# Patient Record
Sex: Male | Born: 1945 | Race: White | Hispanic: No | Marital: Married | Smoking: Former smoker
Health system: Southern US, Community
[De-identification: ages and names within clinical notes are randomized; demographics above are authoritative.]

## PROBLEM LIST (undated history)

## (undated) DIAGNOSIS — M1990 Unspecified osteoarthritis, unspecified site: Secondary | ICD-10-CM

## (undated) DIAGNOSIS — N529 Male erectile dysfunction, unspecified: Secondary | ICD-10-CM

## (undated) DIAGNOSIS — F419 Anxiety disorder, unspecified: Secondary | ICD-10-CM

## (undated) DIAGNOSIS — I340 Nonrheumatic mitral (valve) insufficiency: Secondary | ICD-10-CM

## (undated) DIAGNOSIS — K219 Gastro-esophageal reflux disease without esophagitis: Secondary | ICD-10-CM

## (undated) DIAGNOSIS — J189 Pneumonia, unspecified organism: Secondary | ICD-10-CM

## (undated) DIAGNOSIS — D126 Benign neoplasm of colon, unspecified: Secondary | ICD-10-CM

## (undated) DIAGNOSIS — J939 Pneumothorax, unspecified: Secondary | ICD-10-CM

## (undated) DIAGNOSIS — I2510 Atherosclerotic heart disease of native coronary artery without angina pectoris: Secondary | ICD-10-CM

## (undated) DIAGNOSIS — Z9889 Other specified postprocedural states: Secondary | ICD-10-CM

## (undated) DIAGNOSIS — J449 Chronic obstructive pulmonary disease, unspecified: Secondary | ICD-10-CM

## (undated) DIAGNOSIS — G2581 Restless legs syndrome: Secondary | ICD-10-CM

## (undated) DIAGNOSIS — R112 Nausea with vomiting, unspecified: Secondary | ICD-10-CM

## (undated) DIAGNOSIS — E785 Hyperlipidemia, unspecified: Secondary | ICD-10-CM

## (undated) DIAGNOSIS — F4310 Post-traumatic stress disorder, unspecified: Secondary | ICD-10-CM

## (undated) DIAGNOSIS — F32A Depression, unspecified: Secondary | ICD-10-CM

## (undated) DIAGNOSIS — G4733 Obstructive sleep apnea (adult) (pediatric): Secondary | ICD-10-CM

## (undated) DIAGNOSIS — R972 Elevated prostate specific antigen [PSA]: Secondary | ICD-10-CM

## (undated) DIAGNOSIS — I10 Essential (primary) hypertension: Secondary | ICD-10-CM

## (undated) HISTORY — PX: CHOLECYSTECTOMY: SHX55

## (undated) HISTORY — DX: Benign neoplasm of colon, unspecified: D12.6

## (undated) HISTORY — DX: Gastro-esophageal reflux disease without esophagitis: K21.9

## (undated) HISTORY — DX: Restless legs syndrome: G25.81

## (undated) HISTORY — PX: MOHS SURGERY: SHX181

## (undated) HISTORY — DX: Male erectile dysfunction, unspecified: N52.9

## (undated) HISTORY — DX: Atherosclerotic heart disease of native coronary artery without angina pectoris: I25.10

## (undated) HISTORY — DX: Elevated prostate specific antigen (PSA): R97.20

## (undated) HISTORY — DX: Obstructive sleep apnea (adult) (pediatric): G47.33

## (undated) HISTORY — DX: Pneumothorax, unspecified: J93.9

## (undated) HISTORY — DX: Nonrheumatic mitral (valve) insufficiency: I34.0

## (undated) HISTORY — PX: OTHER SURGICAL HISTORY: SHX169

## (undated) HISTORY — DX: Essential (primary) hypertension: I10

## (undated) HISTORY — PX: ANKLE SURGERY: SHX546

## (undated) HISTORY — DX: Hyperlipidemia, unspecified: E78.5

---

## 1998-12-02 ENCOUNTER — Ambulatory Visit (HOSPITAL_BASED_OUTPATIENT_CLINIC_OR_DEPARTMENT_OTHER): Admission: RE | Admit: 1998-12-02 | Discharge: 1998-12-02 | Payer: Self-pay | Admitting: Urology

## 1999-02-16 ENCOUNTER — Ambulatory Visit (HOSPITAL_COMMUNITY): Admission: RE | Admit: 1999-02-16 | Discharge: 1999-02-16 | Payer: Self-pay | Admitting: Cardiovascular Disease

## 2002-09-02 ENCOUNTER — Ambulatory Visit (HOSPITAL_COMMUNITY): Admission: RE | Admit: 2002-09-02 | Discharge: 2002-09-02 | Payer: Self-pay | Admitting: Gastroenterology

## 2002-09-03 ENCOUNTER — Encounter: Payer: Self-pay | Admitting: Gastroenterology

## 2002-09-03 ENCOUNTER — Encounter: Admission: RE | Admit: 2002-09-03 | Discharge: 2002-09-03 | Payer: Self-pay | Admitting: Gastroenterology

## 2003-11-16 ENCOUNTER — Ambulatory Visit (HOSPITAL_COMMUNITY): Admission: RE | Admit: 2003-11-16 | Discharge: 2003-11-16 | Payer: Self-pay | Admitting: Orthopedic Surgery

## 2006-01-15 ENCOUNTER — Emergency Department (HOSPITAL_COMMUNITY): Admission: EM | Admit: 2006-01-15 | Discharge: 2006-01-16 | Payer: Self-pay | Admitting: Emergency Medicine

## 2006-07-19 ENCOUNTER — Observation Stay (HOSPITAL_COMMUNITY): Admission: EM | Admit: 2006-07-19 | Discharge: 2006-07-20 | Payer: Self-pay | Admitting: *Deleted

## 2011-12-13 ENCOUNTER — Other Ambulatory Visit: Payer: Self-pay | Admitting: Family Medicine

## 2011-12-13 DIAGNOSIS — R9389 Abnormal findings on diagnostic imaging of other specified body structures: Secondary | ICD-10-CM

## 2011-12-14 ENCOUNTER — Ambulatory Visit
Admission: RE | Admit: 2011-12-14 | Discharge: 2011-12-14 | Disposition: A | Discharge status: Home / Self Care | Payer: Medicare Other | Source: Ambulatory Visit | Attending: Family Medicine | Admitting: Family Medicine

## 2011-12-14 DIAGNOSIS — R9389 Abnormal findings on diagnostic imaging of other specified body structures: Secondary | ICD-10-CM

## 2011-12-14 DIAGNOSIS — R918 Other nonspecific abnormal finding of lung field: Secondary | ICD-10-CM | POA: Diagnosis not present

## 2011-12-14 DIAGNOSIS — J984 Other disorders of lung: Secondary | ICD-10-CM | POA: Diagnosis not present

## 2011-12-14 DIAGNOSIS — R599 Enlarged lymph nodes, unspecified: Secondary | ICD-10-CM | POA: Diagnosis not present

## 2012-01-29 DIAGNOSIS — R05 Cough: Secondary | ICD-10-CM | POA: Diagnosis not present

## 2012-01-30 DIAGNOSIS — N401 Enlarged prostate with lower urinary tract symptoms: Secondary | ICD-10-CM | POA: Diagnosis not present

## 2012-01-30 DIAGNOSIS — R972 Elevated prostate specific antigen [PSA]: Secondary | ICD-10-CM | POA: Diagnosis not present

## 2012-01-30 DIAGNOSIS — N529 Male erectile dysfunction, unspecified: Secondary | ICD-10-CM | POA: Diagnosis not present

## 2012-02-26 ENCOUNTER — Encounter: Payer: Self-pay | Admitting: Pulmonary Disease

## 2012-02-26 DIAGNOSIS — I10 Essential (primary) hypertension: Secondary | ICD-10-CM

## 2012-02-26 DIAGNOSIS — I340 Nonrheumatic mitral (valve) insufficiency: Secondary | ICD-10-CM | POA: Insufficient documentation

## 2012-02-26 DIAGNOSIS — I2510 Atherosclerotic heart disease of native coronary artery without angina pectoris: Secondary | ICD-10-CM | POA: Insufficient documentation

## 2012-02-26 DIAGNOSIS — E785 Hyperlipidemia, unspecified: Secondary | ICD-10-CM | POA: Insufficient documentation

## 2012-02-26 DIAGNOSIS — N529 Male erectile dysfunction, unspecified: Secondary | ICD-10-CM | POA: Insufficient documentation

## 2012-02-26 DIAGNOSIS — K219 Gastro-esophageal reflux disease without esophagitis: Secondary | ICD-10-CM | POA: Insufficient documentation

## 2012-02-26 HISTORY — DX: Essential (primary) hypertension: I10

## 2012-02-27 ENCOUNTER — Ambulatory Visit (INDEPENDENT_AMBULATORY_CARE_PROVIDER_SITE_OTHER): Payer: Medicare Other | Admitting: Pulmonary Disease

## 2012-02-27 ENCOUNTER — Encounter: Payer: Self-pay | Admitting: Pulmonary Disease

## 2012-02-27 VITALS — BP 116/70 | HR 61 | Temp 98.0°F | Ht 75.0 in | Wt 252.6 lb

## 2012-02-27 DIAGNOSIS — R9389 Abnormal findings on diagnostic imaging of other specified body structures: Secondary | ICD-10-CM | POA: Diagnosis not present

## 2012-02-27 DIAGNOSIS — I2510 Atherosclerotic heart disease of native coronary artery without angina pectoris: Secondary | ICD-10-CM | POA: Insufficient documentation

## 2012-02-27 DIAGNOSIS — R05 Cough: Secondary | ICD-10-CM | POA: Diagnosis not present

## 2012-02-27 DIAGNOSIS — R931 Abnormal findings on diagnostic imaging of heart and coronary circulation: Secondary | ICD-10-CM | POA: Insufficient documentation

## 2012-02-27 DIAGNOSIS — R053 Chronic cough: Secondary | ICD-10-CM | POA: Insufficient documentation

## 2012-02-27 NOTE — Patient Instructions (Signed)
Will schedule for followup ct chest in 3 weeks.  Will call you with results. Continue on chlorpheniramine 8mg  at bedtime and 4mg  at lunch for postnasal drip Stay on medication for reflux twice a day.  If the cough re-escalates, may need to evaluate sinuses and esophagus. Go ahead and schedule followup visit at your convenience to re-establish for your sleep apnea and restless legs.

## 2012-02-27 NOTE — Progress Notes (Signed)
  Subjective:    Patient ID: Gregory Suarez, male    DOB: [birth year], 66 y.o.   MRN: [id number]  HPI The patient is a 66 year old male who I then asked to see for an abnormal CT chest.  The patient has a history of chronic cough for one year, with a history that is very suggestive of postnasal drip and reflux disease.  He has been treated for these entities, and feels that his cough is better now than it has ever been.  He currently rates his cough a 2/10.  Patient currently has minimal mucus, and feels that it is coming from the back of his throat.  He denies any chest congestion or chest discomfort, and has had no hemoptysis.  He is eating well, and has had no weight loss.  He does have a history of reflux disease with regurgitation of food at night, but it is currently controlled on medications.  He denies any dysphasia.  He has seen no change in his breathing or exercise tolerance.  The patient did recently undergo a CT chest that shows very mild lymphadenopathy, as well as small patches of  basilar airspace disease medially in the lower lobes that appears to be more scarlike and possibly related to bronchiectasis.   Review of Systems  Constitutional: Negative.  Negative for fever and unexpected weight change.  HENT: Positive for ear pain, congestion and sneezing. Negative for nosebleeds, sore throat, rhinorrhea, trouble swallowing, dental problem, postnasal drip and sinus pressure.   Eyes: Negative.  Negative for redness and itching.  Respiratory: Positive for cough. Negative for chest tightness, shortness of breath and wheezing.   Cardiovascular: Positive for leg swelling. Negative for palpitations.  Gastrointestinal: Negative.  Negative for nausea and vomiting.  Genitourinary: Negative.  Negative for dysuria.  Musculoskeletal: Positive for arthralgias. Negative for joint swelling.  Skin: Positive for rash.  Neurological: Negative.  Negative for headaches.  Hematological: Negative.  Does not  bruise/bleed easily.  Psychiatric/Behavioral: Negative.  Negative for dysphoric mood. The patient is not nervous/anxious.        Objective:   Physical Exam Constitutional:  Well developed, no acute distress  HENT:  Nares patent without discharge  Oropharynx without exudate, palate and uvula are elongated  Eyes:  Perrla, eomi, no scleral icterus  Neck:  No JVD, no TMG  Cardiovascular:  Normal rate, regular rhythm, no rubs or gallops.  No murmurs        Intact distal pulses  Pulmonary :  Normal breath sounds, no stridor or respiratory distress   No rales, rhonchi, or wheezing  Abdominal:  Soft, nondistended, bowel sounds present.  No tenderness noted.   Musculoskeletal: 1+ lower extremity edema noted.  Lymph Nodes:  No cervical lymphadenopathy noted  Skin:  No cyanosis noted  Neurologic:  Alert, appropriate, moves all 4 extremities without obvious deficit.         Assessment & Plan:

## 2012-02-27 NOTE — Assessment & Plan Note (Signed)
The patient has a chronic cough that is most consistent with postnasal drip and reflux disease based on his history.  He feels that it is at least 80% improved on an antihistamine and b.i.d. Proton pump inhibitor, and I have asked him to continue on this.  If his cough escalates again, he may need further GI evaluation and possibly a CT of his sinuses to rule out chronic sinusitis.

## 2012-02-27 NOTE — Assessment & Plan Note (Signed)
The patient's chest CT shows minimal lymphadenopathy, and also small areas of air space disease deep in both bases medially.  This appears to be more scarring and possibly bronchiectasis than an active ongoing infection.  Chronic reflux/regurgitation could possibly lead to this process.  The patient denies any history for aspiration while eating or drinking, and has no swallowing difficulties.  At this point, I would not treat him any differently, and would do a followup CT scan at a 3 month interval from his prior scan.

## 2012-03-21 ENCOUNTER — Ambulatory Visit (INDEPENDENT_AMBULATORY_CARE_PROVIDER_SITE_OTHER)
Admission: RE | Admit: 2012-03-21 | Discharge: 2012-03-21 | Disposition: A | Discharge status: Home / Self Care | Payer: Medicare Other | Source: Ambulatory Visit | Attending: Pulmonary Disease | Admitting: Pulmonary Disease

## 2012-03-21 DIAGNOSIS — J984 Other disorders of lung: Secondary | ICD-10-CM | POA: Diagnosis not present

## 2012-03-21 DIAGNOSIS — R9389 Abnormal findings on diagnostic imaging of other specified body structures: Secondary | ICD-10-CM

## 2012-03-21 DIAGNOSIS — R918 Other nonspecific abnormal finding of lung field: Secondary | ICD-10-CM | POA: Diagnosis not present

## 2012-03-24 ENCOUNTER — Other Ambulatory Visit: Payer: Self-pay | Admitting: Pulmonary Disease

## 2012-03-24 DIAGNOSIS — R9389 Abnormal findings on diagnostic imaging of other specified body structures: Secondary | ICD-10-CM

## 2012-04-02 ENCOUNTER — Telehealth: Payer: Self-pay | Admitting: Pulmonary Disease

## 2012-04-02 NOTE — Telephone Encounter (Signed)
I spoke with spouse and is aware of results she voiced her understanding and had no questions. CT has been scheduled for 09/22/12 at 10:00

## 2012-04-02 NOTE — Telephone Encounter (Signed)
Notes Recorded by Barbaraann Share, MD on 03/24/2012 at 9:30 AM Please let pt know that his minimally enlarged lymph nodes are completely stable. His abnormalities in the bottom of both lungs have resolved. Good news. However, he now has a spot in the bottom of the left lung that is the size of 2 BB's together. This is probably scarring from his prior abnormality in this area, but it may have been the abnormality hiding it all along. He needs a followup ct chest in 6mos. I will put the order in. Let me know if any questions.   [id number] x1 for pt

## 2012-06-06 DIAGNOSIS — R609 Edema, unspecified: Secondary | ICD-10-CM | POA: Diagnosis not present

## 2012-06-06 DIAGNOSIS — E782 Mixed hyperlipidemia: Secondary | ICD-10-CM | POA: Diagnosis not present

## 2012-06-06 DIAGNOSIS — I2510 Atherosclerotic heart disease of native coronary artery without angina pectoris: Secondary | ICD-10-CM | POA: Diagnosis not present

## 2012-06-24 DIAGNOSIS — H2510 Age-related nuclear cataract, unspecified eye: Secondary | ICD-10-CM | POA: Diagnosis not present

## 2012-06-24 DIAGNOSIS — H35369 Drusen (degenerative) of macula, unspecified eye: Secondary | ICD-10-CM | POA: Diagnosis not present

## 2012-06-24 DIAGNOSIS — H35429 Microcystoid degeneration of retina, unspecified eye: Secondary | ICD-10-CM | POA: Diagnosis not present

## 2012-06-24 DIAGNOSIS — H33109 Unspecified retinoschisis, unspecified eye: Secondary | ICD-10-CM | POA: Diagnosis not present

## 2012-07-14 DIAGNOSIS — E782 Mixed hyperlipidemia: Secondary | ICD-10-CM | POA: Diagnosis not present

## 2012-07-14 DIAGNOSIS — I259 Chronic ischemic heart disease, unspecified: Secondary | ICD-10-CM | POA: Diagnosis not present

## 2012-07-14 DIAGNOSIS — I10 Essential (primary) hypertension: Secondary | ICD-10-CM | POA: Diagnosis not present

## 2012-07-22 DIAGNOSIS — E785 Hyperlipidemia, unspecified: Secondary | ICD-10-CM | POA: Diagnosis not present

## 2012-07-22 DIAGNOSIS — I10 Essential (primary) hypertension: Secondary | ICD-10-CM | POA: Diagnosis not present

## 2012-07-22 DIAGNOSIS — Z23 Encounter for immunization: Secondary | ICD-10-CM | POA: Diagnosis not present

## 2012-09-16 DIAGNOSIS — L821 Other seborrheic keratosis: Secondary | ICD-10-CM | POA: Diagnosis not present

## 2012-09-16 DIAGNOSIS — L301 Dyshidrosis [pompholyx]: Secondary | ICD-10-CM | POA: Diagnosis not present

## 2012-09-16 DIAGNOSIS — L820 Inflamed seborrheic keratosis: Secondary | ICD-10-CM | POA: Diagnosis not present

## 2012-09-16 DIAGNOSIS — L570 Actinic keratosis: Secondary | ICD-10-CM | POA: Diagnosis not present

## 2012-09-16 DIAGNOSIS — D1800 Hemangioma unspecified site: Secondary | ICD-10-CM | POA: Diagnosis not present

## 2012-09-16 DIAGNOSIS — L259 Unspecified contact dermatitis, unspecified cause: Secondary | ICD-10-CM | POA: Diagnosis not present

## 2012-09-22 ENCOUNTER — Ambulatory Visit (INDEPENDENT_AMBULATORY_CARE_PROVIDER_SITE_OTHER)
Admission: RE | Admit: 2012-09-22 | Discharge: 2012-09-22 | Disposition: A | Discharge status: Home / Self Care | Payer: Medicare Other | Source: Ambulatory Visit | Attending: Pulmonary Disease | Admitting: Pulmonary Disease

## 2012-09-22 DIAGNOSIS — R9389 Abnormal findings on diagnostic imaging of other specified body structures: Secondary | ICD-10-CM

## 2012-09-22 DIAGNOSIS — J984 Other disorders of lung: Secondary | ICD-10-CM | POA: Diagnosis not present

## 2012-09-22 DIAGNOSIS — J438 Other emphysema: Secondary | ICD-10-CM | POA: Diagnosis not present

## 2013-02-16 ENCOUNTER — Telehealth: Payer: Self-pay | Admitting: Family Medicine

## 2013-02-17 MED ORDER — PANTOPRAZOLE SODIUM 40 MG PO TBEC: 40.0000 mg | DELAYED_RELEASE_TABLET | Freq: Two times a day (BID) | ORAL | Status: DC

## 2013-02-17 NOTE — Telephone Encounter (Signed)
Med rf. Pt needs to be seen before next rf

## 2013-03-05 ENCOUNTER — Encounter: Payer: Self-pay | Admitting: Family Medicine

## 2013-03-05 ENCOUNTER — Ambulatory Visit (INDEPENDENT_AMBULATORY_CARE_PROVIDER_SITE_OTHER): Payer: Medicare Other | Admitting: Family Medicine

## 2013-03-05 VITALS — BP 100/70 | HR 60 | Temp 97.8°F | Resp 18 | Ht 75.0 in | Wt 240.0 lb

## 2013-03-05 DIAGNOSIS — R05 Cough: Secondary | ICD-10-CM | POA: Diagnosis not present

## 2013-03-05 DIAGNOSIS — C61 Malignant neoplasm of prostate: Secondary | ICD-10-CM | POA: Diagnosis not present

## 2013-03-05 DIAGNOSIS — E785 Hyperlipidemia, unspecified: Secondary | ICD-10-CM

## 2013-03-05 DIAGNOSIS — G2581 Restless legs syndrome: Secondary | ICD-10-CM

## 2013-03-05 DIAGNOSIS — R059 Cough, unspecified: Secondary | ICD-10-CM

## 2013-03-05 DIAGNOSIS — I10 Essential (primary) hypertension: Secondary | ICD-10-CM

## 2013-03-05 LAB — CBC WITH DIFFERENTIAL/PLATELET
Basophils Absolute: 0 10*3/uL (ref 0.0–0.1)
Basophils Relative: 0 % (ref 0–1)
Eosinophils Absolute: 0.3 10*3/uL (ref 0.0–0.7)
Eosinophils Relative: 4 % (ref 0–5)
HCT: 43.5 % (ref 39.0–52.0)
Hemoglobin: 14.9 g/dL (ref 13.0–17.0)
Lymphocytes Relative: 19 % (ref 12–46)
Lymphs Abs: 1.4 10*3/uL (ref 0.7–4.0)
MCH: 30.2 pg (ref 26.0–34.0)
MCHC: 34.3 g/dL (ref 30.0–36.0)
MCV: 88.2 fL (ref 78.0–100.0)
Monocytes Absolute: 0.7 10*3/uL (ref 0.1–1.0)
Monocytes Relative: 10 % (ref 3–12)
Neutro Abs: 4.8 10*3/uL (ref 1.7–7.7)
Neutrophils Relative %: 67 % (ref 43–77)
Platelets: 163 10*3/uL (ref 150–400)
RBC: 4.93 MIL/uL (ref 4.22–5.81)
RDW: 14.2 % (ref 11.5–15.5)
WBC: 7.2 10*3/uL (ref 4.0–10.5)

## 2013-03-05 LAB — COMPLETE METABOLIC PANEL WITH GFR
ALT: 22 U/L (ref 0–53)
AST: 24 U/L (ref 0–37)
Albumin: 3.8 g/dL (ref 3.5–5.2)
Alkaline Phosphatase: 113 U/L (ref 39–117)
BUN: 24 mg/dL — ABNORMAL HIGH (ref 6–23)
CO2: 27 mEq/L (ref 19–32)
Calcium: 9.2 mg/dL (ref 8.4–10.5)
Chloride: 107 mEq/L (ref 96–112)
Creat: 0.97 mg/dL (ref 0.50–1.35)
GFR, Est African American: >89 mL/min
GFR, Est Non African American: 81 mL/min
Glucose, Bld: 88 mg/dL (ref 70–99)
Potassium: 4.3 mEq/L (ref 3.5–5.3)
Sodium: 142 mEq/L (ref 135–145)
Total Bilirubin: 0.9 mg/dL (ref 0.3–1.2)
Total Protein: 6.3 g/dL (ref 6.0–8.3)

## 2013-03-05 LAB — LIPID PANEL
Cholesterol: 86 mg/dL (ref 0–200)
HDL: 34 mg/dL — ABNORMAL LOW (ref >39)
LDL Cholesterol: 36 mg/dL (ref 0–99)
Total CHOL/HDL Ratio: 2.5 Ratio
Triglycerides: 79 mg/dL (ref <150)
VLDL: 16 mg/dL (ref 0–40)

## 2013-03-05 LAB — PSA, MEDICARE: PSA: 1.13 ng/mL (ref <=4.00)

## 2013-03-05 MED ORDER — PANTOPRAZOLE SODIUM 40 MG PO TBEC: 40.0000 mg | DELAYED_RELEASE_TABLET | Freq: Two times a day (BID) | ORAL | Status: DC

## 2013-03-05 MED ORDER — METOPROLOL SUCCINATE ER 50 MG PO TB24: 50.0000 mg | ORAL_TABLET | Freq: Every day | ORAL | Status: DC

## 2013-03-05 NOTE — Addendum Note (Signed)
Addended by: WRAY, [place] on: 03/05/2013 09:02 AM   Modules accepted: Orders

## 2013-03-05 NOTE — Progress Notes (Signed)
Subjective:    Patient ID: Gregory Suarez, male    DOB: 19-May-1946, 67 y.o.   MRN: [id number]  HPI Patient is here for followup of his medical problems. Prominent on his hypertension. He is currently taking Toprol-XL 50 mg by mouth daily he denies any chest pain or shortness of breath. He denies any dyspnea on exertion. He also has a history of hyperlipidemia. Currently taking Lipitor 40 mg by mouth daily. He is overdue for fasting lipid panel. He denies any myalgias right upper quadrant pain. He is currently taking protonic 40 mg by mouth twice a day due to chronic cough is due to laryngo-tracheal reflux.  His cough has been completely controlled with a combination of allergy medicines as well as PPIs. Since I last saw the patient he has been diagnosed with restless leg syndrome. He is only taking ropinirole 1 mg by mouth each bedtime. This seems to control his symptoms well. He does have some mild nausea associated with it. His colonoscopy is up-to-date. He is no longer seeing his urologist. Therefore his PSA is due. Past Medical History  Diagnosis Date  . Hypertension   . Hyperlipidemia   . GERD (gastroesophageal reflux disease)   . CAD (coronary artery disease)   . Erectile dysfunction   . Mitral valve regurgitation   . Elevated PSA   . RLS (restless legs syndrome)    Current Outpatient Prescriptions on File Prior to Visit  Medication Sig Dispense Refill  . aspirin 325 MG tablet Take 325 mg by mouth daily.      Marland Kitchen atorvastatin (LIPITOR) 40 MG tablet Take 40 mg by mouth daily.      . chlorpheniramine (ALLERGY) 4 MG tablet Take 4 mg by mouth daily as needed.      . metoprolol (LOPRESSOR) 50 MG tablet Take 50 mg by mouth daily.      . Probiotic Product (PROBIOTIC FORMULA PO) Take 1 tablet by mouth daily.      . tadalafil (CIALIS) 5 MG tablet Take 5 mg by mouth daily.      . nitroGLYCERIN (NITROSTAT) 0.4 MG SL tablet Place 0.4 mg under the tongue every 5 (five) minutes as needed.       No  current facility-administered medications on file prior to visit.   No Known Allergies History   Social History  . Marital Status: Married    Spouse Name: N/A    Number of Children: N/A  . Years of Education: N/A   Occupational History  . RETIRED    Social History Main Topics  . Smoking status: Former Smoker -- 0.50 packs/day for 15 years    Types: Cigarettes    Quit date: 10/01/1988  . Smokeless tobacco: Not on file  . Alcohol Use: No  . Drug Use: No  . Sexually Active: Not on file   Other Topics Concern  . Not on file   Social History Narrative  . No narrative on file      Review of Systems  All other systems reviewed and are negative.       Objective:   Physical Exam  Vitals reviewed. Neck: Neck supple. No thyromegaly present.  Cardiovascular: Normal rate, regular rhythm, normal heart sounds and intact distal pulses.   No murmur heard. Pulmonary/Chest: Effort normal and breath sounds normal. No respiratory distress. He has no wheezes. He has no rales. He exhibits no tenderness.  Abdominal: Soft. Bowel sounds are normal. He exhibits no distension. There is no tenderness. There is no  rebound and no guarding.  Lymphadenopathy:    He has no cervical adenopathy.          Assessment & Plan:  1. HTN (hypertension) Blood pressure is well controlled. Continue current medications at current dose. - COMPLETE METABOLIC PANEL WITH GFR - Lipid panel - CBC with Differential  2. Prostate cancer The patient does not have a history of prostate cancer however we are due to check his screening PSA. He does have a history of elevated PSAs in the past.  3. Hyperlipidemia Given his history of coronary artery disease, his goal LDL is less than 70. Check a fasting lipid panel.  4. Chronic cough Continue protonic 40 mg by mouth twice a day.  Cough is currently well controlled.  5. Hypertension  6. RLS (restless legs syndrome) Continue ropinirole 1 mg by mouth q.  HS.

## 2013-06-21 ENCOUNTER — Telehealth: Payer: Self-pay | Admitting: *Deleted

## 2013-06-21 NOTE — Telephone Encounter (Signed)
Faxed CPAP supply order back. 

## 2013-07-01 ENCOUNTER — Other Ambulatory Visit: Payer: Self-pay | Admitting: Family Medicine

## 2013-07-14 ENCOUNTER — Ambulatory Visit: Payer: Medicare Other | Admitting: Cardiovascular Disease

## 2013-07-27 ENCOUNTER — Telehealth: Payer: Self-pay | Admitting: *Deleted

## 2013-07-27 NOTE — Telephone Encounter (Signed)
Faxed signed prescription for replacement humidifier chamber.

## 2013-08-10 ENCOUNTER — Ambulatory Visit: Payer: Medicare Other | Admitting: Cardiovascular Disease

## 2013-08-31 ENCOUNTER — Other Ambulatory Visit: Payer: Self-pay | Admitting: Family Medicine

## 2013-09-15 ENCOUNTER — Telehealth: Payer: Self-pay | Admitting: Family Medicine

## 2013-09-15 NOTE — Telephone Encounter (Signed)
Pt wife called and left a voicemail early this morning stating that he is needing a refill on his Cialis. He is in [place] and is needing a refill, she states that it helps him pee. Call back number is [phone]

## 2013-09-16 MED ORDER — TADALAFIL 5 MG PO TABS: 5.0000 mg | ORAL_TABLET | Freq: Every day | ORAL | Status: DC

## 2013-09-16 NOTE — Telephone Encounter (Signed)
Rx Refilled  

## 2013-09-21 ENCOUNTER — Other Ambulatory Visit: Payer: Self-pay | Admitting: Family Medicine

## 2014-01-05 ENCOUNTER — Other Ambulatory Visit: Payer: Self-pay | Admitting: Family Medicine

## 2014-01-18 ENCOUNTER — Other Ambulatory Visit: Payer: Medicare Other

## 2014-01-18 DIAGNOSIS — Z79899 Other long term (current) drug therapy: Secondary | ICD-10-CM | POA: Diagnosis not present

## 2014-01-18 DIAGNOSIS — E785 Hyperlipidemia, unspecified: Secondary | ICD-10-CM | POA: Diagnosis not present

## 2014-01-18 DIAGNOSIS — Z0000 Encounter for general adult medical examination without abnormal findings: Secondary | ICD-10-CM | POA: Diagnosis not present

## 2014-01-18 DIAGNOSIS — I25810 Atherosclerosis of coronary artery bypass graft(s) without angina pectoris: Secondary | ICD-10-CM

## 2014-01-18 DIAGNOSIS — I10 Essential (primary) hypertension: Secondary | ICD-10-CM

## 2014-01-18 LAB — CBC WITH DIFFERENTIAL/PLATELET
Basophils Absolute: 0.1 10*3/uL (ref 0.0–0.1)
Basophils Relative: 1 % (ref 0–1)
Eosinophils Absolute: 0.3 10*3/uL (ref 0.0–0.7)
Eosinophils Relative: 4 % (ref 0–5)
HCT: 44.2 % (ref 39.0–52.0)
Hemoglobin: 15.1 g/dL (ref 13.0–17.0)
Lymphocytes Relative: 21 % (ref 12–46)
Lymphs Abs: 1.4 10*3/uL (ref 0.7–4.0)
MCH: 29.3 pg (ref 26.0–34.0)
MCHC: 34.2 g/dL (ref 30.0–36.0)
MCV: 85.8 fL (ref 78.0–100.0)
Monocytes Absolute: 0.5 10*3/uL (ref 0.1–1.0)
Monocytes Relative: 8 % (ref 3–12)
Neutro Abs: 4.4 10*3/uL (ref 1.7–7.7)
Neutrophils Relative %: 66 % (ref 43–77)
Platelets: 177 10*3/uL (ref 150–400)
RBC: 5.15 MIL/uL (ref 4.22–5.81)
RDW: 14.8 % (ref 11.5–15.5)
WBC: 6.6 10*3/uL (ref 4.0–10.5)

## 2014-01-18 LAB — COMPREHENSIVE METABOLIC PANEL
ALT: 18 U/L (ref 0–53)
AST: 20 U/L (ref 0–37)
Albumin: 3.6 g/dL (ref 3.5–5.2)
Alkaline Phosphatase: 111 U/L (ref 39–117)
BUN: 18 mg/dL (ref 6–23)
CO2: 28 mEq/L (ref 19–32)
Calcium: 8.8 mg/dL (ref 8.4–10.5)
Chloride: 105 mEq/L (ref 96–112)
Creat: 0.91 mg/dL (ref 0.50–1.35)
Glucose, Bld: 86 mg/dL (ref 70–99)
Potassium: 4.5 mEq/L (ref 3.5–5.3)
Sodium: 142 mEq/L (ref 135–145)
Total Bilirubin: 0.6 mg/dL (ref 0.2–1.2)
Total Protein: 6.5 g/dL (ref 6.0–8.3)

## 2014-01-18 LAB — LIPID PANEL
Cholesterol: 107 mg/dL (ref 0–200)
HDL: 36 mg/dL — ABNORMAL LOW (ref >39)
LDL Cholesterol: 52 mg/dL (ref 0–99)
Total CHOL/HDL Ratio: 3 Ratio
Triglycerides: 96 mg/dL (ref <150)
VLDL: 19 mg/dL (ref 0–40)

## 2014-01-18 LAB — TSH: TSH: 2.106 u[IU]/mL (ref 0.350–4.500)

## 2014-01-21 ENCOUNTER — Encounter: Payer: Self-pay | Admitting: Family Medicine

## 2014-01-21 ENCOUNTER — Ambulatory Visit (INDEPENDENT_AMBULATORY_CARE_PROVIDER_SITE_OTHER): Payer: Medicare Other | Admitting: Family Medicine

## 2014-01-21 VITALS — BP 106/62 | HR 76 | Temp 97.7°F | Resp 18 | Ht 75.0 in | Wt 254.0 lb

## 2014-01-21 DIAGNOSIS — M549 Dorsalgia, unspecified: Secondary | ICD-10-CM | POA: Diagnosis not present

## 2014-01-21 DIAGNOSIS — Z0000 Encounter for general adult medical examination without abnormal findings: Secondary | ICD-10-CM

## 2014-01-21 MED ORDER — CYCLOBENZAPRINE HCL 10 MG PO TABS: 10.0000 mg | ORAL_TABLET | Freq: Three times a day (TID) | ORAL | Status: DC | PRN

## 2014-01-21 MED ORDER — PREDNISONE 20 MG PO TABS: ORAL_TABLET | ORAL | Status: DC

## 2014-01-21 MED ORDER — TIZANIDINE HCL 4 MG PO TABS: 4.0000 mg | ORAL_TABLET | Freq: Three times a day (TID) | ORAL | Status: DC | PRN

## 2014-01-21 NOTE — Progress Notes (Signed)
Subjective:    Patient ID: Gregory Suarez, male    DOB: 10/13/1945, 68 y.o.   MRN: [id number]  HPI Patient is here today for his Medicare physical.  He also complains of low back pain in the center of his back. He states he twisted his back a few weeks ago running the back of his car. Ever since he had a dark constant ache and stiffness in his lower back. He also occasionally has burning pain radiating into his right posterior thigh. He denies any weakness in his legs. He is extremely stiff today on examination. He was tried, and abdomen with minimal relief. He has no symptoms of cauda equina syndrome. Subjective:   Patient presents for Medicare Annual/Subsequent preventive examination.   Review Past Medical/Family/Social: Past Medical History  Diagnosis Date  . Hypertension   . Hyperlipidemia   . GERD (gastroesophageal reflux disease)   . CAD (coronary artery disease)   . Erectile dysfunction   . Mitral valve regurgitation   . Elevated PSA   . RLS (restless legs syndrome)    Past Surgical History  Procedure Laterality Date  . Ankle surgery    . Cholecystectomy    . Cholecystectomy     Current Outpatient Prescriptions on File Prior to Visit  Medication Sig Dispense Refill  . aspirin 325 MG tablet Take 325 mg by mouth daily.      Marland Kitchen atorvastatin (LIPITOR) 40 MG tablet TAKE 1 TABLET BY MOUTH EVERY NIGHT AT BEDTIME  90 tablet  1  . chlorpheniramine (ALLERGY) 4 MG tablet Take 4 mg by mouth daily as needed.      . metoprolol succinate (TOPROL-XL) 50 MG 24 hr tablet Take 1 tablet (50 mg total) by mouth daily. Take with or immediately following a meal.  90 tablet  3  . nitroGLYCERIN (NITROSTAT) 0.4 MG SL tablet Place 0.4 mg under the tongue every 5 (five) minutes as needed.      . pantoprazole (PROTONIX) 40 MG tablet Take 1 tablet (40 mg total) by mouth 2 (two) times daily.  180 tablet  3  . Probiotic Product (PROBIOTIC FORMULA PO) Take 1 tablet by mouth daily.      Marland Kitchen rOPINIRole (REQUIP)  1 MG tablet TAKE 1 TABLET BY MOUTH EVERY NIGHT AT BEDTIME  90 tablet  3  . tadalafil (CIALIS) 5 MG tablet Take 1 tablet (5 mg total) by mouth daily.  90 tablet  4   No current facility-administered medications on file prior to visit.   No Known Allergies History   Social History  . Marital Status: Married    Spouse Name: N/A    Number of Children: N/A  . Years of Education: N/A   Occupational History  . RETIRED    Social History Main Topics  . Smoking status: Former Smoker -- 0.50 packs/day for 15 years    Types: Cigarettes    Quit date: 10/01/1988  . Smokeless tobacco: Not on file  . Alcohol Use: No  . Drug Use: No  . Sexual Activity: Yes     Comment: married, 2 grown sons, retired maintenance man   Other Topics Concern  . Not on file   Social History Narrative  . No narrative on file   Family History  Problem Relation Age of Onset  . Diabetes    . Breast cancer    . Coronary artery disease       Risk Factors  Current exercise habits: none, increase to 30 min day/5 days  a week. Dietary issues discussed: decrease carbs  Cardiac risk factors: Obesity (BMI >= 30 kg/m2).   Depression Screen  (Note: if answer to either of the following is "Yes", a more complete depression screening is indicated)  Over the past two weeks, have you felt down, depressed or hopeless? No Over the past two weeks, have you felt little interest or pleasure in doing things? No Have you lost interest or pleasure in daily life? No Do you often feel hopeless? No Do you cry easily over simple problems? No   Activities of Daily Living  In your present state of health, do you have any difficulty performing the following activities?:  Driving? No  Managing money? No  Feeding yourself? No  Getting from bed to chair? No  Climbing a flight of stairs? No  Preparing food and eating?: No  Bathing or showering? No  Getting dressed: No  Getting to the toilet? No  Using the toilet:No  Moving  around from place to place: No  In the past year have you fallen or had a near fall?:No  Are you sexually active? Yes Do you have more than one partner? No   Hearing Difficulties: No  Do you often ask people to speak up or repeat themselves? No  Do you experience ringing or noises in your ears? No Do you have difficulty understanding soft or whispered voices? No  Do you feel that you have a problem with memory? No Do you often misplace items? No  Do you feel safe at home? Yes  Cognitive Testing  Alert? Yes Normal Appearance?Yes  Oriented to person? Yes Place? Yes  Time? Yes  Recall of three objects? Yes  Can perform simple calculations? Yes  Displays appropriate judgment?Yes  Can read the correct time from a watch face?Yes   List the Names of Other Physician/Practitioners you currently use:    Indicate any recent Medical Services you may have received from other than [facility] providers in the past year (date may be approximate).   Screening Tests / Date Colonoscopy   last colonoscopy was in 2008. It is not due again until 2018                  Zostavax patient has Zostavax in 20 Influenza Vaccine is due this fall Tetanus/tdap 2010  Patient has had Pneumovax 23 and Prevnar 13 through the [place]     Assessment:    Annual wellness medicare exam   Plan:    During the course of the visit the patient was educated and counseled about appropriate screening and preventive services including:  Screening mammography  Colorectal cancer screening  Shingles vaccine. Prescription given to that she can get the vaccine at the pharmacy or Medicare part D.  Screen + for depression. PHQ- 9 score of 12 (moderate depression). We discussed the options of counseling versus possibly a medication. I encouraged her strongly think about the counseling. She is going through some medical problems currently and her husband is as well Mrs. been very stressful for her. She says she will think about it. She  does have Xanax to use as needed. Though she may benefit from an SSRI for her more depressive type symptoms but she wants to hold off at this time.  I aksed her to please have her cardioloist send records since we have none on file.  Diet review for nutrition referral? Yes ____ Not Indicated __x__  Patient Instructions (the written plan) was given to the patient.  Medicare Attestation  I have personally reviewed:  The patient's medical and social history  Their use of alcohol, tobacco or illicit drugs  Their current medications and supplements  The patient's functional ability including ADLs,fall risks, home safety risks, cognitive, and hearing and visual impairment  Diet and physical activities  Evidence for depression or mood disorders  The patient's weight, height, BMI, and visual acuity have been recorded in the chart. I have made referrals, counseling, and provided education to the patient based on review of the above and I have provided the patient with a written personalized care plan for preventive services.        Review of Systems  All other systems reviewed and are negative.      Objective:   Physical Exam  Vitals reviewed. Constitutional: He is oriented to person, place, and time. He appears well-developed and well-nourished. No distress.  HENT:  Head: Normocephalic and atraumatic.  Right Ear: External ear normal.  Left Ear: External ear normal.  Nose: Nose normal.  Mouth/Throat: Oropharynx is clear and moist. No oropharyngeal exudate.  Eyes: Conjunctivae and EOM are normal. Pupils are equal, round, and reactive to light. Right eye exhibits no discharge. Left eye exhibits no discharge. No scleral icterus.  Neck: Normal range of motion. Neck supple. No JVD present. No tracheal deviation present. No thyromegaly present.  Cardiovascular: Normal rate, regular rhythm, normal heart sounds and intact distal pulses.  Exam reveals no gallop and no friction rub.   No murmur  heard. Pulmonary/Chest: Effort normal and breath sounds normal. No stridor. No respiratory distress. He has no wheezes. He has no rales. He exhibits no tenderness.  Abdominal: Soft. Bowel sounds are normal. He exhibits no distension and no mass. There is no tenderness. There is no rebound and no guarding.  Musculoskeletal: Normal range of motion. He exhibits tenderness. He exhibits no edema.  Lymphadenopathy:    He has no cervical adenopathy.  Neurological: He is alert and oriented to person, place, and time. He has normal reflexes. He displays normal reflexes. No cranial nerve deficit. He exhibits normal muscle tone. Coordination normal.  Skin: Skin is warm. No rash noted. He is not diaphoretic. No erythema. No pallor.  Psychiatric: He has a normal mood and affect. His behavior is normal. Judgment and thought content normal.   patient has decreased range of motion in his lumbar spine and has palpable muscle spasms in that area. He also has a 1 cm actinic keratosis on his right temple.        Assessment & Plan:  1. Routine general medical examination at a health care facility Physical exam is up to date. I recommended increasing aerobic exercise along with reduction in calories and carbohydrates his weight. I reviewed his most recent lab work which is listed below: Lab on 01/18/2014  Component Date Value Ref Range Status  . Cholesterol 01/18/2014 107  0 - 200 mg/dL Final   Comment: ATP III Classification:                                < 200        mg/dL        Desirable                               200 - 239     mg/dL  Borderline High                               >= 240        mg/dL        High                             . Triglycerides 01/18/2014 96  <150 mg/dL Final  . HDL 01/18/2014 36* >39 mg/dL Final  . Total CHOL/HDL Ratio 01/18/2014 3.0   Final  . VLDL 01/18/2014 19  0 - 40 mg/dL Final  . LDL Cholesterol 01/18/2014 52  0 - 99 mg/dL Final   Comment:                             Total Cholesterol/HDL Ratio:CHD Risk                                                 Coronary Heart Disease Risk Table                                                                 Men       Women                                   1/2 Average Risk              3.4        3.3                                       Average Risk              5.0        4.4                                    2X Average Risk              9.6        7.1                                    3X Average Risk             23.4       11.0                          Use the calculated Patient Ratio above and the CHD Risk table                           to determine the patient's CHD Risk.  ATP III Classification (LDL):                                < 100        mg/dL         Optimal                               100 - 129     mg/dL         Near or Above Optimal                               130 - 159     mg/dL         Borderline High                               160 - 189     mg/dL         High                                > 190        mg/dL         Very High                             . TSH 01/18/2014 2.106  0.350 - 4.500 uIU/mL Final  . WBC 01/18/2014 6.6  4.0 - 10.5 K/uL Final  . RBC 01/18/2014 5.15  4.22 - 5.81 MIL/uL Final  . Hemoglobin 01/18/2014 15.1  13.0 - 17.0 g/dL Final  . HCT 01/18/2014 44.2  39.0 - 52.0 % Final  . MCV 01/18/2014 85.8  78.0 - 100.0 fL Final  . MCH 01/18/2014 29.3  26.0 - 34.0 pg Final  . MCHC 01/18/2014 34.2  30.0 - 36.0 g/dL Final  . RDW 01/18/2014 14.8  11.5 - 15.5 % Final  . Platelets 01/18/2014 177  150 - 400 K/uL Final  . Neutrophils Relative % 01/18/2014 66  43 - 77 % Final  . Neutro Abs 01/18/2014 4.4  1.7 - 7.7 K/uL Final  . Lymphocytes Relative 01/18/2014 21  12 - 46 % Final  . Lymphs Abs 01/18/2014 1.4  0.7 - 4.0 K/uL Final  . Monocytes Relative 01/18/2014 8  3 - 12 % Final  . Monocytes Absolute 01/18/2014 0.5  0.1 - 1.0 K/uL Final  .  Eosinophils Relative 01/18/2014 4  0 - 5 % Final  . Eosinophils Absolute 01/18/2014 0.3  0.0 - 0.7 K/uL Final  . Basophils Relative 01/18/2014 1  0 - 1 % Final  . Basophils Absolute 01/18/2014 0.1  0.0 - 0.1 K/uL Final  . Smear Review 01/18/2014 Criteria for review not met   Final  . Sodium 01/18/2014 142  135 - 145 mEq/L Final  . Potassium 01/18/2014 4.5  3.5 - 5.3 mEq/L Final  . Chloride 01/18/2014 105  96 - 112 mEq/L Final  . CO2 01/18/2014 28  19 - 32 mEq/L Final  . Glucose, Bld 01/18/2014 86  70 - 99 mg/dL Final  . BUN 01/18/2014 18  6 - 23 mg/dL Final  . Creat 01/18/2014 0.91  0.50 - 1.35 mg/dL Final  .  Total Bilirubin 01/18/2014 0.6  0.2 - 1.2 mg/dL Final  . Alkaline Phosphatase 01/18/2014 111  39 - 117 U/L Final  . AST 01/18/2014 20  0 - 37 U/L Final  . ALT 01/18/2014 18  0 - 53 U/L Final  . Total Protein 01/18/2014 6.5  6.0 - 8.3 g/dL Final  . Albumin 01/18/2014 3.6  3.5 - 5.2 g/dL Final  . Calcium 01/18/2014 8.8  8.4 - 10.5 mg/dL Final   The only significant finding is low HDL which should be addressed by increasing his aerobic exercise. His immunizations are up to date. His cancer screening is up to date. He would be good for PSA after June 5.  He can return here for that  2. Back pain Patient may have a mild herniated this along with muscle spasms. He can take a prednisone taper pack of herniated disc. He can use Flexeril 5 mg to 10 mg every hours as needed for muscle spasm. - predniSONE (DELTASONE) 20 MG tablet; 3 tabs poqday 1-2, 2 tabs poqday 3-4, 1 tab poqday 5-6  Dispense: 12 tablet; Refill: 0

## 2014-02-23 ENCOUNTER — Other Ambulatory Visit: Payer: Self-pay | Admitting: Family Medicine

## 2014-02-23 NOTE — Telephone Encounter (Signed)
Refill appropriate and filled per protocol. 

## 2014-03-16 ENCOUNTER — Telehealth: Payer: Self-pay | Admitting: Family Medicine

## 2014-03-16 NOTE — Telephone Encounter (Signed)
Have him try Crestor 10mg , there has been less cramping and aches with this compared to lipitor, samples on your desk for him to try for 1 month and see how he does

## 2014-03-16 NOTE — Telephone Encounter (Signed)
Pt states that his legs feel better after stopping the cholesterol medication and wants to know what you want him to take/try now for his cholesterol??? (pt is aware you are OOT and it will be next week before I return his call).

## 2014-03-16 NOTE — Telephone Encounter (Signed)
Message copied by Alyson Locket on Tue Mar 16, 2014 12:44 PM ------      Message from: Devoria Glassing      Created: Tue Mar 16, 2014  9:29 AM       Patient would like to talk to you about his cholesterol medication       Please call him at [phone]  ------

## 2014-03-16 NOTE — Telephone Encounter (Signed)
Patient aware and samples left up front

## 2014-03-24 ENCOUNTER — Telehealth: Payer: Self-pay | Admitting: Family Medicine

## 2014-03-24 MED ORDER — PANTOPRAZOLE SODIUM 40 MG PO TBEC: 40.0000 mg | DELAYED_RELEASE_TABLET | Freq: Two times a day (BID) | ORAL | Status: DC

## 2014-03-24 NOTE — Telephone Encounter (Signed)
Message copied by Alyson Locket on Wed Mar 24, 2014 10:17 AM ------      Message from: Gregory Suarez      Created: Tue Mar 23, 2014  4:45 PM       Patient is calling to speak with you about his crestor       [phone] and also needs a refill on his pantoprazole  ------

## 2014-03-24 NOTE — Telephone Encounter (Signed)
Called pt and he states that the Crestor is worse then the Lipitor as far as the leg, feet and muscle pain and would like to know what else he could take? He has tried several other statins and they all cause this problem for him. I told him to DC Crestor and would give him a call back tomorrow.

## 2014-03-25 MED ORDER — PRAVASTATIN SODIUM 40 MG PO TABS: 40.0000 mg | ORAL_TABLET | Freq: Every day | ORAL | Status: DC

## 2014-03-25 NOTE — Telephone Encounter (Signed)
Call placed to patient.   States that he has been on Pravastatin before (from Dr. Claiborne Billings, cardiology), and he does not remember having any side effects with it.   States that he would like to try the Pravastatin before moving on to Zetia.   Prescription sent to pharmacy.

## 2014-03-25 NOTE — Telephone Encounter (Signed)
Has he tried pravastatin 40 mg poqday?  Try that or if he does not want a statin, he could try zetia 10 mg poqday.

## 2014-05-26 ENCOUNTER — Other Ambulatory Visit: Payer: Self-pay | Admitting: Family Medicine

## 2014-06-01 ENCOUNTER — Ambulatory Visit (INDEPENDENT_AMBULATORY_CARE_PROVIDER_SITE_OTHER): Payer: Medicare Other | Admitting: Family Medicine

## 2014-06-01 ENCOUNTER — Encounter: Payer: Self-pay | Admitting: Family Medicine

## 2014-06-01 VITALS — BP 110/60 | HR 84 | Temp 98.7°F | Resp 18 | Ht 75.0 in | Wt 254.0 lb

## 2014-06-01 DIAGNOSIS — I25810 Atherosclerosis of coronary artery bypass graft(s) without angina pectoris: Secondary | ICD-10-CM

## 2014-06-01 DIAGNOSIS — J029 Acute pharyngitis, unspecified: Secondary | ICD-10-CM | POA: Diagnosis not present

## 2014-06-01 LAB — RAPID STREP SCREEN (MED CTR MEBANE ONLY): Streptococcus, Group A Screen (Direct): POSITIVE — AB

## 2014-06-01 MED ORDER — AMOXICILLIN 875 MG PO TABS: 875.0000 mg | ORAL_TABLET | Freq: Two times a day (BID) | ORAL | Status: DC

## 2014-06-01 NOTE — Progress Notes (Signed)
Subjective:    Patient ID: Gregory Suarez, male    DOB: 04-21-1946, 68 y.o.   MRN: [id number]  HPI Patient symptoms began Friday with a dull headache and a sore throat. Since that time his has worsened. He has noticed white exudate on the back of his throat and posterior oropharynx. He also has tender cervical lymphadenopathy. He denies any rhinorrhea. He denies any cough. He does report diffuse body aches and right-sided ear pain. Examination of his right ear is completely normal. I believe this is being referred from the pain in his throat. Past Medical History  Diagnosis Date  . Hypertension   . Hyperlipidemia   . GERD (gastroesophageal reflux disease)   . CAD (coronary artery disease)   . Erectile dysfunction   . Mitral valve regurgitation   . Elevated PSA   . RLS (restless legs syndrome)    Past Surgical History  Procedure Laterality Date  . Ankle surgery    . Cholecystectomy    . Cholecystectomy     Current Outpatient Prescriptions on File Prior to Visit  Medication Sig Dispense Refill  . aspirin 325 MG tablet Take 325 mg by mouth daily.      . chlorpheniramine (ALLERGY) 4 MG tablet Take 4 mg by mouth daily as needed.      . metoprolol succinate (TOPROL-XL) 50 MG 24 hr tablet TAKE 1 TABLET BY MOUTH EVERY DAY WITH A MEAL OR IMMEDIATELY FOLLOWING A MEAL  90 tablet  1  . nitroGLYCERIN (NITROSTAT) 0.4 MG SL tablet Place 0.4 mg under the tongue every 5 (five) minutes as needed.      . pantoprazole (PROTONIX) 40 MG tablet Take 1 tablet (40 mg total) by mouth 2 (two) times daily.  180 tablet  3  . pravastatin (PRAVACHOL) 40 MG tablet Take 1 tablet (40 mg total) by mouth daily.  90 tablet  3  . Probiotic Product (PROBIOTIC FORMULA PO) Take 1 tablet by mouth daily.      Marland Kitchen rOPINIRole (REQUIP) 1 MG tablet TAKE 1 TABLET BY MOUTH EVERY NIGHT AT BEDTIME  90 tablet  3  . tadalafil (CIALIS) 5 MG tablet Take 1 tablet (5 mg total) by mouth daily.  90 tablet  4   No current  facility-administered medications on file prior to visit.   No Known Allergies History   Social History  . Marital Status: Married    Spouse Name: N/A    Number of Children: N/A  . Years of Education: N/A   Occupational History  . RETIRED    Social History Main Topics  . Smoking status: Former Smoker -- 0.50 packs/day for 15 years    Types: Cigarettes    Quit date: 10/01/1988  . Smokeless tobacco: Not on file  . Alcohol Use: No  . Drug Use: No  . Sexual Activity: Yes     Comment: married, 2 grown sons, retired maintenance man   Other Topics Concern  . Not on file   Social History Narrative  . No narrative on file      Review of Systems  All other systems reviewed and are negative.      Objective:   Physical Exam  Vitals reviewed. HENT:  Right Ear: Tympanic membrane, external ear and ear canal normal.  Left Ear: Tympanic membrane, external ear and ear canal normal.  Nose: Nose normal. Right sinus exhibits no maxillary sinus tenderness and no frontal sinus tenderness. Left sinus exhibits no maxillary sinus tenderness and no frontal sinus  tenderness.  Mouth/Throat: Posterior oropharyngeal edema and posterior oropharyngeal erythema present. No oropharyngeal exudate.  Eyes: Conjunctivae are normal.  Neck: Neck supple.  Cardiovascular: Normal rate, regular rhythm and normal heart sounds.   No murmur heard. Pulmonary/Chest: Effort normal and breath sounds normal. No respiratory distress. He has no wheezes. He has no rales.  Lymphadenopathy:    He has cervical adenopathy.          Assessment & Plan:  Acute pharyngitis, unspecified pharyngitis type - Plan: Rapid strep screen  Begin amoxicillin 875 mg pobid for positive rapid strep test for 10 days.

## 2014-06-21 ENCOUNTER — Other Ambulatory Visit: Payer: Self-pay | Admitting: Family Medicine

## 2014-06-21 MED ORDER — TAMSULOSIN HCL 0.4 MG PO CAPS: 0.4000 mg | ORAL_CAPSULE | Freq: Every day | ORAL | Status: DC

## 2014-09-13 ENCOUNTER — Other Ambulatory Visit: Payer: Self-pay | Admitting: Family Medicine

## 2014-09-13 MED ORDER — METOPROLOL TARTRATE 25 MG PO TABS: 25.0000 mg | ORAL_TABLET | Freq: Two times a day (BID) | ORAL | Status: DC

## 2014-09-13 MED ORDER — TADALAFIL 5 MG PO TABS: 5.0000 mg | ORAL_TABLET | Freq: Every day | ORAL | Status: DC

## 2014-09-25 ENCOUNTER — Other Ambulatory Visit: Payer: Self-pay | Admitting: Family Medicine

## 2014-09-26 NOTE — Telephone Encounter (Signed)
Refill appropriate and filled per protocol. 

## 2014-12-08 ENCOUNTER — Telehealth: Payer: Self-pay | Admitting: *Deleted

## 2014-12-08 ENCOUNTER — Ambulatory Visit (INDEPENDENT_AMBULATORY_CARE_PROVIDER_SITE_OTHER): Payer: Medicare Other | Admitting: Physician Assistant

## 2014-12-08 ENCOUNTER — Encounter: Payer: Self-pay | Admitting: Physician Assistant

## 2014-12-08 VITALS — BP 104/60 | HR 62 | Temp 97.7°F | Resp 18 | Ht 75.0 in | Wt 252.0 lb

## 2014-12-08 DIAGNOSIS — J988 Other specified respiratory disorders: Secondary | ICD-10-CM

## 2014-12-08 DIAGNOSIS — B9689 Other specified bacterial agents as the cause of diseases classified elsewhere: Principal | ICD-10-CM

## 2014-12-08 MED ORDER — AZITHROMYCIN 250 MG PO TABS: ORAL_TABLET | ORAL | Status: DC

## 2014-12-08 NOTE — Progress Notes (Signed)
Patient ID: Gregory Suarez MRN: [id number], DOB: May 23, 1946, 69 y.o. Date of Encounter: 12/08/2014, 12:17 PM    Chief Complaint:  Chief Complaint  Patient presents with  . Head and chest congestion, cough x 1 month     HPI: 69 y.o. year old white male says that he has been sick off and on for a month. Visit at times his symptoms would improve and he was thinking that was resolving so did not come in but then they would return. Is that within the month he is even gone down to [place] and back to visit his son. As his wife even had the same symptoms that she was seen and had antibiotic and her symptoms resolved. Finally patient came in. Says he is getting thick mucus from his nose. His congested cough with phlegm. No significant sore throat or ear pain no fevers or chills.     Home Meds:   Outpatient Prescriptions Prior to Visit  Medication Sig Dispense Refill  . aspirin 325 MG tablet Take 325 mg by mouth daily.    . chlorpheniramine (ALLERGY) 4 MG tablet Take 4 mg by mouth daily as needed.    . metoprolol tartrate (LOPRESSOR) 25 MG tablet Take 1 tablet (25 mg total) by mouth 2 (two) times daily. 180 tablet 3  . nitroGLYCERIN (NITROSTAT) 0.4 MG SL tablet Place 0.4 mg under the tongue every 5 (five) minutes as needed.    . pantoprazole (PROTONIX) 40 MG tablet Take 1 tablet (40 mg total) by mouth 2 (two) times daily. 180 tablet 3  . pravastatin (PRAVACHOL) 40 MG tablet Take 1 tablet (40 mg total) by mouth daily. 90 tablet 3  . Probiotic Product (PROBIOTIC FORMULA PO) Take 1 tablet by mouth daily.    Marland Kitchen rOPINIRole (REQUIP) 1 MG tablet TAKE 1 TABLET BY MOUTH EVERY NIGHT AT BEDTIME 90 tablet 3  . tadalafil (CIALIS) 5 MG tablet Take 1 tablet (5 mg total) by mouth daily. 30 tablet 4  . tamsulosin (FLOMAX) 0.4 MG CAPS capsule TAKE 1 CAPSULE BY MOUTH EVERY DAY 90 capsule 1  . amoxicillin (AMOXIL) 875 MG tablet Take 1 tablet (875 mg total) by mouth 2 (two) times daily. 20 tablet 0  . metoprolol  succinate (TOPROL-XL) 50 MG 24 hr tablet TAKE 1 TABLET BY MOUTH EVERY DAY WITH A MEAL OR IMMEDIATELY FOLLOWING A MEAL 90 tablet 1   No facility-administered medications prior to visit.    Allergies: No Known Allergies    Review of Systems: See HPI for pertinent ROS. All other ROS negative.    Physical Exam: Blood pressure 104/60, pulse 62, temperature 97.7 F (36.5 C), temperature source Oral, resp. rate 18, height 6\' 3"  (1.905 m), weight 252 lb (114.306 kg)., Body mass index is 31.5 kg/(m^2). General:  WNWD WM. Appears in no acute distress. HEENT: Normocephalic, atraumatic, eyes without discharge, sclera non-icteric, nares are without discharge. Bilateral auditory canals clear, TM's are without perforation, pearly grey and translucent with reflective cone of light bilaterally. Oral cavity moist, posterior pharynx without exudate, erythema, peritonsillar abscess. No tenderness with percussion of frontal or maxillary sinuses bilaterally.  Neck: Supple. No thyromegaly. No lymphadenopathy. Lungs: Clear bilaterally to auscultation without wheezes, rales, or rhonchi. Breathing is unlabored. Heart: Regular rhythm. No murmurs, rubs, or gallops. Msk:  Strength and tone normal for age. Extremities/Skin: Warm and dry. Neuro: Alert and oriented X 3. Moves all extremities spontaneously. Gait is normal. CNII-XII grossly in tact. Psych:  Responds to questions appropriately with  a normal affect.     ASSESSMENT AND PLAN:  69 y.o. year old male with  1. Bacterial respiratory infection - azithromycin (ZITHROMAX) 250 MG tablet; Day 1: Take 2 daily.  Days 2-5: Take 1 daily.  Dispense: 6 tablet; Refill: 0  Antibiotic as directed and complete all of it. Recommend Mucinex DM for expectorant. Follow-up if symptoms do not resolve within 1 week after completion of antibiotic.  [address] [place], [place], [facility] 12/08/2014 12:17 PM

## 2014-12-08 NOTE — Telephone Encounter (Signed)
Pt called stating he was suppose to call you to let you know about the medication he was taken when in office but could not remember the name of it states its called Tamsulosin but will cost him more of a copay and states that insurance will cover cheaper if he could take the finasteride 5mg  or Alfuzosine hcl ER 10mg . States to please call it in today.

## 2014-12-08 NOTE — Telephone Encounter (Signed)
Patient had office visit with me today. At the office visit he stated that he had recently gotten information from his insurance company that his prostate medicine was changing tiers. His current medication Flomax is going to be more expensive from now on. Says that the information the insurance company sent told him which medicines would be cheaper options but he forgot to bring it with him. Told him to go home and review the information and call us back. Remove the Flomax off of his medicine list as this is going to be discontinued. Replace this with finasteride 5 mg 1 by mouth daily Notify patient.

## 2014-12-09 MED ORDER — FINASTERIDE 5 MG PO TABS: 5.0000 mg | ORAL_TABLET | Freq: Every day | ORAL | Status: DC

## 2014-12-09 NOTE — Telephone Encounter (Signed)
Med sent to pharm, Flomax removed from list and pt aware of change

## 2014-12-09 NOTE — Addendum Note (Signed)
Addended by: Shary Decamp B on: 12/09/2014 03:45 PM   Modules accepted: Orders, Medications

## 2015-01-03 ENCOUNTER — Other Ambulatory Visit: Payer: Self-pay | Admitting: Family Medicine

## 2015-01-04 ENCOUNTER — Encounter: Payer: Self-pay | Admitting: Family Medicine

## 2015-01-04 NOTE — Telephone Encounter (Signed)
Medication refill for one time only.  Patient needs to be seen.  Letter sent for patient to call and schedule 

## 2015-01-28 ENCOUNTER — Other Ambulatory Visit: Payer: Self-pay | Admitting: Family Medicine

## 2015-01-28 ENCOUNTER — Other Ambulatory Visit: Payer: Medicare Other

## 2015-01-28 DIAGNOSIS — I2510 Atherosclerotic heart disease of native coronary artery without angina pectoris: Secondary | ICD-10-CM

## 2015-01-28 DIAGNOSIS — Z79899 Other long term (current) drug therapy: Secondary | ICD-10-CM

## 2015-01-28 DIAGNOSIS — E785 Hyperlipidemia, unspecified: Secondary | ICD-10-CM | POA: Diagnosis not present

## 2015-01-28 DIAGNOSIS — I10 Essential (primary) hypertension: Secondary | ICD-10-CM | POA: Diagnosis not present

## 2015-01-28 DIAGNOSIS — Z0000 Encounter for general adult medical examination without abnormal findings: Secondary | ICD-10-CM

## 2015-01-28 DIAGNOSIS — Z125 Encounter for screening for malignant neoplasm of prostate: Secondary | ICD-10-CM

## 2015-01-28 LAB — CBC WITH DIFFERENTIAL/PLATELET
Basophils Absolute: 0.1 10*3/uL (ref 0.0–0.1)
Basophils Relative: 1 % (ref 0–1)
Eosinophils Absolute: 0.2 10*3/uL (ref 0.0–0.7)
Eosinophils Relative: 4 % (ref 0–5)
HCT: 47.4 % (ref 39.0–52.0)
Hemoglobin: 16 g/dL (ref 13.0–17.0)
Lymphocytes Relative: 20 % (ref 12–46)
Lymphs Abs: 1.1 10*3/uL (ref 0.7–4.0)
MCH: 29.7 pg (ref 26.0–34.0)
MCHC: 33.8 g/dL (ref 30.0–36.0)
MCV: 88.1 fL (ref 78.0–100.0)
MPV: 9.5 fL (ref 8.6–12.4)
Monocytes Absolute: 0.5 10*3/uL (ref 0.1–1.0)
Monocytes Relative: 8 % (ref 3–12)
Neutro Abs: 3.8 10*3/uL (ref 1.7–7.7)
Neutrophils Relative %: 67 % (ref 43–77)
Platelets: 218 10*3/uL (ref 150–400)
RBC: 5.38 MIL/uL (ref 4.22–5.81)
RDW: 15.2 % (ref 11.5–15.5)
WBC: 5.7 10*3/uL (ref 4.0–10.5)

## 2015-01-28 LAB — COMPLETE METABOLIC PANEL WITH GFR
ALT: 16 U/L (ref 0–53)
AST: 23 U/L (ref 0–37)
Albumin: 3.9 g/dL (ref 3.5–5.2)
Alkaline Phosphatase: 85 U/L (ref 39–117)
BUN: 17 mg/dL (ref 6–23)
CO2: 25 mEq/L (ref 19–32)
Calcium: 9.3 mg/dL (ref 8.4–10.5)
Chloride: 104 mEq/L (ref 96–112)
Creat: 0.92 mg/dL (ref 0.50–1.35)
GFR, Est African American: >89 mL/min
GFR, Est Non African American: 85 mL/min
Glucose, Bld: 89 mg/dL (ref 70–99)
Potassium: 4.5 mEq/L (ref 3.5–5.3)
Sodium: 139 mEq/L (ref 135–145)
Total Bilirubin: 1 mg/dL (ref 0.2–1.2)
Total Protein: 7.3 g/dL (ref 6.0–8.3)

## 2015-01-28 LAB — LIPID PANEL
Cholesterol: 150 mg/dL (ref 0–200)
HDL: 43 mg/dL (ref >=40)
LDL Cholesterol: 89 mg/dL (ref 0–99)
Total CHOL/HDL Ratio: 3.5 Ratio
Triglycerides: 88 mg/dL (ref <150)
VLDL: 18 mg/dL (ref 0–40)

## 2015-01-28 LAB — TSH: TSH: 1.278 u[IU]/mL (ref 0.350–4.500)

## 2015-01-29 LAB — PSA, MEDICARE: PSA: 0.99 ng/mL (ref <=4.00)

## 2015-02-01 ENCOUNTER — Telehealth: Payer: Self-pay | Admitting: Family Medicine

## 2015-02-01 NOTE — Telephone Encounter (Signed)
Patient calling to see if his lab results were in  [phone]

## 2015-02-02 NOTE — Telephone Encounter (Signed)
Pt states that he received a copy of his BW yesterday.

## 2015-02-04 ENCOUNTER — Ambulatory Visit (INDEPENDENT_AMBULATORY_CARE_PROVIDER_SITE_OTHER): Payer: Medicare Other | Admitting: Family Medicine

## 2015-02-04 ENCOUNTER — Encounter: Payer: Self-pay | Admitting: Family Medicine

## 2015-02-04 VITALS — BP 100/64 | HR 60 | Temp 97.8°F | Resp 16 | Ht 75.0 in | Wt 249.0 lb

## 2015-02-04 DIAGNOSIS — Z23 Encounter for immunization: Secondary | ICD-10-CM | POA: Diagnosis not present

## 2015-02-04 DIAGNOSIS — Z0000 Encounter for general adult medical examination without abnormal findings: Secondary | ICD-10-CM | POA: Diagnosis not present

## 2015-02-04 MED ORDER — PANTOPRAZOLE SODIUM 40 MG PO TBEC: 40.0000 mg | DELAYED_RELEASE_TABLET | Freq: Two times a day (BID) | ORAL | Status: DC

## 2015-02-04 MED ORDER — PRAMIPEXOLE DIHYDROCHLORIDE 0.25 MG PO TABS: 0.5000 mg | ORAL_TABLET | Freq: Every day | ORAL | Status: DC

## 2015-02-04 MED ORDER — ATORVASTATIN CALCIUM 40 MG PO TABS
40.0000 mg | ORAL_TABLET | Freq: Every day | ORAL | Status: DC
Start: 2015-02-04 — End: 2016-02-09

## 2015-02-04 NOTE — Progress Notes (Signed)
Subjective:    Patient ID: Gregory Suarez, male    DOB: [birth year], 69 y.o.   MRN: [id number]  HPI Patient is here today for complete physical exam. He is due for Pneumovax 23. His colonoscopy is not due for another 2 years. He has been seeing a urologist but he has not made an appointment this year. We did check the PSA it was excellent at 0.99. He declines a digital rectal exam today as he would like to schedule to see a urologist. His biggest concern is restless legs. Requip no longer seems to be working well for him. He would like to try different alternative to see if it will work better. His most recent lab work as listed below area it is significant for an LDL cholesterol of 89. His goal LDL cholesterol is less than 70 given his history of coronary artery disease: Appointment on 01/28/2015  Component Date Value Ref Range Status  . PSA 01/28/2015 0.99  <=4.00 ng/mL Final   Comment: Test Methodology: ECLIA PSA (Electrochemiluminescence Immunoassay)   For PSA values from 2.5-4.0, particularly in younger men <44 years old, the AUA and NCCN suggest testing for % Free PSA (3515) and evaluation of the rate of increase in PSA (PSA velocity).   . Sodium 01/28/2015 139  135 - 145 mEq/L Final  . Potassium 01/28/2015 4.5  3.5 - 5.3 mEq/L Final  . Chloride 01/28/2015 104  96 - 112 mEq/L Final  . CO2 01/28/2015 25  19 - 32 mEq/L Final  . Glucose, Bld 01/28/2015 89  70 - 99 mg/dL Final  . BUN 01/28/2015 17  6 - 23 mg/dL Final  . Creat 01/28/2015 0.92  0.50 - 1.35 mg/dL Final  . Total Bilirubin 01/28/2015 1.0  0.2 - 1.2 mg/dL Final  . Alkaline Phosphatase 01/28/2015 85  39 - 117 U/L Final  . AST 01/28/2015 23  0 - 37 U/L Final  . ALT 01/28/2015 16  0 - 53 U/L Final  . Total Protein 01/28/2015 7.3  6.0 - 8.3 g/dL Final  . Albumin 01/28/2015 3.9  3.5 - 5.2 g/dL Final  . Calcium 01/28/2015 9.3  8.4 - 10.5 mg/dL Final  . GFR, Est African American 01/28/2015 >89   Final  . GFR, Est Non African  American 01/28/2015 85   Final   Comment:   The estimated GFR is a calculation valid for adults (>=53 years old) that uses the CKD-EPI algorithm to adjust for age and sex. It is   not to be used for children, pregnant women, hospitalized patients,    patients on dialysis, or with rapidly changing kidney function. According to the NKDEP, eGFR >89 is normal, 60-89 shows mild impairment, 30-59 shows moderate impairment, 15-29 shows severe impairment and <15 is ESRD.     . TSH 01/28/2015 1.278  0.350 - 4.500 uIU/mL Final  . Cholesterol 01/28/2015 150  0 - 200 mg/dL Final   Comment: ATP III Classification:       < 200        mg/dL        Desirable      200 - 239     mg/dL        Borderline High      >= 240        mg/dL        High     . Triglycerides 01/28/2015 88  <150 mg/dL Final  . HDL 01/28/2015 43  >=40 mg/dL Final   **  Please note change in reference range(s). **  . Total CHOL/HDL Ratio 01/28/2015 3.5   Final  . VLDL 01/28/2015 18  0 - 40 mg/dL Final  . LDL Cholesterol 01/28/2015 89  0 - 99 mg/dL Final   Comment:   Total Cholesterol/HDL Ratio:CHD Risk                        Coronary Heart Disease Risk Table                                        Men       Women          1/2 Average Risk              3.4        3.3              Average Risk              5.0        4.4           2X Average Risk              9.6        7.1           3X Average Risk             23.4       11.0 Use the calculated Patient Ratio above and the CHD Risk table  to determine the patient's CHD Risk. ATP III Classification (LDL):       < 100        mg/dL         Optimal      100 - 129     mg/dL         Near or Above Optimal      130 - 159     mg/dL         Borderline High      160 - 189     mg/dL         High       > 190        mg/dL         Very High     . WBC 01/28/2015 5.7  4.0 - 10.5 K/uL Final  . RBC 01/28/2015 5.38  4.22 - 5.81 MIL/uL Final  . Hemoglobin 01/28/2015 16.0  13.0 - 17.0 g/dL Final    . HCT 01/28/2015 47.4  39.0 - 52.0 % Final  . MCV 01/28/2015 88.1  78.0 - 100.0 fL Final  . MCH 01/28/2015 29.7  26.0 - 34.0 pg Final  . MCHC 01/28/2015 33.8  30.0 - 36.0 g/dL Final  . RDW 01/28/2015 15.2  11.5 - 15.5 % Final  . Platelets 01/28/2015 218  150 - 400 K/uL Final  . MPV 01/28/2015 9.5  8.6 - 12.4 fL Final  . Neutrophils Relative % 01/28/2015 67  43 - 77 % Final  . Neutro Abs 01/28/2015 3.8  1.7 - 7.7 K/uL Final  . Lymphocytes Relative 01/28/2015 20  12 - 46 % Final  . Lymphs Abs 01/28/2015 1.1  0.7 - 4.0 K/uL Final  . Monocytes Relative 01/28/2015 8  3 - 12 % Final  . Monocytes Absolute 01/28/2015 0.5  0.1 - 1.0 K/uL Final  . Eosinophils Relative 01/28/2015 4  0 - 5 % Final  . Eosinophils Absolute 01/28/2015 0.2  0.0 - 0.7 K/uL Final  . Basophils Relative 01/28/2015 1  0 - 1 % Final  . Basophils Absolute 01/28/2015 0.1  0.0 - 0.1 K/uL Final  . Smear Review 01/28/2015 Criteria for review not met   Final    Past Medical History  Diagnosis Date  . Hypertension   . Hyperlipidemia   . GERD (gastroesophageal reflux disease)   . CAD (coronary artery disease)   . Erectile dysfunction   . Mitral valve regurgitation   . Elevated PSA   . RLS (restless legs syndrome)    Past Surgical History  Procedure Laterality Date  . Ankle surgery    . Cholecystectomy    . Cholecystectomy     Current Outpatient Prescriptions on File Prior to Visit  Medication Sig Dispense Refill  . aspirin 325 MG tablet Take 325 mg by mouth daily.    . chlorpheniramine (ALLERGY) 4 MG tablet Take 4 mg by mouth daily as needed.    . finasteride (PROSCAR) 5 MG tablet Take 1 tablet (5 mg total) by mouth daily. 90 tablet 3  . metoprolol tartrate (LOPRESSOR) 25 MG tablet Take 1 tablet (25 mg total) by mouth 2 (two) times daily. 180 tablet 3  . nitroGLYCERIN (NITROSTAT) 0.4 MG SL tablet Place 0.4 mg under the tongue every 5 (five) minutes as needed.    . pantoprazole (PROTONIX) 40 MG tablet Take 1 tablet  (40 mg total) by mouth 2 (two) times daily. 180 tablet 3  . pravastatin (PRAVACHOL) 40 MG tablet Take 1 tablet (40 mg total) by mouth daily. 90 tablet 3  . Probiotic Product (PROBIOTIC FORMULA PO) Take 1 tablet by mouth daily.    Marland Kitchen rOPINIRole (REQUIP) 1 MG tablet TAKE 1 TABLET BY MOUTH EVERY NIGHT AT BEDTIME. 90 tablet 0  . tadalafil (CIALIS) 5 MG tablet Take 1 tablet (5 mg total) by mouth daily. 30 tablet 4   No current facility-administered medications on file prior to visit.   No Known Allergies History   Social History  . Marital Status: Married    Spouse Name: N/A  . Number of Children: N/A  . Years of Education: N/A   Occupational History  . RETIRED    Social History Main Topics  . Smoking status: Former Smoker -- 0.50 packs/day for 15 years    Types: Cigarettes    Quit date: 10/01/1988  . Smokeless tobacco: Not on file  . Alcohol Use: No  . Drug Use: No  . Sexual Activity: Yes     Comment: married, 2 grown sons, retired maintenance man   Other Topics Concern  . Not on file   Social History Narrative   Family History  Problem Relation Age of Onset  . Diabetes    . Breast cancer    . Coronary artery disease        Review of Systems  All other systems reviewed and are negative.      Objective:   Physical Exam  Constitutional: He is oriented to person, place, and time. He appears well-developed and well-nourished. No distress.  HENT:  Head: Normocephalic and atraumatic.  Right Ear: External ear normal.  Left Ear: External ear normal.  Nose: Nose normal.  Mouth/Throat: Oropharynx is clear and moist. No oropharyngeal exudate.  Eyes: Conjunctivae and EOM are normal. Pupils are equal, round, and reactive to light. Right eye exhibits no discharge. Left eye exhibits no discharge. No scleral icterus.  Neck: Normal range of motion. Neck supple. No JVD present. No tracheal deviation present. No thyromegaly present.  Cardiovascular: Normal rate, regular rhythm,  normal heart sounds and intact distal pulses.  Exam reveals no gallop and no friction rub.   No murmur heard. Pulmonary/Chest: Effort normal and breath sounds normal. No stridor. No respiratory distress. He has no wheezes. He has no rales. He exhibits no tenderness.  Abdominal: Soft. Bowel sounds are normal. He exhibits no distension and no mass. There is no tenderness. There is no rebound and no guarding.  Musculoskeletal: Normal range of motion. He exhibits no edema or tenderness.  Lymphadenopathy:    He has no cervical adenopathy.  Neurological: He is alert and oriented to person, place, and time. He has normal reflexes. He displays normal reflexes. No cranial nerve deficit. He exhibits normal muscle tone. Coordination normal.  Skin: Skin is warm. No rash noted. He is not diaphoretic. No erythema. No pallor.  Psychiatric: He has a normal mood and affect. His behavior is normal. Judgment and thought content normal.  Vitals reviewed.         Assessment & Plan:  Routine general medical examination at a health care facility - Plan: atorvastatin (LIPITOR) 40 MG tablet  Physical exam is excellent. I will discontinue pravastatin and switch the patient to Lipitor 40 mg by mouth daily to see if we can improve his LDL cholesterol to less than 70. The remainder of his lab work is excellent. His PSA is excellent. He declines a digital rectal exam today but will schedule to see a urologist. I will discontinue Requip and replaced with Mirapex 0.25 mg, 2 tablets by mouth daily at bedtime. He can increase up to 1 mg as tolerated. The remainder of his exam is excellent. Follow-up in one year or as needed. Patient does have a precancerous lesion on his right temple just anterior to his ear. He was scheduled to see his dermatologist. I have recommended cryotherapy for this.

## 2015-02-04 NOTE — Addendum Note (Signed)
Addended by: Shary Decamp B on: 02/04/2015 03:29 PM   Modules accepted: Orders

## 2015-03-07 DIAGNOSIS — Z1283 Encounter for screening for malignant neoplasm of skin: Secondary | ICD-10-CM | POA: Diagnosis not present

## 2015-03-07 DIAGNOSIS — L821 Other seborrheic keratosis: Secondary | ICD-10-CM | POA: Diagnosis not present

## 2015-03-07 DIAGNOSIS — D485 Neoplasm of uncertain behavior of skin: Secondary | ICD-10-CM | POA: Diagnosis not present

## 2015-03-07 DIAGNOSIS — B351 Tinea unguium: Secondary | ICD-10-CM | POA: Diagnosis not present

## 2015-03-07 DIAGNOSIS — L814 Other melanin hyperpigmentation: Secondary | ICD-10-CM | POA: Diagnosis not present

## 2015-03-07 DIAGNOSIS — D1800 Hemangioma unspecified site: Secondary | ICD-10-CM | POA: Diagnosis not present

## 2015-03-07 DIAGNOSIS — L570 Actinic keratosis: Secondary | ICD-10-CM | POA: Diagnosis not present

## 2015-03-07 DIAGNOSIS — C44319 Basal cell carcinoma of skin of other parts of face: Secondary | ICD-10-CM | POA: Diagnosis not present

## 2015-03-11 ENCOUNTER — Encounter: Payer: Self-pay | Admitting: Family Medicine

## 2015-03-11 ENCOUNTER — Ambulatory Visit (INDEPENDENT_AMBULATORY_CARE_PROVIDER_SITE_OTHER): Payer: Medicare Other | Admitting: Family Medicine

## 2015-03-11 VITALS — BP 138/82 | HR 64 | Temp 97.6°F | Resp 12 | Ht 75.0 in | Wt 253.0 lb

## 2015-03-11 DIAGNOSIS — B029 Zoster without complications: Secondary | ICD-10-CM

## 2015-03-11 DIAGNOSIS — I2510 Atherosclerotic heart disease of native coronary artery without angina pectoris: Secondary | ICD-10-CM | POA: Diagnosis not present

## 2015-03-11 MED ORDER — TIZANIDINE HCL 4 MG PO TABS: 4.0000 mg | ORAL_TABLET | Freq: Three times a day (TID) | ORAL | Status: DC | PRN

## 2015-03-11 MED ORDER — VALACYCLOVIR HCL 1 G PO TABS: 1000.0000 mg | ORAL_TABLET | Freq: Every day | ORAL | Status: DC

## 2015-03-11 NOTE — Progress Notes (Signed)
Patient ID: Gregory Suarez, male   DOB: [birth year], 69 y.o.   MRN: [id number]   Subjective:    Patient ID: Gregory Suarez, male    DOB: 01-22-1946, 69 y.o.   MRN: [id number]  Patient presents for Possible Shingles Infection  Pt here with blistering rash came up yesterday. He has history of sciatica and chronic low back pain which he sees a chiropractor for he also takes Zanaflex as needed. He was having a flare of his back pain and he went to see his chiropractor yesterday and advised him that he had a blistering spot that, he also had some tingling and numbness around his right hip into his groin but there was no rash yet. This morning he noticed that he has a blistering rash now in the groin. He has not had any fever no nausea vomiting he feels well otherwise. 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 He did have to use it a few weeks ago because of a cold sore.  He did request a refill on his Zanaflex    Review Of Systems:  GEN- denies fatigue, fever, weight loss,weakness, recent illness HEENT- denies eye drainage, change in vision, nasal discharge, CVS- denies chest pain, palpitations RESP- denies SOB, cough, wheeze ABD- denies N/V, change in stools, abd pain GU- denies dysuria, hematuria, dribbling, incontinence MSK- + joint pain,+ muscle aches, injury Neuro- denies headache, dizziness, syncope, seizure activity       Objective:    BP 138/82 mmHg  Pulse 64  Temp(Src) 97.6 F (36.4 C) (Oral)  Resp 12  Ht 6\' 3"  (1.905 m)  Wt 253 lb (114.76 kg)  BMI 31.62 kg/m2 GEN- NAD, alert and oriented x3 Skin- erythematous vesicular plaques  on right buttocks, scattred erythematous plauques to right of lumbar spine, veiscular lesions cropped together in right groin along same dermatome        Assessment & Plan:      Problem List Items Addressed This Visit    None    Visit Diagnoses    Shingles    -  Primary    Treat with valtrex TID x 7 days, pt has  extra medication because of history of cold sores to have on hand    Relevant Medications    valACYclovir (VALTREX) 1000 MG tablet       Note: This dictation was prepared with Dragon dictation along with smaller phrase technology. Any transcriptional errors that result from this process are unintentional.

## 2015-03-11 NOTE — Patient Instructions (Addendum)
Take valtrex three times a day for 7 days  Continue all other medications F/U as needed

## 2015-03-31 DIAGNOSIS — R972 Elevated prostate specific antigen [PSA]: Secondary | ICD-10-CM | POA: Diagnosis not present

## 2015-03-31 DIAGNOSIS — N401 Enlarged prostate with lower urinary tract symptoms: Secondary | ICD-10-CM | POA: Diagnosis not present

## 2015-03-31 DIAGNOSIS — R399 Unspecified symptoms and signs involving the genitourinary system: Secondary | ICD-10-CM | POA: Diagnosis not present

## 2015-03-31 DIAGNOSIS — N5201 Erectile dysfunction due to arterial insufficiency: Secondary | ICD-10-CM | POA: Diagnosis not present

## 2015-06-20 ENCOUNTER — Ambulatory Visit: Payer: Medicare Other | Admitting: Physical Therapy

## 2015-06-24 ENCOUNTER — Ambulatory Visit: Payer: Medicare Other | Admitting: Physical Therapy

## 2015-06-28 DIAGNOSIS — C4491 Basal cell carcinoma of skin, unspecified: Secondary | ICD-10-CM

## 2015-06-28 DIAGNOSIS — C44319 Basal cell carcinoma of skin of other parts of face: Secondary | ICD-10-CM | POA: Diagnosis not present

## 2015-06-28 HISTORY — DX: Basal cell carcinoma of skin, unspecified: C44.91

## 2015-07-08 ENCOUNTER — Ambulatory Visit: Payer: Medicare Other | Attending: Family Medicine | Admitting: Physical Therapy

## 2015-07-08 DIAGNOSIS — M2560 Stiffness of unspecified joint, not elsewhere classified: Secondary | ICD-10-CM

## 2015-07-08 DIAGNOSIS — M5441 Lumbago with sciatica, right side: Secondary | ICD-10-CM

## 2015-07-08 DIAGNOSIS — M546 Pain in thoracic spine: Secondary | ICD-10-CM | POA: Insufficient documentation

## 2015-07-08 DIAGNOSIS — M6281 Muscle weakness (generalized): Secondary | ICD-10-CM | POA: Diagnosis not present

## 2015-07-08 NOTE — Therapy (Signed)
[place], [place], [address] Phone: [phone]   Fax:  [phone]  Physical Therapy Evaluation  Patient Details  Name: Gregory Suarez MRN: [id number] Date of Birth: 22-Jul-1946 Referring Provider:  [name], MD  Encounter Date: 07/08/2015      PT End of Session - 07/08/15 1111    Visit Number 1   Number of Visits 8   Date for PT Re-Evaluation 09/02/15   Authorization Type VA approved 8 visits;  also Medicare G codes   PT Start Time 0932   PT Stop Time 1015   PT Time Calculation (min) 43 min   Activity Tolerance Patient limited by pain      Past Medical History  Diagnosis Date  . Hypertension   . Hyperlipidemia   . GERD (gastroesophageal reflux disease)   . CAD (coronary artery disease)   . Erectile dysfunction   . Mitral valve regurgitation   . Elevated PSA   . RLS (restless legs syndrome)     Past Surgical History  Procedure Laterality Date  . Ankle surgery    . Cholecystectomy    . Cholecystectomy      There were no vitals filed for this visit.  Visit Diagnosis:  Bilateral low back pain with right-sided sciatica - Plan: PT plan of care cert/re-cert  Midline thoracic back pain - Plan: PT plan of care cert/re-cert  Joint stiffness of spine - Plan: PT plan of care cert/re-cert  Muscle weakness - Plan: PT plan of care cert/re-cert      Subjective Assessment - 07/08/15 0936    Subjective Long history of back pain for years and years, upper back for the last 2 years.  Also right hip and left knee.  States has intermittent right sciatica.   Pertinent History I have lots of problems in feet.  Surgeries left foot, high arches   Limitations Lifting  bend and twisting   How long can you sit comfortably? varies 5 min or more   How long can you walk comfortably? 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  No   Is the patient reluctant to leave their home because of a fear of falling?  No   Home Environment   Living Environment Private residence   Living Arrangements Spouse/significant other   Type of [place] to enter   Home Layout Laundry or work area in basement   Prior Function   Level of [place] Retired   Leisure grandchildrens, wood working   Observation/Other Assessments   Focus on Therapeutic Outcomes (FOTO)  53% limited   Posture/Postural Control   Posture/Postural Control Postural limitations   Postural Limitations Decreased lumbar lordosis  slight right increased thoracic kyphosis   ROM / Strength   AROM / PROM / Strength AROM;Strength   AROM   Overall AROM Comments decreased scapular mobility    AROM Assessment Site Lumbar   Lumbar Flexion 45   Lumbar  Extension 20   Lumbar - Right Side Bend 20   Lumbar - Left Side Bend 25   Thoracic Flexion --  WFLs painful   Thoracic Extension 25% decreased   Thoracic - Left Side Bend 25% decreased painful   Thoracic - Right Rotation 25% decreased and painful    Thoracic - Left Rotation 25% decreased and painful   Strength   Strength Assessment Site Lumbar   Lumbar Flexion 3+/5   Lumbar Extension 3+/5   Flexibility   Soft Tissue Assessment /Muscle Length yes   Hamstrings 45    Quadriceps Decreased B   Palpation   Palpation comment No tenderness thoracic or lumbar paraspinals                           PT Education - 07/08/15 1110    Education provided Yes   Education Details seated thoracic extension small range; supine or sidelying abdominal bracing   Person(s) Educated Patient   Methods Explanation;Demonstration;Handout   Comprehension Verbalized understanding;Returned demonstration          PT Short Term Goals - 07/08/15 1129    PT SHORT TERM GOAL #1   Title The patient will demonstrate an understanding of basic self care including avoidance of twisting, excessive bending or sitting and basic exercises for core strengthening and flexibility   Time 4   Period Weeks   Status New   PT SHORT TERM GOAL #2   Title Patient will report a 25% improvement in pain   Time 4   Period Weeks   Status New           PT Long Term Goals - 07/08/15 1132    PT LONG TERM GOAL #1   Title The patient will be independent in safe, self progression of HEP (09/01/15)   Time 8   Period Weeks   Status New   PT LONG TERM GOAL #2   Title The patient will have improved lumbar and thoracic ROM with extension at 30 degrees, right and left sidebending 30 degrees needed for yardwork and jobs around the house   Time 8   Period Weeks   Status New   PT LONG TERM GOAL #3   Title The patient will have improved core and periscapular muscle strength to 4/5 needed for light to medium lifting  for playing with the grandkids.   Time 8   Period Weeks   Status New   PT LONG TERM GOAL #4   Title FOTO functional outcome score improved  from 53% to 40% indicating improved function with less pain    Time 8   Period Weeks   Status New   PT LONG TERM GOAL #5   Title Overall pain improved by 50%    Time 8   Period Weeks   Status New               Plan - 2015-07-23 1112    Clinical Impression Statement The patient is a 69 year old veteran with many years of low back pain and thoracic region pain for about 2 years.  He has had about 12 visits of chiropractic treatment which helped only temporarily stating that by the time he walked out the door, the pain was back.  He had a PT eval at the [place] who recommended dry needling according to the patient.  He reports his back pain is worse with lifting, twisting, lying supine, sitting or standing too long.  His best position is sidelying.  Decreased lumbar lordosis,  increased right thoracic kyphosis.  Decreased lumbar and thoracic AROM.  No directional preference identified.  Signifiicantly decreased B HS, quad and hip flexor length bilaterally.  He is unable to lie supine or prone for longer periods of time.  Decreased activation of transverse abdominals and multifidi.  Decreased scapular mobility and strength.  He would benefit from a home TENS unit for pain control.   The patient patient would benefit from PT to address these deficits.     Pt will benefit from skilled therapeutic intervention in order to improve on the following deficits Pain;Decreased strength;Decreased mobility;Impaired flexibility;Decreased range of motion   Rehab Potential Good   PT Frequency 2x / week   PT Duration 8 weeks  8 visits approved [facility]   PT Treatment/Interventions ADLs/Self Care Home Management;Moist Heat;Electrical Stimulation;Therapeutic exercise;Ultrasound;Dry needling;Patient/family education;Manual techniques   PT Next Visit Plan assess abdominal brace and  seated thoracic extension ex response;  Add gentle HS, quad and hip flexor stretching (patient states [facility] gave him some but it was too much);  dry needling lumbar multifidi, scapular muscles, manual therapy thoracic; e-stim/heat if needed; ? hip joint mobs?          G-Codes - 2015/07/23 1137    Functional Assessment Tool Used FOTO clinical judgement   Functional Limitation Mobility: Walking and moving around   Mobility: Walking and Moving Around Current Status [phone]) At least 40 percent but less than 60 percent impaired, limited or restricted   Mobility: Walking and Moving Around Goal Status [phone]) At least 20 percent but less than 40 percent impaired, limited or restricted       Problem List Patient Active Problem List   Diagnosis Date Noted  . RLS (restless legs syndrome)   . Abnormal CT of the chest 02/27/2012  . Chronic cough 02/27/2012  . Hypertension 02/26/2012  . Hyperlipidemia 02/26/2012  . GERD (gastroesophageal reflux disease)   . CAD (coronary artery disease)   . Erectile dysfunction   . Mitral valve regurgitation     Alvera Singh 07-23-2015, 11:41 AM  [place], [place], [address] Phone: [phone]   Fax:  [phone]  Ruben Im, PT 2015-07-23 11:41 AM Phone: [phone] Fax: [phone]

## 2015-07-18 ENCOUNTER — Ambulatory Visit: Payer: Medicare Other | Admitting: Physical Therapy

## 2015-07-18 DIAGNOSIS — M2560 Stiffness of unspecified joint, not elsewhere classified: Secondary | ICD-10-CM | POA: Diagnosis not present

## 2015-07-18 DIAGNOSIS — M546 Pain in thoracic spine: Secondary | ICD-10-CM | POA: Diagnosis not present

## 2015-07-18 DIAGNOSIS — M6281 Muscle weakness (generalized): Secondary | ICD-10-CM | POA: Diagnosis not present

## 2015-07-18 DIAGNOSIS — M5441 Lumbago with sciatica, right side: Secondary | ICD-10-CM | POA: Diagnosis not present

## 2015-07-18 NOTE — Patient Instructions (Signed)
Trigger Point Dry Needling  . What is Trigger Point Dry Needling (DN)? o DN is a physical therapy technique used to treat muscle pain and dysfunction. Specifically, DN helps deactivate muscle trigger points (muscle knots).  o A thin filiform needle is used to penetrate the skin and stimulate the underlying trigger point. The goal is for a local twitch response (LTR) to occur and for the trigger point to relax. No medication of any kind is injected during the procedure.   . What Does Trigger Point Dry Needling Feel Like?  o The procedure feels different for each individual patient. Some patients report that they do not actually feel the needle enter the skin and overall the process is not painful. Very mild bleeding may occur. However, many patients feel a deep cramping in the muscle in which the needle was inserted. This is the local twitch response.   Marland Kitchen How Will I feel after the treatment? o Soreness is normal, and the onset of soreness may not occur for a few hours. Typically this soreness does not last longer than two days.  o Bruising is uncommon, however; ice can be used to decrease any possible bruising.  o In rare cases feeling tired or nauseous after the treatment is normal. In addition, your symptoms may get worse before they get better, this period will typically not last longer than 24 hours.   . What Can I do After My Treatment? o Increase your hydration by drinking more water for the next 24 hours. o You may place ice or heat on the areas treated that have become sore, however, do not use heat on inflamed or bruised areas. Heat often brings more relief post needling. o You can continue your regular activities, but vigorous activity is not recommended initially after the treatment for 24 hours. o DN is best combined with other physical therapy such as strengthening, stretching, and other therapies.   Joreen Swearingin PT, DPT, LAT, ATC  07/18/2015  4:39 PM

## 2015-07-18 NOTE — Therapy (Signed)
[place], [place], [address] Phone: [phone]   Fax:  [phone]  Physical Therapy Treatment  Patient Details  Name: Gregory Suarez MRN: [id number] Date of Birth: [birth year] No Data Recorded  Encounter Date: 07/18/2015      PT End of Session - 07/18/15 1708    Visit Number 2   Number of Visits 8   Date for PT Re-Evaluation 09/02/15   Authorization Type VA approved 8 visits;  also Medicare G codes   PT Start Time 1630   PT Stop Time 1720   PT Time Calculation (min) 50 min   Activity Tolerance Patient tolerated treatment well      Past Medical History  Diagnosis Date  . Hypertension   . Hyperlipidemia   . GERD (gastroesophageal reflux disease)   . CAD (coronary artery disease)   . Erectile dysfunction   . Mitral valve regurgitation   . Elevated PSA   . RLS (restless legs syndrome)     Past Surgical History  Procedure Laterality Date  . Ankle surgery    . Cholecystectomy    . Cholecystectomy      There were no vitals filed for this visit.  Visit Diagnosis:  Bilateral low back pain with right-sided sciatica  Midline thoracic back pain  Joint stiffness of spine  Muscle weakness      Subjective Assessment - 07/18/15 1636    Subjective "I am feeling more stiff today from doing more driving"    Currently in Pain? Yes   Pain Score 3    Pain Location Back   Pain Orientation Mid;Lower   Pain Descriptors / Indicators Aching;Tightness   Pain Type Chronic pain   Pain Onset More than a month ago   Pain Frequency Constant   Aggravating Factors  lying flat on back, laying on the floor   Pain Relieving Factors tylenol                         [facility] Adult PT Treatment/Exercise - 07/18/15 1638    Lumbar Exercises: Stretches   Active Hamstring Stretch 2 reps;30 seconds   Single Knee to Chest Stretch 2 reps;30 seconds   Lower Trunk Rotation 3 reps;30 seconds   Lumbar  Exercises: Aerobic   Stationary Bike NuSTep L 5 x 5 min   Lumbar Exercises: Supine   Ab Set 10 reps;3 seconds   Bent Knee Raise 10 reps  VC for transverse abdominis contraction   Bridge 15 reps   Modalities   Modalities Moist Heat   Moist Heat Therapy   Number Minutes Moist Heat 10 Minutes   Moist Heat Location Lumbar Spine  in sitting   Manual Therapy   Manual Therapy Myofascial release;Joint mobilization;Soft tissue mobilization   Joint Mobilization grade 2-3 lumbar P> A mobs    Soft tissue mobilization instrument assited STM over bil lumbar paraspinals          Trigger Point Dry Needling - 07/18/15 1726    Consent Given? Yes   Education Handout Provided Yes   Muscles Treated Upper Body --  lumbar multifidus               PT Education - 07/18/15 1708    Education provided Yes   Education Details dry needling education   Person(s) Educated Patient   Methods Explanation;Handout   Comprehension Verbalized understanding          PT Short Term Goals -  07/08/15 1129    PT SHORT TERM GOAL #1   Title The patient will demonstrate an understanding of basic self care including avoidance of twisting, excessive bending or sitting and basic exercises for core strengthening and flexibility   Time 4   Period Weeks   Status New   PT SHORT TERM GOAL #2   Title Patient will report a 25% improvement in pain   Time 4   Period Weeks   Status New           PT Long Term Goals - 07/08/15 1132    PT LONG TERM GOAL #1   Title The patient will be independent in safe, self progression of HEP (09/01/15)   Time 8   Period Weeks   Status New   PT LONG TERM GOAL #2   Title The patient will have improved lumbar and thoracic ROM with extension at 30 degrees, right and left sidebending 30 degrees needed for yardwork and jobs around the house   Time 8   Period Weeks   Status New   PT LONG TERM GOAL #3   Title The patient will have improved core and periscapular muscle strength  to 4/5 needed for light to medium lifting for playing with the grandkids.   Time 8   Period Weeks   Status New   PT LONG TERM GOAL #4   Title FOTO functional outcome score improved from 53% to 40% indicating improved function with less pain    Time 8   Period Weeks   Status New   PT LONG TERM GOAL #5   Title Overall pain improved by 50%    Time 8   Period Weeks   Status New               Plan - 07/18/15 1709    Clinical Impression Statement Gregory Suarez reports that he had some soreness in the low to mid back. He was provided a handout for dry needling and pt consented to dry needling. deep ache and twitch was elicited for the lumbar multifidus and pt reported relief of tightness in the low back. He was able to complete all exercises without complaint.   PT Next Visit Plan   Add gentle HS, quad and hip flexor stretching (patient states [facility] gave him some but it was too much);  dry needling lumbar multifidi, scapular muscles, manual therapy thoracic; e-stim/heat if needed;  hip joint mobs?   PT Home Exercise Plan HEP review   Consulted and Agree with Plan of Care Patient        Problem List Patient Active Problem List   Diagnosis Date Noted  . RLS (restless legs syndrome)   . Abnormal CT of the chest 02/27/2012  . Chronic cough 02/27/2012  . Hypertension 02/26/2012  . Hyperlipidemia 02/26/2012  . GERD (gastroesophageal reflux disease)   . CAD (coronary artery disease)   . Erectile dysfunction   . Mitral valve regurgitation    [name] PT, DPT, LAT, ATC  07/18/2015  5:27 PM      [place], [place], [address] Phone: [phone]   Fax:  [phone]  Name: Gregory Suarez MRN: [id number] Date of Birth: 08-13-1946

## 2015-07-22 ENCOUNTER — Ambulatory Visit: Payer: Medicare Other | Admitting: Physical Therapy

## 2015-07-22 DIAGNOSIS — M2560 Stiffness of unspecified joint, not elsewhere classified: Secondary | ICD-10-CM | POA: Diagnosis not present

## 2015-07-22 DIAGNOSIS — M5441 Lumbago with sciatica, right side: Secondary | ICD-10-CM | POA: Diagnosis not present

## 2015-07-22 DIAGNOSIS — M6281 Muscle weakness (generalized): Secondary | ICD-10-CM | POA: Diagnosis not present

## 2015-07-22 DIAGNOSIS — M546 Pain in thoracic spine: Secondary | ICD-10-CM | POA: Diagnosis not present

## 2015-07-22 NOTE — Therapy (Signed)
[place], [place], [address] Phone: [phone]   Fax:  [phone]  Physical Therapy Treatment  Patient Details  Name: Gregory Suarez MRN: [id number] Date of Birth: 05/31/1946 No Data Recorded  Encounter Date: 07/22/2015      PT End of Session - 07/22/15 1143    Visit Number 3   Number of Visits 8   Date for PT Re-Evaluation 09/02/15   Authorization Type VA approved 8 visits;  also Medicare G codes   PT Start Time 1100   PT Stop Time 1155   PT Time Calculation (min) 55 min   Activity Tolerance Patient tolerated treatment well   Behavior During Therapy [facility] for tasks assessed/performed      Past Medical History  Diagnosis Date  . Hypertension   . Hyperlipidemia   . GERD (gastroesophageal reflux disease)   . CAD (coronary artery disease)   . Erectile dysfunction   . Mitral valve regurgitation   . Elevated PSA   . RLS (restless legs syndrome)     Past Surgical History  Procedure Laterality Date  . Ankle surgery    . Cholecystectomy    . Cholecystectomy      There were no vitals filed for this visit.  Visit Diagnosis:  Bilateral low back pain with right-sided sciatica  Midline thoracic back pain  Joint stiffness of spine  Muscle weakness      Subjective Assessment - 07/22/15 1100    Subjective "I felt like the needling help the low back and that I want to get the upper back needled today"    Currently in Pain? Yes   Pain Score 4    Pain Location Back   Pain Orientation Mid;Lower   Pain Descriptors / Indicators Aching;Tightness   Pain Type Chronic pain   Pain Onset More than a month ago   Pain Frequency Constant   Aggravating Factors  lying flat on the back, laying on the floor   Pain Relieving Factors tylenol                         [facility] Adult PT Treatment/Exercise - 07/22/15 1149    Lumbar Exercises: Stretches   Active Hamstring Stretch 2 reps;30 seconds    Single Knee to Chest Stretch 2 reps;30 seconds   Lower Trunk Rotation 3 reps;30 seconds   Lumbar Exercises: Aerobic   Stationary Bike NuSTep L 5 x 5 min   Lumbar Exercises: Prone   Opposite Arm/Leg Raise Right arm/Left leg;Left arm/Right leg;15 reps  on stomach   Moist Heat Therapy   Number Minutes Moist Heat 10 Minutes   Moist Heat Location Lumbar Spine  in supine   Manual Therapy   Joint Mobilization grade 3 lumbar and thoracic mobs P> A mobs, and rotational mobs in sidelying   Soft tissue mobilization instrument assited STM over bil lumbar/thoracic  paraspinals          Trigger Point Dry Needling - 07/22/15 1148    Consent Given? Yes   Education Handout Provided No   Muscles Treated Upper Body Longissimus  thoracic multifidus bil   Longissimus Response Twitch response elicited;Palpable increased muscle length              PT Education - 07/22/15 1143    Education provided Yes   Education Details dry needling lung field precautions, Updated HEP   Person(s) Educated Patient   Methods Explanation   Comprehension Verbalized understanding  PT Short Term Goals - 07/22/15 1146    PT SHORT TERM GOAL #1   Title The patient will demonstrate an understanding of basic self care including avoidance of twisting, excessive bending or sitting and basic exercises for core strengthening and flexibility   Time 4   Period Weeks   Status On-going   PT SHORT TERM GOAL #2   Title Patient will report a 25% improvement in pain   Time 4   Period Weeks   Status On-going           PT Long Term Goals - 07/22/15 1146    PT LONG TERM GOAL #1   Title The patient will be independent in safe, self progression of HEP (09/01/15)   Time 8   Status On-going   PT LONG TERM GOAL #2   Title The patient will have improved lumbar and thoracic ROM with extension at 30 degrees, right and left sidebending 30 degrees needed for yardwork and jobs around the house   Time 8   Period  Weeks   Status On-going   PT [place] #3   Title The patient will have improved core and periscapular muscle strength to 4/5 needed for light to medium lifting for playing with the grandkids.   Time 8   Period Weeks   Status On-going   PT LONG TERM GOAL #4   Title FOTO functional outcome score improved from 53% to 40% indicating improved function with less pain    Time 8   Period Weeks   Status On-going   PT LONG TERM GOAL #5   Title Overall pain improved by 50%    Time 8   Period Weeks   Status On-going               Plan - 07/22/15 1144    Clinical Impression Statement Gregory Suarez states that he felt the dry needling helped the last visit and would like to get some more treatment in the low back and up in to the thoracic spine. Discussed lung field precaustions and pt consented to treatment of the longissimus/ and multifidus in the thoracic spine bil, multiple twitches were palpable and decreased tension was noted. he reported no difficulty during exercises following todays session.    PT Next Visit Plan assess response  dry needling of thoracic/ lumbar multifidi, scapular muscles, manual therapy thoracic; e-stim/heat if needed;  hip joint mobs?   PT Home Exercise Plan hamstring stretch, hip flexor stretching, and lower trunk rotation    Consulted and Agree with Plan of Care Patient        Problem List Patient Active Problem List   Diagnosis Date Noted  . RLS (restless legs syndrome)   . Abnormal CT of the chest 02/27/2012  . Chronic cough 02/27/2012  . Hypertension 02/26/2012  . Hyperlipidemia 02/26/2012  . GERD (gastroesophageal reflux disease)   . CAD (coronary artery disease)   . Erectile dysfunction   . Mitral valve regurgitation    [name] PT, DPT, LAT, ATC  07/22/2015  11:58 AM   [facility] [address] [place], [place], [address] Phone: [phone]   Fax:  [phone]  Name: Gregory Suarez MRN: [id number] Date of Birth: 11-09-1945

## 2015-07-22 NOTE — Patient Instructions (Signed)
   Lavine Hargrove PT, DPT, LAT, ATC  [facility] Phone: [phone]     

## 2015-07-26 ENCOUNTER — Ambulatory Visit: Payer: Medicare Other | Admitting: Physical Therapy

## 2015-07-26 DIAGNOSIS — M5441 Lumbago with sciatica, right side: Secondary | ICD-10-CM | POA: Diagnosis not present

## 2015-07-26 DIAGNOSIS — M6281 Muscle weakness (generalized): Secondary | ICD-10-CM

## 2015-07-26 DIAGNOSIS — M546 Pain in thoracic spine: Secondary | ICD-10-CM | POA: Diagnosis not present

## 2015-07-26 DIAGNOSIS — M2560 Stiffness of unspecified joint, not elsewhere classified: Secondary | ICD-10-CM

## 2015-07-26 NOTE — Therapy (Signed)
[place], [place], [address] Phone: [phone]   Fax:  [phone]  Physical Therapy Treatment  Patient Details  Name: Gregory Suarez MRN: [id number] Date of Birth: Oct 13, 1945 No Data Recorded  Encounter Date: 07/26/2015      PT End of Session - 07/26/15 1141    Visit Number 4   Number of Visits 8   Date for PT Re-Evaluation 09/02/15   Authorization Type VA approved 8 visits;  also Medicare G codes   PT Start Time 1015   PT Stop Time 1110   PT Time Calculation (min) 55 min   Activity Tolerance Patient tolerated treatment well   Behavior During Therapy [facility] for tasks assessed/performed      Past Medical History  Diagnosis Date  . Hypertension   . Hyperlipidemia   . GERD (gastroesophageal reflux disease)   . CAD (coronary artery disease)   . Erectile dysfunction   . Mitral valve regurgitation   . Elevated PSA   . RLS (restless legs syndrome)     Past Surgical History  Procedure Laterality Date  . Ankle surgery    . Cholecystectomy    . Cholecystectomy      There were no vitals filed for this visit.  Visit Diagnosis:  Bilateral low back pain with right-sided sciatica  Midline thoracic back pain  Joint stiffness of spine  Muscle weakness      Subjective Assessment - 07/26/15 1019    Subjective "my low back and mid back are feeling great the pain is now in my upper back"    Currently in Pain? Yes   Pain Score 4    Pain Location Back   Pain Orientation Mid;Lower   Pain Type Chronic pain   Pain Onset More than a month ago   Pain Frequency Constant                         [facility] Adult PT Treatment/Exercise - 07/26/15 1039    Lumbar Exercises: Aerobic   UBE (Upper Arm Bike) L 1.5 x 6 min  alt direction ever 3 min   Lumbar Exercises: Standing   Row AROM;Strengthening;15 reps;Theraband   Theraband Level (Row) Level 3 (Green)   Other Standing Lumbar Exercises  horizontal abduction x 15   with bolster between shoulder blades with red theraband   Lumbar Exercises: Prone   Opposite Arm/Leg Raise Right arm/Left leg;Left arm/Right leg;15 reps   Moist Heat Therapy   Number Minutes Moist Heat 10 Minutes   Moist Heat Location Lumbar Spine  and thoracic spine   Manual Therapy   Joint Mobilization grade 3 lumbar and thoracic mobs P> A mobs, and rotational mobs in sidelying   Soft tissue mobilization instrument assited STM over bil lumbar/thoracic  paraspinals          Trigger Point Dry Needling - 07/26/15 1020    Consent Given? Yes   Education Handout Provided No   Muscles Treated Upper Body --  thoracic multifiuds   Longissimus Response Twitch response elicited;Palpable increased muscle length              PT Education - 07/26/15 1141    Education provided Yes   Education Details HEP review   Person(s) Educated Patient   Methods Explanation   Comprehension Verbalized understanding          PT Short Term Goals - 07/22/15 1146    PT SHORT TERM GOAL #1  Title The patient will demonstrate an understanding of basic self care including avoidance of twisting, excessive bending or sitting and basic exercises for core strengthening and flexibility   Time 4   Period Weeks   Status On-going   PT SHORT TERM GOAL #2   Title Patient will report a 25% improvement in pain   Time 4   Period Weeks   Status On-going           PT Long Term Goals - 07/22/15 1146    PT LONG TERM GOAL #1   Title The patient will be independent in safe, self progression of HEP (09/01/15)   Time 8   Status On-going   PT LONG TERM GOAL #2   Title The patient will have improved lumbar and thoracic ROM with extension at 30 degrees, right and left sidebending 30 degrees needed for yardwork and jobs around the house   Time 8   Period Weeks   Status On-going   PT [place] #3   Title The patient will have improved core and periscapular muscle strength to  4/5 needed for light to medium lifting for playing with the grandkids.   Time 8   Period Weeks   Status On-going   PT LONG TERM GOAL #4   Title FOTO functional outcome score improved from 53% to 40% indicating improved function with less pain    Time 8   Period Weeks   Status On-going   PT LONG TERM GOAL #5   Title Overall pain improved by 50%    Time 8   Period Weeks   Status On-going               Plan - 07/26/15 1141    Clinical Impression Statement Shadee continues to report decrease tension in the low back and thoracic spine since starting the DN treatment. He reports the tightness has moved up to the upper thoracic region. Dn was performed on the upper thoracic multifidus and twtich response was elicited and decreased tension was noted following. He was able to perform all exercsies with decreaesd pain follwing the DN.    PT Next Visit Plan assess response  dry needling of thoracic multifidi, scapular muscles, manual therapy thoracic; e-stim/heat if needed   Consulted and Agree with Plan of Care Patient        Problem List Patient Active Problem List   Diagnosis Date Noted  . RLS (restless legs syndrome)   . Abnormal CT of the chest 02/27/2012  . Chronic cough 02/27/2012  . Hypertension 02/26/2012  . Hyperlipidemia 02/26/2012  . GERD (gastroesophageal reflux disease)   . CAD (coronary artery disease)   . Erectile dysfunction   . Mitral valve regurgitation    [name] PT, DPT, LAT, ATC  07/26/2015  11:44 AM      [facility] [address] [place], [place], [address] Phone: [phone]   Fax:  [phone]  Name: Gregory Suarez MRN: [id number] Date of Birth: 1946-09-24

## 2015-07-27 ENCOUNTER — Other Ambulatory Visit: Payer: Self-pay | Admitting: Family Medicine

## 2015-07-29 ENCOUNTER — Encounter: Payer: Medicare Other | Admitting: Physical Therapy

## 2015-08-12 ENCOUNTER — Ambulatory Visit: Payer: Medicare Other | Attending: Family Medicine | Admitting: Physical Therapy

## 2015-08-12 DIAGNOSIS — M546 Pain in thoracic spine: Secondary | ICD-10-CM | POA: Diagnosis not present

## 2015-08-12 DIAGNOSIS — M2560 Stiffness of unspecified joint, not elsewhere classified: Secondary | ICD-10-CM | POA: Diagnosis not present

## 2015-08-12 DIAGNOSIS — M6281 Muscle weakness (generalized): Secondary | ICD-10-CM | POA: Diagnosis not present

## 2015-08-12 DIAGNOSIS — M5441 Lumbago with sciatica, right side: Secondary | ICD-10-CM | POA: Diagnosis not present

## 2015-08-12 NOTE — Therapy (Signed)
[place], [place], [address] Phone: [phone]   Fax:  [phone]  Physical Therapy Treatment  Patient Details  Name: Gregory Suarez MRN: [id number] Date of Birth: 1946/05/06 No Data Recorded  Encounter Date: 08/12/2015      PT End of Session - 08/12/15 0851    Visit Number 5   Number of Visits 8   Date for PT Re-Evaluation 09/02/15   Authorization Type VA approved 8 visits;  also Medicare G codes   PT Start Time 0800   PT Stop Time 0855   PT Time Calculation (min) 55 min   Activity Tolerance Patient tolerated treatment well   Behavior During Therapy [facility] for tasks assessed/performed      Past Medical History  Diagnosis Date  . Hypertension   . Hyperlipidemia   . GERD (gastroesophageal reflux disease)   . CAD (coronary artery disease)   . Erectile dysfunction   . Mitral valve regurgitation   . Elevated PSA   . RLS (restless legs syndrome)     Past Surgical History  Procedure Laterality Date  . Ankle surgery    . Cholecystectomy    . Cholecystectomy      There were no vitals filed for this visit.  Visit Diagnosis:  Bilateral low back pain with right-sided sciatica  Midline thoracic back pain  Joint stiffness of spine  Muscle weakness      Subjective Assessment - 08/12/15 0802    Subjective "I feel like the needling has been helping alot and have been able to get down and up from the floor" pt rpeorts some tightness in the upper thoracic region but it isn't too bad.   Currently in Pain? Yes   Pain Score 2    Pain Location Back   Pain Orientation Right   Pain Descriptors / Indicators Tightness   Pain Type Chronic pain   Pain Onset More than a month ago   Pain Frequency Constant   Aggravating Factors  picking up,    Pain Relieving Factors tylenol                         [facility] Adult PT Treatment/Exercise - 08/12/15 0001    Lumbar Exercises: Stretches   Single Knee to Chest Stretch 2 reps;30 seconds   Lower Trunk Rotation 2 reps;30 seconds   Lumbar Exercises: Aerobic   UBE (Upper Arm Bike) L 1.5 x 6 min   Lumbar Exercises: Standing   Row AROM;Strengthening;15 reps;Theraband   Theraband Level (Row) Level 3 (Green)   Other Standing Lumbar Exercises horizontal abduction x 15    Lumbar Exercises: Supine   Ab Set 10 reps;3 seconds   Bent Knee Raise 10 reps   Bridge 15 reps   Lumbar Exercises: Prone   Opposite Arm/Leg Raise Right arm/Left leg;Left arm/Right leg;15 reps   Other Prone Lumbar Exercises I's,T's, Y's x 8 each  pain during T's so halted exercise   Moist Heat Therapy   Number Minutes Moist Heat 10 Minutes   Moist Heat Location Other (comment)  thoracic   Manual Therapy   Joint Mobilization grade 3 lumbar and thoracic mobs P> A mobs, and rotational mobs in sidelying   Soft tissue mobilization instrument assited STM over bil lumbar/thoracic  paraspinals          Trigger Point Dry Needling - 08/12/15 0931    Consent Given? Yes   Education Handout Provided --  given previously  Muscles Treated Upper Body Rhomboids   Longissimus Response Twitch response elicited;Palpable increased muscle length              PT Education - 08/12/15 0851    Education provided Yes   Education Details eductation regarding lung fields with dry needling, posture education   Person(s) Educated Patient   Methods Explanation   Comprehension Verbalized understanding          PT Short Term Goals - 08/12/15 0943    PT SHORT TERM GOAL #1   Title The patient will demonstrate an understanding of basic self care including avoidance of twisting, excessive bending or sitting and basic exercises for core strengthening and flexibility   Time 4   Period Weeks   Status Achieved   PT SHORT TERM GOAL #2   Title Patient will report a 25% improvement in pain   Time 4   Period Weeks   Status Achieved           PT Long Term Goals -  08/12/15 0944    PT LONG TERM GOAL #1   Title The patient will be independent in safe, self progression of HEP (09/01/15)   Time 8   Period Weeks   Status On-going   PT LONG TERM GOAL #2   Title The patient will have improved lumbar and thoracic ROM with extension at 30 degrees, right and left sidebending 30 degrees needed for yardwork and jobs around the house   Time 8   Period Weeks   Status On-going   PT [place] #3   Title The patient will have improved core and periscapular muscle strength to 4/5 needed for light to medium lifting for playing with the grandkids.   Time 8   Period Weeks   Status On-going   PT LONG TERM GOAL #4   Title FOTO functional outcome score improved from 53% to 40% indicating improved function with less pain    Time 8   Period Weeks   Status On-going   PT LONG TERM GOAL #5   Title Overall pain improved by 50%    Time 8   Period Weeks   Status Achieved               Plan - 08/12/15 0851    Clinical Impression Statement Gregory Suarez reports that the needling has been helping and that he hasn't had any problems in his low back but has some stiffness in the upper thoracic spine. He met all STGS and LTG #5. He provided consent for the dry needling of the upper thoracic region and rhombhoids on the R mutliple twitches were palable and decreased tension was noted. He reported decreased pain with the STW and was able to perfrom all execises without complaint except for I'sT's and Y's due to pain in the shoulder with the T's. Plan to progress to throacic strengthening next visit.    PT Next Visit Plan assess response  dry needling of thoracic multifidi, scapular muscles, manual therapy thoracic; e-stim/heat if needed   PT Home Exercise Plan posture education    Consulted and Agree with Plan of Care Patient        Problem List Patient Active Problem List   Diagnosis Date Noted  . RLS (restless legs syndrome)   . Abnormal CT of the chest 02/27/2012  .  Chronic cough 02/27/2012  . Hypertension 02/26/2012  . Hyperlipidemia 02/26/2012  . GERD (gastroesophageal reflux disease)   . CAD (coronary artery disease)   .  Erectile dysfunction   . Mitral valve regurgitation    Gregory Suarez PT, DPT, LAT, ATC  08/12/2015  9:45 AM     [facility] [name] [place], [place], [address] Phone: [phone]   Fax:  [phone]  Name: Gregory Suarez MRN: [id number] Date of Birth: 12-07-1945

## 2015-08-12 NOTE — Patient Instructions (Signed)

## 2015-08-15 ENCOUNTER — Encounter: Payer: Medicare Other | Admitting: Physical Therapy

## 2015-08-18 ENCOUNTER — Ambulatory Visit: Payer: Medicare Other | Admitting: Physical Therapy

## 2015-08-18 DIAGNOSIS — M2560 Stiffness of unspecified joint, not elsewhere classified: Secondary | ICD-10-CM

## 2015-08-18 DIAGNOSIS — M546 Pain in thoracic spine: Secondary | ICD-10-CM | POA: Diagnosis not present

## 2015-08-18 DIAGNOSIS — M5441 Lumbago with sciatica, right side: Secondary | ICD-10-CM

## 2015-08-18 DIAGNOSIS — M6281 Muscle weakness (generalized): Secondary | ICD-10-CM | POA: Diagnosis not present

## 2015-08-18 NOTE — Therapy (Signed)
[place], [place], [address] Phone: [phone]   Fax:  [phone]  Physical Therapy Treatment  Patient Details  Name: Gregory Suarez MRN: [id number] Date of Birth: 1946-07-23 No Data Recorded  Encounter Date: 08/18/2015      PT End of Session - 08/18/15 1045    Visit Number 6   Number of Visits 8   Date for PT Re-Evaluation 09/02/15   Authorization Type VA approved 8 visits;  also Medicare G codes   PT Start Time 1015   PT Stop Time 1108   PT Time Calculation (min) 53 min   Activity Tolerance Patient tolerated treatment well   Behavior During Therapy [facility] for tasks assessed/performed      Past Medical History  Diagnosis Date  . Hypertension   . Hyperlipidemia   . GERD (gastroesophageal reflux disease)   . CAD (coronary artery disease)   . Erectile dysfunction   . Mitral valve regurgitation   . Elevated PSA   . RLS (restless legs syndrome)     Past Surgical History  Procedure Laterality Date  . Ankle surgery    . Cholecystectomy    . Cholecystectomy      There were no vitals filed for this visit.  Visit Diagnosis:  Bilateral low back pain with right-sided sciatica  Midline thoracic back pain  Joint stiffness of spine  Muscle weakness      Subjective Assessment - 08/18/15 1015    Subjective "I feel like I am doing much better, I still have some soreness in one spot at the top of by back/ shoulder"   Currently in Pain? Yes   Pain Score 3    Pain Location Back   Pain Orientation Right   Pain Descriptors / Indicators Tightness   Pain Type Chronic pain   Pain Onset More than a month ago                         [facility] Adult PT Treatment/Exercise - 08/18/15 0001    Lumbar Exercises: Aerobic   UBE (Upper Arm Bike) L 2 x 55min  alternating direction every 4 minutes   Lumbar Exercises: Standing   Row AROM;Strengthening;15 reps;Theraband   Theraband Level (Row)  Level 4 (Blue)   Shoulder Extension AROM;Strengthening;Theraband;15 reps   Theraband Level (Shoulder Extension) Level 4 (Blue)   Other Standing Lumbar Exercises horizontal abduction x 15 with shoulder flexion/ ext  with red theraband   Lumbar Exercises: Supine   Other Supine Lumbar Exercises ceiling punches with 5# 2 x 15          Trigger Point Dry Needling - 08/18/15 1035    Consent Given? Yes   Education Handout Provided Yes   Muscles Treated Upper Body Rhomboids  thoracic multifidus   Rhomboids Response Twitch response elicited;Palpable increased muscle length   Longissimus Response Twitch response elicited;Palpable increased muscle length                PT Short Term Goals - 08/12/15 0943    PT SHORT TERM GOAL #1   Title The patient will demonstrate an understanding of basic self care including avoidance of twisting, excessive bending or sitting and basic exercises for core strengthening and flexibility   Time 4   Period Weeks   Status Achieved   PT SHORT TERM GOAL #2   Title Patient will report a 25% improvement in pain   Time 4   Period  Weeks   Status Achieved           PT Long Term Goals - 08/12/15 0944    PT LONG TERM GOAL #1   Title The patient will be independent in safe, self progression of HEP (09/01/15)   Time 8   Period Weeks   Status On-going   PT LONG TERM GOAL #2   Title The patient will have improved lumbar and thoracic ROM with extension at 30 degrees, right and left sidebending 30 degrees needed for yardwork and jobs around the house   Time 8   Period Weeks   Status On-going   PT [place] #3   Title The patient will have improved core and periscapular muscle strength to 4/5 needed for light to medium lifting for playing with the grandkids.   Time 8   Period Weeks   Status On-going   PT LONG TERM GOAL #4   Title FOTO functional outcome score improved from 53% to 40% indicating improved function with less pain    Time 8   Period  Weeks   Status On-going   PT LONG TERM GOAL #5   Title Overall pain improved by 50%    Time 8   Period Weeks   Status Achieved               Plan - 08/18/15 1046    Clinical Impression Statement [name] states that he is doing much better but still has some intermittent tightness in the R rhomboids/ longissimus. Following dry needling he reported relief of symptoms and stated that he had no pain. He was able to complete all exercises given without complaint. Discussed with pt that he is nearing the end of his approved visits and depending on his progress may d/c him soon.    PT Next Visit Plan assess response  dry needling of thoracic multifidi, scapular muscles, manual therapy thoracic; e-stim/heat if needed   Consulted and Agree with Plan of Care Patient        Problem List Patient Active Problem List   Diagnosis Date Noted  . RLS (restless legs syndrome)   . Abnormal CT of the chest 02/27/2012  . Chronic cough 02/27/2012  . Hypertension 02/26/2012  . Hyperlipidemia 02/26/2012  . GERD (gastroesophageal reflux disease)   . CAD (coronary artery disease)   . Erectile dysfunction   . Mitral valve regurgitation    Gregory Suarez PT, DPT, LAT, ATC  08/18/2015  11:02 AM      [place] [facility] [address] [place], [place], [address] Phone: [phone]   Fax:  [phone]  Name: Gregory Suarez MRN: [id number] Date of Birth: 1946/02/13

## 2015-08-22 ENCOUNTER — Ambulatory Visit: Payer: Medicare Other | Admitting: Physical Therapy

## 2015-08-22 DIAGNOSIS — M6281 Muscle weakness (generalized): Secondary | ICD-10-CM

## 2015-08-22 DIAGNOSIS — M5441 Lumbago with sciatica, right side: Secondary | ICD-10-CM

## 2015-08-22 DIAGNOSIS — M2560 Stiffness of unspecified joint, not elsewhere classified: Secondary | ICD-10-CM

## 2015-08-22 DIAGNOSIS — M546 Pain in thoracic spine: Secondary | ICD-10-CM

## 2015-08-22 NOTE — Therapy (Signed)
[place], [place], [address] Phone: [phone]   Fax:  [phone]  Physical Therapy Treatment  Patient Details  Name: Gregory Suarez MRN: [id number] Date of Birth: [birth year] No Data Recorded  Encounter Date: 08/22/2015      PT End of Session - 08/22/15 1101    Visit Number 7   Number of Visits 8   Date for PT Re-Evaluation 09/02/15   Authorization Type VA approved 8 visits;  also Medicare G codes   PT Start Time 1015   PT Stop Time 1100   PT Time Calculation (min) 45 min   Activity Tolerance Patient tolerated treatment well   Behavior During Therapy [facility] for tasks assessed/performed      Past Medical History  Diagnosis Date  . Hypertension   . Hyperlipidemia   . GERD (gastroesophageal reflux disease)   . CAD (coronary artery disease)   . Erectile dysfunction   . Mitral valve regurgitation   . Elevated PSA   . RLS (restless legs syndrome)     Past Surgical History  Procedure Laterality Date  . Ankle surgery    . Cholecystectomy    . Cholecystectomy      There were no vitals filed for this visit.  Visit Diagnosis:  Bilateral low back pain with right-sided sciatica  Midline thoracic back pain  Joint stiffness of spine  Muscle weakness      Subjective Assessment - 08/22/15 1022    Subjective "I am doing better, I still have 1 spot that is a little tight, it feels like I need to pop my back"    Currently in Pain? Yes   Pain Score 1    Pain Location Back   Pain Orientation Right   Pain Descriptors / Indicators Tightness   Pain Type Chronic pain   Pain Onset More than a month ago   Pain Frequency Intermittent   Aggravating Factors  trunk movement   Pain Relieving Factors nothing really                         [facility] Adult PT Treatment/Exercise - 08/22/15 1039    Lumbar Exercises: Aerobic   UBE (Upper Arm Bike) L 3 x 10 min  alternating direction every 5 min    Lumbar Exercises: Standing   Row AROM;Strengthening;15 reps;Theraband   Theraband Level (Row) Level 4 (Blue)   Shoulder Extension AROM;Strengthening;Theraband;15 reps   Theraband Level (Shoulder Extension) Level 4 (Blue)   Other Standing Lumbar Exercises thoracic extenion over chair 2x 10 with 5 sec hold   Other Standing Lumbar Exercises horizontal abduction x 15 with bolster between shoulders, and D2 PNF bil 2 x 10 with yellow theraband   Shoulder Exercises: Stretch   Other Shoulder Stretches rhomboid stretch 2 x 30 sec    Manual Therapy   Joint Mobilization grade 3 lumbar and thoracic mobs P> A mobs, and P>A rib mobs at T4 bil   Soft tissue mobilization instrument assited STM over bil thoracic  paraspinals          Trigger Point Dry Needling - 08/22/15 1038    Consent Given? Yes   Education Handout Provided Yes   Muscles Treated Upper Body Longissimus  twitch response elicited              PT Education - 08/22/15 1101    Education provided Yes   Education Details updated HEP   Person(s) Educated Patient  Methods Explanation   Comprehension Verbalized understanding          PT Short Term Goals - 08/12/15 0943    PT SHORT TERM GOAL #1   Title The patient will demonstrate an understanding of basic self care including avoidance of twisting, excessive bending or sitting and basic exercises for core strengthening and flexibility   Time 4   Period Weeks   Status Achieved   PT SHORT TERM GOAL #2   Title Patient will report a 25% improvement in pain   Time 4   Period Weeks   Status Achieved           PT Long Term Goals - 08/12/15 [id number]    PT LONG TERM GOAL #1   Title The patient will be independent in safe, self progression of HEP (09/01/15)   Time 8   Period Weeks   Status On-going   PT LONG TERM GOAL #2   Title The patient will have improved lumbar and thoracic ROM with extension at 30 degrees, right and left sidebending 30 degrees needed for yardwork  and jobs around the house   Time 8   Period Weeks   Status On-going   PT [place] #3   Title The patient will have improved core and periscapular muscle strength to 4/5 needed for light to medium lifting for playing with the grandkids.   Time 8   Period Weeks   Status On-going   PT LONG TERM GOAL #4   Title FOTO functional outcome score improved from 53% to 40% indicating improved function with less pain    Time 8   Period Weeks   Status On-going   PT LONG TERM GOAL #5   Title Overall pain improved by 50%    Time 8   Period Weeks   Status Achieved               Plan - 08/22/15 1114    Clinical Impression Statement Gregory Suarez continues to make great progress with therapy with report that the pain is in only one pin point spot. pt consent for dry needling of the thracic longissiumus and multifiuds which he reported relief and 0/10 pain following the DN in combination with thoracic and rib mobs. He was able to complete all exercises today without complaint. Discussed with pt that next visit is the last approved visit and will d/c then.    PT Next Visit Plan assess response  dry needling of thoracic multifidi, scapular muscles, manual therapy thoracic; e-stim/heat if needed   PT Home Exercise Plan rows with blue theraband   Consulted and Agree with Plan of Care Patient        Problem List Patient Active Problem List   Diagnosis Date Noted  . RLS (restless legs syndrome)   . Abnormal CT of the chest 02/27/2012  . Chronic cough 02/27/2012  . Hypertension 02/26/2012  . Hyperlipidemia 02/26/2012  . GERD (gastroesophageal reflux disease)   . CAD (coronary artery disease)   . Erectile dysfunction   . Mitral valve regurgitation    Gregory Suarez PT, DPT, LAT, ATC  08/22/2015  11:49 AM    [facility] [address] [place], [place], [address] Phone: [phone]   Fax:  [phone]  Name: Gregory Suarez MRN:  [id number] Date of Birth: 01-02-1946

## 2015-08-24 ENCOUNTER — Ambulatory Visit: Payer: Medicare Other | Admitting: Physical Therapy

## 2015-08-24 DIAGNOSIS — M6281 Muscle weakness (generalized): Secondary | ICD-10-CM

## 2015-08-24 DIAGNOSIS — M2560 Stiffness of unspecified joint, not elsewhere classified: Secondary | ICD-10-CM | POA: Diagnosis not present

## 2015-08-24 DIAGNOSIS — M546 Pain in thoracic spine: Secondary | ICD-10-CM

## 2015-08-24 DIAGNOSIS — M5441 Lumbago with sciatica, right side: Secondary | ICD-10-CM | POA: Diagnosis not present

## 2015-08-24 NOTE — Therapy (Signed)
Gregory Suarez, [place], [address] Phone: [phone]   Fax:  [phone]  Physical Therapy Treatment / discharge note  Patient Details  Name: Gregory Suarez MRN: [id number] Date of Birth: 12-Mar-1946 No Data Recorded  Encounter Date: 08/24/2015      PT End of Session - 08/24/15 1034    Visit Number 8   Number of Visits 8   Date for PT Re-Evaluation 09/02/15   Authorization Type VA approved 8 visits;  also Medicare G codes   PT Start Time 1010   PT Stop Time 1034   PT Time Calculation (min) 24 min   Activity Tolerance Patient tolerated treatment well   Behavior During Therapy [facility] for tasks assessed/performed      Past Medical History  Diagnosis Date  . Hypertension   . Hyperlipidemia   . GERD (gastroesophageal reflux disease)   . CAD (coronary artery disease)   . Erectile dysfunction   . Mitral valve regurgitation   . Elevated PSA   . RLS (restless legs syndrome)     Past Surgical History  Procedure Laterality Date  . Ankle surgery    . Cholecystectomy    . Cholecystectomy      There were no vitals filed for this visit.  Visit Diagnosis:  Bilateral low back pain with right-sided sciatica  Midline thoracic back pain  Muscle weakness  Joint stiffness of spine      Subjective Assessment - 08/24/15 1017    Subjective "I am doing really well and can tell that I have made alot of progress with the dry needling" pt reports he has some soreness in the back but it is from doing too much the other day.    Currently in Pain? Yes   Pain Score 1    Pain Location Back   Pain Orientation Right            [facility] PT Assessment - 08/24/15 1017    Observation/Other Assessments   Focus on Therapeutic Outcomes (FOTO)  30% limited   AROM   Lumbar Flexion 62   Lumbar Extension 22   Lumbar - Right Side Bend 30   Lumbar - Left Side Bend 30                     [facility] Adult PT  Treatment/Exercise - 08/24/15 0001    Self-Care   Self-Care Other Self-Care Comments   Other Self-Care Comments  educated on continuing exercises to progress with maintenance and decrease back tightness, with focus on core strengthening and stretching of the low back.    Lumbar Exercises: Aerobic   UBE (Upper Arm Bike) L4 x 8 min                PT Education - 08/24/15 1034    Education provided Yes   Education Details HEP Review and progress education   Person(s) Educated Patient   Methods Explanation   Comprehension Verbalized understanding          PT Short Term Goals - 08/12/15 0943    PT SHORT TERM GOAL #1   Title The patient will demonstrate an understanding of basic self care including avoidance of twisting, excessive bending or sitting and basic exercises for core strengthening and flexibility   Time 4   Period Weeks   Status Achieved   PT SHORT TERM GOAL #2   Title Patient will report a 25% improvement in pain   Time 4  Period Weeks   Status Achieved           PT Long Term Goals - 08-31-2015 1022    PT LONG TERM GOAL #1   Title The patient will be independent in safe, self progression of HEP (09/01/15)   Time 8   Period Weeks   Status Achieved   PT LONG TERM GOAL #2   Title The patient will have improved lumbar and thoracic ROM with extension at 30 degrees, right and left sidebending 30 degrees needed for yardwork and jobs around the house   Time 8   Period Weeks   Status Achieved   PT LONG TERM GOAL #3   Title The patient will have improved core and periscapular muscle strength to 4/5 needed for light to medium lifting for playing with the grandkids.   Time 8   Period Weeks   Status Achieved   PT LONG TERM GOAL #4   Title FOTO functional outcome score improved from 53% to 40% indicating improved function with less pain    Time 8   Period Weeks   Status Achieved   PT LONG TERM GOAL #5   Title Overall pain improved by 50%    Time 8   Period  Weeks   Status Achieved               Plan - 08-31-2015 1035    Clinical Impression Statement [name] has made great progress with decreased low back and thoracic spine tightness/ pain. He has increased his mobility with decreased tightness noted during and following movement. He met all goals this visit and improved his FOTO score to 30% limited. He reports that he is able to maintain and progress his current level of function independently and will be discharged from PT today.    PT Next Visit Plan DC   PT Home Exercise Plan HEP review   Consulted and Agree with Plan of Care Patient          G-Codes - August 31, 2015 1036    Functional Assessment Tool Used FOTO 30% limited   Functional Limitation Mobility: Walking and moving around   Mobility: Walking and Moving Around Goal Status [phone]) At least 20 percent but less than 40 percent impaired, limited or restricted   Mobility: Walking and Moving Around Discharge Status [phone]) At least 20 percent but less than 40 percent impaired, limited or restricted      Problem List Patient Active Problem List   Diagnosis Date Noted  . RLS (restless legs syndrome)   . Abnormal CT of the chest 02/27/2012  . Chronic cough 02/27/2012  . Hypertension 02/26/2012  . Hyperlipidemia 02/26/2012  . GERD (gastroesophageal reflux disease)   . CAD (coronary artery disease)   . Erectile dysfunction   . Mitral valve regurgitation    [name] PT, DPT, LAT, ATC  Aug 31, 2015  10:38 AM     [facility] [address] [place], [place], [address] Phone: [phone]   Fax:  [phone]  Name: Gregory Suarez MRN: [id number] Date of Birth: 1945/12/25     PHYSICAL THERAPY DISCHARGE SUMMARY  Visits from Start of Care: 8  Current functional level related to goals / functional outcomes: FOTO 30% limited   Remaining deficits: Intermittent upper thoracic muscle tightness.    Education /  Equipment: HEP   Plan: Patient agrees to discharge.  Patient goals were met. Patient is being discharged due to meeting the stated rehab goals.  ?????  Sharmila Wrobleski PT, DPT, LAT, ATC  08/24/2015  10:39 AM

## 2015-08-31 ENCOUNTER — Encounter: Payer: Medicare Other | Admitting: Physical Therapy

## 2015-10-19 ENCOUNTER — Ambulatory Visit (INDEPENDENT_AMBULATORY_CARE_PROVIDER_SITE_OTHER): Payer: Medicare Other | Admitting: Physician Assistant

## 2015-10-19 ENCOUNTER — Encounter: Payer: Self-pay | Admitting: Physician Assistant

## 2015-10-19 VITALS — BP 106/66 | HR 64 | Temp 98.3°F | Resp 18 | Wt 242.0 lb

## 2015-10-19 DIAGNOSIS — J329 Chronic sinusitis, unspecified: Secondary | ICD-10-CM | POA: Diagnosis not present

## 2015-10-19 MED ORDER — PREDNISONE 20 MG PO TABS: ORAL_TABLET | ORAL | Status: DC

## 2015-10-19 NOTE — Progress Notes (Signed)
Patient ID: Gregory Suarez MRN: [id number], DOB: 1946-01-21, 70 y.o. Date of Encounter: 10/19/2015, 1:35 PM    Chief Complaint:  Chief Complaint  Patient presents with  . sick    rt ear pain off/on, now lt ear pain, voice getting hoarse     HPI: 70 y.o. year old white male says that he has a history of chronic problems with his sinuses. Says that he has had pain and discomfort in his cheeks and up towards his father forehead off and on throughout the past year. However says that when we had recent snowstorm (which was around January 7)--says that at that time he was having significantly increased symptoms of discomfort around the right ear to the right cheek and the right upper teeth. Says that those symptoms have gotten better. However the teeth on the right side still feels sensitive and irritated and now he is noticing some hoarseness in his throat. Says that he has not been blowing anything out of his nose and has had no chest congestion or cough. No fevers or chills. Says feels pressure behind right ear.     Home Meds:   Outpatient Prescriptions Prior to Visit  Medication Sig Dispense Refill  . aspirin 325 MG tablet Take 325 mg by mouth daily.    Marland Kitchen atorvastatin (LIPITOR) 40 MG tablet Take 1 tablet (40 mg total) by mouth daily. 90 tablet 3  . chlorpheniramine (ALLERGY) 4 MG tablet Take 4 mg by mouth daily as needed.    . finasteride (PROSCAR) 5 MG tablet Take 1 tablet (5 mg total) by mouth daily. 90 tablet 3  . metoprolol tartrate (LOPRESSOR) 25 MG tablet Take 1 tablet (25 mg total) by mouth 2 (two) times daily. 180 tablet 3  . nitroGLYCERIN (NITROSTAT) 0.4 MG SL tablet Place 0.4 mg under the tongue every 5 (five) minutes as needed.    . pantoprazole (PROTONIX) 40 MG tablet Take 1 tablet (40 mg total) by mouth 2 (two) times daily. 60 tablet 11  . pramipexole (MIRAPEX) 0.25 MG tablet TAKE 2 TABLETS(0.5 MG) BY MOUTH AT BEDTIME 60 tablet 11  . Probiotic Product (PROBIOTIC FORMULA PO)  Take 1 tablet by mouth daily.    . tadalafil (CIALIS) 5 MG tablet Take 1 tablet (5 mg total) by mouth daily. 30 tablet 4  . tiZANidine (ZANAFLEX) 4 MG tablet Take 1 tablet (4 mg total) by mouth every 8 (eight) hours as needed for muscle spasms. 30 tablet 1  . valACYclovir (VALTREX) 1000 MG tablet Take 1 tablet (1,000 mg total) by mouth daily. 30 tablet 1   No facility-administered medications prior to visit.    Allergies: No Known Allergies    Review of Systems: See HPI for pertinent ROS. All other ROS negative.    Physical Exam: Blood pressure 106/66, pulse 64, temperature 98.3 F (36.8 C), temperature source Oral, resp. rate 18, weight 242 lb (109.77 kg)., Body mass index is 30.25 kg/(m^2). General:  WNWD WM. Appears in no acute distress. HEENT: Normocephalic, atraumatic, eyes without discharge, sclera non-icteric, nares are without discharge. Bilateral auditory canals clear, TM's are without perforation, pearly grey and translucent with reflective cone of light bilaterally. Oral cavity moist, posterior pharynx without exudate, erythema, peritonsillar abscess. No tenderness with palpation of the TM joints bilaterally. Mild tenderness with percussion to the right maxillary sinus. Minimal tenderness with percussion to the frontal sinuses.  Neck: Supple. No thyromegaly. No lymphadenopathy. Lungs: Clear bilaterally to auscultation without wheezes, rales, or rhonchi. Breathing is  unlabored. Heart: Regular rhythm. No murmurs, rubs, or gallops. Msk:  Strength and tone normal for age. Extremities/Skin: Warm and dry. Neuro: Alert and oriented X 3. Moves all extremities spontaneously. Gait is normal. CNII-XII grossly in tact. Psych:  Responds to questions appropriately with a normal affect.     ASSESSMENT AND PLAN:  70 y.o. year old male with  1. Chronic sinusitis, unspecified location He is to take prednisone taper as directed. Follow-up if symptoms do not resolve upon completion of this. -  predniSONE (DELTASONE) 20 MG tablet; Take 3 daily for 2 days, then 2 daily for 2 days, then 1 daily for 2 days.  Dispense: 12 tablet; Refill: 0   Signed, [address] [place], [place], [facility] 10/19/2015 1:35 PM

## 2015-12-27 ENCOUNTER — Other Ambulatory Visit: Payer: Self-pay | Admitting: Family Medicine

## 2016-02-06 ENCOUNTER — Other Ambulatory Visit: Payer: Medicare Other

## 2016-02-06 DIAGNOSIS — Z79899 Other long term (current) drug therapy: Secondary | ICD-10-CM | POA: Diagnosis not present

## 2016-02-06 DIAGNOSIS — Z125 Encounter for screening for malignant neoplasm of prostate: Secondary | ICD-10-CM

## 2016-02-06 DIAGNOSIS — E785 Hyperlipidemia, unspecified: Secondary | ICD-10-CM

## 2016-02-06 DIAGNOSIS — I10 Essential (primary) hypertension: Secondary | ICD-10-CM

## 2016-02-06 LAB — CBC WITH DIFFERENTIAL/PLATELET
Basophils Absolute: 0 cells/uL (ref 0–200)
Basophils Relative: 0 %
Eosinophils Absolute: 282 cells/uL (ref 15–500)
Eosinophils Relative: 6 %
HCT: 42.8 % (ref 38.5–50.0)
Hemoglobin: 14.4 g/dL (ref 13.0–17.0)
Lymphocytes Relative: 17 %
Lymphs Abs: 799 cells/uL — ABNORMAL LOW (ref 850–3900)
MCH: 30.9 pg (ref 27.0–33.0)
MCHC: 33.6 g/dL (ref 32.0–36.0)
MCV: 91.8 fL (ref 80.0–100.0)
MPV: 9.4 fL (ref 7.5–12.5)
Monocytes Absolute: 376 cells/uL (ref 200–950)
Monocytes Relative: 8 %
Neutro Abs: 3243 cells/uL (ref 1500–7800)
Neutrophils Relative %: 69 %
Platelets: 163 10*3/uL (ref 140–400)
RBC: 4.66 MIL/uL (ref 4.20–5.80)
RDW: 14.5 % (ref 11.0–15.0)
WBC: 4.7 10*3/uL (ref 3.8–10.8)

## 2016-02-07 LAB — COMPREHENSIVE METABOLIC PANEL
ALT: 24 U/L (ref 9–46)
AST: 29 U/L (ref 10–35)
Albumin: 3.4 g/dL — ABNORMAL LOW (ref 3.6–5.1)
Alkaline Phosphatase: 115 U/L (ref 40–115)
BUN: 12 mg/dL (ref 7–25)
CO2: 29 mmol/L (ref 20–31)
Calcium: 8.6 mg/dL (ref 8.6–10.3)
Chloride: 105 mmol/L (ref 98–110)
Creat: 0.71 mg/dL (ref 0.70–1.25)
Glucose, Bld: 89 mg/dL (ref 70–99)
Potassium: 3.9 mmol/L (ref 3.5–5.3)
Sodium: 141 mmol/L (ref 135–146)
Total Bilirubin: 0.7 mg/dL (ref 0.2–1.2)
Total Protein: 6 g/dL — ABNORMAL LOW (ref 6.1–8.1)

## 2016-02-07 LAB — LIPID PANEL
Cholesterol: 82 mg/dL — ABNORMAL LOW (ref 125–200)
HDL: 33 mg/dL — ABNORMAL LOW (ref >=40)
LDL Cholesterol: 36 mg/dL (ref <130)
Total CHOL/HDL Ratio: 2.5 Ratio (ref <=5.0)
Triglycerides: 66 mg/dL (ref <150)
VLDL: 13 mg/dL (ref <30)

## 2016-02-07 LAB — PSA, MEDICARE: PSA: 0.44 ng/mL (ref <=4.00)

## 2016-02-09 ENCOUNTER — Encounter: Payer: Self-pay | Admitting: Family Medicine

## 2016-02-09 ENCOUNTER — Ambulatory Visit (INDEPENDENT_AMBULATORY_CARE_PROVIDER_SITE_OTHER): Payer: Medicare Other | Admitting: Family Medicine

## 2016-02-09 VITALS — BP 110/62 | HR 78 | Temp 97.9°F | Resp 16 | Ht 75.0 in | Wt 203.0 lb

## 2016-02-09 DIAGNOSIS — Z0000 Encounter for general adult medical examination without abnormal findings: Secondary | ICD-10-CM

## 2016-02-09 MED ORDER — ATORVASTATIN CALCIUM 40 MG PO TABS: 40.0000 mg | ORAL_TABLET | Freq: Every day | ORAL | Status: DC

## 2016-02-09 MED ORDER — NITROGLYCERIN 0.4 MG SL SUBL: 0.4000 mg | SUBLINGUAL_TABLET | SUBLINGUAL | Status: DC | PRN

## 2016-02-09 MED ORDER — PANTOPRAZOLE SODIUM 40 MG PO TBEC: 40.0000 mg | DELAYED_RELEASE_TABLET | Freq: Two times a day (BID) | ORAL | Status: DC

## 2016-02-09 NOTE — Progress Notes (Signed)
Subjective:    Patient ID: Gregory Suarez, male    DOB: 29-Jul-1946, 70 y.o.   MRN: [id number]  HPI  Patient is here today for complete physical exam. His colonoscopy is not due until 2018.  Immunization History  Administered Date(s) Administered  . Pneumococcal Conjugate-13 01/16/2014  . Pneumococcal Polysaccharide-23 02/04/2015  . Td 03/31/2009  . Tdap 05/24/2009  . Zoster 07/06/2011   Patient sees a urologist every year for digital rectal exam. His PSA today is excellent. He declines HIV and hepatitis C screening. He has lost substantial weight intentionally with a specialized diet. His most recent lab work is listed below:  Lab on 02/06/2016  Component Date Value Ref Range Status  . WBC 02/06/2016 4.7  3.8 - 10.8 K/uL Final  . RBC 02/06/2016 4.66  4.20 - 5.80 MIL/uL Final  . Hemoglobin 02/06/2016 14.4  13.0 - 17.0 g/dL Final  . HCT 02/06/2016 42.8  38.5 - 50.0 % Final  . MCV 02/06/2016 91.8  80.0 - 100.0 fL Final  . MCH 02/06/2016 30.9  27.0 - 33.0 pg Final  . MCHC 02/06/2016 33.6  32.0 - 36.0 g/dL Final  . RDW 02/06/2016 14.5  11.0 - 15.0 % Final  . Platelets 02/06/2016 163  140 - 400 K/uL Final  . MPV 02/06/2016 9.4  7.5 - 12.5 fL Final  . Neutro Abs 02/06/2016 3243  1500 - 7800 cells/uL Final  . Lymphs Abs 02/06/2016 799* 850 - 3900 cells/uL Final  . Monocytes Absolute 02/06/2016 376  200 - 950 cells/uL Final  . Eosinophils Absolute 02/06/2016 282  15 - 500 cells/uL Final  . Basophils Absolute 02/06/2016 0  0 - 200 cells/uL Final  . Neutrophils Relative % 02/06/2016 69   Final  . Lymphocytes Relative 02/06/2016 17   Final  . Monocytes Relative 02/06/2016 8   Final  . Eosinophils Relative 02/06/2016 6   Final  . Basophils Relative 02/06/2016 0   Final  . Smear Review 02/06/2016 Criteria for review not met   Final   ** Please note change in unit of measure and reference range(s). **  . Sodium 02/06/2016 141  135 - 146 mmol/L Final  . Potassium 02/06/2016 3.9  3.5 - 5.3  mmol/L Final  . Chloride 02/06/2016 105  98 - 110 mmol/L Final  . CO2 02/06/2016 29  20 - 31 mmol/L Final  . Glucose, Bld 02/06/2016 89  70 - 99 mg/dL Final  . BUN 02/06/2016 12  7 - 25 mg/dL Final  . Creat 02/06/2016 0.71  0.70 - 1.25 mg/dL Final  . Total Bilirubin 02/06/2016 0.7  0.2 - 1.2 mg/dL Final  . Alkaline Phosphatase 02/06/2016 115  40 - 115 U/L Final  . AST 02/06/2016 29  10 - 35 U/L Final  . ALT 02/06/2016 24  9 - 46 U/L Final  . Total Protein 02/06/2016 6.0* 6.1 - 8.1 g/dL Final  . Albumin 02/06/2016 3.4* 3.6 - 5.1 g/dL Final  . Calcium 02/06/2016 8.6  8.6 - 10.3 mg/dL Final  . Cholesterol 02/06/2016 82* 125 - 200 mg/dL Final  . Triglycerides 02/06/2016 66  <150 mg/dL Final  . HDL 02/06/2016 33* >=40 mg/dL Final  . Total CHOL/HDL Ratio 02/06/2016 2.5  <=5.0 Ratio Final  . VLDL 02/06/2016 13  <30 mg/dL Final  . LDL Cholesterol 02/06/2016 36  <130 mg/dL Final   Comment:   Total Cholesterol/HDL Ratio:CHD Risk  Coronary Heart Disease Risk Table                                        Men       Women          1/2 Average Risk              3.4        3.3              Average Risk              5.0        4.4           2X Average Risk              9.6        7.1           3X Average Risk             23.4       11.0 Use the calculated Patient Ratio above and the CHD Risk table  to determine the patient's CHD Risk.   Marland Kitchen PSA 02/06/2016 0.44  <=4.00 ng/mL Final   Comment: Test Methodology: ECLIA PSA (Electrochemiluminescence Immunoassay)   For PSA values from 2.5-4.0, particularly in younger men <69 years old, the AUA and NCCN suggest testing for % Free PSA (3515) and evaluation of the rate of increase in PSA (PSA velocity).     Past Medical History  Diagnosis Date  . Hypertension   . Hyperlipidemia   . GERD (gastroesophageal reflux disease)   . CAD (coronary artery disease)   . Erectile dysfunction   . Mitral valve regurgitation   . Elevated PSA     . RLS (restless legs syndrome)    Past Surgical History  Procedure Laterality Date  . Ankle surgery    . Cholecystectomy    . Cholecystectomy     Current Outpatient Prescriptions on File Prior to Visit  Medication Sig Dispense Refill  . aspirin 325 MG tablet Take 325 mg by mouth daily.    Marland Kitchen atorvastatin (LIPITOR) 40 MG tablet Take 1 tablet (40 mg total) by mouth daily. 90 tablet 3  . chlorpheniramine (ALLERGY) 4 MG tablet Take 4 mg by mouth daily as needed.    . finasteride (PROSCAR) 5 MG tablet Take 1 tablet (5 mg total) by mouth daily. 90 tablet 3  . metoprolol tartrate (LOPRESSOR) 25 MG tablet Take 1 tablet (25 mg total) by mouth 2 (two) times daily. 180 tablet 3  . metoprolol tartrate (LOPRESSOR) 25 MG tablet TAKE 1 TABLET BY MOUTH TWICE DAILY 180 tablet 4  . nitroGLYCERIN (NITROSTAT) 0.4 MG SL tablet Place 0.4 mg under the tongue every 5 (five) minutes as needed.    . pantoprazole (PROTONIX) 40 MG tablet Take 1 tablet (40 mg total) by mouth 2 (two) times daily. 60 tablet 11  . pramipexole (MIRAPEX) 0.25 MG tablet TAKE 2 TABLETS(0.5 MG) BY MOUTH AT BEDTIME 60 tablet 11  . predniSONE (DELTASONE) 20 MG tablet Take 3 daily for 2 days, then 2 daily for 2 days, then 1 daily for 2 days. 12 tablet 0  . Probiotic Product (PROBIOTIC FORMULA PO) Take 1 tablet by mouth daily.    . tadalafil (CIALIS) 5 MG tablet Take 1 tablet (5 mg total) by mouth daily. 30 tablet 4  . tiZANidine (ZANAFLEX) 4  MG tablet Take 1 tablet (4 mg total) by mouth every 8 (eight) hours as needed for muscle spasms. 30 tablet 1  . valACYclovir (VALTREX) 1000 MG tablet Take 1 tablet (1,000 mg total) by mouth daily. 30 tablet 1   No current facility-administered medications on file prior to visit.   No Known Allergies Social History   Social History  . Marital Status: Married    Spouse Name: N/A  . Number of Children: N/A  . Years of Education: N/A   Occupational History  . RETIRED    Social History Main Topics   . Smoking status: Former Smoker -- 0.50 packs/day for 15 years    Types: Cigarettes    Quit date: 10/01/1988  . Smokeless tobacco: Never Used  . Alcohol Use: No  . Drug Use: No  . Sexual Activity: Yes     Comment: married, 2 grown sons, retired maintenance man   Other Topics Concern  . Not on file   Social History Narrative   Family History  Problem Relation Age of Onset  . Diabetes    . Breast cancer    . Coronary artery disease        Review of Systems  All other systems reviewed and are negative.      Objective:   Physical Exam  Constitutional: He is oriented to person, place, and time. He appears well-developed and well-nourished. No distress.  HENT:  Head: Normocephalic and atraumatic.  Right Ear: External ear normal.  Left Ear: External ear normal.  Nose: Nose normal.  Mouth/Throat: Oropharynx is clear and moist. No oropharyngeal exudate.  Eyes: Conjunctivae and EOM are normal. Pupils are equal, round, and reactive to light. Right eye exhibits no discharge. Left eye exhibits no discharge. No scleral icterus.  Neck: Normal range of motion. Neck supple. No JVD present. No tracheal deviation present. No thyromegaly present.  Cardiovascular: Normal rate, regular rhythm, normal heart sounds and intact distal pulses.  Exam reveals no gallop and no friction rub.   No murmur heard. Pulmonary/Chest: Effort normal and breath sounds normal. No stridor. No respiratory distress. He has no wheezes. He has no rales. He exhibits no tenderness.  Abdominal: Soft. Bowel sounds are normal. He exhibits no distension and no mass. There is no tenderness. There is no rebound and no guarding.  Musculoskeletal: Normal range of motion. He exhibits no edema or tenderness.  Lymphadenopathy:    He has no cervical adenopathy.  Neurological: He is alert and oriented to person, place, and time. He has normal reflexes. No cranial nerve deficit. He exhibits normal muscle tone. Coordination normal.   Skin: Skin is warm. No rash noted. He is not diaphoretic. No erythema. No pallor.  Psychiatric: He has a normal mood and affect. His behavior is normal. Judgment and thought content normal.  Vitals reviewed.         Assessment & Plan:  Routine general medical examination at a health care facility - Plan: atorvastatin (LIPITOR) 40 MG tablet  Physical exam is completely normal. Blood pressure is excellent. Cholesterol is outstanding. Given his history of ASCVD however I will keep him on Lipitor. I strongly encouraged him to stay on metoprolol. Immunizations are up-to-date. Cancer screening is up-to-date. I see screening. He declines HIV screening. Regular preventative care is up-to-date

## 2016-04-04 ENCOUNTER — Telehealth: Payer: Self-pay | Admitting: Family Medicine

## 2016-04-04 DIAGNOSIS — I2510 Atherosclerotic heart disease of native coronary artery without angina pectoris: Secondary | ICD-10-CM

## 2016-04-04 NOTE — Telephone Encounter (Signed)
Pt calling.  Tried to make follow up appt with Dr [facility].  Was told has been long enough he needs new referral.  Referral placed.

## 2016-04-19 DIAGNOSIS — C4492 Squamous cell carcinoma of skin, unspecified: Secondary | ICD-10-CM

## 2016-04-19 DIAGNOSIS — Z1283 Encounter for screening for malignant neoplasm of skin: Secondary | ICD-10-CM | POA: Diagnosis not present

## 2016-04-19 DIAGNOSIS — L820 Inflamed seborrheic keratosis: Secondary | ICD-10-CM | POA: Diagnosis not present

## 2016-04-19 DIAGNOSIS — D0439 Carcinoma in situ of skin of other parts of face: Secondary | ICD-10-CM | POA: Diagnosis not present

## 2016-04-19 DIAGNOSIS — L578 Other skin changes due to chronic exposure to nonionizing radiation: Secondary | ICD-10-CM | POA: Diagnosis not present

## 2016-04-19 DIAGNOSIS — I781 Nevus, non-neoplastic: Secondary | ICD-10-CM | POA: Diagnosis not present

## 2016-04-19 DIAGNOSIS — Z85828 Personal history of other malignant neoplasm of skin: Secondary | ICD-10-CM | POA: Diagnosis not present

## 2016-04-19 DIAGNOSIS — L821 Other seborrheic keratosis: Secondary | ICD-10-CM | POA: Diagnosis not present

## 2016-04-19 DIAGNOSIS — L814 Other melanin hyperpigmentation: Secondary | ICD-10-CM | POA: Diagnosis not present

## 2016-04-19 DIAGNOSIS — L219 Seborrheic dermatitis, unspecified: Secondary | ICD-10-CM | POA: Diagnosis not present

## 2016-04-19 DIAGNOSIS — L570 Actinic keratosis: Secondary | ICD-10-CM | POA: Diagnosis not present

## 2016-04-19 DIAGNOSIS — I8393 Asymptomatic varicose veins of bilateral lower extremities: Secondary | ICD-10-CM | POA: Diagnosis not present

## 2016-04-19 DIAGNOSIS — D485 Neoplasm of uncertain behavior of skin: Secondary | ICD-10-CM | POA: Diagnosis not present

## 2016-04-19 HISTORY — DX: Squamous cell carcinoma of skin, unspecified: C44.92

## 2016-05-01 DIAGNOSIS — R351 Nocturia: Secondary | ICD-10-CM | POA: Diagnosis not present

## 2016-05-01 DIAGNOSIS — N5201 Erectile dysfunction due to arterial insufficiency: Secondary | ICD-10-CM | POA: Diagnosis not present

## 2016-05-01 DIAGNOSIS — R972 Elevated prostate specific antigen [PSA]: Secondary | ICD-10-CM | POA: Diagnosis not present

## 2016-05-01 DIAGNOSIS — N486 Induration penis plastica: Secondary | ICD-10-CM | POA: Diagnosis not present

## 2016-05-01 DIAGNOSIS — N401 Enlarged prostate with lower urinary tract symptoms: Secondary | ICD-10-CM | POA: Diagnosis not present

## 2016-05-05 ENCOUNTER — Other Ambulatory Visit: Payer: Self-pay | Admitting: Family Medicine

## 2016-06-14 DIAGNOSIS — D0439 Carcinoma in situ of skin of other parts of face: Secondary | ICD-10-CM | POA: Diagnosis not present

## 2016-07-10 ENCOUNTER — Ambulatory Visit (INDEPENDENT_AMBULATORY_CARE_PROVIDER_SITE_OTHER): Payer: Medicare Other | Admitting: Cardiovascular Disease

## 2016-07-10 ENCOUNTER — Encounter: Payer: Self-pay | Admitting: Cardiovascular Disease

## 2016-07-10 VITALS — BP 118/54 | HR 42 | Ht 75.0 in | Wt 208.6 lb

## 2016-07-10 DIAGNOSIS — K219 Gastro-esophageal reflux disease without esophagitis: Secondary | ICD-10-CM | POA: Diagnosis not present

## 2016-07-10 DIAGNOSIS — I10 Essential (primary) hypertension: Secondary | ICD-10-CM | POA: Diagnosis not present

## 2016-07-10 DIAGNOSIS — I2510 Atherosclerotic heart disease of native coronary artery without angina pectoris: Secondary | ICD-10-CM | POA: Diagnosis not present

## 2016-07-10 DIAGNOSIS — E785 Hyperlipidemia, unspecified: Secondary | ICD-10-CM | POA: Diagnosis not present

## 2016-07-10 MED ORDER — METOPROLOL TARTRATE 25 MG PO TABS: ORAL_TABLET | ORAL | 11 refills | Status: DC

## 2016-07-10 NOTE — Patient Instructions (Signed)
Your physician has requested that you have an echocardiogram. Echocardiography is a painless test that uses sound waves to create images of your heart. It provides your doctor with information about the size and shape of your heart and how well your heart's chambers and valves are working. This procedure takes approximately one hour. There are no restrictions for this procedure.  Your physician wants you to follow-up in: 1 year or sooner if needed. You will receive a reminder letter in the mail two months in advance. If you don't receive a letter, please call our office to schedule the follow-up appointment.  If you need a refill on your cardiac medications before your next appointment, please call your pharmacy.

## 2016-07-11 NOTE — Progress Notes (Signed)
Primary MD: Dr. Jenna Luo  PATIENT PROFILE: Gregory Suarez is a 70 y.o. male who is referred through the courtesy of Dr. [name] for cardiology evaluation.  I had seen the patient remotely over 15 years ago.   HPI:  Gregory Suarez is a former patient of Dr. Joni Fears.  He tells me he underwent heart catheterization at least 17 years ago and was told of having mild blockage which medical therapy was recommended.  He is retired from city.  He has a long history of sleep apnea and is on his third CPAP machine.  He believes his DME company is [employer] [facility].  He has a history of significant Beason.  He states that over the past year, he purposely lost approximately 50 pounds.  He denies any exertional chest pain but admits to intermittent left sharp, achy discomfort which is nonexertional.  He denies associated palpitations or shortness of breath.  He admits to leg swelling.  He wears a {on his ankle.  He had recently seen Dr. Earl Lagos and since he had not had a cardiology evaluation in many years.  He is referred for cardiology reevaluation.  Past Medical History:  Diagnosis Date  . CAD (coronary artery disease)   . Elevated PSA   . Erectile dysfunction   . GERD (gastroesophageal reflux disease)   . Hyperlipidemia   . Hypertension   . Mitral valve regurgitation   . RLS (restless legs syndrome)     Past Surgical History:  Procedure Laterality Date  . ANKLE SURGERY    . CHOLECYSTECTOMY    . CHOLECYSTECTOMY      No Known Allergies  Current Outpatient Prescriptions  Medication Sig Dispense Refill  . aspirin 325 MG tablet Take 325 mg by mouth daily.    Marland Kitchen atorvastatin (LIPITOR) 40 MG tablet Take 1 tablet (40 mg total) by mouth daily. 90 tablet 3  . chlorpheniramine (ALLERGY) 4 MG tablet Take 4 mg by mouth daily as needed.    . finasteride (PROSCAR) 5 MG tablet Take 1 tablet (5 mg total) by mouth daily. 90 tablet 3  . metoprolol tartrate (LOPRESSOR) 25 MG tablet Take 1 tablet in  the morning and 1/2 tablet in the evening. 45 tablet 11  . nitroGLYCERIN (NITROSTAT) 0.4 MG SL tablet Place 1 tablet (0.4 mg total) under the tongue every 5 (five) minutes as needed. 30 tablet 1  . pantoprazole (PROTONIX) 40 MG tablet Take 1 tablet (40 mg total) by mouth 2 (two) times daily. 60 tablet 11  . pramipexole (MIRAPEX) 0.25 MG tablet TAKE 2 TABLETS(0.5 MG) BY MOUTH AT BEDTIME 60 tablet 5  . Probiotic Product (PROBIOTIC FORMULA PO) Take 1 tablet by mouth daily.    . tadalafil (CIALIS) 5 MG tablet Take 1 tablet (5 mg total) by mouth daily. 30 tablet 4  . tiZANidine (ZANAFLEX) 4 MG tablet Take 1 tablet (4 mg total) by mouth every 8 (eight) hours as needed for muscle spasms. 30 tablet 1  . valACYclovir (VALTREX) 1000 MG tablet Take 1 tablet (1,000 mg total) by mouth daily. 30 tablet 1   No current facility-administered medications for this visit.     Social History   Social History  . Marital status: Married    Spouse name: N/A  . Number of children: N/A  . Years of education: N/A   Occupational History  . RETIRED    Social History Main Topics  . Smoking status: Former Smoker    Packs/day: 0.50  Years: 15.00    Types: Cigarettes    Quit date: 10/01/1988  . Smokeless tobacco: Never Used  . Alcohol use No  . Drug use: No  . Sexual activity: Yes     Comment: married, 2 grown sons, retired maintenance man   Other Topics Concern  . Not on file   Social History Narrative  . No narrative on file   Additional social history is notable that he is married for 44 years.  He has 2 children and 4 grandchildren.  He previously worked as a Conservation officer, nature gentleman.  He remotely smoked but quit in 1990.  He is not routinely exercise.  Family History  Problem Relation Age of Onset  . Diabetes    . Breast cancer    . Coronary artery disease     Family history is notable that his mother died at 45 with cancer.  Father had heart disease and diabetes mellitus.  He has 3  brothers, one had heart valve replacement in 2017 and another had undergone CABG revascularization surgery.  He has 2 living sisters and one sister died in an accident.  ROS General: Negative; No fevers, chills, or night sweats HEENT: Negative; No changes in vision or hearing, sinus congestion, difficulty swallowing Pulmonary: Negative; No cough, wheezing, shortness of breath, hemoptysis Cardiovascular:  See HPI; No chest pain, presyncope, syncope, palpitations, edema GI: Negative; No nausea, vomiting, diarrhea, or abdominal pain GU: Negative; No dysuria, hematuria, or difficulty voiding Musculoskeletal: Negative; no myalgias, joint pain, or weakness Hematologic/Oncologic: Negative; no easy bruising, bleeding Endocrine: Negative; no heat/cold intolerance; no diabetes Neuro: Negative; no changes in balance, headaches Skin: Negative; No rashes or skin lesions Psychiatric: Negative; No behavioral problems, depression Sleep: Positive for obstructive sleep apnea on his third CPAP machine.  No daytime sleepiness, hypersomnolence, bruxism, restless legs, hypnogagnic hallucinations Other comprehensive 14 point system review is negative   Physical Exam BP (!) 118/54 (BP Location: Left Arm, Patient Position: Sitting, Cuff Size: Normal)   Pulse (!) 42   Ht '6\' 3"'  (1.905 m)   Wt 208 lb 9.6 oz (94.6 kg)   BMI 26.07 kg/m   Wt Readings from Last 3 Encounters:  07/10/16 208 lb 9.6 oz (94.6 kg)  02/09/16 203 lb (92.1 kg)  10/19/15 242 lb (109.8 kg)   General: Alert, oriented, no distress.  Skin: normal turgor, no rashes, warm and dry HEENT: Normocephalic, atraumatic. Pupils equal round and reactive to light; sclera anicteric; extraocular muscles intact; Fundi Without hemorrhages or exudates. Nose without nasal septal hypertrophy Mouth/Parynx benign; Mallinpatti scale 2 Neck: No JVD, no carotid bruits; normal carotid upstroke Lungs: clear to ausculatation and percussion; no wheezing or  rales Chest wall: without tenderness to palpitation Heart: PMI not displaced, RRR, s1 s2 normal, trivial if any systolic murmur, no diastolic murmur, no rubs, gallops, thrills, or heaves Abdomen: soft, nontender; no hepatosplenomehaly, BS+; abdominal aorta nontender and not dilated by palpation. Back: no CVA tenderness Pulses 2+ Musculoskeletal: full range of motion, normal strength, no joint deformities Extremities: Left ankle brace.  Trace edema of his ankles.  Homan's sign negative  Neurologic: grossly nonfocal; Cranial nerves grossly wnl Psychologic: Normal mood and affect   ECG (independently read by me): Marked sinus bradycardia at 42 bpm.  No ectopy.  LABS:  BMP Latest Ref Rng & Units 02/06/2016 01/28/2015 01/18/2014  Glucose 70 - 99 mg/dL 89 89 86  BUN 7 - 25 mg/dL '12 17 18  ' Creatinine 0.70 - 1.25 mg/dL 0.71 0.92  0.91  Sodium 135 - 146 mmol/L 141 139 142  Potassium 3.5 - 5.3 mmol/L 3.9 4.5 4.5  Chloride 98 - 110 mmol/L 105 104 105  CO2 20 - 31 mmol/L '29 25 28  ' Calcium 8.6 - 10.3 mg/dL 8.6 9.3 8.8     Hepatic Function Latest Ref Rng & Units 02/06/2016 01/28/2015 01/18/2014  Total Protein 6.1 - 8.1 g/dL 6.0(L) 7.3 6.5  Albumin 3.6 - 5.1 g/dL 3.4(L) 3.9 3.6  AST 10 - 35 U/L '29 23 20  ' ALT 9 - 46 U/L '24 16 18  ' Alk Phosphatase 40 - 115 U/L 115 85 111  Total Bilirubin 0.2 - 1.2 mg/dL 0.7 1.0 0.6    CBC Latest Ref Rng & Units 02/06/2016 01/28/2015 01/18/2014  WBC 3.8 - 10.8 K/uL 4.7 5.7 6.6  Hemoglobin 13.0 - 17.0 g/dL 14.4 16.0 15.1  Hematocrit 38.5 - 50.0 % 42.8 47.4 44.2  Platelets 140 - 400 K/uL 163 218 177   Lab Results  Component Value Date   MCV 91.8 02/06/2016   MCV 88.1 01/28/2015   MCV 85.8 01/18/2014   Lab Results  Component Value Date   TSH 1.278 01/28/2015   No results found for: HGBA1C   BNP No results found for: BNP  ProBNP No results found for: PROBNP   Lipid Panel     Component Value Date/Time   CHOL 82 (L) 02/06/2016 0843   TRIG 66 02/06/2016  0843   HDL 33 (L) 02/06/2016 0843   CHOLHDL 2.5 02/06/2016 0843   VLDL 13 02/06/2016 0843   LDLCALC 36 02/06/2016 0843    RADIOLOGY: No results found.   ASSESSMENT AND PLAN: Mr. Davonn management is a 70 year old Caucasian male who has previously documented mild CAD.  At cardiac catheterization some time between 17 or 20 years ago.  He denies any classic exertional symptoms of angina.  He admits to some vague left-sided sharp chest ache which is neck nonexertional.  I reviewed his recent blood work done in May by Dr. [name].  He has been on atorvastatin 40 mg for hyperlipidemia and his LDL was excellent at 36 with a cholesterol of 82 and HDL of 33 and triglycerides of 66.  His blood pressure today is stable and he has been taking metoprolol 25 mg twice a day.  He is asymptomatic with reference to his marked sinus bradycardia, but with his pulse at 42.  I have suggested reducing his metoprolol and take 25 mg in the morning and 12.5 mg at night.  If he still notes resting pulse is in the 40s he will reduce it to 12.5 mg twice a day.  He has been taking full dose aspirin 325 mg and I suggested he decrease this to 81 mg.  He has GERD for which he takes pantoprazole.  He admits to 100% compliance with CPAP therapy.  He does note some intermittent leg swelling and he wears a left ankle.  I am recommending that he undergo a 2-D echo Doppler study for further evaluation of structural heart disease.  He is not having anginal symptoms and does not walk well due to wearing his left ankle brace.  I have suggested support hose to potentially reduce swelling.  He would not be able to do a treadmill test due to his left ankle brace.  At this point he is not having any symptoms to warrant a nuclear stress test.  I will contact him results of his echo Doppler study.  As long as he  remains stable I will see him in one year for reevaluation.    Troy Sine, MD, [facility] 07/11/2016 6:55 PM

## 2016-08-22 ENCOUNTER — Telehealth: Payer: Self-pay | Admitting: Cardiovascular Disease

## 2016-08-22 NOTE — Telephone Encounter (Signed)
Spoke with patient to inform him that I will send in a order for new CPAP machine w/supplies. I cannot guarantee they will be given a new machine. The patient states that he left the machine in [place] since it stopped working. I told the patient they may require a office note from the doctor saying his machine is non functional. Dr Claiborne Billings cannot attest to this since his machine was apparently functional when he had his October office visit. They made need more than what I have on file. Order for CPAP/ supplies faxed to [facility] to # provided.

## 2016-08-22 NOTE — Telephone Encounter (Signed)
I cannot locate a copy of sleep study report in chart. Mariann Laster, can you review this?

## 2016-08-22 NOTE — Telephone Encounter (Signed)
New message  Pt call requesting to speak with RN about getting an order for pt to receive a CPAP machine. pt wife states pt completed sleep study.  Please call back to discuss

## 2016-08-22 NOTE — Telephone Encounter (Signed)
Returned call to patient. he states he is eligible for new machine needs new order for machine and supplies  [facility] fax: [phone] patient states per Medicare, they do NOT need him to complete a new sleep study. he will be able to get machine & supplies, just needs order.  requests call one way or another once something has been sent.

## 2016-09-06 ENCOUNTER — Encounter: Payer: Self-pay | Admitting: Family Medicine

## 2016-09-07 ENCOUNTER — Telehealth: Payer: Self-pay | Admitting: Cardiovascular Disease

## 2016-09-07 NOTE — Telephone Encounter (Signed)
Pt called about the Status of his C PAP machine.   Please give him a call back.

## 2016-09-10 ENCOUNTER — Ambulatory Visit (HOSPITAL_COMMUNITY): Payer: Medicare Other | Attending: Cardiology

## 2016-09-10 ENCOUNTER — Other Ambulatory Visit: Payer: Self-pay

## 2016-09-10 ENCOUNTER — Telehealth: Payer: Self-pay | Admitting: *Deleted

## 2016-09-10 DIAGNOSIS — I10 Essential (primary) hypertension: Secondary | ICD-10-CM | POA: Diagnosis not present

## 2016-09-10 DIAGNOSIS — E785 Hyperlipidemia, unspecified: Secondary | ICD-10-CM | POA: Diagnosis not present

## 2016-09-10 DIAGNOSIS — I2510 Atherosclerotic heart disease of native coronary artery without angina pectoris: Secondary | ICD-10-CM | POA: Diagnosis not present

## 2016-09-10 DIAGNOSIS — Z87891 Personal history of nicotine dependence: Secondary | ICD-10-CM | POA: Insufficient documentation

## 2016-09-10 NOTE — Telephone Encounter (Signed)
Called [facility] and spoke with Keeva to get the status of the patient's new BIPAP machine. She informs me that they have tried to contact the patient to do his intake, but have not made contact with him. I also informed her that I need for the order to be changed to a BIPAP. The order that I sent in was for a CPAP. She informs me that she will correct it in the system. I will not need to sed in a corrected order.

## 2016-09-19 ENCOUNTER — Encounter: Payer: Self-pay | Admitting: *Deleted

## 2016-10-16 ENCOUNTER — Telehealth: Payer: Self-pay | Admitting: Cardiovascular Disease

## 2016-10-16 NOTE — Telephone Encounter (Signed)
New Message  Pt voiced calling about cpap and needing bpap information.  Please f/u

## 2016-11-01 ENCOUNTER — Telehealth: Payer: Self-pay | Admitting: *Deleted

## 2016-11-01 NOTE — Telephone Encounter (Signed)
Positive airways pressure/respiratory assist device and supplies order faxed

## 2016-11-01 NOTE — Telephone Encounter (Signed)
New message      Calling to get [place] to fax update presc for bipap machine. Presc need to have  auto pressure setting on it and it needs to say bipap instead of cpap.  Please fax to [phone].

## 2016-11-01 NOTE — Telephone Encounter (Signed)
Spoke to Customer service manager ( Catering manager with company)  need a complete prescription for Bi-PAP.  information received  and discussed with Gregory Suarez CMA   PRESCRIPTION WAS FAXED TO GIVEN  FAX--# [phone]

## 2016-12-05 ENCOUNTER — Other Ambulatory Visit: Payer: Self-pay | Admitting: Family Medicine

## 2016-12-19 ENCOUNTER — Other Ambulatory Visit: Payer: Self-pay | Admitting: Family Medicine

## 2016-12-19 MED ORDER — PRAMIPEXOLE DIHYDROCHLORIDE 0.25 MG PO TABS: ORAL_TABLET | ORAL | 1 refills | Status: DC

## 2017-01-09 ENCOUNTER — Other Ambulatory Visit: Payer: Self-pay | Admitting: Family Medicine

## 2017-01-10 ENCOUNTER — Other Ambulatory Visit: Payer: Self-pay | Admitting: Family Medicine

## 2017-01-10 DIAGNOSIS — Z0000 Encounter for general adult medical examination without abnormal findings: Secondary | ICD-10-CM

## 2017-02-05 ENCOUNTER — Other Ambulatory Visit: Payer: Self-pay | Admitting: Family Medicine

## 2017-02-05 DIAGNOSIS — I10 Essential (primary) hypertension: Secondary | ICD-10-CM

## 2017-02-05 DIAGNOSIS — Z0000 Encounter for general adult medical examination without abnormal findings: Secondary | ICD-10-CM

## 2017-02-05 DIAGNOSIS — E785 Hyperlipidemia, unspecified: Secondary | ICD-10-CM

## 2017-02-05 DIAGNOSIS — Z125 Encounter for screening for malignant neoplasm of prostate: Secondary | ICD-10-CM

## 2017-02-05 DIAGNOSIS — I25119 Atherosclerotic heart disease of native coronary artery with unspecified angina pectoris: Secondary | ICD-10-CM

## 2017-02-05 DIAGNOSIS — Z79899 Other long term (current) drug therapy: Secondary | ICD-10-CM

## 2017-02-07 ENCOUNTER — Other Ambulatory Visit: Payer: Medicare Other

## 2017-02-07 DIAGNOSIS — Z125 Encounter for screening for malignant neoplasm of prostate: Secondary | ICD-10-CM

## 2017-02-07 DIAGNOSIS — I25119 Atherosclerotic heart disease of native coronary artery with unspecified angina pectoris: Secondary | ICD-10-CM | POA: Diagnosis not present

## 2017-02-07 DIAGNOSIS — Z79899 Other long term (current) drug therapy: Secondary | ICD-10-CM | POA: Diagnosis not present

## 2017-02-07 DIAGNOSIS — E785 Hyperlipidemia, unspecified: Secondary | ICD-10-CM

## 2017-02-07 DIAGNOSIS — I10 Essential (primary) hypertension: Secondary | ICD-10-CM | POA: Diagnosis not present

## 2017-02-07 DIAGNOSIS — Z0000 Encounter for general adult medical examination without abnormal findings: Secondary | ICD-10-CM | POA: Diagnosis not present

## 2017-02-07 LAB — CBC WITH DIFFERENTIAL/PLATELET
Basophils Absolute: 0 cells/uL (ref 0–200)
Basophils Relative: 0 %
Eosinophils Absolute: 207 cells/uL (ref 15–500)
Eosinophils Relative: 3 %
HCT: 46.5 % (ref 38.5–50.0)
Hemoglobin: 15.7 g/dL (ref 13.0–17.0)
Lymphocytes Relative: 17 %
Lymphs Abs: 1173 cells/uL (ref 850–3900)
MCH: 30.8 pg (ref 27.0–33.0)
MCHC: 33.8 g/dL (ref 32.0–36.0)
MCV: 91.4 fL (ref 80.0–100.0)
MPV: 9.3 fL (ref 7.5–12.5)
Monocytes Absolute: 483 cells/uL (ref 200–950)
Monocytes Relative: 7 %
Neutro Abs: 5037 cells/uL (ref 1500–7800)
Neutrophils Relative %: 73 %
Platelets: 172 10*3/uL (ref 140–400)
RBC: 5.09 MIL/uL (ref 4.20–5.80)
RDW: 14.2 % (ref 11.0–15.0)
WBC: 6.9 10*3/uL (ref 3.8–10.8)

## 2017-02-07 LAB — COMPLETE METABOLIC PANEL WITH GFR
ALT: 13 U/L (ref 9–46)
AST: 19 U/L (ref 10–35)
Albumin: 4 g/dL (ref 3.6–5.1)
Alkaline Phosphatase: 83 U/L (ref 40–115)
BUN: 15 mg/dL (ref 7–25)
CO2: 23 mmol/L (ref 20–31)
Calcium: 9.1 mg/dL (ref 8.6–10.3)
Chloride: 105 mmol/L (ref 98–110)
Creat: 0.96 mg/dL (ref 0.70–1.18)
GFR, Est African American: >89 mL/min (ref >=60)
GFR, Est Non African American: 80 mL/min (ref >=60)
Glucose, Bld: 83 mg/dL (ref 70–99)
Potassium: 4 mmol/L (ref 3.5–5.3)
Sodium: 141 mmol/L (ref 135–146)
Total Bilirubin: 1 mg/dL (ref 0.2–1.2)
Total Protein: 6.8 g/dL (ref 6.1–8.1)

## 2017-02-07 LAB — LIPID PANEL
Cholesterol: 113 mg/dL (ref <200)
HDL: 42 mg/dL (ref >40)
LDL Cholesterol: 53 mg/dL (ref <100)
Total CHOL/HDL Ratio: 2.7 Ratio (ref <5.0)
Triglycerides: 91 mg/dL (ref <150)
VLDL: 18 mg/dL (ref <30)

## 2017-02-07 LAB — PSA: PSA: 0.6 ng/mL (ref <=4.0)

## 2017-02-07 LAB — TSH: TSH: 1.89 mIU/L (ref 0.40–4.50)

## 2017-02-11 ENCOUNTER — Encounter: Payer: Self-pay | Admitting: Family Medicine

## 2017-02-11 ENCOUNTER — Ambulatory Visit (INDEPENDENT_AMBULATORY_CARE_PROVIDER_SITE_OTHER): Payer: Medicare Other | Admitting: Family Medicine

## 2017-02-11 VITALS — BP 110/68 | HR 72 | Temp 98.1°F | Resp 16 | Ht 75.0 in | Wt 228.0 lb

## 2017-02-11 DIAGNOSIS — Z0000 Encounter for general adult medical examination without abnormal findings: Secondary | ICD-10-CM | POA: Diagnosis not present

## 2017-02-11 DIAGNOSIS — E781 Pure hyperglyceridemia: Secondary | ICD-10-CM

## 2017-02-11 DIAGNOSIS — I10 Essential (primary) hypertension: Secondary | ICD-10-CM | POA: Diagnosis not present

## 2017-02-11 DIAGNOSIS — I2510 Atherosclerotic heart disease of native coronary artery without angina pectoris: Secondary | ICD-10-CM

## 2017-02-11 NOTE — Progress Notes (Signed)
Subjective:    Patient ID: Gregory Suarez, male    DOB: March 27, 1946, 71 y.o.   MRN: [id number]  HPI Patient is here today for complete physical exam. His colonoscopy is due this year.  Immunization History  Administered Date(s) Administered  . Pneumococcal Conjugate-13 01/16/2014  . Pneumococcal Polysaccharide-23 02/04/2015  . Td 03/31/2009  . Tdap 05/24/2009  . Zoster 07/06/2011   Patient sees a urologist every year for digital rectal exam. His PSA today is excellent. He declines HIV and hepatitis C screening. His most recent lab work is listed below:  Appointment on 02/07/2017  Component Date Value Ref Range Status  . Sodium 02/07/2017 141  135 - 146 mmol/L Final  . Potassium 02/07/2017 4.0  3.5 - 5.3 mmol/L Final  . Chloride 02/07/2017 105  98 - 110 mmol/L Final  . CO2 02/07/2017 23  20 - 31 mmol/L Final  . Glucose, Bld 02/07/2017 83  70 - 99 mg/dL Final  . BUN 02/07/2017 15  7 - 25 mg/dL Final  . Creat 02/07/2017 0.96  0.70 - 1.18 mg/dL Final   Comment:   For patients > or = 71 years of age: The upper reference limit for Creatinine is approximately 13% higher for people identified as African-American.     . Total Bilirubin 02/07/2017 1.0  0.2 - 1.2 mg/dL Final  . Alkaline Phosphatase 02/07/2017 83  40 - 115 U/L Final  . AST 02/07/2017 19  10 - 35 U/L Final  . ALT 02/07/2017 13  9 - 46 U/L Final  . Total Protein 02/07/2017 6.8  6.1 - 8.1 g/dL Final  . Albumin 02/07/2017 4.0  3.6 - 5.1 g/dL Final  . Calcium 02/07/2017 9.1  8.6 - 10.3 mg/dL Final  . GFR, Est African American 02/07/2017 >89  >=60 mL/min Final  . GFR, Est Non African American 02/07/2017 80  >=60 mL/min Final  . TSH 02/07/2017 1.89  0.40 - 4.50 mIU/L Final  . Cholesterol 02/07/2017 113  <200 mg/dL Final  . Triglycerides 02/07/2017 91  <150 mg/dL Final  . HDL 02/07/2017 42  >40 mg/dL Final  . Total CHOL/HDL Ratio 02/07/2017 2.7  <5.0 Ratio Final  . VLDL 02/07/2017 18  <30 mg/dL Final  . LDL Cholesterol  02/07/2017 53  <100 mg/dL Final  . WBC 02/07/2017 6.9  3.8 - 10.8 K/uL Final  . RBC 02/07/2017 5.09  4.20 - 5.80 MIL/uL Final  . Hemoglobin 02/07/2017 15.7  13.0 - 17.0 g/dL Final  . HCT 02/07/2017 46.5  38.5 - 50.0 % Final  . MCV 02/07/2017 91.4  80.0 - 100.0 fL Final  . MCH 02/07/2017 30.8  27.0 - 33.0 pg Final  . MCHC 02/07/2017 33.8  32.0 - 36.0 g/dL Final  . RDW 02/07/2017 14.2  11.0 - 15.0 % Final  . Platelets 02/07/2017 172  140 - 400 K/uL Final  . MPV 02/07/2017 9.3  7.5 - 12.5 fL Final  . Neutro Abs 02/07/2017 5037  1,500 - 7,800 cells/uL Final  . Lymphs Abs 02/07/2017 1173  850 - 3,900 cells/uL Final  . Monocytes Absolute 02/07/2017 483  200 - 950 cells/uL Final  . Eosinophils Absolute 02/07/2017 207  15 - 500 cells/uL Final  . Basophils Absolute 02/07/2017 0  0 - 200 cells/uL Final  . Neutrophils Relative % 02/07/2017 73  % Final  . Lymphocytes Relative 02/07/2017 17  % Final  . Monocytes Relative 02/07/2017 7  % Final  . Eosinophils Relative 02/07/2017 3  % Final  .  Basophils Relative 02/07/2017 0  % Final  . Smear Review 02/07/2017 Criteria for review not met   Final  . PSA 02/07/2017 0.6  <=4.0 ng/mL Final   Comment:   The total PSA value from this assay system is standardized against the WHO standard. The test result will be approximately 20% lower when compared to the equimolar-standardized total PSA ([employer] Coul[employer]). Comparison of serial PSA results should be interpreted with this fact in mind.   This test was performed using the Siemens chemiluminescent method. Values obtained from different assay methods cannot be used interchangeably. PSA levels, regardless of value, should not be interpreted as absolute evidence of the presence or absence of disease.       Past Medical History:  Diagnosis Date  . CAD (coronary artery disease)   . Elevated PSA   . Erectile dysfunction   . GERD (gastroesophageal reflux disease)   . Hyperlipidemia   . Hypertension   .  Mitral valve regurgitation   . OSA treated with BiPAP   . RLS (restless legs syndrome)    Past Surgical History:  Procedure Laterality Date  . ANKLE SURGERY    . CHOLECYSTECTOMY    . CHOLECYSTECTOMY     Current Outpatient Prescriptions on File Prior to Visit  Medication Sig Dispense Refill  . aspirin 325 MG tablet Take 325 mg by mouth daily.    Marland Kitchen atorvastatin (LIPITOR) 40 MG tablet TAKE 1 TABLET BY MOUTH EVERY DAY 90 tablet 1  . chlorpheniramine (ALLERGY) 4 MG tablet Take 4 mg by mouth daily as needed.    . finasteride (PROSCAR) 5 MG tablet Take 1 tablet (5 mg total) by mouth daily. 90 tablet 3  . metoprolol tartrate (LOPRESSOR) 25 MG tablet TAKE 1 TABLET BY MOUTH TWICE A DAY 180 tablet 0  . nitroGLYCERIN (NITROSTAT) 0.4 MG SL tablet Place 1 tablet (0.4 mg total) under the tongue every 5 (five) minutes as needed. 30 tablet 1  . pantoprazole (PROTONIX) 40 MG tablet Take 1 tablet (40 mg total) by mouth 2 (two) times daily. 60 tablet 11  . pramipexole (MIRAPEX) 0.25 MG tablet TAKE 2 TABLETS BY MOUTH EVERY DAY AT BEDTIME 180 tablet 1  . Probiotic Product (PROBIOTIC FORMULA PO) Take 1 tablet by mouth daily.    . tadalafil (CIALIS) 5 MG tablet Take 1 tablet (5 mg total) by mouth daily. 30 tablet 4  . tiZANidine (ZANAFLEX) 4 MG tablet Take 1 tablet (4 mg total) by mouth every 8 (eight) hours as needed for muscle spasms. 30 tablet 1  . valACYclovir (VALTREX) 1000 MG tablet Take 1 tablet (1,000 mg total) by mouth daily. 30 tablet 1   No current facility-administered medications on file prior to visit.    No Known Allergies Social History   Social History  . Marital status: Married    Spouse name: N/A  . Number of children: N/A  . Years of education: N/A   Occupational History  . RETIRED    Social History Main Topics  . Smoking status: Former Smoker    Packs/day: 0.50    Years: 15.00    Types: Cigarettes    Quit date: 10/01/1988  . Smokeless tobacco: Never Used  . Alcohol use No  .  Drug use: No  . Sexual activity: Yes     Comment: married, 2 grown sons, retired maintenance man   Other Topics Concern  . Not on file   Social History Narrative  . No narrative on file  Family History  Problem Relation Age of Onset  . Diabetes Unknown   . Breast cancer Unknown   . Coronary artery disease Unknown       Review of Systems  All other systems reviewed and are negative.      Objective:   Physical Exam  Constitutional: He is oriented to person, place, and time. He appears well-developed and well-nourished. No distress.  HENT:  Head: Normocephalic and atraumatic.  Right Ear: External ear normal.  Left Ear: External ear normal.  Nose: Nose normal.  Mouth/Throat: Oropharynx is clear and moist. No oropharyngeal exudate.  Eyes: Conjunctivae and EOM are normal. Pupils are equal, round, and reactive to light. Right eye exhibits no discharge. Left eye exhibits no discharge. No scleral icterus.  Neck: Normal range of motion. Neck supple. No JVD present. No tracheal deviation present. No thyromegaly present.  Cardiovascular: Normal rate, regular rhythm, normal heart sounds and intact distal pulses.  Exam reveals no gallop and no friction rub.   No murmur heard. Pulmonary/Chest: Effort normal and breath sounds normal. No stridor. No respiratory distress. He has no wheezes. He has no rales. He exhibits no tenderness.  Abdominal: Soft. Bowel sounds are normal. He exhibits no distension and no mass. There is no tenderness. There is no rebound and no guarding.  Musculoskeletal: Normal range of motion. He exhibits no edema or tenderness.  Lymphadenopathy:    He has no cervical adenopathy.  Neurological: He is alert and oriented to person, place, and time. He has normal reflexes. No cranial nerve deficit. He exhibits normal muscle tone. Coordination normal.  Skin: Skin is warm. No rash noted. He is not diaphoretic. No erythema. No pallor.  Psychiatric: He has a normal mood and  affect. His behavior is normal. Judgment and thought content normal.  Vitals reviewed.         Assessment & Plan:  Gen med exam Physical exam is completely normal. Blood pressure is excellent. Cholesterol is outstanding. Given his history of ASCVD however I will keep him on Lipitor and metoprolol. Immunizations are up-to-date. Cancer screening is up-to-date.  Patient will schedule a colonoscopy when he comes back from [place] in September

## 2017-02-12 DIAGNOSIS — D0439 Carcinoma in situ of skin of other parts of face: Secondary | ICD-10-CM | POA: Diagnosis not present

## 2017-02-12 DIAGNOSIS — C4492 Squamous cell carcinoma of skin, unspecified: Secondary | ICD-10-CM

## 2017-02-12 DIAGNOSIS — Z85828 Personal history of other malignant neoplasm of skin: Secondary | ICD-10-CM | POA: Diagnosis not present

## 2017-02-12 HISTORY — DX: Squamous cell carcinoma of skin, unspecified: C44.92

## 2017-02-21 DIAGNOSIS — Z1211 Encounter for screening for malignant neoplasm of colon: Secondary | ICD-10-CM | POA: Diagnosis not present

## 2017-02-21 DIAGNOSIS — K5904 Chronic idiopathic constipation: Secondary | ICD-10-CM | POA: Diagnosis not present

## 2017-02-21 DIAGNOSIS — K219 Gastro-esophageal reflux disease without esophagitis: Secondary | ICD-10-CM | POA: Diagnosis not present

## 2017-03-05 ENCOUNTER — Other Ambulatory Visit: Payer: Self-pay | Admitting: Family Medicine

## 2017-04-06 ENCOUNTER — Other Ambulatory Visit: Payer: Self-pay | Admitting: Family Medicine

## 2017-04-29 DIAGNOSIS — L219 Seborrheic dermatitis, unspecified: Secondary | ICD-10-CM | POA: Diagnosis not present

## 2017-04-29 DIAGNOSIS — Z85828 Personal history of other malignant neoplasm of skin: Secondary | ICD-10-CM | POA: Diagnosis not present

## 2017-04-29 DIAGNOSIS — L821 Other seborrheic keratosis: Secondary | ICD-10-CM | POA: Diagnosis not present

## 2017-04-29 DIAGNOSIS — L853 Xerosis cutis: Secondary | ICD-10-CM | POA: Diagnosis not present

## 2017-04-29 DIAGNOSIS — L918 Other hypertrophic disorders of the skin: Secondary | ICD-10-CM | POA: Diagnosis not present

## 2017-04-29 DIAGNOSIS — B351 Tinea unguium: Secondary | ICD-10-CM | POA: Diagnosis not present

## 2017-04-29 DIAGNOSIS — D1800 Hemangioma unspecified site: Secondary | ICD-10-CM | POA: Diagnosis not present

## 2017-04-29 DIAGNOSIS — D229 Melanocytic nevi, unspecified: Secondary | ICD-10-CM | POA: Diagnosis not present

## 2017-04-29 DIAGNOSIS — L812 Freckles: Secondary | ICD-10-CM | POA: Diagnosis not present

## 2017-04-29 DIAGNOSIS — L578 Other skin changes due to chronic exposure to nonionizing radiation: Secondary | ICD-10-CM | POA: Diagnosis not present

## 2017-04-29 DIAGNOSIS — I8310 Varicose veins of unspecified lower extremity with inflammation: Secondary | ICD-10-CM | POA: Diagnosis not present

## 2017-06-05 DIAGNOSIS — Z1211 Encounter for screening for malignant neoplasm of colon: Secondary | ICD-10-CM | POA: Diagnosis not present

## 2017-06-05 DIAGNOSIS — Z1212 Encounter for screening for malignant neoplasm of rectum: Secondary | ICD-10-CM | POA: Diagnosis not present

## 2017-07-04 ENCOUNTER — Other Ambulatory Visit: Payer: Self-pay | Admitting: Family Medicine

## 2017-07-04 NOTE — Telephone Encounter (Signed)
rx refilled per protocol

## 2017-07-08 ENCOUNTER — Other Ambulatory Visit: Payer: Self-pay | Admitting: Family Medicine

## 2017-07-08 DIAGNOSIS — Z0000 Encounter for general adult medical examination without abnormal findings: Secondary | ICD-10-CM

## 2017-07-08 NOTE — Telephone Encounter (Signed)
Medication refilled per protocol. 

## 2017-07-16 DIAGNOSIS — N5201 Erectile dysfunction due to arterial insufficiency: Secondary | ICD-10-CM | POA: Diagnosis not present

## 2017-07-16 DIAGNOSIS — R350 Frequency of micturition: Secondary | ICD-10-CM | POA: Diagnosis not present

## 2017-07-16 DIAGNOSIS — N401 Enlarged prostate with lower urinary tract symptoms: Secondary | ICD-10-CM | POA: Diagnosis not present

## 2017-07-17 DIAGNOSIS — D122 Benign neoplasm of ascending colon: Secondary | ICD-10-CM | POA: Diagnosis not present

## 2017-07-17 DIAGNOSIS — D125 Benign neoplasm of sigmoid colon: Secondary | ICD-10-CM | POA: Diagnosis not present

## 2017-07-17 DIAGNOSIS — K635 Polyp of colon: Secondary | ICD-10-CM | POA: Diagnosis not present

## 2017-07-17 DIAGNOSIS — R195 Other fecal abnormalities: Secondary | ICD-10-CM | POA: Diagnosis not present

## 2017-07-17 DIAGNOSIS — D123 Benign neoplasm of transverse colon: Secondary | ICD-10-CM | POA: Diagnosis not present

## 2017-07-17 DIAGNOSIS — Z1211 Encounter for screening for malignant neoplasm of colon: Secondary | ICD-10-CM | POA: Diagnosis not present

## 2017-07-29 ENCOUNTER — Encounter: Payer: Self-pay | Admitting: Family Medicine

## 2017-07-29 DIAGNOSIS — D126 Benign neoplasm of colon, unspecified: Secondary | ICD-10-CM | POA: Insufficient documentation

## 2017-08-05 DIAGNOSIS — T7840XA Allergy, unspecified, initial encounter: Secondary | ICD-10-CM | POA: Diagnosis not present

## 2017-08-05 DIAGNOSIS — L578 Other skin changes due to chronic exposure to nonionizing radiation: Secondary | ICD-10-CM | POA: Diagnosis not present

## 2017-08-05 DIAGNOSIS — C44319 Basal cell carcinoma of skin of other parts of face: Secondary | ICD-10-CM | POA: Diagnosis not present

## 2017-08-05 DIAGNOSIS — L821 Other seborrheic keratosis: Secondary | ICD-10-CM | POA: Diagnosis not present

## 2017-08-05 DIAGNOSIS — D485 Neoplasm of uncertain behavior of skin: Secondary | ICD-10-CM | POA: Diagnosis not present

## 2017-08-05 DIAGNOSIS — Z85828 Personal history of other malignant neoplasm of skin: Secondary | ICD-10-CM | POA: Diagnosis not present

## 2017-08-05 DIAGNOSIS — L820 Inflamed seborrheic keratosis: Secondary | ICD-10-CM | POA: Diagnosis not present

## 2017-09-03 DIAGNOSIS — L578 Other skin changes due to chronic exposure to nonionizing radiation: Secondary | ICD-10-CM | POA: Diagnosis not present

## 2017-09-03 DIAGNOSIS — C44319 Basal cell carcinoma of skin of other parts of face: Secondary | ICD-10-CM | POA: Diagnosis not present

## 2017-10-09 ENCOUNTER — Other Ambulatory Visit: Payer: Self-pay | Admitting: Family Medicine

## 2017-10-21 ENCOUNTER — Other Ambulatory Visit: Payer: Self-pay | Admitting: Family Medicine

## 2017-10-21 DIAGNOSIS — Z0000 Encounter for general adult medical examination without abnormal findings: Secondary | ICD-10-CM

## 2017-12-13 ENCOUNTER — Other Ambulatory Visit: Payer: Self-pay | Admitting: Family Medicine

## 2017-12-13 MED ORDER — PANTOPRAZOLE SODIUM 40 MG PO TBEC: 40.0000 mg | DELAYED_RELEASE_TABLET | Freq: Two times a day (BID) | ORAL | 3 refills | Status: DC

## 2017-12-18 ENCOUNTER — Other Ambulatory Visit: Payer: Self-pay | Admitting: Family Medicine

## 2017-12-18 ENCOUNTER — Telehealth: Payer: Self-pay | Admitting: Family Medicine

## 2017-12-18 MED ORDER — PANTOPRAZOLE SODIUM 40 MG PO TBEC: 40.0000 mg | DELAYED_RELEASE_TABLET | Freq: Two times a day (BID) | ORAL | 3 refills | Status: DC

## 2017-12-18 NOTE — Telephone Encounter (Signed)
[facility] CALLING REGARDING A PRESCRIPTION FOR THIS PATIENT  [phone]

## 2017-12-19 NOTE — Telephone Encounter (Signed)
Called and spoke to [facility] - all questions answered

## 2018-04-23 ENCOUNTER — Other Ambulatory Visit: Payer: Self-pay | Admitting: Family Medicine

## 2018-04-29 DIAGNOSIS — M5441 Lumbago with sciatica, right side: Secondary | ICD-10-CM | POA: Diagnosis not present

## 2018-04-29 DIAGNOSIS — M461 Sacroiliitis, not elsewhere classified: Secondary | ICD-10-CM | POA: Diagnosis not present

## 2018-04-29 DIAGNOSIS — M9903 Segmental and somatic dysfunction of lumbar region: Secondary | ICD-10-CM | POA: Diagnosis not present

## 2018-04-29 DIAGNOSIS — M9904 Segmental and somatic dysfunction of sacral region: Secondary | ICD-10-CM | POA: Diagnosis not present

## 2018-04-30 ENCOUNTER — Other Ambulatory Visit: Payer: Self-pay

## 2018-05-01 DIAGNOSIS — M5441 Lumbago with sciatica, right side: Secondary | ICD-10-CM | POA: Diagnosis not present

## 2018-05-01 DIAGNOSIS — M461 Sacroiliitis, not elsewhere classified: Secondary | ICD-10-CM | POA: Diagnosis not present

## 2018-05-01 DIAGNOSIS — M9903 Segmental and somatic dysfunction of lumbar region: Secondary | ICD-10-CM | POA: Diagnosis not present

## 2018-05-01 DIAGNOSIS — M9904 Segmental and somatic dysfunction of sacral region: Secondary | ICD-10-CM | POA: Diagnosis not present

## 2018-05-02 DIAGNOSIS — M461 Sacroiliitis, not elsewhere classified: Secondary | ICD-10-CM | POA: Diagnosis not present

## 2018-05-02 DIAGNOSIS — M9904 Segmental and somatic dysfunction of sacral region: Secondary | ICD-10-CM | POA: Diagnosis not present

## 2018-05-02 DIAGNOSIS — M5441 Lumbago with sciatica, right side: Secondary | ICD-10-CM | POA: Diagnosis not present

## 2018-05-02 DIAGNOSIS — M9903 Segmental and somatic dysfunction of lumbar region: Secondary | ICD-10-CM | POA: Diagnosis not present

## 2018-05-14 ENCOUNTER — Other Ambulatory Visit: Payer: Self-pay | Admitting: Family Medicine

## 2018-05-14 DIAGNOSIS — Z0000 Encounter for general adult medical examination without abnormal findings: Secondary | ICD-10-CM

## 2018-06-26 DIAGNOSIS — J0100 Acute maxillary sinusitis, unspecified: Secondary | ICD-10-CM | POA: Diagnosis not present

## 2018-06-26 DIAGNOSIS — J309 Allergic rhinitis, unspecified: Secondary | ICD-10-CM | POA: Diagnosis not present

## 2018-07-17 ENCOUNTER — Other Ambulatory Visit: Payer: Medicare Other

## 2018-07-17 DIAGNOSIS — E785 Hyperlipidemia, unspecified: Secondary | ICD-10-CM

## 2018-07-17 DIAGNOSIS — Z125 Encounter for screening for malignant neoplasm of prostate: Secondary | ICD-10-CM

## 2018-07-17 DIAGNOSIS — K219 Gastro-esophageal reflux disease without esophagitis: Secondary | ICD-10-CM

## 2018-07-17 DIAGNOSIS — I10 Essential (primary) hypertension: Secondary | ICD-10-CM | POA: Diagnosis not present

## 2018-07-17 DIAGNOSIS — Z0000 Encounter for general adult medical examination without abnormal findings: Secondary | ICD-10-CM | POA: Diagnosis not present

## 2018-07-18 ENCOUNTER — Other Ambulatory Visit: Payer: Self-pay

## 2018-07-18 ENCOUNTER — Encounter: Payer: Self-pay | Admitting: Family Medicine

## 2018-07-18 ENCOUNTER — Ambulatory Visit (INDEPENDENT_AMBULATORY_CARE_PROVIDER_SITE_OTHER): Payer: Medicare Other | Admitting: Family Medicine

## 2018-07-18 VITALS — BP 132/74 | HR 66 | Temp 98.2°F | Resp 18 | Ht 75.0 in | Wt 264.0 lb

## 2018-07-18 DIAGNOSIS — J324 Chronic pansinusitis: Secondary | ICD-10-CM | POA: Diagnosis not present

## 2018-07-18 DIAGNOSIS — J209 Acute bronchitis, unspecified: Secondary | ICD-10-CM | POA: Diagnosis not present

## 2018-07-18 LAB — CBC WITH DIFFERENTIAL/PLATELET
Basophils Absolute: 53 cells/uL (ref 0–200)
Basophils Relative: 0.7 %
Eosinophils Absolute: 243 cells/uL (ref 15–500)
Eosinophils Relative: 3.2 %
HCT: 45.3 % (ref 38.5–50.0)
Hemoglobin: 15.2 g/dL (ref 13.2–17.1)
Lymphs Abs: 1132 cells/uL (ref 850–3900)
MCH: 29.7 pg (ref 27.0–33.0)
MCHC: 33.6 g/dL (ref 32.0–36.0)
MCV: 88.5 fL (ref 80.0–100.0)
MPV: 10 fL (ref 7.5–12.5)
Monocytes Relative: 8.4 %
Neutro Abs: 5533 cells/uL (ref 1500–7800)
Neutrophils Relative %: 72.8 %
Platelets: 194 10*3/uL (ref 140–400)
RBC: 5.12 10*6/uL (ref 4.20–5.80)
RDW: 13.5 % (ref 11.0–15.0)
Total Lymphocyte: 14.9 %
WBC mixed population: 638 cells/uL (ref 200–950)
WBC: 7.6 10*3/uL (ref 3.8–10.8)

## 2018-07-18 LAB — COMPREHENSIVE METABOLIC PANEL
AG Ratio: 1.4 (calc) (ref 1.0–2.5)
ALT: 15 U/L (ref 9–46)
AST: 20 U/L (ref 10–35)
Albumin: 3.8 g/dL (ref 3.6–5.1)
Alkaline phosphatase (APISO): 102 U/L (ref 40–115)
BUN: 14 mg/dL (ref 7–25)
CO2: 26 mmol/L (ref 20–32)
Calcium: 8.8 mg/dL (ref 8.6–10.3)
Chloride: 107 mmol/L (ref 98–110)
Creat: 0.95 mg/dL (ref 0.70–1.18)
Globulin: 2.8 g/dL (calc) (ref 1.9–3.7)
Glucose, Bld: 89 mg/dL (ref 65–99)
Potassium: 4.6 mmol/L (ref 3.5–5.3)
Sodium: 141 mmol/L (ref 135–146)
Total Bilirubin: 0.7 mg/dL (ref 0.2–1.2)
Total Protein: 6.6 g/dL (ref 6.1–8.1)

## 2018-07-18 LAB — LIPID PANEL
Cholesterol: 105 mg/dL (ref <200)
HDL: 37 mg/dL — ABNORMAL LOW (ref >40)
LDL Cholesterol (Calc): 49 mg/dL (calc)
Non-HDL Cholesterol (Calc): 68 mg/dL (calc) (ref <130)
Total CHOL/HDL Ratio: 2.8 (calc) (ref <5.0)
Triglycerides: 102 mg/dL (ref <150)

## 2018-07-18 LAB — PSA: PSA: 0.6 ng/mL (ref <=4.0)

## 2018-07-18 MED ORDER — BENZONATATE 100 MG PO CAPS: 100.0000 mg | ORAL_CAPSULE | Freq: Three times a day (TID) | ORAL | 0 refills | Status: DC | PRN

## 2018-07-18 MED ORDER — FLUTICASONE PROPIONATE 50 MCG/ACT NA SUSP: 2.0000 | Freq: Every day | NASAL | 2 refills | Status: DC

## 2018-07-18 MED ORDER — GUAIFENESIN ER 600 MG PO TB12: 600.0000 mg | ORAL_TABLET | Freq: Two times a day (BID) | ORAL | 0 refills | Status: AC

## 2018-07-18 MED ORDER — PREDNISONE 20 MG PO TABS: 40.0000 mg | ORAL_TABLET | Freq: Every day | ORAL | 0 refills | Status: AC

## 2018-07-18 MED ORDER — DOXYCYCLINE HYCLATE 100 MG PO TABS: 100.0000 mg | ORAL_TABLET | Freq: Two times a day (BID) | ORAL | 0 refills | Status: AC

## 2018-07-18 NOTE — Patient Instructions (Addendum)
Get back on your allergy medications and take daily for the next 2 to 3 weeks.  Would do a steroid nasal spray or Nasonex, Flonase or generic equivalent, 2 sprays in each nostril daily.  I would do this after showers and blowing her nose you can clear out as much mucus and discharge as you can get the medicines in  I would also take a daily antihistamine to block any allergies that are creating this fluid, this would be 10 mg Zyrtec, Claritin or Allegra  Because he had this for several weeks will start a second antibiotic course, take doxycycline as prescribed  For your chest symptoms I would start taking Mucinex and take the steroids as prescribed and can use cough medicine provided or other over-the-counter cough medicines like Robitussin or Delsym   Please call us or follow-up sooner if you have any worsening of your symptoms, any new fevers, any chest pain or shortness of breath.  Currently do not feel like you need a chest x-ray as it is likely at upper aspiratory infection or early bronchitis and your lungs are clear and x-ray imaging will not show anything that will change management and generally does not show bronchitis.  If you do not improve in several weeks I would like to reexamine your lungs and determine need for x-ray at that time.

## 2018-07-18 NOTE — Progress Notes (Signed)
Patient ID: Gregory Suarez, male    DOB: [birth year], 72 y.o.   MRN: [id number]  PCP: [name], MD  Chief Complaint  Patient presents with  . Cough    x months- [facility] [place] and given augmentin and nasal spray- no relief    Subjective:   Gregory Suarez is a 72 y.o. male, presents to clinic with CC of 4 weeks of sinus congestion and discharge with associated worsening of chest congestion and intermittently productive cough.  3 weeks ago he was seen for symptoms and was given steroid nasal spray, antihistamine and a course of Augmentin which he completed with some improvement, however shortly after completing the antibiotics, he also stopped taking the allergy medications and nasal spray, shortly afterwards he did fly and his symptoms acutely worsened with more chest congestion, continued nasal discharge sinus congestion and sensation that his ears are blocked and popping.  He states that he feels generally ill and slightly run down but he denies any fever, sweats, headache, nausea or vomiting, chest pain, shortness of breath or wheeze.   He is a former smoker but quit over 25 years ago.  He not have a recent history of getting bronchitis and he has never needed inhalers.   Patient Active Problem List   Diagnosis Date Noted  . Tubular adenoma of colon   . RLS (restless legs syndrome)   . Abnormal CT of the chest 02/27/2012  . Chronic cough 02/27/2012  . Hypertension 02/26/2012  . Hyperlipidemia 02/26/2012  . GERD (gastroesophageal reflux disease)   . CAD (coronary artery disease)   . Erectile dysfunction   . Mitral valve regurgitation      Prior to Admission medications   Medication Sig Start Date End Date Taking? Authorizing Provider  aspirin 325 MG tablet Take 325 mg by mouth daily.   Yes [provider]  atorvastatin (LIPITOR) 40 MG tablet TAKE 1 TABLET BY MOUTH EVERY DAY 05/14/18  Yes [name], MD  chlorpheniramine (ALLERGY) 4 MG tablet  Take 4 mg by mouth daily as needed.   Yes [provider]  finasteride (PROSCAR) 5 MG tablet Take 1 tablet (5 mg total) by mouth daily. 12/09/14  Yes [name], MD  hydrOXYzine (ATARAX/VISTARIL) 25 MG tablet Take 25 mg by mouth at bedtime as needed.    Yes [provider]  metoprolol tartrate (LOPRESSOR) 25 MG tablet TAKE 1 TABLET BY MOUTH TWICE A DAY 04/24/18  Yes [name], MD  nitroGLYCERIN (NITROSTAT) 0.4 MG SL tablet Place 1 tablet (0.4 mg total) under the tongue every 5 (five) minutes as needed. 02/09/16  Yes [name], MD  pantoprazole (PROTONIX) 40 MG tablet Take 1 tablet (40 mg total) by mouth 2 (two) times daily. 12/18/17  Yes [name], MD  pramipexole (MIRAPEX) 0.25 MG tablet TAKE 2 TABLETS BY MOUTH EVERY DAY AT BEDTIME 10/09/17  Yes [name], MD  Probiotic Product (PROBIOTIC FORMULA PO) Take 1 tablet by mouth daily.   Yes [provider]  tadalafil (CIALIS) 5 MG tablet Take 1 tablet (5 mg total) by mouth daily. 09/13/14  Yes [name], MD  tiZANidine (ZANAFLEX) 4 MG tablet Take 1 tablet (4 mg total) by mouth every 8 (eight) hours as needed for muscle spasms. 03/11/15  Yes [place], Modena Nunnery, MD  valACYclovir (VALTREX) 1000 MG tablet Take 1 tablet (1,000 mg total) by mouth daily. 03/11/15  Yes [place], [place] F,  MD  benzonatate (TESSALON) 100 MG capsule Take 1 capsule (100 mg total) by mouth 3 (three) times daily as needed for cough. 07/18/18   Delsa Grana, PA-C  doxycycline (VIBRA-TABS) 100 MG tablet Take 1 tablet (100 mg total) by mouth 2 (two) times daily for 7 days. 07/18/18 07/25/18  Delsa Grana, PA-C  fluticasone (FLONASE) 50 MCG/ACT nasal spray Place 2 sprays into both nostrils daily. 07/18/18   Delsa Grana, PA-C  guaiFENesin (MUCINEX) 600 MG 12 hr tablet Take 1 tablet (600 mg total) by mouth 2 (two) times daily for 7 days. 07/18/18 07/25/18  Delsa Grana, PA-C  predniSONE (DELTASONE) 20 MG tablet Take 2 tablets  (40 mg total) by mouth daily with breakfast for 5 days. 07/18/18 07/23/18  Delsa Grana, PA-C     No Known Allergies   Family History  Problem Relation Age of Onset  . Diabetes Unknown   . Breast cancer Unknown   . Coronary artery disease Unknown      Social History   Socioeconomic History  . Marital status: Married    Spouse name: Not on file  . Number of children: Not on file  . Years of education: Not on file  . Highest education level: Not on file  Occupational History  . Occupation: RETIRED  Social Needs  . Financial resource strain: Not on file  . Food insecurity:    Worry: Not on file    Inability: Not on file  . Transportation needs:    Medical: Not on file    Non-medical: Not on file  Tobacco Use  . Smoking status: Former Smoker    Packs/day: 0.50    Years: 15.00    Pack years: 7.50    Types: Cigarettes    Last attempt to quit: 10/01/1988    Years since quitting: 29.8  . Smokeless tobacco: Never Used  Substance and Sexual Activity  . Alcohol use: No    Alcohol/week: 0.0 standard drinks  . Drug use: No  . Sexual activity: Yes    Comment: married, 2 grown sons, retired maintenance man  Lifestyle  . Physical activity:    Days per week: Not on file    Minutes per session: Not on file  . Stress: Not on file  Relationships  . Social connections:    Talks on phone: Not on file    Gets together: Not on file    Attends religious service: Not on file    Active member of club or organization: Not on file    Attends meetings of clubs or organizations: Not on file    Relationship status: Not on file  . Intimate partner violence:    Fear of current or ex partner: Not on file    Emotionally abused: Not on file    Physically abused: Not on file    Forced sexual activity: Not on file  Other Topics Concern  . Not on file  Social History Narrative  . Not on file     Review of Systems  Constitutional: Negative.  Negative for appetite change, chills,  diaphoresis, fever and unexpected weight change.  HENT: Positive for congestion, postnasal drip, rhinorrhea, sinus pressure and sinus pain. Negative for ear discharge, facial swelling, hearing loss, mouth sores, nosebleeds, sneezing, sore throat, tinnitus, trouble swallowing and voice change.   Eyes: Negative.   Respiratory: Positive for cough. Negative for apnea, choking, chest tightness, shortness of breath, wheezing and stridor.   Cardiovascular: Negative.  Negative for chest pain, palpitations and  leg swelling.  Gastrointestinal: Negative.  Negative for abdominal pain, diarrhea, nausea and vomiting.  Endocrine: Negative.   Genitourinary: Negative.   Musculoskeletal: Negative.  Negative for back pain.  Skin: Negative.  Negative for color change, pallor, rash and wound.  Allergic/Immunologic: Negative.   Neurological: Negative.  Negative for dizziness, syncope, weakness and light-headedness.  Hematological: Negative.   Psychiatric/Behavioral: Negative.        Objective:    Vitals:   07/18/18 1054  BP: 132/74  Pulse: 66  Resp: 18  Temp: 98.2 F (36.8 C)  TempSrc: Oral  SpO2: 96%  Weight: 264 lb (119.7 kg)  Height: 6\' 3"  (1.905 m)      Physical Exam  Constitutional: He appears well-developed and well-nourished. No distress.  Well-appearing elderly male, appears stated age, nontoxic, NAD  HENT:  Head: Normocephalic and atraumatic.  Right external ear, right external auditory canal and right tympanic membrane normal in appearance Left external ear normal in appearance, brown cerumen obstructing left canal roughly 80% with small portion of tympanic membrane visible and normal-appearing Severe nasal discharge mucoid clear to yellow/green, with nasal mucosa erythematous and edematous and bilateral nares not patent Mild frontal and maxillary sinus tenderness to palpation bilaterally Oropharynx diffusely injected and erythematous without exudate or edema, uvula midline, tonsils  not visualized, mucous membranes moist  Eyes: Pupils are equal, round, and reactive to light. Conjunctivae and EOM are normal. Right eye exhibits no discharge. Left eye exhibits no discharge. No scleral icterus.  Neck: Normal range of motion. Neck supple. No tracheal deviation present.  Cardiovascular: Normal rate, regular rhythm, normal heart sounds and intact distal pulses. Exam reveals no gallop and no friction rub.  No murmur heard. 2+ bilateral pulses radial and posterior tibialis No lower extremity edema bilaterally  Pulmonary/Chest: Effort normal and breath sounds normal. No stridor. No respiratory distress. He has no wheezes. He has no rales. He exhibits no tenderness.  Intermittent wet sounding cough without any respiratory distress, retractions, accessory muscle use or tachypnea   Abdominal: Soft. Bowel sounds are normal. He exhibits no distension and no mass. There is no tenderness. There is no guarding.  Musculoskeletal: Normal range of motion.  Lymphadenopathy:    He has no cervical adenopathy.  Neurological: He is alert. He exhibits normal muscle tone. Coordination normal.  Skin: Skin is warm and dry. No rash noted. He is not diaphoretic.  Psychiatric: He has a normal mood and affect. His behavior is normal.  Nursing note and vitals reviewed.         Assessment & Plan:      ICD-10-CM   1. Pansinusitis, unspecified chronicity J32.4 fluticasone (FLONASE) 50 MCG/ACT nasal spray    doxycycline (VIBRA-TABS) 100 MG tablet  2. Acute bronchitis, unspecified organism J20.9 predniSONE (DELTASONE) 20 MG tablet    benzonatate (TESSALON) 100 MG capsule    guaiFENesin (MUCINEX) 600 MG 12 hr tablet    More than 4 weeks of nasal congestion with pain and pressure, started Nasonex and allergy pill for about [redacted] week along with antibiotics and he had some mild improvement but then he stopped everything and worsened over the last 2 weeks with acute worsening of his ears and nasal congestion  when flying recently.  Over the last 2 weeks he is also developed worsening chest congestion and productive cough but he does not have any shortness of breath, wheeze or pain in his chest, do suspect early bronchitis or upper respiratory infection however I will treat him for acute bacterial  sinusitis with his exam today and length of symptoms with failed Augmentin.  Traveling, flying and intermittent use of allergy medications may have also contributed towards his worsening so he was advised to start the steroid nasal spray and antihistamine again daily for the next 2 to 3 weeks.  Cover with doxycycline since he has already tried Augmentin.  For coughing he was given Tessalon Perles, prednisone burst, advised to use Mucinex over-the-counter along with treatment of his sinuses and possible allergies.  Lungs are clear and he has no history of lung disease, not feel he currently needs x-ray imaging because I will already cover with doxycycline and steroids.  Encouraged to follow-up in the next 2 weeks if not feeling much better.  He replies to that that he does have a well visit with his PCP, Dr. [name] next week.  Delsa Grana, PA-C 07/18/18 11:45 AM

## 2018-07-24 ENCOUNTER — Encounter: Payer: Self-pay | Admitting: Family Medicine

## 2018-07-24 ENCOUNTER — Ambulatory Visit (INDEPENDENT_AMBULATORY_CARE_PROVIDER_SITE_OTHER): Payer: Medicare Other | Admitting: Family Medicine

## 2018-07-24 VITALS — BP 110/64 | HR 60 | Temp 97.8°F | Resp 18 | Ht 75.0 in | Wt 269.0 lb

## 2018-07-24 DIAGNOSIS — I10 Essential (primary) hypertension: Secondary | ICD-10-CM | POA: Diagnosis not present

## 2018-07-24 DIAGNOSIS — E785 Hyperlipidemia, unspecified: Secondary | ICD-10-CM

## 2018-07-24 DIAGNOSIS — Z23 Encounter for immunization: Secondary | ICD-10-CM

## 2018-07-24 DIAGNOSIS — Z0000 Encounter for general adult medical examination without abnormal findings: Secondary | ICD-10-CM

## 2018-07-24 DIAGNOSIS — I2510 Atherosclerotic heart disease of native coronary artery without angina pectoris: Secondary | ICD-10-CM | POA: Diagnosis not present

## 2018-07-24 DIAGNOSIS — Z125 Encounter for screening for malignant neoplasm of prostate: Secondary | ICD-10-CM

## 2018-07-24 NOTE — Progress Notes (Signed)
Subjective:    Patient ID: Gregory Suarez, male    DOB: December 24, 1945, 72 y.o.   MRN: [id number]  HPI Patient is here today for complete physical exam.  Since I last saw the patient, he is under the care of a psychiatrist through the [facility] for PTSD.  His last colonoscopy was performed in 2018 and was significant for polyps.  His gastroenterologist recommended a repeat colonoscopy in 3 to 5 years.  He is no longer seeing urology however we checked his PSA on his lab work and was found to be excellent at 0.6.  He is due for hepatitis C but he declines this at the present time.  He is due for his flu shot and he received that today.  The remainder of his medical screening is up-to-date.  His recent lab work is listed below Immunization History  Administered Date(s) Administered  . Influenza, High Dose Seasonal PF 07/24/2018  . Pneumococcal Conjugate-13 01/16/2014  . Pneumococcal Polysaccharide-23 02/04/2015  . Td 03/31/2009  . Tdap 05/24/2009  . Zoster 07/06/2011    Lab on 07/17/2018  Component Date Value Ref Range Status  . PSA 07/17/2018 0.6  < OR = 4.0 ng/mL Final   Comment: The total PSA value from this assay system is  standardized against the WHO standard. The test  result will be approximately 20% lower when compared  to the equimolar-standardized total PSA (Beckman  Coulter). Comparison of serial PSA results should be  interpreted with this fact in mind. . This test was performed using the Siemens  chemiluminescent method. Values obtained from  different assay methods cannot be used interchangeably. PSA levels, regardless of value, should not be interpreted as absolute evidence of the presence or absence of disease.   . WBC 07/17/2018 7.6  3.8 - 10.8 Thousand/uL Final  . RBC 07/17/2018 5.12  4.20 - 5.80 Million/uL Final  . Hemoglobin 07/17/2018 15.2  13.2 - 17.1 g/dL Final  . HCT 07/17/2018 45.3  38.5 - 50.0 % Final  . MCV 07/17/2018 88.5  80.0 - 100.0 fL  Final  . MCH 07/17/2018 29.7  27.0 - 33.0 pg Final  . MCHC 07/17/2018 33.6  32.0 - 36.0 g/dL Final  . RDW 07/17/2018 13.5  11.0 - 15.0 % Final  . Platelets 07/17/2018 194  140 - 400 Thousand/uL Final  . MPV 07/17/2018 10.0  7.5 - 12.5 fL Final  . Neutro Abs 07/17/2018 5,533  1,500 - 7,800 cells/uL Final  . Lymphs Abs 07/17/2018 1,132  850 - 3,900 cells/uL Final  . WBC mixed population 07/17/2018 638  200 - 950 cells/uL Final  . Eosinophils Absolute 07/17/2018 243  15 - 500 cells/uL Final  . Basophils Absolute 07/17/2018 53  0 - 200 cells/uL Final  . Neutrophils Relative % 07/17/2018 72.8  % Final  . Total Lymphocyte 07/17/2018 14.9  % Final  . Monocytes Relative 07/17/2018 8.4  % Final  . Eosinophils Relative 07/17/2018 3.2  % Final  . Basophils Relative 07/17/2018 0.7  % Final  . Cholesterol 07/17/2018 105  <200 mg/dL Final  . HDL 07/17/2018 37* >40 mg/dL Final  . Triglycerides 07/17/2018 102  <150 mg/dL Final  . LDL Cholesterol (Calc) 07/17/2018 49  mg/dL (calc) Final   Comment: Reference range: <100 . Desirable range <100 mg/dL for primary prevention;   <70 mg/dL for patients with CHD or diabetic patients  with > or = 2 CHD risk factors. Marland Kitchen LDL-C is now calculated using the Martin-Hopkins  calculation,  which is a validated novel method providing  better accuracy than the Friedewald equation in the  estimation of LDL-C.  Cresenciano Genre et al. Annamaria Helling. 4034;742(59): 2061-2068  (http://education.[facility].com/faq/FAQ164)   . Total CHOL/HDL Ratio 07/17/2018 2.8  <5.0 (calc) Final  . Non-HDL Cholesterol (Calc) 07/17/2018 68  <130 mg/dL (calc) Final   Comment: For patients with diabetes plus 1 major ASCVD risk  factor, treating to a non-HDL-C goal of <100 mg/dL  (LDL-C of <70 mg/dL) is considered a therapeutic  option.   . Glucose, Bld 07/17/2018 89  65 - 99 mg/dL Final   Comment: .            Fasting reference interval .   . BUN 07/17/2018 14  7 - 25 mg/dL Final  . Creat  07/17/2018 0.95  0.70 - 1.18 mg/dL Final   Comment: For patients >89 years of age, the reference limit for Creatinine is approximately 13% higher for people identified as African-American. .   Havery Moros Ratio 56/38/7564 NOT APPLICABLE  6 - 22 (calc) Final  . Sodium 07/17/2018 141  135 - 146 mmol/L Final  . Potassium 07/17/2018 4.6  3.5 - 5.3 mmol/L Final  . Chloride 07/17/2018 107  98 - 110 mmol/L Final  . CO2 07/17/2018 26  20 - 32 mmol/L Final  . Calcium 07/17/2018 8.8  8.6 - 10.3 mg/dL Final  . Total Protein 07/17/2018 6.6  6.1 - 8.1 g/dL Final  . Albumin 07/17/2018 3.8  3.6 - 5.1 g/dL Final  . Globulin 07/17/2018 2.8  1.9 - 3.7 g/dL (calc) Final  . AG Ratio 07/17/2018 1.4  1.0 - 2.5 (calc) Final  . Total Bilirubin 07/17/2018 0.7  0.2 - 1.2 mg/dL Final  . Alkaline phosphatase (APISO) 07/17/2018 102  40 - 115 U/L Final  . AST 07/17/2018 20  10 - 35 U/L Final  . ALT 07/17/2018 15  9 - 46 U/L Final    Past Medical History:  Diagnosis Date  . CAD (coronary artery disease)   . Elevated PSA   . Erectile dysfunction   . GERD (gastroesophageal reflux disease)   . Hyperlipidemia   . Hypertension   . Mitral valve regurgitation   . OSA treated with BiPAP   . RLS (restless legs syndrome)   . Tubular adenoma of colon    Past Surgical History:  Procedure Laterality Date  . ANKLE SURGERY    . CHOLECYSTECTOMY    . CHOLECYSTECTOMY     Current Outpatient Medications on File Prior to Visit  Medication Sig Dispense Refill  . aspirin 325 MG tablet Take 325 mg by mouth daily.    Marland Kitchen atorvastatin (LIPITOR) 40 MG tablet TAKE 1 TABLET BY MOUTH EVERY DAY 90 tablet 0  . busPIRone (BUSPAR) 7.5 MG tablet Take 7.5 mg by mouth 3 (three) times daily.    . chlorpheniramine (ALLERGY) 4 MG tablet Take 4 mg by mouth daily as needed.    . doxycycline (VIBRA-TABS) 100 MG tablet Take 1 tablet (100 mg total) by mouth 2 (two) times daily for 7 days. 14 tablet 0  . finasteride (PROSCAR) 5 MG tablet  Take 1 tablet (5 mg total) by mouth daily. 90 tablet 3  . fluticasone (FLONASE) 50 MCG/ACT nasal spray Place 2 sprays into both nostrils daily. 16 g 2  . guaiFENesin (MUCINEX) 600 MG 12 hr tablet Take 1 tablet (600 mg total) by mouth 2 (two) times daily for 7 days. 14 tablet 0  . hydrOXYzine (ATARAX/VISTARIL) 25 MG  tablet Take 25 mg by mouth at bedtime as needed.     . metoprolol tartrate (LOPRESSOR) 25 MG tablet TAKE 1 TABLET BY MOUTH TWICE A DAY 180 tablet 2  . nitroGLYCERIN (NITROSTAT) 0.4 MG SL tablet Place 1 tablet (0.4 mg total) under the tongue every 5 (five) minutes as needed. 30 tablet 1  . pantoprazole (PROTONIX) 40 MG tablet Take 1 tablet (40 mg total) by mouth 2 (two) times daily. 180 tablet 3  . pramipexole (MIRAPEX) 0.25 MG tablet TAKE 2 TABLETS BY MOUTH EVERY DAY AT BEDTIME 180 tablet 3  . Probiotic Product (PROBIOTIC FORMULA PO) Take 1 tablet by mouth daily.    . QUEtiapine (SEROQUEL) 25 MG tablet Take 25 mg by mouth at bedtime.    . tadalafil (CIALIS) 5 MG tablet Take 1 tablet (5 mg total) by mouth daily. 30 tablet 4  . tiZANidine (ZANAFLEX) 4 MG tablet Take 1 tablet (4 mg total) by mouth every 8 (eight) hours as needed for muscle spasms. 30 tablet 1  . valACYclovir (VALTREX) 1000 MG tablet Take 1 tablet (1,000 mg total) by mouth daily. 30 tablet 1   No current facility-administered medications on file prior to visit.    No Known Allergies Social History   Socioeconomic History  . Marital status: Married    Spouse name: Not on file  . Number of children: Not on file  . Years of education: Not on file  . Highest education level: Not on file  Occupational History  . Occupation: RETIRED  Social Needs  . Financial resource strain: Not on file  . Food insecurity:    Worry: Not on file    Inability: Not on file  . Transportation needs:    Medical: Not on file    Non-medical: Not on file  Tobacco Use  . Smoking status: Former Smoker    Packs/day: 0.50    Years: 15.00      Pack years: 7.50    Types: Cigarettes    Last attempt to quit: 10/01/1988    Years since quitting: 29.8  . Smokeless tobacco: Never Used  Substance and Sexual Activity  . Alcohol use: No    Alcohol/week: 0.0 standard drinks  . Drug use: No  . Sexual activity: Yes    Comment: married, 2 grown sons, retired maintenance man  Lifestyle  . Physical activity:    Days per week: Not on file    Minutes per session: Not on file  . Stress: Not on file  Relationships  . Social connections:    Talks on phone: Not on file    Gets together: Not on file    Attends religious service: Not on file    Active member of club or organization: Not on file    Attends meetings of clubs or organizations: Not on file    Relationship status: Not on file  . Intimate partner violence:    Fear of current or ex partner: Not on file    Emotionally abused: Not on file    Physically abused: Not on file    Forced sexual activity: Not on file  Other Topics Concern  . Not on file  Social History Narrative  . Not on file   Family History  Problem Relation Age of Onset  . Diabetes Unknown   . Breast cancer Unknown   . Coronary artery disease Unknown       Review of Systems  All other systems reviewed and are negative.  Objective:   Physical Exam  Constitutional: He is oriented to person, place, and time. He appears well-developed and well-nourished. No distress.  HENT:  Head: Normocephalic and atraumatic.  Right Ear: External ear normal.  Left Ear: External ear normal.  Nose: Nose normal.  Mouth/Throat: Oropharynx is clear and moist. No oropharyngeal exudate.  Eyes: Pupils are equal, round, and reactive to light. Conjunctivae and EOM are normal. Right eye exhibits no discharge. Left eye exhibits no discharge. No scleral icterus.  Neck: Normal range of motion. Neck supple. No JVD present. No tracheal deviation present. No thyromegaly present.  Cardiovascular: Normal rate, regular rhythm,  normal heart sounds and intact distal pulses. Exam reveals no gallop and no friction rub.  No murmur heard. Pulmonary/Chest: Effort normal and breath sounds normal. No stridor. No respiratory distress. He has no wheezes. He has no rales. He exhibits no tenderness.  Abdominal: Soft. Bowel sounds are normal. He exhibits no distension and no mass. There is no tenderness. There is no rebound and no guarding.  Musculoskeletal: Normal range of motion. He exhibits no edema or tenderness.  Lymphadenopathy:    He has no cervical adenopathy.  Neurological: He is alert and oriented to person, place, and time. He has normal reflexes. No cranial nerve deficit. He exhibits normal muscle tone. Coordination normal.  Skin: Skin is warm. No rash noted. He is not diaphoretic. No erythema. No pallor.  Psychiatric: He has a normal mood and affect. His behavior is normal. Judgment and thought content normal.  Vitals reviewed.         Assessment & Plan:  Needs flu shot - Plan: Flu vaccine HIGH DOSE PF  Routine general medical examination at a health care facility  ASCVD (arteriosclerotic cardiovascular disease)  Essential hypertension  Screening PSA (prostate specific antigen)  Hyperlipidemia, unspecified hyperlipidemia type  Physical exam today is normal.  I have deferred the management of his PTSD to a psychiatrist.  He received his flu shot today.  Colonoscopy is up-to-date.  Prostate cancer screening is up-to-date with PSA.  Blood pressure is excellent.  Lab work is significant only for mild dyslipidemia with a low HDL.  I recommended increasing aerobic exercise to help manage this.  He states that his restless leg syndrome is adequately managed.

## 2018-08-06 DIAGNOSIS — Z1283 Encounter for screening for malignant neoplasm of skin: Secondary | ICD-10-CM | POA: Diagnosis not present

## 2018-08-06 DIAGNOSIS — L578 Other skin changes due to chronic exposure to nonionizing radiation: Secondary | ICD-10-CM | POA: Diagnosis not present

## 2018-08-06 DIAGNOSIS — L570 Actinic keratosis: Secondary | ICD-10-CM | POA: Diagnosis not present

## 2018-08-06 DIAGNOSIS — Z85828 Personal history of other malignant neoplasm of skin: Secondary | ICD-10-CM | POA: Diagnosis not present

## 2018-08-06 DIAGNOSIS — L738 Other specified follicular disorders: Secondary | ICD-10-CM | POA: Diagnosis not present

## 2018-08-06 DIAGNOSIS — L820 Inflamed seborrheic keratosis: Secondary | ICD-10-CM | POA: Diagnosis not present

## 2018-08-07 ENCOUNTER — Other Ambulatory Visit: Payer: Self-pay | Admitting: Family Medicine

## 2018-08-07 DIAGNOSIS — Z0000 Encounter for general adult medical examination without abnormal findings: Secondary | ICD-10-CM

## 2018-08-13 ENCOUNTER — Other Ambulatory Visit: Payer: Self-pay | Admitting: Family Medicine

## 2018-08-13 MED ORDER — TADALAFIL 5 MG PO TABS: 5.0000 mg | ORAL_TABLET | Freq: Every day | ORAL | 4 refills | Status: DC

## 2018-09-15 DIAGNOSIS — L820 Inflamed seborrheic keratosis: Secondary | ICD-10-CM | POA: Diagnosis not present

## 2018-10-14 ENCOUNTER — Other Ambulatory Visit: Payer: Self-pay | Admitting: Family Medicine

## 2018-10-30 ENCOUNTER — Ambulatory Visit
Admission: RE | Admit: 2018-10-30 | Discharge: 2018-10-30 | Disposition: A | Discharge status: Home / Self Care | Payer: Medicare Other | Source: Ambulatory Visit | Attending: Family Medicine | Admitting: Family Medicine

## 2018-10-30 ENCOUNTER — Ambulatory Visit (INDEPENDENT_AMBULATORY_CARE_PROVIDER_SITE_OTHER): Payer: Medicare Other | Admitting: Family Medicine

## 2018-10-30 ENCOUNTER — Encounter: Payer: Self-pay | Admitting: Family Medicine

## 2018-10-30 ENCOUNTER — Telehealth: Payer: Self-pay | Admitting: Family Medicine

## 2018-10-30 VITALS — BP 110/70 | HR 56 | Temp 98.6°F | Resp 16 | Ht 75.0 in | Wt 264.2 lb

## 2018-10-30 DIAGNOSIS — R05 Cough: Secondary | ICD-10-CM | POA: Diagnosis not present

## 2018-10-30 DIAGNOSIS — R911 Solitary pulmonary nodule: Secondary | ICD-10-CM

## 2018-10-30 DIAGNOSIS — R197 Diarrhea, unspecified: Secondary | ICD-10-CM | POA: Diagnosis not present

## 2018-10-30 DIAGNOSIS — R059 Cough, unspecified: Secondary | ICD-10-CM

## 2018-10-30 DIAGNOSIS — J449 Chronic obstructive pulmonary disease, unspecified: Secondary | ICD-10-CM | POA: Diagnosis not present

## 2018-10-30 MED ORDER — ALBUTEROL SULFATE (2.5 MG/3ML) 0.083% IN NEBU: 2.5000 mg | INHALATION_SOLUTION | Freq: Four times a day (QID) | RESPIRATORY_TRACT | 1 refills | Status: DC | PRN

## 2018-10-30 MED ORDER — AZITHROMYCIN 250 MG PO TABS: ORAL_TABLET | ORAL | 0 refills | Status: DC

## 2018-10-30 MED ORDER — IPRATROPIUM-ALBUTEROL 0.5-2.5 (3) MG/3ML IN SOLN
3.0000 mL | Freq: Once | RESPIRATORY_TRACT | Status: AC
  Administered 2018-10-30: 3 mL via RESPIRATORY_TRACT

## 2018-10-30 MED ORDER — BENZONATATE 100 MG PO CAPS: 100.0000 mg | ORAL_CAPSULE | Freq: Three times a day (TID) | ORAL | 0 refills | Status: DC | PRN

## 2018-10-30 MED ORDER — PREDNISONE 20 MG PO TABS: 40.0000 mg | ORAL_TABLET | Freq: Every day | ORAL | 0 refills | Status: AC

## 2018-10-30 NOTE — Progress Notes (Signed)
Patient ID: Gregory Suarez, male    DOB: 1946/08/23, 73 y.o.   MRN: [id number]  PCP: [name], MD  Chief Complaint  Patient presents with  . Hoarse    Patient in with c/o voice hoarseness, cough. Onset a few weeks ago  . Diarrhea    Onset of Diarrhea 3 weeks ago.     Subjective:   Gregory Suarez is a 73 y.o. male, presents to clinic with CC of diarrhea x 3 weeks and productive cough and scratchy throat x2 weeks  Little weight loss over the past couple months - says he lost weight when diarrhea started and was initially more severe, more frequent with less appetite, but all that is improving and yesterday and today stool ist starting to be more formed only 1-3 x/day. Wt Readings from Last 5 Encounters:  10/30/18 264 lb 4 oz (119.9 kg)  07/24/18 269 lb (122 kg)  07/18/18 264 lb (119.7 kg)  02/11/17 228 lb (103.4 kg)  07/10/16 208 lb 9.6 oz (94.6 kg)    Cough  Episode onset: 3 months ago and recurrent 2 weeks ago, but never really went away. The problem has been gradually worsening. The problem occurs every few hours. The cough is non-productive. Associated symptoms include postnasal drip and a sore throat. Pertinent negatives include no chest pain, chills, ear congestion, ear pain, fever, headaches, heartburn, hemoptysis, myalgias, nasal congestion, rash, rhinorrhea, shortness of breath, sweats, weight loss or wheezing. The symptoms are aggravated by lying down. The treatment provided no relief. His past medical history is significant for COPD ("his PCP says some COPD") and pneumonia (decades ago). There is no history of asthma.  Diarrhea   This is a new problem. The current episode started 1 to 4 weeks ago. The problem occurs less than 2 times per day. The problem has been gradually improving. The stool consistency is described as watery. The patient states that diarrhea does not awaken him from sleep. Associated symptoms include coughing and a URI. Pertinent negatives include no  abdominal pain, arthralgias, bloating, chills, fever, headaches, increased  flatus, myalgias, sweats, vomiting or weight loss. Exacerbated by: pasta? He has tried anti-motility drug and bismuth subsalicylate for the symptoms. The treatment provided mild relief. There is no history of bowel resection, inflammatory bowel disease, irritable bowel syndrome, malabsorption, a recent abdominal surgery or short gut syndrome.   HE denies melena, hematochezia, urinary sx, back pain, flank pain.  Urine output is good.   Patient Active Problem List   Diagnosis Date Noted  . Tubular adenoma of colon   . RLS (restless legs syndrome)   . Abnormal CT of the chest 02/27/2012  . Chronic cough 02/27/2012  . Hypertension 02/26/2012  . Hyperlipidemia 02/26/2012  . GERD (gastroesophageal reflux disease)   . CAD (coronary artery disease)   . Erectile dysfunction   . Mitral valve regurgitation      Prior to Admission medications   Medication Sig Start Date End Date Taking? Authorizing Provider  aspirin 325 MG tablet Take 325 mg by mouth daily.   Yes [provider]  atorvastatin (LIPITOR) 40 MG tablet Take 1 tablet (40 mg total) by mouth daily. 08/07/18  Yes [name], MD  chlorpheniramine (ALLERGY) 4 MG tablet Take 4 mg by mouth daily as needed.   Yes [provider]  finasteride (PROSCAR) 5 MG tablet Take 1 tablet (5 mg total) by mouth daily. 12/09/14  Yes [name], MD  fluticasone (FLONASE) 50 MCG/ACT  nasal spray Place 2 sprays into both nostrils daily. 07/18/18  Yes Delsa Grana, PA-C  metoprolol tartrate (LOPRESSOR) 25 MG tablet TAKE 1 TABLET BY MOUTH TWICE A DAY 04/24/18  Yes [name], MD  pantoprazole (PROTONIX) 40 MG tablet Take 1 tablet (40 mg total) by mouth 2 (two) times daily. 12/18/17  Yes [name], MD  pramipexole (MIRAPEX) 0.25 MG tablet TAKE 2 TABLETS BY MOUTH EVERY DAY AT BEDTIME 10/14/18  Yes [name], MD  Probiotic Product (PROBIOTIC  FORMULA PO) Take 1 tablet by mouth daily.   Yes [provider]  tadalafil (CIALIS) 5 MG tablet Take 1 tablet (5 mg total) by mouth daily. 08/13/18  Yes [name], MD  tiZANidine (ZANAFLEX) 4 MG tablet Take 1 tablet (4 mg total) by mouth every 8 (eight) hours as needed for muscle spasms. 03/11/15  Yes Dutton, Modena Nunnery, MD  nitroGLYCERIN (NITROSTAT) 0.4 MG SL tablet Place 1 tablet (0.4 mg total) under the tongue every 5 (five) minutes as needed. Patient not taking: Reported on 10/30/2018 02/09/16   [name], MD  valACYclovir (VALTREX) 1000 MG tablet Take 1 tablet (1,000 mg total) by mouth daily. Patient not taking: Reported on 10/30/2018 03/11/15   Alycia Rossetti, MD     No Known Allergies   Family History  Problem Relation Age of Onset  . Diabetes Unknown   . Breast cancer Unknown   . Coronary artery disease Unknown      Social History   Socioeconomic History  . Marital status: Married    Spouse name: Not on file  . Number of children: Not on file  . Years of education: Not on file  . Highest education level: Not on file  Occupational History  . Occupation: RETIRED  Social Needs  . Financial resource strain: Not on file  . Food insecurity:    Worry: Not on file    Inability: Not on file  . Transportation needs:    Medical: Not on file    Non-medical: Not on file  Tobacco Use  . Smoking status: Former Smoker    Packs/day: 0.50    Years: 15.00    Pack years: 7.50    Types: Cigarettes    Last attempt to quit: 10/01/1988    Years since quitting: 30.0  . Smokeless tobacco: Never Used  Substance and Sexual Activity  . Alcohol use: No    Alcohol/week: 0.0 standard drinks  . Drug use: No  . Sexual activity: Yes    Comment: married, 2 grown sons, retired maintenance man  Lifestyle  . Physical activity:    Days per week: Not on file    Minutes per session: Not on file  . Stress: Not on file  Relationships  . Social connections:    Talks on  phone: Not on file    Gets together: Not on file    Attends religious service: Not on file    Active member of club or organization: Not on file    Attends meetings of clubs or organizations: Not on file    Relationship status: Not on file  . Intimate partner violence:    Fear of current or ex partner: Not on file    Emotionally abused: Not on file    Physically abused: Not on file    Forced sexual activity: Not on file  Other Topics Concern  . Not on file  Social History Narrative  . Not on file  Review of Systems  Constitutional: Negative.  Negative for activity change, chills, fatigue, fever, unexpected weight change and weight loss.  HENT: Positive for postnasal drip, sore throat and voice change (raspy/scratchy). Negative for ear pain and rhinorrhea.   Eyes: Negative.   Respiratory: Positive for cough. Negative for hemoptysis, shortness of breath and wheezing.   Cardiovascular: Negative.  Negative for chest pain, palpitations and leg swelling.  Gastrointestinal: Positive for diarrhea. Negative for abdominal distention, abdominal pain, anal bleeding, bloating, blood in stool, flatus, heartburn, nausea, rectal pain and vomiting.  Endocrine: Negative.   Genitourinary: Negative.   Musculoskeletal: Negative.  Negative for arthralgias and myalgias.  Skin: Negative.  Negative for rash.  Allergic/Immunologic: Negative.   Neurological: Negative.  Negative for syncope, weakness, light-headedness and headaches.  Hematological: Negative.   Psychiatric/Behavioral: Negative.   All other systems reviewed and are negative.      Objective:    Vitals:   10/30/18 0914  BP: 110/70  Pulse: (!) 56  Resp: 16  Temp: 98.6 F (37 C)  TempSrc: Oral  SpO2: 96%  Weight: 264 lb 4 oz (119.9 kg)  Height: 6\' 3"  (1.905 m)      Physical Exam Vitals signs and nursing note reviewed.  Constitutional:      General: He is not in acute distress.    Appearance: Normal appearance. He is  well-developed. He is not ill-appearing, toxic-appearing or diaphoretic.     Comments: Elderly male, non-toxic appearing, NAD  HENT:     Head: Normocephalic and atraumatic.     Jaw: No trismus.     Right Ear: Tympanic membrane, ear canal and external ear normal.     Left Ear: Tympanic membrane, ear canal and external ear normal.     Nose: No mucosal edema or rhinorrhea.     Right Sinus: No maxillary sinus tenderness or frontal sinus tenderness.     Left Sinus: No maxillary sinus tenderness or frontal sinus tenderness.     Mouth/Throat:     Mouth: Mucous membranes are moist.     Pharynx: Uvula midline. No oropharyngeal exudate, posterior oropharyngeal erythema or uvula swelling.  Eyes:     General: Lids are normal. No scleral icterus.       Right eye: No discharge.        Left eye: No discharge.     Conjunctiva/sclera: Conjunctivae normal.     Pupils: Pupils are equal, round, and reactive to light.  Neck:     Musculoskeletal: Normal range of motion and neck supple.     Vascular: No JVD.     Trachea: Trachea and phonation normal. No tracheal deviation.  Cardiovascular:     Rate and Rhythm: Regular rhythm. Bradycardia present.     Pulses: Normal pulses.          Radial pulses are 2+ on the right side and 2+ on the left side.       Posterior tibial pulses are 2+ on the right side and 2+ on the left side.     Heart sounds: Heart sounds are distant. No murmur. No friction rub. No gallop.   Pulmonary:     Effort: Pulmonary effort is normal. No tachypnea, accessory muscle usage, prolonged expiration or respiratory distress.     Breath sounds: Decreased air movement present. Decreased breath sounds and wheezing present. No rhonchi or rales.     Comments: Increased AP diameter, difficult to hear lung sounds, faint exp wheeze Abdominal:     General: Bowel sounds are  normal. There is no distension.     Palpations: Abdomen is soft.     Tenderness: There is no abdominal tenderness. There is no  guarding or rebound.  Musculoskeletal: Normal range of motion.     Right lower leg: Edema present.     Left lower leg: Edema present.     Comments: Pt wearing compression stockings, edema to b/l LE mild, non-pitting  Lymphadenopathy:     Cervical: No cervical adenopathy.  Skin:    General: Skin is warm and dry.     Capillary Refill: Capillary refill takes less than 2 seconds.     Findings: No rash.  Neurological:     Mental Status: He is alert and oriented to person, place, and time.     Gait: Gait normal.  Psychiatric:        Speech: Speech normal.        Behavior: Behavior normal.           Assessment & Plan:      ICD-10-CM   1. Cough R05 ipratropium-albuterol (DUONEB) 0.5-2.5 (3) MG/3ML nebulizer solution 3 mL    DG Chest 2 View    predniSONE (DELTASONE) 20 MG tablet    albuterol (PROVENTIL) (2.5 MG/3ML) 0.083% nebulizer solution    benzonatate (TESSALON) 100 MG capsule    azithromycin (ZITHROMAX) 250 MG tablet   suspect URI - lung sounds distant/hard to hear, nebulizer did not help much pt stated, lungs reevaluated after neb- had more wheeze -suspect some bronchitis secondary to URI, CXR to further eval - pt and wife are concerned that he has coughed since URI October 2019  Pt states hx of COPD - so may be AECOPD, remote smoking hx, quit over 20 years ago, 30+ years smoking hx - since cough is either prolonged or recurrent and may be AECOPD will tx with zpak  No concerning weight loss - few lbs with GI sx, no CP, no fever, night sweats   2. Diarrhea, unspecified type R19.7    3 weeks, improving, 1 bm today, no abd pain, suspect viral, follow up as needed if any worsening       Delsa Grana, PA-C 10/30/18 9:20 AM

## 2018-10-30 NOTE — Telephone Encounter (Signed)
Please see Chest xray results. I received call report. He has a new pulmonary nodule. Recommend CT Chest for follow up.

## 2018-10-30 NOTE — Patient Instructions (Signed)
   I think you have a touch of bronchitis and upper respiratory infection - If Dr. [name] says you have some COPD - we usually treat this more aggressively with Zpak   Acute Bronchitis, Adult Acute bronchitis is when air tubes (bronchi) in the lungs suddenly get swollen. The condition can make it hard to breathe. It can also cause these symptoms:  A cough.  Coughing up clear, yellow, or green mucus.  Wheezing.  Chest congestion.  Shortness of breath.  A fever.  Body aches.  Chills.  A sore throat. Follow these instructions at home:  Medicines  Take over-the-counter and prescription medicines only as told by your doctor.  If you were prescribed an antibiotic medicine, take it as told by your doctor. Do not stop taking the antibiotic even if you start to feel better. General instructions  Rest.  Drink enough fluids to keep your pee (urine) pale yellow.  Avoid smoking and secondhand smoke. If you smoke and you need help quitting, ask your doctor. Quitting will help your lungs heal faster.  Use an inhaler, cool mist vaporizer, or humidifier as told by your doctor.  Keep all follow-up visits as told by your doctor. This is important. How is this prevented? To lower your risk of getting this condition again:  Wash your hands often with soap and water. If you cannot use soap and water, use hand sanitizer.  Avoid contact with people who have cold symptoms.  Try not to touch your hands to your mouth, nose, or eyes.  Make sure to get the flu shot every year. Contact a doctor if:  Your symptoms do not get better in 2 weeks. Get help right away if:  You cough up blood.  You have chest pain.  You have very bad shortness of breath.  You become dehydrated.  You faint (pass out) or keep feeling like you are going to pass out.  You keep throwing up (vomiting).  You have a very bad headache.  Your fever or chills gets worse. This information is not intended to  replace advice given to you by your health care provider. Make sure you discuss any questions you have with your health care provider. Document Released: 03/05/2008 Document Revised: 05/01/2017 Document Reviewed: 03/07/2016 Elsevier Interactive Patient Education  2019 [employer].

## 2018-10-31 ENCOUNTER — Other Ambulatory Visit: Payer: Medicare Other

## 2018-10-31 ENCOUNTER — Other Ambulatory Visit: Payer: Self-pay | Admitting: Family Medicine

## 2018-10-31 DIAGNOSIS — R911 Solitary pulmonary nodule: Secondary | ICD-10-CM

## 2018-10-31 LAB — CBC WITH DIFFERENTIAL/PLATELET
Absolute Monocytes: 106 cells/uL — ABNORMAL LOW (ref 200–950)
Basophils Absolute: 27 cells/uL (ref 0–200)
Basophils Relative: 0.2 %
Eosinophils Absolute: 0 cells/uL — ABNORMAL LOW (ref 15–500)
Eosinophils Relative: 0 %
HCT: 45.6 % (ref 38.5–50.0)
Hemoglobin: 15.4 g/dL (ref 13.2–17.1)
Lymphs Abs: 625 cells/uL — ABNORMAL LOW (ref 850–3900)
MCH: 29.7 pg (ref 27.0–33.0)
MCHC: 33.8 g/dL (ref 32.0–36.0)
MCV: 87.9 fL (ref 80.0–100.0)
MPV: 9.8 fL (ref 7.5–12.5)
Monocytes Relative: 0.8 %
Neutro Abs: 12542 cells/uL — ABNORMAL HIGH (ref 1500–7800)
Neutrophils Relative %: 94.3 %
Platelets: 232 10*3/uL (ref 140–400)
RBC: 5.19 10*6/uL (ref 4.20–5.80)
RDW: 14 % (ref 11.0–15.0)
Total Lymphocyte: 4.7 %
WBC: 13.3 10*3/uL — ABNORMAL HIGH (ref 3.8–10.8)

## 2018-10-31 LAB — COMPREHENSIVE METABOLIC PANEL
AG Ratio: 1.7 (calc) (ref 1.0–2.5)
ALT: 18 U/L (ref 9–46)
AST: 23 U/L (ref 10–35)
Albumin: 4.3 g/dL (ref 3.6–5.1)
Alkaline phosphatase (APISO): 102 U/L (ref 40–115)
BUN: 17 mg/dL (ref 7–25)
CO2: 26 mmol/L (ref 20–32)
Calcium: 9.4 mg/dL (ref 8.6–10.3)
Chloride: 106 mmol/L (ref 98–110)
Creat: 0.96 mg/dL (ref 0.70–1.18)
Globulin: 2.6 g/dL (calc) (ref 1.9–3.7)
Glucose, Bld: 116 mg/dL — ABNORMAL HIGH (ref 65–99)
Potassium: 4.4 mmol/L (ref 3.5–5.3)
Sodium: 142 mmol/L (ref 135–146)
Total Bilirubin: 0.8 mg/dL (ref 0.2–1.2)
Total Protein: 6.9 g/dL (ref 6.1–8.1)

## 2018-11-06 ENCOUNTER — Ambulatory Visit
Admission: RE | Admit: 2018-11-06 | Discharge: 2018-11-06 | Disposition: A | Discharge status: Home / Self Care | Payer: Medicare Other | Source: Ambulatory Visit | Attending: Family Medicine | Admitting: Family Medicine

## 2018-11-06 DIAGNOSIS — J439 Emphysema, unspecified: Secondary | ICD-10-CM | POA: Diagnosis not present

## 2018-11-06 MED ORDER — IOPAMIDOL (ISOVUE-300) INJECTION 61%
75.0000 mL | Freq: Once | INTRAVENOUS | Status: AC | PRN
  Administered 2018-11-06: 75 mL via INTRAVENOUS

## 2018-11-11 ENCOUNTER — Encounter: Payer: Self-pay | Admitting: Family Medicine

## 2018-11-11 ENCOUNTER — Ambulatory Visit (INDEPENDENT_AMBULATORY_CARE_PROVIDER_SITE_OTHER): Payer: Medicare Other | Admitting: Family Medicine

## 2018-11-11 VITALS — BP 110/60 | HR 96 | Temp 98.2°F | Resp 16 | Ht 75.0 in | Wt 253.0 lb

## 2018-11-11 DIAGNOSIS — R911 Solitary pulmonary nodule: Secondary | ICD-10-CM

## 2018-11-11 MED ORDER — TIZANIDINE HCL 4 MG PO TABS: 4.0000 mg | ORAL_TABLET | Freq: Three times a day (TID) | ORAL | 1 refills | Status: DC | PRN

## 2018-11-11 MED ORDER — HYDROCODONE-HOMATROPINE 5-1.5 MG/5ML PO SYRP: 5.0000 mL | ORAL_SOLUTION | Freq: Three times a day (TID) | ORAL | 0 refills | Status: DC | PRN

## 2018-11-11 MED ORDER — TADALAFIL 5 MG PO TABS: 5.0000 mg | ORAL_TABLET | Freq: Every day | ORAL | 3 refills | Status: DC

## 2018-11-11 NOTE — Progress Notes (Signed)
Subjective:    Patient ID: Gregory Suarez, male    DOB: 12-Apr-1946, 73 y.o.   MRN: [id number]  HPI  Recently treated for cough and CXR revelaed:  IMPRESSION: 1. New 1.7 cm lower lobe nodule, only seen on the lateral view. Recommend chest CT for further evaluation. 2. COPD.  No active cardiopulmonary disease.  Follow up CT revealed: The density seen on the lateral chest x-ray of 10/30/2018 is felt to represent a confluence of vascular structures. 2.  Emphysema (ICD10-J43.9). 3.  Aortic Atherosclerosis (ICD10-I70.0). 4. Extensive coronary artery calcifications.  Patient states that his breathing is much better.  His cough is improved.  He is back to his chronic cough which is nonproductive and is more of a phlegm-like mucus in the back of his throat that he attributes to his sinuses.  He denies any wheezes.  He denies any chills.  He denies any chest pain.  He denies any shortness of breath. Past Medical History:  Diagnosis Date  . CAD (coronary artery disease)   . Elevated PSA   . Erectile dysfunction   . GERD (gastroesophageal reflux disease)   . Hyperlipidemia   . Hypertension   . Mitral valve regurgitation   . OSA treated with BiPAP   . RLS (restless legs syndrome)   . Tubular adenoma of colon    Past Surgical History:  Procedure Laterality Date  . ANKLE SURGERY    . CHOLECYSTECTOMY    . CHOLECYSTECTOMY     Current Outpatient Medications on File Prior to Visit  Medication Sig Dispense Refill  . albuterol (PROVENTIL) (2.5 MG/3ML) 0.083% nebulizer solution Take 3 mLs (2.5 mg total) by nebulization every 6 (six) hours as needed for wheezing or shortness of breath. 150 mL 1  . aspirin 325 MG tablet Take 325 mg by mouth daily.    Marland Kitchen atorvastatin (LIPITOR) 40 MG tablet Take 1 tablet (40 mg total) by mouth daily. 90 tablet 1  . azithromycin (ZITHROMAX) 250 MG tablet Take 2 tabs (500 mg) PO q d for 1d, then take 1 tab (250 mg) PO q d for day 2-5 6 each 0  . benzonatate  (TESSALON) 100 MG capsule Take 1 capsule (100 mg total) by mouth 3 (three) times daily as needed for cough. 30 capsule 0  . chlorpheniramine (ALLERGY) 4 MG tablet Take 4 mg by mouth daily as needed.    . finasteride (PROSCAR) 5 MG tablet Take 1 tablet (5 mg total) by mouth daily. 90 tablet 3  . fluticasone (FLONASE) 50 MCG/ACT nasal spray Place 2 sprays into both nostrils daily. 16 g 2  . metoprolol tartrate (LOPRESSOR) 25 MG tablet TAKE 1 TABLET BY MOUTH TWICE A DAY 180 tablet 2  . nitroGLYCERIN (NITROSTAT) 0.4 MG SL tablet Place 1 tablet (0.4 mg total) under the tongue every 5 (five) minutes as needed. 30 tablet 1  . pantoprazole (PROTONIX) 40 MG tablet Take 1 tablet (40 mg total) by mouth 2 (two) times daily. 180 tablet 3  . pramipexole (MIRAPEX) 0.25 MG tablet TAKE 2 TABLETS BY MOUTH EVERY DAY AT BEDTIME 180 tablet 0  . Probiotic Product (PROBIOTIC FORMULA PO) Take 1 tablet by mouth daily.    . tadalafil (CIALIS) 5 MG tablet Take 1 tablet (5 mg total) by mouth daily. 30 tablet 4  . tiZANidine (ZANAFLEX) 4 MG tablet Take 1 tablet (4 mg total) by mouth every 8 (eight) hours as needed for muscle spasms. 30 tablet 1  . valACYclovir (VALTREX) 1000 MG  tablet Take 1 tablet (1,000 mg total) by mouth daily. 30 tablet 1   No current facility-administered medications on file prior to visit.    No Known Allergies Social History   Socioeconomic History  . Marital status: Married    Spouse name: Not on file  . Number of children: Not on file  . Years of education: Not on file  . Highest education level: Not on file  Occupational History  . Occupation: RETIRED  Social Needs  . Financial resource strain: Not on file  . Food insecurity:    Worry: Not on file    Inability: Not on file  . Transportation needs:    Medical: Not on file    Non-medical: Not on file  Tobacco Use  . Smoking status: Former Smoker    Packs/day: 0.50    Years: 15.00    Pack years: 7.50    Types: Cigarettes    Last  attempt to quit: 10/01/1988    Years since quitting: 30.1  . Smokeless tobacco: Never Used  Substance and Sexual Activity  . Alcohol use: No    Alcohol/week: 0.0 standard drinks  . Drug use: No  . Sexual activity: Yes    Comment: married, 2 grown sons, retired maintenance man  Lifestyle  . Physical activity:    Days per week: Not on file    Minutes per session: Not on file  . Stress: Not on file  Relationships  . Social connections:    Talks on phone: Not on file    Gets together: Not on file    Attends religious service: Not on file    Active member of club or organization: Not on file    Attends meetings of clubs or organizations: Not on file    Relationship status: Not on file  . Intimate partner violence:    Fear of current or ex partner: Not on file    Emotionally abused: Not on file    Physically abused: Not on file    Forced sexual activity: Not on file  Other Topics Concern  . Not on file  Social History Narrative  . Not on file   Family History  Problem Relation Age of Onset  . Diabetes Unknown   . Breast cancer Unknown   . Coronary artery disease Unknown       Review of Systems  All other systems reviewed and are negative.      Objective:   Physical Exam  Constitutional: He is oriented to person, place, and time. He appears well-developed and well-nourished. No distress.  HENT:  Head: Normocephalic and atraumatic.  Right Ear: External ear normal.  Left Ear: External ear normal.  Nose: Nose normal.  Mouth/Throat: Oropharynx is clear and moist. No oropharyngeal exudate.  Eyes: Pupils are equal, round, and reactive to light. Conjunctivae and EOM are normal. Right eye exhibits no discharge. Left eye exhibits no discharge. No scleral icterus.  Neck: Normal range of motion. Neck supple. No JVD present. No tracheal deviation present. No thyromegaly present.  Cardiovascular: Normal rate, regular rhythm, normal heart sounds and intact distal pulses. Exam  reveals no gallop and no friction rub.  No murmur heard. Pulmonary/Chest: Effort normal and breath sounds normal. No stridor. No respiratory distress. He has no wheezes. He has no rales. He exhibits no tenderness.  Abdominal: Soft. Bowel sounds are normal. He exhibits no distension and no mass. There is no abdominal tenderness. There is no rebound and no guarding.  Musculoskeletal: Normal  range of motion.        General: No tenderness or edema.  Lymphadenopathy:    He has no cervical adenopathy.  Neurological: He is alert and oriented to person, place, and time. He has normal reflexes. No cranial nerve deficit. He exhibits normal muscle tone. Coordination normal.  Skin: Skin is warm. No rash noted. He is not diaphoretic. No erythema. No pallor.  Psychiatric: He has a normal mood and affect. His behavior is normal. Judgment and thought content normal.  Vitals reviewed.         Assessment & Plan:  Nodule on chest x-ray was found to be benign and actually due to confluence of blood vessels on the CAT scan.  There was no nodularity seen on chest x-ray.  No further follow-up is necessary for this.  Patient can resume his previous appointment schedule and follow-up as needed or annually with his physical exam

## 2018-11-12 ENCOUNTER — Encounter: Payer: Self-pay | Admitting: Cardiovascular Disease

## 2018-11-12 ENCOUNTER — Encounter

## 2018-11-12 ENCOUNTER — Ambulatory Visit (INDEPENDENT_AMBULATORY_CARE_PROVIDER_SITE_OTHER): Payer: Medicare Other | Admitting: Cardiovascular Disease

## 2018-11-12 VITALS — BP 130/66 | HR 53 | Ht 75.0 in | Wt 256.8 lb

## 2018-11-12 DIAGNOSIS — G4733 Obstructive sleep apnea (adult) (pediatric): Secondary | ICD-10-CM | POA: Diagnosis not present

## 2018-11-12 DIAGNOSIS — I2510 Atherosclerotic heart disease of native coronary artery without angina pectoris: Secondary | ICD-10-CM

## 2018-11-12 DIAGNOSIS — I700 Atherosclerosis of aorta: Secondary | ICD-10-CM

## 2018-11-12 DIAGNOSIS — I2584 Coronary atherosclerosis due to calcified coronary lesion: Secondary | ICD-10-CM | POA: Diagnosis not present

## 2018-11-12 DIAGNOSIS — I10 Essential (primary) hypertension: Secondary | ICD-10-CM

## 2018-11-12 DIAGNOSIS — E785 Hyperlipidemia, unspecified: Secondary | ICD-10-CM | POA: Diagnosis not present

## 2018-11-12 MED ORDER — ASPIRIN EC 81 MG PO TBEC: 81.0000 mg | DELAYED_RELEASE_TABLET | Freq: Every day | ORAL | 3 refills | Status: AC

## 2018-11-12 NOTE — Progress Notes (Signed)
Primary MD: Dr. Jenna Luo  PATIENT PROFILE: Zohan Shiflet is a 73 y.o. male who is referred through the courtesy of Dr. [name] for cardiology evaluation.  I last saw him in October 2017.  He presents for follow-up evaluation.   HPI:  Dace Denn is a former patient of Dr. Joni Fears.  He tells me he underwent heart catheterization almost 20 ago and was told of having mild blockage which medical therapy was recommended. He has a long history of sleep apnea and initially was followed by Dr. Humberto Seals.  He on his third CPAP machine.  He has a istory of significant obesity when I last saw him in 2017 he had lost over 50 pounds   He denied any exertional chest pain but admits to intermittent left sharp, achy discomfort which is nonexertional.  He denied associated palpitations or shortness of breath.  He admitted to leg swelling and wore an ankle brace..  When I last saw him, he was stable from a cardiac standpoint.  He underwent an echo Doppler study in December 2017 which showed an EF of 55 to 60%.  Over the past several years, he has done well with reference to chest pain or palpitations.  He had undergone a recent chest CT which showed emphysema, aortic atherosclerosis, as well as extensive coronary calcifications.  He has not been using his CPAP therapy and I obtained a download today in the office which showed only 2 days of use over the last 90 days.  He also has a history of restless leg syndrome.  He typically goes to bed between 9 and 11 PM and wakes up between 7 and 9 AM.  He had see Dr. [name] yesterday.and presents for cardiology evaluation  Past Medical History:  Diagnosis Date  . CAD (coronary artery disease)   . Elevated PSA   . Erectile dysfunction   . GERD (gastroesophageal reflux disease)   . Hyperlipidemia   . Hypertension   . Mitral valve regurgitation   . OSA treated with BiPAP   . RLS (restless legs syndrome)   . Tubular adenoma of colon     Past Surgical  History:  Procedure Laterality Date  . ANKLE SURGERY    . CHOLECYSTECTOMY    . CHOLECYSTECTOMY      No Known Allergies  Current Outpatient Medications  Medication Sig Dispense Refill  . atorvastatin (LIPITOR) 40 MG tablet Take 1 tablet (40 mg total) by mouth daily. 90 tablet 1  . azithromycin (ZITHROMAX) 250 MG tablet Take 2 tabs (500 mg) PO q d for 1d, then take 1 tab (250 mg) PO q d for day 2-5 6 each 0  . benzonatate (TESSALON) 100 MG capsule Take 1 capsule (100 mg total) by mouth 3 (three) times daily as needed for cough. 30 capsule 0  . chlorpheniramine (ALLERGY) 4 MG tablet Take 4 mg by mouth daily as needed.    . finasteride (PROSCAR) 5 MG tablet Take 1 tablet (5 mg total) by mouth daily. 90 tablet 3  . fluticasone (FLONASE) 50 MCG/ACT nasal spray Place 2 sprays into both nostrils daily. 16 g 2  . HYDROcodone-homatropine (HYCODAN) 5-1.5 MG/5ML syrup Take 5 mLs by mouth every 8 (eight) hours as needed for cough. 120 mL 0  . metoprolol tartrate (LOPRESSOR) 25 MG tablet TAKE 1 TABLET BY MOUTH TWICE A DAY 180 tablet 2  . pantoprazole (PROTONIX) 40 MG tablet Take 1 tablet (40 mg total) by mouth 2 (two) times daily.  180 tablet 3  . pramipexole (MIRAPEX) 0.25 MG tablet TAKE 2 TABLETS BY MOUTH EVERY DAY AT BEDTIME 180 tablet 0  . Probiotic Product (PROBIOTIC FORMULA PO) Take 1 tablet by mouth daily.    . tadalafil (CIALIS) 5 MG tablet Take 1 tablet (5 mg total) by mouth daily. 90 tablet 3  . tiZANidine (ZANAFLEX) 4 MG tablet Take 1 tablet (4 mg total) by mouth every 8 (eight) hours as needed for muscle spasms. 30 tablet 1  . valACYclovir (VALTREX) 1000 MG tablet Take 1 tablet (1,000 mg total) by mouth daily. 30 tablet 1  . albuterol (PROVENTIL) (2.5 MG/3ML) 0.083% nebulizer solution Take 3 mLs (2.5 mg total) by nebulization every 6 (six) hours as needed for wheezing or shortness of breath. (Patient not taking: Reported on 11/12/2018) 150 mL 1  . aspirin EC 81 MG tablet Take 1 tablet (81 mg  total) by mouth daily. 90 tablet 3  . nitroGLYCERIN (NITROSTAT) 0.4 MG SL tablet Place 1 tablet (0.4 mg total) under the tongue every 5 (five) minutes as needed. (Patient not taking: Reported on 11/12/2018) 30 tablet 1   No current facility-administered medications for this visit.     Social History   Socioeconomic History  . Marital status: Married    Spouse name: Not on file  . Number of children: Not on file  . Years of education: Not on file  . Highest education level: Not on file  Occupational History  . Occupation: RETIRED  Social Needs  . Financial resource strain: Not on file  . Food insecurity:    Worry: Not on file    Inability: Not on file  . Transportation needs:    Medical: Not on file    Non-medical: Not on file  Tobacco Use  . Smoking status: Former Smoker    Packs/day: 0.50    Years: 15.00    Pack years: 7.50    Types: Cigarettes    Last attempt to quit: 10/01/1988    Years since quitting: 30.1  . Smokeless tobacco: Never Used  Substance and Sexual Activity  . Alcohol use: No    Alcohol/week: 0.0 standard drinks  . Drug use: No  . Sexual activity: Yes    Comment: married, 2 grown sons, retired maintenance man  Lifestyle  . Physical activity:    Days per week: Not on file    Minutes per session: Not on file  . Stress: Not on file  Relationships  . Social connections:    Talks on phone: Not on file    Gets together: Not on file    Attends religious service: Not on file    Active member of club or organization: Not on file    Attends meetings of clubs or organizations: Not on file    Relationship status: Not on file  . Intimate partner violence:    Fear of current or ex partner: Not on file    Emotionally abused: Not on file    Physically abused: Not on file    Forced sexual activity: Not on file  Other Topics Concern  . Not on file  Social History Narrative  . Not on file   Additional social history is notable that he is married for 44 years.   He has 2 children and 4 grandchildren.  He previously worked as a Conservation officer, nature gentleman.  He remotely smoked but quit in 1990.  He is not routinely exercise.  Family History  Problem Relation Age of  Onset  . Diabetes Unknown   . Breast cancer Unknown   . Coronary artery disease Unknown    Family history is notable that his mother died at 65 with cancer.  Father had heart disease and diabetes mellitus.  He has 3 brothers, one had heart valve replacement in 2017 and another had undergone CABG revascularization surgery.  He has 2 living sisters and one sister died in an accident.  ROS General: Negative; No fevers, chills, or night sweats HEENT: Negative; No changes in vision or hearing, sinus congestion, difficulty swallowing Pulmonary: Negative; No cough, wheezing, shortness of breath, hemoptysis Cardiovascular:  See HPI; No chest pain, presyncope, syncope, palpitations, edema GI: Negative; No nausea, vomiting, diarrhea, or abdominal pain GU: Negative; No dysuria, hematuria, or difficulty voiding Musculoskeletal: Negative; no myalgias, joint pain, or weakness Hematologic/Oncologic: Negative; no easy bruising, bleeding Endocrine: Negative; no heat/cold intolerance; no diabetes Neuro: Negative; no changes in balance, headaches Skin: Negative; No rashes or skin lesions Psychiatric: Negative; No behavioral problems, depression Sleep: Positive for obstructive sleep apnea on his third CPAP machine.  No daytime sleepiness, hypersomnolence, bruxism, restless legs, hypnogagnic hallucinations Other comprehensive 14 point system review is negative   Physical Exam BP 130/66   Pulse (!) 53   Ht _0  (1.905 m)   Wt 256 lb 12.8 oz (116.5 kg)   SpO2 97%   BMI 32.10 kg/m    Repeat blood pressure by me was 124/68  Wt Readings from Last 3 Encounters:  11/12/18 256 lb 12.8 oz (116.5 kg)  11/11/18 253 lb (114.8 kg)  10/30/18 264 lb 4 oz (119.9 kg)   General: Alert, oriented, no  distress.  Skin: normal turgor, no rashes, warm and dry HEENT: Normocephalic, atraumatic. Pupils equal round and reactive to light; sclera anicteric; extraocular muscles intact;  Nose without nasal septal hypertrophy Mouth/Parynx benign; Mallinpatti scale 3 Neck: No JVD, no carotid bruits; normal carotid upstroke Lungs: clear to ausculatation and percussion; no wheezing or rales Chest wall: without tenderness to palpitation Heart: PMI not displaced, RRR, s1 s2 normal, 1/6 systolic murmur, no diastolic murmur, no rubs, gallops, thrills, or heaves Abdomen: soft, nontender; no hepatosplenomehaly, BS+; abdominal aorta nontender and not dilated by palpation. Back: no CVA tenderness Pulses 2+ Musculoskeletal: full range of motion, normal strength, no joint deformities Extremities: no clubbing cyanosis or edema, Homan's sign negative  Neurologic: grossly nonfocal; Cranial nerves grossly wnl Psychologic: Normal mood and affect  ECG (independently read by me): Sinus bradycardia 53 bpm.  LVH by voltage criteria.  Left anterior hemiblock.  Normal intervals.  October 2017 ECG (independently read by me): Marked sinus bradycardia at 42 bpm.  No ectopy.  LABS:  BMP Latest Ref Rng & Units 10/31/2018 07/17/2018 02/07/2017  Glucose 65 - 99 mg/dL 116(H) 89 83  BUN 7 - 25 mg/dL _1 Creatinine 0.70 - 1.18 mg/dL 0.96 0.95 0.96  BUN/Creat Ratio 6 - 22 (calc) NOT APPLICABLE NOT APPLICABLE -  Sodium 462 - 146 mmol/L 142 141 141  Potassium 3.5 - 5.3 mmol/L 4.4 4.6 4.0  Chloride 98 - 110 mmol/L 106 107 105  CO2 20 - 32 mmol/L _2 Calcium 8.6 - 10.3 mg/dL 9.4 8.8 9.1     Hepatic Function Latest Ref Rng & Units 10/31/2018 07/17/2018 02/07/2017  Total Protein 6.1 - 8.1 g/dL 6.9 6.6 6.8  Albumin 3.6 - 5.1 g/dL - - 4.0  AST 10 - 35 U/L _3 ALT 9 - 46 U/L  _0 Alk Phosphatase 40 - 115 U/L - - 83  Total Bilirubin 0.2 - 1.2 mg/dL 0.8 0.7 1.0    CBC Latest Ref Rng & Units 10/31/2018  07/17/2018 02/07/2017  WBC 3.8 - 10.8 Thousand/uL 13.3(H) 7.6 6.9  Hemoglobin 13.2 - 17.1 g/dL 15.4 15.2 15.7  Hematocrit 38.5 - 50.0 % 45.6 45.3 46.5  Platelets 140 - 400 Thousand/uL 232 194 172   Lab Results  Component Value Date   MCV 87.9 10/31/2018   MCV 88.5 07/17/2018   MCV 91.4 02/07/2017   Lab Results  Component Value Date   TSH 1.89 02/07/2017   No results found for: HGBA1C   BNP No results found for: BNP  ProBNP No results found for: PROBNP   Lipid Panel     Component Value Date/Time   CHOL 105 07/17/2018 0818   TRIG 102 07/17/2018 0818   HDL 37 (L) 07/17/2018 0818   CHOLHDL 2.8 07/17/2018 0818   VLDL 18 02/07/2017 0809   LDLCALC 49 07/17/2018 0818    RADIOLOGY: Dg Chest 2 View  Result Date: 10/30/2018 CLINICAL DATA:  Dry cough for the past 2 weeks. EXAM: CHEST - 2 VIEW COMPARISON:  Chest x-ray dated July 19, 2006. FINDINGS: The heart size and mediastinal contours are within normal limits. Normal pulmonary vascularity. The lungs remain mildly hyperinflated with emphysematous changes. New 1.7 cm lower lobe nodule, seen only on the lateral view. Bilateral nipple shadows. Mild diffuse peribronchial thickening with chronically coarsened interstitial markings, likely smoking-related. No focal consolidation, pleural effusion, or pneumothorax. No acute osseous abnormality. IMPRESSION: 1. New 1.7 cm lower lobe nodule, only seen on the lateral view. Recommend chest CT for further evaluation. 2. COPD.  No active cardiopulmonary disease. These results will be called to the ordering clinician or representative by the Radiologist Assistant, and communication documented in the PACS or zVision Dashboard. Electronically Signed   By: Titus Dubin M.D.   On: 10/30/2018 15:41   Ct Chest W Contrast  Result Date: 11/07/2018 CLINICAL DATA:  Abnormal chest x-ray with pulmonary nodules seen on chest x-ray of 10/30/2018. EXAM: CT CHEST WITH CONTRAST TECHNIQUE: Multidetector CT  imaging of the chest was performed during intravenous contrast administration. CONTRAST:  24m ISOVUE-300 IOPAMIDOL (ISOVUE-300) INJECTION 61% COMPARISON:  Radiographs dated 10/30/2018 and chest CT dated 09/22/2012 FINDINGS: Cardiovascular: The heart size is normal. Mediastinum/Nodes: There are multiple small mediastinal lymph nodes the largest being 13 mm. These are slightly more prominent than on the prior study and are most likely benign reactive lymph nodes. No axillary adenopathy. Thyroid gland, trachea, and esophagus are normal. Extensive coronary artery calcifications. Aortic atherosclerosis. Lungs/Pleura: Bilateral apical pleural scarring. Diffuse emphysematous changes. There are no pulmonary nodules. No infiltrates or effusions. The density seen on the lateral view of the chest x-ray this felt to represent a confluence of vascular structures. Upper Abdomen: No significant abnormality. Musculoskeletal: No acute abnormality. Osteophytes fuse much of the thoracic spine. IMPRESSION: 1. No pulmonary nodules. The density seen on the lateral chest x-ray of 10/30/2018 is felt to represent a confluence of vascular structures. 2.  Emphysema (ICD10-J43.9). 3.  Aortic Atherosclerosis (ICD10-I70.0). 4. Extensive coronary artery calcifications. Electronically Signed   By: JLorriane ShireM.D.   On: 11/07/2018 07:57   IMPRESSION: 1. Coronary artery calcification   2. CAD in native artery   3. [place]ortic atherosclerosis ([place]   4. Essential hypertension   5. OSA (obstructive sleep apnea)   6. Hyperlipidemia with target LDL less than 70  ASSESSMENT AND PLAN: Mr. Antonie Borjon is a 73 year old Caucasian male who has previously documented mild CAD.  Catheterization was over 20 years ago.  His blood pressure today is controlled at 124/68 when repeated by me on metoprolol 25 mg twice a day.  He denies any classic anginal symptoms.  He has been taking full dose aspirin.  I suggested he reduce this to 81 mg.  He had  undergone a recent CT scan which showed emphysema, aortic atherosclerosis, as well as extensive coronary calcification.  He and a download of his CPAP therapy.  He is not compliant with only 2 days of use over the last 90 days.  He has a BiPAP unit with a minimum EPAP pressure of 14 and maximum IPAP pressure of 18.  When used was 1.0.  A long discussion with him today regarding the importance of continued CPAP therapy.  I discussed its implications with reference to cardiovascular health.  He continues to be on atorvastatin 40 mg for hyperlipidemia with target LDL less than 70.  With his known CAD over 20 years ago, and recent CT scan showing significant coronary calcification I have recommended that he undergo a coronary CTA to reassess coronary plaque and also to obtain a calcium score.  We will repeat a download in 1 month to reassess his CPAP compliance.  He continues to be on atorvastatin 40 mg for hyperlipidemia with target LDL less than 70.  I will see him in 2 months for cardiology reevaluation.  Time spent: 25 minutes  Troy Sine, MD, [facility] 11/14/2018 7:36 PM

## 2018-11-12 NOTE — Patient Instructions (Addendum)
Medication Instructions:  The current medical regimen is effective;  continue present plan and medications.  If you need a refill on your cardiac medications before your next appointment, please call your pharmacy.   Lab work: BMET 1 week before CT  If you have labs (blood work) drawn today and your tests are completely normal, you will receive your results only by: Marland Kitchen MyChart Message (if you have MyChart) OR . A paper copy in the mail If you have any lab test that is abnormal or we need to change your treatment, we will call you to review the results.  Testing/Procedures: Your physician has requested that you have cardiac CT. Cardiac computed tomography (CT) is a painless test that uses an x-ray machine to take clear, detailed pictures of your heart. For further information please visit HugeFiesta.tn. Please follow instruction sheet as given.   Follow-Up: At [facility], you and your health needs are our priority.  As part of our continuing mission to provide you with exceptional heart care, we have created designated Provider Care Teams.  These Care Teams include your primary Cardiologist (physician) and Advanced Practice Providers (APPs -  Physician Assistants and Nurse Practitioners) who all work together to provide you with the care you need, when you need it. You will need a follow up appointment in 2 months.  Please call our office 2 months in advance to schedule this appointment.  You may see Dr.Kelly or one of the following Advanced Practice Providers on your designated Care Team: Almyra Deforest, [place] . Fabian Sharp, PA-C  Any Other Special Instructions Will Be Listed Below (If Applicable).    Please arrive at the [facility] main entrance of [facility] at xx:xx AM (30-45 minutes prior to test start time)  [facility] [place], [place] [address] [phone]  Proceed to the [facility] Radiology Department (First Floor).  Please follow  these instructions carefully (unless otherwise directed):  Hold all erectile dysfunction medications at least 48 hours prior to test.  On the Night Before the Test: . Be sure to Drink plenty of water. . Do not consume any caffeinated/decaffeinated beverages or chocolate 12 hours prior to your test. . Do not take any antihistamines 12 hours prior to your test. . If you take Metformin do not take 24 hours prior to test.  On the Day of the Test: . Drink plenty of water. Do not drink any water within one hour of the test. . Do not eat any food 4 hours prior to the test. . You may take your regular medications prior to the test.  . Take metoprolol (Lopressor) two hours prior to test. . HOLD Furosemide/Hydrochlorothiazide morning of the test.  After the Test: . Drink plenty of water. . After receiving IV contrast, you may experience a mild flushed feeling. This is normal. . On occasion, you may experience a mild rash up to 24 hours after the test. This is not dangerous. If this occurs, you can take Benadryl 25 mg and increase your fluid intake. . If you experience trouble breathing, this can be serious. If it is severe call 911 IMMEDIATELY. If it is mild, please call our office. . If you take any of these medications: Glipizide/Metformin, Avandament, Glucavance, please do not take 48 hours after completing test.

## 2018-11-14 ENCOUNTER — Encounter: Payer: Self-pay | Admitting: Cardiovascular Disease

## 2018-12-04 ENCOUNTER — Telehealth (HOSPITAL_COMMUNITY): Payer: Self-pay | Admitting: Emergency Medicine

## 2018-12-04 ENCOUNTER — Encounter (HOSPITAL_COMMUNITY): Payer: Self-pay | Admitting: Emergency Medicine

## 2018-12-04 NOTE — Telephone Encounter (Signed)
Reaching out to patient to offer assistance regarding upcoming cardiac imaging study; pt verbalizes understanding of appt date/time, parking situation and where to check in, pre-test NPO status and medications ordered, and verified current allergies; name and call back number provided for further questions should they arise Shakari Qazi RN Navigator Cardiac Imaging [place] [facility] [phone] office [phone] cell 

## 2018-12-05 ENCOUNTER — Other Ambulatory Visit: Payer: Self-pay

## 2018-12-05 ENCOUNTER — Ambulatory Visit (HOSPITAL_COMMUNITY): Payer: Medicare Other

## 2018-12-05 ENCOUNTER — Ambulatory Visit (HOSPITAL_COMMUNITY)
Admission: RE | Admit: 2018-12-05 | Discharge: 2018-12-05 | Disposition: A | Discharge status: Home / Self Care | Payer: Medicare Other | Source: Ambulatory Visit | Attending: Cardiovascular Disease | Admitting: Cardiovascular Disease

## 2018-12-05 DIAGNOSIS — R9389 Abnormal findings on diagnostic imaging of other specified body structures: Secondary | ICD-10-CM

## 2018-12-05 DIAGNOSIS — I2510 Atherosclerotic heart disease of native coronary artery without angina pectoris: Secondary | ICD-10-CM

## 2018-12-05 DIAGNOSIS — I2584 Coronary atherosclerosis due to calcified coronary lesion: Principal | ICD-10-CM

## 2018-12-05 LAB — BASIC METABOLIC PANEL
BUN/Creatinine Ratio: 13 (ref 10–24)
BUN: 12 mg/dL (ref 8–27)
CO2: 21 mmol/L (ref 20–29)
Calcium: 9 mg/dL (ref 8.6–10.2)
Chloride: 106 mmol/L (ref 96–106)
Creatinine, Ser: 0.91 mg/dL (ref 0.76–1.27)
GFR calc Af Amer: 97 mL/min/{1.73_m2} (ref >59)
GFR calc non Af Amer: 84 mL/min/{1.73_m2} (ref >59)
Glucose: 83 mg/dL (ref 65–99)
Potassium: 4.9 mmol/L (ref 3.5–5.2)
Sodium: 141 mmol/L (ref 134–144)

## 2018-12-05 MED ORDER — NITROGLYCERIN 0.4 MG SL SUBL
SUBLINGUAL_TABLET | SUBLINGUAL | Status: AC
  Filled 2018-12-05: qty 2

## 2018-12-05 MED ORDER — NITROGLYCERIN 0.4 MG SL SUBL
0.8000 mg | SUBLINGUAL_TABLET | Freq: Once | SUBLINGUAL | Status: DC
  Filled 2018-12-05: qty 25

## 2018-12-05 MED ORDER — IOHEXOL 350 MG/ML SOLN: 80.0000 mL | Freq: Once | INTRAVENOUS | Status: DC | PRN

## 2018-12-05 NOTE — Progress Notes (Addendum)
Patient test canceled due to patient taking Cialis around 9pm on 3/5. Johnsie Cancel, MD notified and informed staff patient procedure cant be done today due to this medication and requirement to give SL nitro for exam. Per Nishan patient can not take Cialis for three days before exam. Clarise Cruz, Heart Navigator notified. No medication was given to patient.

## 2018-12-30 ENCOUNTER — Ambulatory Visit (HOSPITAL_COMMUNITY): Payer: Medicare Other

## 2019-01-16 ENCOUNTER — Other Ambulatory Visit: Payer: Self-pay | Admitting: Family Medicine

## 2019-01-16 DIAGNOSIS — Z0000 Encounter for general adult medical examination without abnormal findings: Secondary | ICD-10-CM

## 2019-01-16 MED ORDER — PRAMIPEXOLE DIHYDROCHLORIDE 0.25 MG PO TABS: ORAL_TABLET | ORAL | 1 refills | Status: DC

## 2019-01-16 MED ORDER — ATORVASTATIN CALCIUM 40 MG PO TABS: 40.0000 mg | ORAL_TABLET | Freq: Every day | ORAL | 1 refills | Status: DC

## 2019-01-16 NOTE — Addendum Note (Signed)
Addended by: Shary Decamp B on: 01/16/2019 02:13 PM   Modules accepted: Orders

## 2019-01-21 ENCOUNTER — Other Ambulatory Visit: Payer: Self-pay | Admitting: Family Medicine

## 2019-01-21 DIAGNOSIS — Z0000 Encounter for general adult medical examination without abnormal findings: Secondary | ICD-10-CM

## 2019-01-21 MED ORDER — PRAMIPEXOLE DIHYDROCHLORIDE 0.25 MG PO TABS: ORAL_TABLET | ORAL | 1 refills | Status: DC

## 2019-01-21 MED ORDER — ATORVASTATIN CALCIUM 40 MG PO TABS: 40.0000 mg | ORAL_TABLET | Freq: Every day | ORAL | 1 refills | Status: DC

## 2019-01-21 NOTE — Addendum Note (Signed)
Addended by: Shary Decamp B on: 01/21/2019 02:43 PM   Modules accepted: Orders

## 2019-01-23 ENCOUNTER — Ambulatory Visit (HOSPITAL_COMMUNITY): Payer: Medicare Other

## 2019-01-27 ENCOUNTER — Ambulatory Visit: Payer: Medicare Other | Admitting: Cardiovascular Disease

## 2019-01-27 ENCOUNTER — Other Ambulatory Visit: Payer: Self-pay | Admitting: *Deleted

## 2019-01-27 MED ORDER — METOPROLOL TARTRATE 25 MG PO TABS: 25.0000 mg | ORAL_TABLET | Freq: Two times a day (BID) | ORAL | 2 refills | Status: DC

## 2019-01-29 ENCOUNTER — Other Ambulatory Visit: Payer: Self-pay | Admitting: Family Medicine

## 2019-01-29 MED ORDER — METOPROLOL TARTRATE 25 MG PO TABS: 25.0000 mg | ORAL_TABLET | Freq: Two times a day (BID) | ORAL | 2 refills | Status: DC

## 2019-02-02 DIAGNOSIS — I2510 Atherosclerotic heart disease of native coronary artery without angina pectoris: Secondary | ICD-10-CM | POA: Diagnosis not present

## 2019-02-02 DIAGNOSIS — I2584 Coronary atherosclerosis due to calcified coronary lesion: Secondary | ICD-10-CM | POA: Diagnosis not present

## 2019-02-02 LAB — BASIC METABOLIC PANEL
BUN/Creatinine Ratio: 20 (ref 10–24)
BUN: 18 mg/dL (ref 8–27)
CO2: 20 mmol/L (ref 20–29)
Calcium: 8.8 mg/dL (ref 8.6–10.2)
Chloride: 106 mmol/L (ref 96–106)
Creatinine, Ser: 0.9 mg/dL (ref 0.76–1.27)
GFR calc Af Amer: 98 mL/min/{1.73_m2} (ref >59)
GFR calc non Af Amer: 85 mL/min/{1.73_m2} (ref >59)
Glucose: 88 mg/dL (ref 65–99)
Potassium: 3.7 mmol/L (ref 3.5–5.2)
Sodium: 142 mmol/L (ref 134–144)

## 2019-02-03 ENCOUNTER — Telehealth (HOSPITAL_COMMUNITY): Payer: Self-pay | Admitting: Emergency Medicine

## 2019-02-03 NOTE — Telephone Encounter (Signed)
Reaching out to patient to offer assistance regarding upcoming cardiac imaging study; pt verbalizes understanding of appt date/time, parking situation and where to check in, pre-test NPO status and medications ordered, and verified current allergies; name and call back number provided for further questions should they arise Marchia Bond RN [place] [facility] [phone] office [phone] cell  Spoke with patients wife who denied covid 19 symptoms, visitor policy reviewed with her.  Also instructed not to take cialis today or tomorrow, instructed to take metoprolol 2 hr prior to scan

## 2019-02-04 ENCOUNTER — Other Ambulatory Visit: Payer: Self-pay

## 2019-02-04 ENCOUNTER — Ambulatory Visit (HOSPITAL_COMMUNITY)
Admission: RE | Admit: 2019-02-04 | Discharge: 2019-02-04 | Disposition: A | Discharge status: Home / Self Care | Payer: Medicare Other | Source: Ambulatory Visit | Attending: Cardiovascular Disease | Admitting: Cardiovascular Disease

## 2019-02-04 DIAGNOSIS — I2510 Atherosclerotic heart disease of native coronary artery without angina pectoris: Secondary | ICD-10-CM | POA: Diagnosis not present

## 2019-02-04 DIAGNOSIS — I2584 Coronary atherosclerosis due to calcified coronary lesion: Secondary | ICD-10-CM | POA: Diagnosis not present

## 2019-02-04 MED ORDER — NITROGLYCERIN 0.4 MG SL SUBL
0.8000 mg | SUBLINGUAL_TABLET | Freq: Once | SUBLINGUAL | Status: AC
  Administered 2019-02-04: 0.8 mg via SUBLINGUAL
  Filled 2019-02-04: qty 25

## 2019-02-04 MED ORDER — IOHEXOL 350 MG/ML SOLN
80.0000 mL | Freq: Once | INTRAVENOUS | Status: AC | PRN
  Administered 2019-02-04: 10:00:00 80 mL via INTRAVENOUS

## 2019-02-04 MED ORDER — NITROGLYCERIN 0.4 MG SL SUBL
SUBLINGUAL_TABLET | SUBLINGUAL | Status: AC
  Filled 2019-02-04: qty 2

## 2019-02-04 NOTE — Progress Notes (Signed)
CT complete. Patient denies any complaints. Offered patient snack and beverage.

## 2019-02-04 NOTE — Progress Notes (Signed)
Patient ambulatory out of  Department with steady gait noted. Denies any complaints.

## 2019-02-06 ENCOUNTER — Telehealth: Payer: Self-pay | Admitting: Physician Assistant

## 2019-02-06 DIAGNOSIS — Z01812 Encounter for preprocedural laboratory examination: Secondary | ICD-10-CM

## 2019-02-06 DIAGNOSIS — I10 Essential (primary) hypertension: Secondary | ICD-10-CM

## 2019-02-06 DIAGNOSIS — I2510 Atherosclerotic heart disease of native coronary artery without angina pectoris: Secondary | ICD-10-CM

## 2019-02-06 NOTE — Telephone Encounter (Signed)
Home phone/ declined my chart/ consent/ pre reg completed °

## 2019-02-09 ENCOUNTER — Telehealth: Payer: Self-pay

## 2019-02-09 ENCOUNTER — Telehealth (INDEPENDENT_AMBULATORY_CARE_PROVIDER_SITE_OTHER): Payer: Medicare Other | Admitting: Physician Assistant

## 2019-02-09 ENCOUNTER — Other Ambulatory Visit (HOSPITAL_COMMUNITY): Payer: Self-pay | Admitting: Cardiology

## 2019-02-09 ENCOUNTER — Encounter: Payer: Self-pay | Admitting: Physician Assistant

## 2019-02-09 ENCOUNTER — Other Ambulatory Visit: Payer: Self-pay

## 2019-02-09 DIAGNOSIS — G4733 Obstructive sleep apnea (adult) (pediatric): Secondary | ICD-10-CM

## 2019-02-09 DIAGNOSIS — Z01818 Encounter for other preprocedural examination: Secondary | ICD-10-CM

## 2019-02-09 DIAGNOSIS — I10 Essential (primary) hypertension: Secondary | ICD-10-CM

## 2019-02-09 DIAGNOSIS — I25119 Atherosclerotic heart disease of native coronary artery with unspecified angina pectoris: Secondary | ICD-10-CM

## 2019-02-09 DIAGNOSIS — E785 Hyperlipidemia, unspecified: Secondary | ICD-10-CM

## 2019-02-09 DIAGNOSIS — R931 Abnormal findings on diagnostic imaging of heart and coronary circulation: Secondary | ICD-10-CM

## 2019-02-09 NOTE — Progress Notes (Signed)
Error

## 2019-02-09 NOTE — Telephone Encounter (Signed)
Virtual Visit Pre-Appointment Phone Call  "(Name), I am calling you today to discuss your upcoming appointment. We are currently trying to limit exposure to the virus that causes COVID-19 by seeing patients at home rather than in the office."  1. "What is the BEST phone number to call the day of the visit?" - include this in appointment notes  2. "Do you have or have access to (through a family member/friend) a smartphone with video capability that we can use for your visit?" a. If yes - list this number in appt notes as "cell" (if different from BEST phone #) and list the appointment type as a VIDEO visit in appointment notes b. If no - list the appointment type as a PHONE visit in appointment notes  3. Confirm consent - "In the setting of the current Covid19 crisis, you are scheduled for a PHONE visit with your provider on 02/09/2019 at 8:00AM.  Just as we do with many in-office visits, in order for you to participate in this visit, we must obtain consent.  If you'd like, I can send this to your mychart (if signed up) or email for you to review.  Otherwise, I can obtain your verbal consent now.  All virtual visits are billed to your insurance company just like a normal visit would be.  By agreeing to a virtual visit, we'd like you to understand that the technology does not allow for your provider to perform an examination, and thus may limit your provider's ability to fully assess your condition. If your provider identifies any concerns that need to be evaluated in person, we will make arrangements to do so.  Finally, though the technology is pretty good, we cannot assure that it will always work on either your or our end, and in the setting of a video visit, we may have to convert it to a phone-only visit.  In either situation, we cannot ensure that we have a secure connection.  Are you willing to proceed?" STAFF: Did the patient verbally acknowledge consent to telehealth visit? Document YES/NO  here: YES   4. Advise patient to be prepared - "Two hours prior to your appointment, go ahead and check your blood pressure, pulse, oxygen saturation, and your weight (if you have the equipment to check those) and write them all down. When your visit starts, your provider will ask you for this information. If you have an Apple Watch or Kardia device, please plan to have heart rate information ready on the day of your appointment. Please have a pen and paper handy nearby the day of the visit as well."  5. Give patient instructions for MyChart download to smartphone OR Doximity/Doxy.me as below if video visit (depending on what platform provider is using)  6. Inform patient they will receive a phone call 15 minutes prior to their appointment time (may be from unknown caller ID) so they should be prepared to answer    TELEPHONE CALL NOTE  Gregory Suarez has been deemed a candidate for a follow-up tele-health visit to limit community exposure during the Covid-19 pandemic. I spoke with the patient via phone to ensure availability of phone/video source, confirm preferred email & phone number, and discuss instructions and expectations.  I reminded Gregory Suarez to be prepared with any vital sign and/or heart rhythm information that could potentially be obtained via home monitoring, at the time of his visit. I reminded Gregory Suarez to expect a phone call prior to his visit.  Gregory Suarez  Gregory Suarez, Gregory Suarez 02/09/2019 8:04 AM   INSTRUCTIONS FOR DOWNLOADING THE MYCHART APP TO SMARTPHONE  - The patient must first make sure to have activated MyChart and know their login information - If Apple, go to [employer] and type in MyChart in the search bar and download the app. If Android, ask patient to go to [employer] and type in [place] in the search bar and download the app. The app is free but as with any other app downloads, their phone may require them to verify saved payment information or Apple/Android password.   - The patient will need to then log into the app with their MyChart username and password, and select [facility] as their healthcare provider to link the account. When it is time for your visit, go to the MyChart app, find appointments, and click Begin Video Visit. Be sure to Select Allow for your device to access the Microphone and Camera for your visit. You will then be connected, and your provider will be with you shortly.  **If they have any issues connecting, or need assistance please contact MyChart service desk [phone])**  **If using a computer, in order to ensure the best quality for their visit they will need to use either of the following Internet Browsers: [employer], or Google Chrome**  IF USING DOXIMITY or DOXY.ME - The patient will receive a link just prior to their visit by text.     FULL LENGTH CONSENT FOR TELE-HEALTH VISIT   I hereby voluntarily request, consent and authorize [facility] and its employed or contracted physicians, physician assistants, nurse practitioners or other licensed health care professionals (the Practitioner), to provide me with telemedicine health care services (the "Services") as deemed necessary by the treating Practitioner. I acknowledge and consent to receive the Services by the Practitioner via telemedicine. I understand that the telemedicine visit will involve communicating with the Practitioner through live audiovisual communication technology and the disclosure of certain medical information by electronic transmission. I acknowledge that I have been given the opportunity to request an in-person assessment or other available alternative prior to the telemedicine visit and am voluntarily participating in the telemedicine visit.  I understand that I have the right to withhold or withdraw my consent to the use of telemedicine in the course of my care at any time, without affecting my right to future care or treatment, and that  the Practitioner or I may terminate the telemedicine visit at any time. I understand that I have the right to inspect all information obtained and/or recorded in the course of the telemedicine visit and may receive copies of available information for a reasonable fee.  I understand that some of the potential risks of receiving the Services via telemedicine include:  Marland Kitchen Delay or interruption in medical evaluation due to technological equipment failure or disruption; . Information transmitted may not be sufficient (e.g. poor resolution of images) to allow for appropriate medical decision making by the Practitioner; and/or  . In rare instances, security protocols could fail, causing a breach of personal health information.  Furthermore, I acknowledge that it is my responsibility to provide information about my medical history, conditions and care that is complete and accurate to the best of my ability. I acknowledge that Practitioner's advice, recommendations, and/or decision may be based on factors not within their control, such as incomplete or inaccurate data provided by me or distortions of diagnostic images or specimens that may result from electronic transmissions. I understand that the practice  of medicine is not an Chief Strategy Officer and that Practitioner makes no warranties or guarantees regarding treatment outcomes. I acknowledge that I will receive a copy of this consent concurrently upon execution via email to the email address I last provided but may also request a printed copy by calling the office of [facility].    I understand that my insurance will be billed for this visit.   I have read or had this consent read to me. . I understand the contents of this consent, which adequately explains the benefits and risks of the Services being provided via telemedicine.  . I have been provided ample opportunity to ask questions regarding this consent and the Services and have had my questions answered to  my satisfaction. . I give my informed consent for the services to be provided through the use of telemedicine in my medical care  By participating in this telemedicine visit I agree to the above.

## 2019-02-09 NOTE — Patient Instructions (Addendum)
Medication Instructions:   Your physician recommends that you continue on your current medications as directed. Please refer to the Current Medication list given to you today.  If you need a refill on your cardiac medications before your next appointment, please call your pharmacy.   Lab work:  You will need to have labs (blood work) drawn by Tuesday 5/12: CBC  If you have labs (blood work) drawn today and your tests are completely normal, you will receive your results only by: Marland Kitchen MyChart Message (if you have MyChart) OR . A paper copy in the mail If you have any lab test that is abnormal or we need to change your treatment, we will call you to review the results.  Testing/Procedures: Your physician has requested that you have a cardiac catheterization. Cardiac catheterization is used to diagnose and/or treat various heart conditions. Doctors may recommend this procedure for a number of different reasons. The most common reason is to evaluate chest pain. Chest pain can be a symptom of coronary artery disease (CAD), and cardiac catheterization can show whether plaque is narrowing or blocking your heart's arteries. This procedure is also used to evaluate the valves, as well as measure the blood flow and oxygen levels in different parts of your heart. For further information please visit [email or web address].[email or web address]. Please follow instruction sheet, as given.    Follow-Up: At [facility], you and your health needs are our priority.  As part of our continuing mission to provide you with exceptional heart care, we have created designated Provider Care Teams.  These Care Teams include your primary Cardiologist (physician) and Advanced Practice Providers (APPs -  Physician Assistants and Nurse Practitioners) who all work together to provide you with the care you need, when you need it. You will need a follow up appointment in 2 weeks.  Please call our office 2 months in advance to schedule this appointment.   You may see Shelva Majestic, MD or one of the following Advanced Practice Providers on your designated Care Team: [place], [place] . Fabian Sharp, PA-C  Any Other Special Instructions Will Be Listed Below (If Applicable).              [place] [address] [place] [address] Dept: [phone] Loc: [phone]  Sumedh Shinsato  02/09/2019  You are scheduled for a Cardiac Catheterization on Friday, May 15 with Dr. Shelva Majestic.  1. Please arrive at the [facility] (Main Entrance A) at [facility]: [address] [place], [place] [address] at 5:30 AM (This time is two hours before your procedure to ensure your preparation). Free valet parking service is available.   Special note: Every effort is made to have your procedure done on time. Please understand that emergencies sometimes delay scheduled procedures.  2. Diet: Do not eat solid foods after midnight.  The patient may have clear liquids until 5am upon the day of the procedure.  3. Labs: You will need to have blood drawn on Tuesday, May 12 at [facility]  Open: 8am - 5pm (Lunch 12:30 - 1:30)   Phone: [phone]. You do not need to be fasting.  4. Medication instructions in preparation for your procedure:   Contrast Allergy: No  *For reference purposes while preparing patient instructions.   Delete this med list prior to printing instructions for patient.*   Hold your Cialis for 48 hours prior to procedure  On the morning of your procedure, take your Aspirin and any morning medicines NOT listed above.  You may use sips of water.  5. Plan for one night stay--bring personal belongings. 6. Bring a current list of your medications and current insurance cards. 7. You MUST have a responsible person to drive you home. 8. Someone MUST be with you the first 24 hours after you arrive  home or your discharge will be delayed. 9. Please wear clothes that are easy to get on and off and wear slip-on shoes.  Thank you for allowing [place] to care for you!   -- [facility]

## 2019-02-09 NOTE — Progress Notes (Signed)
Virtual Visit via Telephone Note   This visit type was conducted due to national recommendations for restrictions regarding the COVID-19 Pandemic (e.g. social distancing) in an effort to limit this patient's exposure and mitigate transmission in our community.  Due to his co-morbid illnesses, this patient is at least at moderate risk for complications without adequate follow up.  This format is felt to be most appropriate for this patient at this time.  The patient did not have access to video technology/had technical difficulties with video requiring transitioning to audio format only (telephone).  All issues noted in this document were discussed and addressed.  No physical exam could be performed with this format.  Please refer to the patient's chart for his  consent to telehealth for [facility].   Date:  02/11/2019   ID:  Gregory Suarez, DOB 02-16-1946, MRN [id number]  Patient Location: Home Provider Location: Home  PCP:  [name], MD  Cardiologist:  Shelva Majestic, MD  Electrophysiologist:  None   Evaluation Performed:  Follow-Up Visit  Chief Complaint:  followup  History of Present Illness:    Gregory Suarez is a 73 y.o. male with past medical history of CAD, GERD, hypertension, hyperlipidemia, mitral valve regurgitation, obstructive sleep apnea on BiPAP, and restless leg syndrome.  He had a cardiac catheterization 20 years ago and was told having mild blockages, therefore medical therapy was recommended.  Echocardiogram in December 2017 showed EF 55 to 60%.  Fairly recent chest CT revealed emphysema, aortic atherosclerosis and extensive coronary calcification.  He was last seen by Dr. Claiborne Billings on 11/12/2018, at which time he was doing well.  Aspirin was switched to 81 mg.  A coronary CT was obtained on 02/04/2019 which revealed a coronary calcium score of 2264 which placed the patient in the 34 percentile for age and sex matched control, right dominated artery system with severe  stenosis in the proximal LAD and ostial portion of D1.  FFR revealed D1 at 0.62, proximal LAD at 0.83, mid LAD at 0.57.  A cardiac catheterization was recommended.  The noncardiac portion showed increased prominence in the size of the subcarinal and left hilar lymph nodes is nonspecific and may be reactive given severe underlying emphysema however a lymphoproliferative process cannot be excluded.  Follow-up was recommended.  Patient was contacted today via telephone visit.  He says sometimes when the weather is cold, he will have chest tightness.  He has not experienced any exertional chest pain.  We discussed his coronary CT result, at this time, we will set up his cardiac catheterization with Dr. Claiborne Billings.  Risk and benefit of the procedure has been discussed with the patient who is agreeable to proceed.  The patient does not have symptoms concerning for COVID-19 infection (fever, chills, cough, or new shortness of breath).    Past Medical History:  Diagnosis Date  . CAD (coronary artery disease)   . Elevated PSA   . Erectile dysfunction   . GERD (gastroesophageal reflux disease)   . Hyperlipidemia   . Hypertension   . Mitral valve regurgitation   . OSA treated with BiPAP   . RLS (restless legs syndrome)   . Tubular adenoma of colon    Past Surgical History:  Procedure Laterality Date  . ANKLE SURGERY    . CHOLECYSTECTOMY    . CHOLECYSTECTOMY       Current Meds  Medication Sig  . aspirin EC 81 MG tablet Take 1 tablet (81 mg total) by mouth daily.  Marland Kitchen  atorvastatin (LIPITOR) 40 MG tablet Take 1 tablet (40 mg total) by mouth daily.  . benzonatate (TESSALON) 100 MG capsule Take 1 capsule (100 mg total) by mouth 3 (three) times daily as needed for cough.  . finasteride (PROSCAR) 5 MG tablet Take 1 tablet (5 mg total) by mouth daily.  . fluticasone (FLONASE) 50 MCG/ACT nasal spray Place 2 sprays into both nostrils daily. (Patient taking differently: Place 2 sprays into both nostrils daily as  needed for allergies. )  . metoprolol tartrate (LOPRESSOR) 25 MG tablet Take 1 tablet (25 mg total) by mouth 2 (two) times daily.  . pantoprazole (PROTONIX) 40 MG tablet Take 1 tablet (40 mg total) by mouth 2 (two) times daily.  . pramipexole (MIRAPEX) 0.25 MG tablet TAKE 2 TABLETS BY MOUTH EVERY DAY AT BEDTIME (Patient taking differently: Take 0.5 mg by mouth at bedtime. TAKE 2 TABLETS BY MOUTH EVERY DAY AT BEDTIME)  . tadalafil (CIALIS) 5 MG tablet Take 1 tablet (5 mg total) by mouth daily.  Marland Kitchen tiZANidine (ZANAFLEX) 4 MG tablet Take 1 tablet (4 mg total) by mouth every 8 (eight) hours as needed for muscle spasms.  . [DISCONTINUED] Probiotic Product (PROBIOTIC FORMULA PO) Take 1 tablet by mouth daily.     Allergies:   Patient has no known allergies.   Social History   Tobacco Use  . Smoking status: Former Smoker    Packs/day: 0.50    Years: 15.00    Pack years: 7.50    Types: Cigarettes    Last attempt to quit: 10/01/1988    Years since quitting: 30.3  . Smokeless tobacco: Never Used  Substance Use Topics  . Alcohol use: No    Alcohol/week: 0.0 standard drinks  . Drug use: No     Family Hx: The patient's family history includes Breast cancer in an other family member; Coronary artery disease in an other family member; Diabetes in an other family member.  ROS:   Please see the history of present illness.     All other systems reviewed and are negative.   Prior CV studies:   The following studies were reviewed today:  Coronary CT 02/04/2019 IMPRESSION: 1. Coronary calcium score of 2264. This was 53 percentile for age and sex matched control.  2. Normal coronary origin with right dominance.  3. Severe stenosis in the proximal LAD and ostial portion of the 1. diagonal artery. Moderate CAD in the distal left main, proximal RCA and proximal LCX artery. Additional analysis with CT FFR will be Submitted.  1. Left Main:  No significant stenosis.  2. LAD: Proximal: 0.83,  mid 0.57. 3. D1: 0.62. 4. LCX: No significant stenosis. 5. RCA: No significant stenosis.  IMPRESSION: 1. CT FFR analysis didn't showed critical stenosis in the proximal LAD and ostial portion of the first diagonal artery. A cardiac catheterization is recommended.  Labs/Other Tests and Data Reviewed:    EKG:  An ECG dated 11/12/2018 was personally reviewed today and demonstrated:  Sinus bradycardia.  Recent Labs: 10/31/2018: ALT 18 02/02/2019: BUN 18; Creatinine, Ser 0.90; Potassium 3.7; Sodium 142 02/10/2019: Hemoglobin 14.1; Platelets 186   Recent Lipid Panel Lab Results  Component Value Date/Time   CHOL 105 07/17/2018 08:18 AM   TRIG 102 07/17/2018 08:18 AM   HDL 37 (L) 07/17/2018 08:18 AM   CHOLHDL 2.8 07/17/2018 08:18 AM   LDLCALC 49 07/17/2018 08:18 AM    Wt Readings from Last 3 Encounters:  11/12/18 256 lb 12.8 oz (116.5 kg)  11/11/18 253 lb (114.8 kg)  10/30/18 264 lb 4 oz (119.9 kg)     Objective:    Vital Signs:  There were no vitals taken for this visit.   VITAL SIGNS:  reviewed  Despite our best effort, no vital sign was available during this visit.  ASSESSMENT & PLAN:    1. Abnormal coronary CT: Plan to proceed with cardiac catheterization given chest tightness and abnormal coronary CT. Risk and benefit of procedure explained to the patient who display clear understanding and agree to proceed. Discussed with patient possible procedural risk include bleeding, vascular injury, renal injury, arrythmia, MI, stroke and loss of limb or life.  2. Hypertension: Continue metoprolol.  3. Hyperlipidemia: On Lipitor  4. Mitral valve regurgitation: Trivial regurgitation noted on previous echocardiogram in December 2017  5. Obstructive sleep apnea: on BiPAP   COVID-19 Education: The signs and symptoms of COVID-19 were discussed with the patient and how to seek care for testing (follow up with PCP or arrange E-visit).  The importance of social distancing was discussed  today.  Time:   Today, I have spent 24 minutes with the patient with telehealth technology discussing the above problems.     Medication Adjustments/Labs and Tests Ordered: Current medicines are reviewed at length with the patient today.  Concerns regarding medicines are outlined above.   Tests Ordered: Orders Placed This Encounter  Procedures  . Novel Coronavirus, NAA ([facility])  . CBC    Medication Changes: No orders of the defined types were placed in this encounter.   Disposition:  Follow up in 3 week(s)  Signed, Almyra Deforest, PA  02/11/2019 9:56 AM    [facility]

## 2019-02-09 NOTE — Telephone Encounter (Signed)
    COVID-19 Pre-Screening Questions:  . How are you feeling today, any cough,  shortness of breath, headache, congestion, fever, body aches, chills, sore throat, or sudden loss of taste or sense of smell? NO history of COPD . Have you been around anyone with known Covid 19? NO . Have you been around anyone who is awaiting Covid 19 test results? NO . Have you been around anyone who has been exposed to Covid 19, or has mentioned symptoms of Covid 19 within the past 14 days,? NO  If you have any concerns about symptoms your patients report please contact your leadership team, or the provider the patient is seeing in the office for further guidance.

## 2019-02-10 ENCOUNTER — Other Ambulatory Visit (HOSPITAL_COMMUNITY): Payer: Medicare Other

## 2019-02-10 ENCOUNTER — Inpatient Hospital Stay (HOSPITAL_COMMUNITY): Admission: RE | Admit: 2019-02-10 | Payer: Medicare Other | Source: Ambulatory Visit

## 2019-02-10 ENCOUNTER — Other Ambulatory Visit (HOSPITAL_COMMUNITY)
Admission: RE | Admit: 2019-02-10 | Discharge: 2019-02-10 | Disposition: A | Discharge status: Home / Self Care | Payer: Medicare Other | Source: Ambulatory Visit | Attending: Cardiovascular Disease | Admitting: Cardiovascular Disease

## 2019-02-10 ENCOUNTER — Other Ambulatory Visit: Payer: Self-pay

## 2019-02-10 DIAGNOSIS — Z1159 Encounter for screening for other viral diseases: Secondary | ICD-10-CM | POA: Insufficient documentation

## 2019-02-10 DIAGNOSIS — I2510 Atherosclerotic heart disease of native coronary artery without angina pectoris: Secondary | ICD-10-CM | POA: Diagnosis not present

## 2019-02-10 DIAGNOSIS — I10 Essential (primary) hypertension: Secondary | ICD-10-CM | POA: Diagnosis not present

## 2019-02-10 DIAGNOSIS — Z01812 Encounter for preprocedural laboratory examination: Secondary | ICD-10-CM | POA: Diagnosis not present

## 2019-02-10 LAB — CBC WITH DIFFERENTIAL/PLATELET
Basophils Absolute: 0 10*3/uL (ref 0.0–0.2)
Basos: 1 %
EOS (ABSOLUTE): 0.2 10*3/uL (ref 0.0–0.4)
Eos: 3 %
Hematocrit: 40.6 % (ref 37.5–51.0)
Hemoglobin: 14.1 g/dL (ref 13.0–17.7)
Immature Grans (Abs): 0 10*3/uL (ref 0.0–0.1)
Immature Granulocytes: 0 %
Lymphocytes Absolute: 0.9 10*3/uL (ref 0.7–3.1)
Lymphs: 16 %
MCH: 31.1 pg (ref 26.6–33.0)
MCHC: 34.7 g/dL (ref 31.5–35.7)
MCV: 89 fL (ref 79–97)
Monocytes Absolute: 0.6 10*3/uL (ref 0.1–0.9)
Monocytes: 10 %
Neutrophils Absolute: 4.1 10*3/uL (ref 1.4–7.0)
Neutrophils: 70 %
Platelets: 186 10*3/uL (ref 150–450)
RBC: 4.54 x10E6/uL (ref 4.14–5.80)
RDW: 13.8 % (ref 11.6–15.4)
WBC: 5.7 10*3/uL (ref 3.4–10.8)

## 2019-02-11 ENCOUNTER — Other Ambulatory Visit: Payer: Self-pay | Admitting: Physician Assistant

## 2019-02-11 LAB — NOVEL CORONAVIRUS, NAA (HOSP ORDER, SEND-OUT TO REF LAB; TAT 18-24 HRS): SARS-CoV-2, NAA: NOT DETECTED

## 2019-02-11 MED ORDER — SODIUM CHLORIDE 0.9% FLUSH: 3.0000 mL | Freq: Two times a day (BID) | INTRAVENOUS | Status: DC

## 2019-02-12 ENCOUNTER — Telehealth: Payer: Self-pay | Admitting: *Deleted

## 2019-02-12 NOTE — Telephone Encounter (Signed)
Pt contacted pre-catheterization scheduled at [facility] for: Friday Feb 13, 2019 7:30 AM Verified arrival time and place: [place] Entrance A at: 5:30 AM Covid-19 02/10/19 none detected  No solid food after midnight prior to cath, clear liquids until 5 AM day of procedure. Contrast allergy: no  AM meds can be  taken pre-cath with sip of water including: ASA 81 mg  Confirmed patient has responsible person to drive home post procedure and observe 24 hours after arriving home: yes   Pt advised due to Covid-19 pandemic, [facility] is restricting visitors and only patients should present for check-in prior to their procedure. People will not be allowed to enter [facility] with the patient. At this time [facility] is not allowing visitors to all [facility] campuses.    Per Dr Leanne Chang Daily Covid Update 02/10/19: Universal masks: Effective immediately, we are requiring all ED patients and all ambulatory care patients to wear a universal mask, which we will provide. In-patients will have a mask at their bedside and will only be asked to put it on when a caregiver comes into their room.       COVID-19 Pre-Screening Questions:  . In the past 7 to 10 days have you had a cough,  shortness of breath, headache, congestion, fever, body aches, chills, sore throat, or sudden loss of taste or sense of smell? Cough-? From reflux . Have you been around anyone with known Covid 19? no . Have you been around anyone who is awaiting Covid 19 test results in the past 7 to 10 days? no . Have you been around anyone who has been exposed to Covid 19, or has mentioned symptoms of Covid 19 within the past 7 to 10 days? no

## 2019-02-12 NOTE — Telephone Encounter (Signed)
The patient has been notified of the result and verbalized understanding.  All questions (if any) were answered. Jacqulynn Cadet, CMA 02/12/2019 1:22 PM

## 2019-02-13 ENCOUNTER — Ambulatory Visit (HOSPITAL_COMMUNITY)
Admission: RE | Admit: 2019-02-13 | Discharge: 2019-02-13 | Disposition: A | Discharge status: Home / Self Care | Payer: Medicare Other | Attending: Cardiovascular Disease | Admitting: Cardiovascular Disease

## 2019-02-13 ENCOUNTER — Encounter (HOSPITAL_COMMUNITY)
Admission: RE | Disposition: A | Discharge status: Home / Self Care | Payer: Self-pay | Source: Home / Self Care | Attending: Cardiovascular Disease

## 2019-02-13 ENCOUNTER — Other Ambulatory Visit: Payer: Self-pay

## 2019-02-13 DIAGNOSIS — Z8249 Family history of ischemic heart disease and other diseases of the circulatory system: Secondary | ICD-10-CM | POA: Diagnosis not present

## 2019-02-13 DIAGNOSIS — Z87891 Personal history of nicotine dependence: Secondary | ICD-10-CM | POA: Insufficient documentation

## 2019-02-13 DIAGNOSIS — K219 Gastro-esophageal reflux disease without esophagitis: Secondary | ICD-10-CM | POA: Diagnosis not present

## 2019-02-13 DIAGNOSIS — Z7982 Long term (current) use of aspirin: Secondary | ICD-10-CM | POA: Diagnosis not present

## 2019-02-13 DIAGNOSIS — G2581 Restless legs syndrome: Secondary | ICD-10-CM | POA: Insufficient documentation

## 2019-02-13 DIAGNOSIS — I10 Essential (primary) hypertension: Secondary | ICD-10-CM | POA: Insufficient documentation

## 2019-02-13 DIAGNOSIS — R931 Abnormal findings on diagnostic imaging of heart and coronary circulation: Secondary | ICD-10-CM | POA: Insufficient documentation

## 2019-02-13 DIAGNOSIS — E785 Hyperlipidemia, unspecified: Secondary | ICD-10-CM | POA: Diagnosis not present

## 2019-02-13 DIAGNOSIS — Z79899 Other long term (current) drug therapy: Secondary | ICD-10-CM | POA: Diagnosis not present

## 2019-02-13 DIAGNOSIS — I2510 Atherosclerotic heart disease of native coronary artery without angina pectoris: Secondary | ICD-10-CM

## 2019-02-13 DIAGNOSIS — N529 Male erectile dysfunction, unspecified: Secondary | ICD-10-CM | POA: Insufficient documentation

## 2019-02-13 DIAGNOSIS — I2584 Coronary atherosclerosis due to calcified coronary lesion: Secondary | ICD-10-CM | POA: Insufficient documentation

## 2019-02-13 DIAGNOSIS — Z0000 Encounter for general adult medical examination without abnormal findings: Secondary | ICD-10-CM

## 2019-02-13 DIAGNOSIS — G4733 Obstructive sleep apnea (adult) (pediatric): Secondary | ICD-10-CM | POA: Diagnosis not present

## 2019-02-13 HISTORY — PX: LEFT HEART CATH AND CORONARY ANGIOGRAPHY: CATH118249

## 2019-02-13 LAB — BASIC METABOLIC PANEL
Anion gap: 8 (ref 5–15)
BUN: 17 mg/dL (ref 8–23)
CO2: 23 mmol/L (ref 22–32)
Calcium: 8.4 mg/dL — ABNORMAL LOW (ref 8.9–10.3)
Chloride: 108 mmol/L (ref 98–111)
Creatinine, Ser: 0.91 mg/dL (ref 0.61–1.24)
GFR calc Af Amer: >60 mL/min (ref >60)
GFR calc non Af Amer: >60 mL/min (ref >60)
Glucose, Bld: 87 mg/dL (ref 70–99)
Potassium: 3.5 mmol/L (ref 3.5–5.1)
Sodium: 139 mmol/L (ref 135–145)

## 2019-02-13 SURGERY — LEFT HEART CATH AND CORONARY ANGIOGRAPHY
Anesthesia: LOCAL

## 2019-02-13 MED ORDER — LIDOCAINE HCL (PF) 1 % IJ SOLN
INTRAMUSCULAR | Status: DC | PRN
  Administered 2019-02-13: 3 mL

## 2019-02-13 MED ORDER — VERAPAMIL HCL 2.5 MG/ML IV SOLN
INTRAVENOUS | Status: DC | PRN
  Administered 2019-02-13: 10 mL via INTRA_ARTERIAL

## 2019-02-13 MED ORDER — SODIUM CHLORIDE 0.9 % IV SOLN: INTRAVENOUS | Status: DC

## 2019-02-13 MED ORDER — FENTANYL CITRATE (PF) 100 MCG/2ML IJ SOLN
INTRAMUSCULAR | Status: DC | PRN
  Administered 2019-02-13: 25 ug via INTRAVENOUS

## 2019-02-13 MED ORDER — LIDOCAINE HCL (PF) 1 % IJ SOLN
INTRAMUSCULAR | Status: AC
  Filled 2019-02-13: qty 30

## 2019-02-13 MED ORDER — IOHEXOL 350 MG/ML SOLN
INTRAVENOUS | Status: DC | PRN
  Administered 2019-02-13: 100 mL via INTRA_ARTERIAL

## 2019-02-13 MED ORDER — ACETAMINOPHEN 325 MG PO TABS: 650.0000 mg | ORAL_TABLET | ORAL | Status: DC | PRN

## 2019-02-13 MED ORDER — SODIUM CHLORIDE 0.9 % IV SOLN: 250.0000 mL | INTRAVENOUS | Status: DC | PRN

## 2019-02-13 MED ORDER — HYDRALAZINE HCL 20 MG/ML IJ SOLN: 10.0000 mg | INTRAMUSCULAR | Status: DC | PRN

## 2019-02-13 MED ORDER — HEPARIN (PORCINE) IN NACL 1000-0.9 UT/500ML-% IV SOLN
INTRAVENOUS | Status: DC | PRN
  Administered 2019-02-13: 500 mL

## 2019-02-13 MED ORDER — MIDAZOLAM HCL 2 MG/2ML IJ SOLN
INTRAMUSCULAR | Status: AC
  Filled 2019-02-13: qty 2

## 2019-02-13 MED ORDER — VERAPAMIL HCL 2.5 MG/ML IV SOLN
INTRAVENOUS | Status: AC
  Filled 2019-02-13: qty 2

## 2019-02-13 MED ORDER — ASPIRIN 81 MG PO CHEW: 81.0000 mg | CHEWABLE_TABLET | Freq: Every day | ORAL | Status: DC

## 2019-02-13 MED ORDER — SODIUM CHLORIDE 0.9 % WEIGHT BASED INFUSION: 1.0000 mL/kg/h | INTRAVENOUS | Status: DC

## 2019-02-13 MED ORDER — MIDAZOLAM HCL 2 MG/2ML IJ SOLN
INTRAMUSCULAR | Status: DC | PRN
  Administered 2019-02-13: 2 mg via INTRAVENOUS

## 2019-02-13 MED ORDER — SODIUM CHLORIDE 0.9 % WEIGHT BASED INFUSION
3.0000 mL/kg/h | INTRAVENOUS | Status: AC
  Administered 2019-02-13: 3 mL/kg/h via INTRAVENOUS

## 2019-02-13 MED ORDER — HEPARIN SODIUM (PORCINE) 1000 UNIT/ML IJ SOLN
INTRAMUSCULAR | Status: DC | PRN
  Administered 2019-02-13: 5000 [IU] via INTRAVENOUS

## 2019-02-13 MED ORDER — ASPIRIN 81 MG PO CHEW: 81.0000 mg | CHEWABLE_TABLET | ORAL | Status: DC

## 2019-02-13 MED ORDER — ONDANSETRON HCL 4 MG/2ML IJ SOLN: 4.0000 mg | Freq: Four times a day (QID) | INTRAMUSCULAR | Status: DC | PRN

## 2019-02-13 MED ORDER — FENTANYL CITRATE (PF) 100 MCG/2ML IJ SOLN
INTRAMUSCULAR | Status: AC
  Filled 2019-02-13: qty 2

## 2019-02-13 MED ORDER — SODIUM CHLORIDE 0.9% FLUSH: 3.0000 mL | INTRAVENOUS | Status: DC | PRN

## 2019-02-13 MED ORDER — AMLODIPINE BESYLATE 5 MG PO TABS: 5.0000 mg | ORAL_TABLET | Freq: Every day | ORAL | 11 refills | Status: DC

## 2019-02-13 MED ORDER — HEPARIN (PORCINE) IN NACL 1000-0.9 UT/500ML-% IV SOLN
INTRAVENOUS | Status: AC
  Filled 2019-02-13: qty 1000

## 2019-02-13 MED ORDER — ATORVASTATIN CALCIUM 80 MG PO TABS
80.0000 mg | ORAL_TABLET | Freq: Every day | ORAL | Status: DC
  Filled 2019-02-13: qty 1

## 2019-02-13 MED ORDER — LABETALOL HCL 5 MG/ML IV SOLN: 10.0000 mg | INTRAVENOUS | Status: DC | PRN

## 2019-02-13 MED ORDER — SODIUM CHLORIDE 0.9% FLUSH: 3.0000 mL | Freq: Two times a day (BID) | INTRAVENOUS | Status: DC

## 2019-02-13 MED ORDER — ATORVASTATIN CALCIUM 80 MG PO TABS: 80.0000 mg | ORAL_TABLET | Freq: Every day | ORAL | 3 refills | Status: DC

## 2019-02-13 SURGICAL SUPPLY — 12 items
CATH INFINITI 5FR ANG PIGTAIL (CATHETERS) ×1 IMPLANT
CATH OPTITORQUE TIG 4.0 5F (CATHETERS) ×1 IMPLANT
COVER DOME SNAP 22 D (MISCELLANEOUS) ×1 IMPLANT
DEVICE RAD COMP TR BAND LRG (VASCULAR PRODUCTS) ×1 IMPLANT
GLIDESHEATH SLEND SS 6F .021 (SHEATH) ×1 IMPLANT
GUIDEWIRE INQWIRE 1.5J.035X260 (WIRE) IMPLANT
INQWIRE 1.5J .035X260CM (WIRE) ×2
KIT HEART LEFT (KITS) ×2 IMPLANT
PACK CARDIAC CATHETERIZATION (CUSTOM PROCEDURE TRAY) ×2 IMPLANT
SYR MEDRAD MARK 7 150ML (SYRINGE) ×2 IMPLANT
TRANSDUCER W/STOPCOCK (MISCELLANEOUS) ×2 IMPLANT
TUBING CIL FLEX 10 FLL-RA (TUBING) ×2 IMPLANT

## 2019-02-13 NOTE — Discharge Instructions (Signed)
Radial Site Care ° °This sheet gives you information about how to care for yourself after your procedure. Your health care provider may also give you more specific instructions. If you have problems or questions, contact your health care provider. °What can I expect after the procedure? °After the procedure, it is common to have: °· Bruising and tenderness at the catheter insertion area. °Follow these instructions at home: °Medicines °· Take over-the-counter and prescription medicines only as told by your health care provider. °Insertion site care °· Follow instructions from your health care provider about how to take care of your insertion site. Make sure you: °? Wash your hands with soap and water before you change your bandage (dressing). If soap and water are not available, use hand sanitizer. °? Change your dressing as told by your health care provider. °? Leave stitches (sutures), skin glue, or adhesive strips in place. These skin closures may need to stay in place for 2 weeks or longer. If adhesive strip edges start to loosen and curl up, you may trim the loose edges. Do not remove adhesive strips completely unless your health care provider tells you to do that. °· Check your insertion site every day for signs of infection. Check for: °? Redness, swelling, or pain. °? Fluid or blood. °? Pus or a bad smell. °? Warmth. °· Do not take baths, swim, or use a hot tub until your health care provider approves. °· You may shower 24-48 hours after the procedure, or as directed by your health care provider. °? Remove the dressing and gently wash the site with plain soap and water. °? Pat the area dry with a clean towel. °? Do not rub the site. That could cause bleeding. °· Do not apply powder or lotion to the site. °Activity ° °· For 24 hours after the procedure, or as directed by your health care provider: °? Do not flex or bend the affected arm. °? Do not push or pull heavy objects with the affected arm. °? Do not  drive yourself home from the hospital or clinic. You may drive 24 hours after the procedure unless your health care provider tells you not to. °? Do not operate machinery or power tools. °· Do not lift anything that is heavier than 10 lb (4.5 kg), or the limit that you are told, until your health care provider says that it is safe. °· Ask your health care provider when it is okay to: °? Return to work or school. °? Resume usual physical activities or sports. °? Resume sexual activity. °General instructions °· If the catheter site starts to bleed, raise your arm and put firm pressure on the site. If the bleeding does not stop, get help right away. This is a medical emergency. °· If you went home on the same day as your procedure, a responsible adult should be with you for the first 24 hours after you arrive home. °· Keep all follow-up visits as told by your health care provider. This is important. °Contact a health care provider if: °· You have a fever. °· You have redness, swelling, or yellow drainage around your insertion site. °Get help right away if: °· You have unusual pain at the radial site. °· The catheter insertion area swells very fast. °· The insertion area is bleeding, and the bleeding does not stop when you hold steady pressure on the area. °· Your arm or hand becomes pale, cool, tingly, or numb. °These symptoms may represent a serious problem   that is an emergency. Do not wait to see if the symptoms will go away. Get medical help right away. Call your local emergency services (911 in the [place] Do not drive yourself to the hospital. °Summary °· After the procedure, it is common to have bruising and tenderness at the site. °· Follow instructions from your health care provider about how to take care of your radial site wound. Check the wound every day for signs of infection. °· Do not lift anything that is heavier than 10 lb (4.5 kg), or the limit that you are told, until your health care provider says  that it is safe. °This information is not intended to replace advice given to you by your health care provider. Make sure you discuss any questions you have with your health care provider. °Document Released: 10/20/2010 Document Revised: 10/23/2017 Document Reviewed: 10/23/2017 °Elsevier Interactive Patient Education © 2019 [employer] ° °

## 2019-02-16 ENCOUNTER — Telehealth: Payer: Self-pay | Admitting: Cardiovascular Disease

## 2019-02-16 ENCOUNTER — Encounter (HOSPITAL_COMMUNITY): Payer: Self-pay | Admitting: Cardiovascular Disease

## 2019-02-16 NOTE — Telephone Encounter (Signed)
Spoke with pt who state he was never told that he was supposed to be taking amlodipine 5 mg. Nurse reviewed discharged instruction and it's not list but informed pt that per Dr. Evette Georges cath notes, he wanted pt to start the mediation  Pt also questioning as to how long he is to follow the 10 lb weight restriction. Per Dr. Ellyn Hack (DOD), pt informed that it's usually 5 day. Pt voiced understanding.

## 2019-02-16 NOTE — Telephone Encounter (Signed)
New Message   Pt c/o medication issue:  1. Name of Medication: amLODipine (NORVASC) 5 MG tablet    2. How are you currently taking this medication (dosage and times per day)?   3. Are you having a reaction (difficulty breathing--STAT)?   4. What is your medication issue? Patient is calling because he had a heart cath but it was never mentioned to him that he should start this medication but there was an rx sent for this. Please call to discuss.

## 2019-02-26 ENCOUNTER — Telehealth: Payer: Self-pay | Admitting: Cardiovascular Disease

## 2019-02-26 NOTE — Telephone Encounter (Signed)
home phone/ my chart declined/ consent/ pre reg completed

## 2019-03-02 ENCOUNTER — Telehealth (INDEPENDENT_AMBULATORY_CARE_PROVIDER_SITE_OTHER): Payer: Medicare Other | Admitting: Cardiovascular Disease

## 2019-03-02 VITALS — Ht 75.0 in | Wt 238.0 lb

## 2019-03-02 DIAGNOSIS — I2510 Atherosclerotic heart disease of native coronary artery without angina pectoris: Secondary | ICD-10-CM

## 2019-03-02 DIAGNOSIS — I10 Essential (primary) hypertension: Secondary | ICD-10-CM

## 2019-03-02 DIAGNOSIS — E785 Hyperlipidemia, unspecified: Secondary | ICD-10-CM | POA: Diagnosis not present

## 2019-03-02 DIAGNOSIS — R931 Abnormal findings on diagnostic imaging of heart and coronary circulation: Secondary | ICD-10-CM

## 2019-03-02 DIAGNOSIS — G4733 Obstructive sleep apnea (adult) (pediatric): Secondary | ICD-10-CM

## 2019-03-02 NOTE — Progress Notes (Signed)
Virtual Visit via Telephone Note   This visit type was conducted due to national recommendations for restrictions regarding the COVID-19 Pandemic (e.g. social distancing) in an effort to limit this patient's exposure and mitigate transmission in our community.  Due to his co-morbid illnesses, this patient is at least at moderate risk for complications without adequate follow up.  This format is felt to be most appropriate for this patient at this time.  The patient did not have access to video technology/had technical difficulties with video requiring transitioning to audio format only (telephone).  All issues noted in this document were discussed and addressed.  No physical exam could be performed with this format.  Please refer to the patient's chart for his  consent to telehealth for [facility].   Date:  03/02/2019   ID:  Gregory Suarez, DOB 19-Jun-1946, MRN [id number]  Patient Location: Home Provider Location: Office  PCP:  [name], MD  Cardiologist:  Shelva Majestic, MD  Electrophysiologist:  None   Evaluation Performed:  Follow-Up Visit  Chief Complaint: Follow-up of recent cardiac catheterization.  History of Present Illness:    Gregory Suarez is a 73 y.o. male who is a former patient of Dr. Joni Fears.  He underwent an initial heart catheterization over 20 ago and was told of having mild blockage which medical therapy was recommended. He has a long history of sleep apnea and initially was followed by Dr. Danton Sewer.  He on his third CPAP machine.  He has a istory of significant obesity when I last saw him in 2017 he had lost over 50 pounds   He denied any exertional chest pain but admits to intermittent left sharp, achy discomfort which is nonexertional.  He denied associated palpitations or shortness of breath.  He admitted to leg swelling and wore an ankle brace..  When I last saw him, he was stable from a cardiac standpoint.  He underwent an echo Doppler study in December  2017 which showed an EF of 55 to 60%.  Over the past several years, he had done well with reference to chest pain or palpitations.  I last saw him in February 2020.  He had undergone a recent chest CT which showed emphysema, aortic atherosclerosis, as well as extensive coronary calcifications.  He has not been using his CPAP therapy and I obtained a download  in the office which showed only 2 days of use over the last 90 days.  He also has a history of restless leg syndrome.  He typically goes to bed between 9 and 11 PM and wakes up between 7 and 9 AM.  He had see Dr. [name] and because of his abnormal CT scan was sent to me in February for follow-up evaluation.  At that time, in light of his significant coronary calcification I recommended that he undergo a coronary CT angiogram.  This was significantly abnormal and revealed a markedly elevated calcium score of 2264 with diffuse coronary calcification and stenoses in the proximal LAD, diagonal, circumflex marginal, and RCA.    As a result he was referred for cardiac catheterization which I performed on Feb 13 2019.  He was found to have diffuse multivessel coronary calcification with 50 to 60% proximal LAD stenoses, diffuse 50% stenosis in the first diagonal branch of the LAD with mild irregularity in the mid distal LAD without high-grade stenosis.  Left circumflex vessel had smooth 60% mid distal OM stenosis in the RCA had mild irregularity with proximal to mid  calcification and a dominant RCA with stenosis of 20%.  He had preserved LV contractility with an EF at 55%.  Medical therapy was recommended.  He had been started on low-dose beta-blocker therapy prior to the catheterization and subsequent to the procedure I recommended initiation of amlodipine 5 mg and further titration of atorvastatin to 80 mg.  He presents for follow-up evaluation.  The patient does not have symptoms concerning for COVID-19 infection (fever, chills, cough, or new shortness of  breath).    Past Medical History:  Diagnosis Date  . CAD (coronary artery disease)   . Elevated PSA   . Erectile dysfunction   . GERD (gastroesophageal reflux disease)   . Hyperlipidemia   . Hypertension   . Mitral valve regurgitation   . OSA treated with BiPAP   . RLS (restless legs syndrome)   . Tubular adenoma of colon    Past Surgical History:  Procedure Laterality Date  . ANKLE SURGERY    . CHOLECYSTECTOMY    . CHOLECYSTECTOMY    . LEFT HEART CATH AND CORONARY ANGIOGRAPHY N/A 02/13/2019   Procedure: LEFT HEART CATH AND CORONARY ANGIOGRAPHY;  Surgeon: Troy Sine, MD;  Location: [facility];  Service: Cardiovascular;  Laterality: N/A;     Current Meds  Medication Sig  . amLODipine (NORVASC) 5 MG tablet Take 1 tablet (5 mg total) by mouth daily.  Marland Kitchen aspirin EC 81 MG tablet Take 1 tablet (81 mg total) by mouth daily.  Marland Kitchen atorvastatin (LIPITOR) 80 MG tablet Take 1 tablet (80 mg total) by mouth daily.  . benzonatate (TESSALON) 100 MG capsule Take 1 capsule (100 mg total) by mouth 3 (three) times daily as needed for cough.  . chlorpheniramine (ALLERGY) 4 MG tablet Take 8 mg by mouth at bedtime.   . finasteride (PROSCAR) 5 MG tablet Take 1 tablet (5 mg total) by mouth daily.  . fluticasone (FLONASE) 50 MCG/ACT nasal spray Place 2 sprays into both nostrils daily. (Patient taking differently: Place 2 sprays into both nostrils daily as needed for allergies. )  . HYDROcodone-homatropine (HYCODAN) 5-1.5 MG/5ML syrup Take 5 mLs by mouth every 8 (eight) hours as needed for cough.  . Melatonin 5 MG TABS Take 5 mg by mouth at bedtime.  . metoprolol tartrate (LOPRESSOR) 25 MG tablet Take 1 tablet (25 mg total) by mouth 2 (two) times daily.  . pantoprazole (PROTONIX) 40 MG tablet Take 1 tablet (40 mg total) by mouth 2 (two) times daily.  . pramipexole (MIRAPEX) 0.25 MG tablet TAKE 2 TABLETS BY MOUTH EVERY DAY AT BEDTIME  . tadalafil (CIALIS) 5 MG tablet Take 1 tablet (5 mg total) by  mouth daily.  Marland Kitchen tiZANidine (ZANAFLEX) 4 MG tablet Take 1 tablet (4 mg total) by mouth every 8 (eight) hours as needed for muscle spasms.  . valACYclovir (VALTREX) 1000 MG tablet Take 1 tablet (1,000 mg total) by mouth daily. (Patient taking differently: Take 1,000 mg by mouth daily as needed. )   Current Facility-Administered Medications for the 03/02/19 encounter (Telemedicine) with Troy Sine, MD  Medication  . sodium chloride flush (NS) 0.9 % injection 3 mL     Allergies:   Patient has no known allergies.   Social History   Tobacco Use  . Smoking status: Former Smoker    Packs/day: 0.50    Years: 15.00    Pack years: 7.50    Types: Cigarettes    Last attempt to quit: 10/01/1988    Years since  quitting: 30.4  . Smokeless tobacco: Never Used  Substance Use Topics  . Alcohol use: No    Alcohol/week: 0.0 standard drinks  . Drug use: No     Family Hx: The patient's family history includes Breast cancer in an other family member; Coronary artery disease in an other family member; Diabetes in an other family member.  ROS:   Please see the history of present illness.    He denies any fevers chills night sweats.  No cough.  No change in smell or sense of taste No wheezing No chest tightness or pressure.  He has remained active in the yard No palpitations. No presyncope or syncope Recent diagnosis of PTSD On CPAP therapy for OSA, sleeping well GERD All other systems reviewed and are negative.   Prior CV studies:   The following studies were reviewed today:   Prox RCA lesion is 20% stenosed.  Prox RCA to Mid RCA lesion is 20% stenosed.  Mid RCA lesion is 20% stenosed.  1st Mrg lesion is 60% stenosed.  Prox LAD lesion is 55% stenosed.  Ost 1st Diag lesion is 50% stenosed.  The left ventricular systolic function is normal.   Diffuse multivessel coronary calcification 50 to 60% proximal LAD stenosis, diffuse 50% stenosis in the first diagonal branch of the LAD and  mild irregularity in the mid distal LAD without significant stenoses; left circumflex vessel with a large OM1 vessel with smooth 60% mid distal OM stenosis proximal to a bifurcation; and mild irregularity with proximal to mid calcification of a dominant RCA with stenoses of approximately 20%.  Preserved LV contractility with EF estimated 55%; LVEDP 16 mmHg.  RECOMMENDATION: Initial medical therapy.  The patient had recently been started on low-dose beta-blocker therapy which may be able to be titrated as blood pressure and heart rate allow.  He was hypertensive initially.  We will add amlodipine 5 mg to his medical regimen.  Recommend aggressive lipid-lowering therapy with increase of atorvastatin to 80 mg in attempt to induce plaque regression.   Intervention     Labs/Other Tests and Data Reviewed:    EKG:  An ECG dated Feb 13, 2019 was personally reviewed today and demonstrated:  Sinus bradycardia at 47 bpm with sinus arrhythmia.  Recent Labs: 10/31/2018: ALT 18 02/10/2019: Hemoglobin 14.1; Platelets 186 02/13/2019: BUN 17; Creatinine, Ser 0.91; Potassium 3.5; Sodium 139   Recent Lipid Panel Lab Results  Component Value Date/Time   CHOL 105 07/17/2018 08:18 AM   TRIG 102 07/17/2018 08:18 AM   HDL 37 (L) 07/17/2018 08:18 AM   CHOLHDL 2.8 07/17/2018 08:18 AM   LDLCALC 49 07/17/2018 08:18 AM    Wt Readings from Last 3 Encounters:  03/02/19 238 lb (108 kg)  02/13/19 240 lb (108.9 kg)  11/12/18 256 lb 12.8 oz (116.5 kg)     Objective:    Vital Signs:  Ht 6\' 3"  (1.905 m)   Wt 238 lb (108 kg)   BMI 29.75 kg/m    This is was a phone visit I was able to visually examine the patient.  Ever he states he does not have any change in physical appearance since I last saw him at the day of his catheterization. Breathing is normal and not labored There is no audible wheezing His heart rate was 54 and he had a regular pulse by palpation He did not have a blood pressure cuff today to  take his blood pressure. He admits to more gas and occasional abdominal bloating  No edema Note neurologic symptoms but was diagnosed with posttraumatic stress disorder Normal cognition and affect    ASSESSMENT & PLAN:    1. Coronary/calcification/CAD: The patient had significantly abnormal coronary calcium score leading to definitive cardiac catheterization.  I reviewed the catheterization findings in detail with the patient.  He has been angina free on his current medical regimen which now consists of amlodipine 5 mg and metoprolol tartrate 25 mg twice a day.  His resting pulse is in the 50s.  His blood pressure he believes has been stable.  I was unable to obtain his blood pressure report today. 2. Essential hypertension: Currently on amlodipine and low-dose metoprolol. 3. Hyperlipidemia: Target less than 70.  Atorvastatin dose was increased to 80 mg following his catheterization in an attempt to induce plaque regression. 4. Obstructive sleep apnea: Currently on BiPAP therapy with an EPAP pressure minimum of 14 and pressure support of 4. 5. GERD: Currently controlled on Protonix 6. Posttraumatic stress disorder: Recently diagnosed at the [place].  COVID-19 Education: The signs and symptoms of COVID-19 were discussed with the patient and how to seek care for testing (follow up with PCP or arrange E-visit).  The importance of social distancing was discussed today.  Time:   Today, I have spent 25 minutes with the patient with telehealth technology discussing the above problems.     Medication Adjustments/Labs and Tests Ordered: Current medicines are reviewed at length with the patient today.  Concerns regarding medicines are outlined above.   Tests Ordered: No orders of the defined types were placed in this encounter.   Medication Changes: No orders of the defined types were placed in this encounter.   Disposition:  Follow up fasting laboratory in August with plans for follow-up office  visit  Signed, Shelva Majestic, MD  03/02/2019 9:24 AM    [place]

## 2019-03-02 NOTE — Patient Instructions (Signed)
Medication Instructions:  The current medical regimen is effective;  continue present plan and medications.  If you need a refill on your cardiac medications before your next appointment, please call your pharmacy.   Lab work: CMET, LIPID in July  Attached are the lab orders that are needed before your upcoming appointment, please come in July, anytime to have your labs drawn.   They are fasting labs, so nothing to eat or drink after midnight.  Lab hours: 8:00-4:00 lunch hours 12:45-1:45  If you have labs (blood work) drawn today and your tests are completely normal, you will receive your results only by: Marland Kitchen MyChart Message (if you have MyChart) OR . A paper copy in the mail If you have any lab test that is abnormal or we need to change your treatment, we will call you to review the results.  Follow-Up: At [facility], you and your health needs are our priority.  As part of our continuing mission to provide you with exceptional heart care, we have created designated Provider Care Teams.  These Care Teams include your primary Cardiologist (physician) and Advanced Practice Providers (APPs -  Physician Assistants and Nurse Practitioners) who all work together to provide you with the care you need, when you need it. You will need a follow up appointment in 3 months.   You may see Shelva Majestic, MD or one of the following Advanced Practice Providers on your designated Care Team: [place], [place] . Fabian Sharp, PA-C

## 2019-03-16 ENCOUNTER — Other Ambulatory Visit: Payer: Self-pay | Admitting: Family Medicine

## 2019-04-21 ENCOUNTER — Other Ambulatory Visit: Payer: Medicare Other

## 2019-04-22 DIAGNOSIS — I10 Essential (primary) hypertension: Secondary | ICD-10-CM | POA: Diagnosis not present

## 2019-04-22 DIAGNOSIS — E785 Hyperlipidemia, unspecified: Secondary | ICD-10-CM | POA: Diagnosis not present

## 2019-04-22 LAB — COMPREHENSIVE METABOLIC PANEL
ALT: 20 IU/L (ref 0–44)
AST: 26 IU/L (ref 0–40)
Albumin/Globulin Ratio: 2 (ref 1.2–2.2)
Albumin: 4.3 g/dL (ref 3.7–4.7)
Alkaline Phosphatase: 102 IU/L (ref 39–117)
BUN/Creatinine Ratio: 12 (ref 10–24)
BUN: 12 mg/dL (ref 8–27)
Bilirubin Total: 0.7 mg/dL (ref 0.0–1.2)
CO2: 24 mmol/L (ref 20–29)
Calcium: 9.1 mg/dL (ref 8.6–10.2)
Chloride: 105 mmol/L (ref 96–106)
Creatinine, Ser: 0.99 mg/dL (ref 0.76–1.27)
GFR calc Af Amer: 88 mL/min/{1.73_m2} (ref >59)
GFR calc non Af Amer: 76 mL/min/{1.73_m2} (ref >59)
Globulin, Total: 2.1 g/dL (ref 1.5–4.5)
Glucose: 90 mg/dL (ref 65–99)
Potassium: 4.5 mmol/L (ref 3.5–5.2)
Sodium: 144 mmol/L (ref 134–144)
Total Protein: 6.4 g/dL (ref 6.0–8.5)

## 2019-04-22 LAB — LIPID PANEL
Chol/HDL Ratio: 2.4 ratio (ref 0.0–5.0)
Cholesterol, Total: 102 mg/dL (ref 100–199)
HDL: 43 mg/dL (ref >39)
LDL Calculated: 44 mg/dL (ref 0–99)
Triglycerides: 76 mg/dL (ref 0–149)
VLDL Cholesterol Cal: 15 mg/dL (ref 5–40)

## 2019-04-23 ENCOUNTER — Other Ambulatory Visit: Payer: Self-pay | Admitting: Family Medicine

## 2019-04-23 DIAGNOSIS — R6889 Other general symptoms and signs: Secondary | ICD-10-CM | POA: Diagnosis not present

## 2019-04-23 DIAGNOSIS — Z20822 Contact with and (suspected) exposure to covid-19: Secondary | ICD-10-CM

## 2019-04-26 LAB — NOVEL CORONAVIRUS, NAA: SARS-CoV-2, NAA: NOT DETECTED

## 2019-05-06 ENCOUNTER — Other Ambulatory Visit: Payer: Self-pay

## 2019-05-06 ENCOUNTER — Ambulatory Visit (INDEPENDENT_AMBULATORY_CARE_PROVIDER_SITE_OTHER): Payer: Medicare Other | Admitting: Cardiovascular Disease

## 2019-05-06 ENCOUNTER — Encounter: Payer: Self-pay | Admitting: Cardiovascular Disease

## 2019-05-06 VITALS — BP 125/65 | HR 63 | Temp 97.3°F | Ht 75.0 in | Wt 246.4 lb

## 2019-05-06 DIAGNOSIS — E785 Hyperlipidemia, unspecified: Secondary | ICD-10-CM | POA: Diagnosis not present

## 2019-05-06 DIAGNOSIS — I2510 Atherosclerotic heart disease of native coronary artery without angina pectoris: Secondary | ICD-10-CM | POA: Diagnosis not present

## 2019-05-06 DIAGNOSIS — R600 Localized edema: Secondary | ICD-10-CM

## 2019-05-06 DIAGNOSIS — G4733 Obstructive sleep apnea (adult) (pediatric): Secondary | ICD-10-CM | POA: Diagnosis not present

## 2019-05-06 DIAGNOSIS — I10 Essential (primary) hypertension: Secondary | ICD-10-CM | POA: Diagnosis not present

## 2019-05-06 MED ORDER — HYDROCHLOROTHIAZIDE 12.5 MG PO CAPS: 12.5000 mg | ORAL_CAPSULE | ORAL | 3 refills | Status: DC | PRN

## 2019-05-06 NOTE — Progress Notes (Signed)
Primary MD: Dr. Jenna Luo  PATIENT PROFILE: Gregory Suarez is a 73 y.o. male who presents for 65-monthevaluation following his March 02, 2019 telemedicine visit.  HPI:  Gregory Arronais a former patient of Dr. SJoni Fears  He tells me he underwent heart catheterization almost 20 ago and was told of having mild blockage which medical therapy was recommended. He has a long history of sleep apnea and initially was followed by Dr. KHumberto Seals  He on his third CPAP machine.  He has a istory of significant obesity when I last saw him in 2017 he had lost over 50 pounds   He denied any exertional chest pain but admits to intermittent left sharp, achy discomfort which is nonexertional.  He denied associated palpitations or shortness of breath.  He admitted to leg swelling and wore an ankle brace..  When I last saw him, he was stable from a cardiac standpoint.  He underwent an echo Doppler study in December 2017 which showed an EF of 55 to 60%.  Over the past several years, he had done well with reference to chest pain or palpitations.  I  saw him in February 2020. He had undergone a recent chest CT which showed emphysema, aortic atherosclerosis, as well as extensive coronary calcifications. He has not been using his CPAP therapy and I obtained a download  in the office which showed only 2 days of use over the last 90 days. He also has a history of restless leg syndrome. He typically goes to bed between 9 and 11 PM and wakes up between 7 and 9 AM. He had see Dr. PDennard Schaumannand because of his abnormal CT scan was sent to me in February for follow-up evaluation.  At that time, in light of his significant coronary calcification I recommended that he undergo a coronary CT angiogram.  This was significantly abnormal and revealed a markedly elevated calcium score of 2264 with diffuse coronary calcification and stenoses in the proximal LAD, diagonal, circumflex marginal, and RCA.   As a result he was referred for  cardiac catheterization which I performed on Feb 13 2019.  He was found to have diffuse multivessel coronary calcification with 50 to 60% proximal LAD stenoses, diffuse 50% stenosis in the first diagonal branch of the LAD with mild irregularity in the mid distal LAD without high-grade stenosis.  Left circumflex vessel had smooth 60% mid distal OM stenosis in the RCA had mild irregularity with proximal to mid calcification and a dominant RCA with stenosis of 20%.  He had preserved LV contractility with an EF at 55%.  Medical therapy was recommended.  He had been started on low-dose beta-blocker therapy prior to the catheterization and subsequent to the procedure I recommended initiation of amlodipine 5 mg and further titration of atorvastatin to 80 mg.  Since his telemedicine evaluation he has continued to do well and denies any chest pain, palpitations or shortness of breath.  He states his heart rate typically runs in the mid 569s  He has noticed leg edema.  He has been 100% compliant with BiPAP therapy.  A new download was obtained from July 6 through May 05, 2019.  This shows 100% use and he is averaging 8 hours and 56 minutes of BiPAP therapy per night.  At an 18/14 pressure, AHI is excellent at 1.6.  There is no leak.  He continues to be on atorvastatin 80 mg.  LDL cholesterol on April 22, 2019 was excellent at 457  with total cholesterol 102 HDL 43 triglyceride 76.  He presents for reevaluation.  Past Medical History:  Diagnosis Date  . CAD (coronary artery disease)   . Elevated PSA   . Erectile dysfunction   . GERD (gastroesophageal reflux disease)   . Hyperlipidemia   . Hypertension   . Mitral valve regurgitation   . OSA treated with BiPAP   . RLS (restless legs syndrome)   . Tubular adenoma of colon     Past Surgical History:  Procedure Laterality Date  . ANKLE SURGERY    . CHOLECYSTECTOMY    . CHOLECYSTECTOMY    . LEFT HEART CATH AND CORONARY ANGIOGRAPHY N/A 02/13/2019   Procedure:  LEFT HEART CATH AND CORONARY ANGIOGRAPHY;  Surgeon: Troy Sine, MD;  Location: [facility];  Service: Cardiovascular;  Laterality: N/A;    No Known Allergies  Current Outpatient Medications  Medication Sig Dispense Refill  . amLODipine (NORVASC) 5 MG tablet Take 1 tablet (5 mg total) by mouth daily. 30 tablet 11  . aspirin EC 81 MG tablet Take 1 tablet (81 mg total) by mouth daily. 90 tablet 3  . atorvastatin (LIPITOR) 80 MG tablet Take 1 tablet (80 mg total) by mouth daily. 90 tablet 3  . benzonatate (TESSALON) 100 MG capsule Take 1 capsule (100 mg total) by mouth 3 (three) times daily as needed for cough. 30 capsule 0  . chlorpheniramine (ALLERGY) 4 MG tablet Take 8 mg by mouth at bedtime.     . finasteride (PROSCAR) 5 MG tablet Take 1 tablet (5 mg total) by mouth daily. 90 tablet 3  . fluticasone (FLONASE) 50 MCG/ACT nasal spray Place 2 sprays into both nostrils daily. (Patient taking differently: Place 2 sprays into both nostrils daily as needed for allergies. ) 16 g 2  . HYDROcodone-homatropine (HYCODAN) 5-1.5 MG/5ML syrup Take 5 mLs by mouth every 8 (eight) hours as needed for cough. 120 mL 0  . Melatonin 5 MG TABS Take 5 mg by mouth at bedtime.    . metoprolol tartrate (LOPRESSOR) 25 MG tablet Take 1 tablet (25 mg total) by mouth 2 (two) times daily. 180 tablet 2  . pantoprazole (PROTONIX) 40 MG tablet TAKE 1 TABLET BY MOUTH TWICE A DAY 180 tablet 3  . pramipexole (MIRAPEX) 0.25 MG tablet TAKE 2 TABLETS BY MOUTH EVERY DAY AT BEDTIME 180 tablet 1  . tadalafil (CIALIS) 5 MG tablet Take 1 tablet (5 mg total) by mouth daily. 90 tablet 3  . tiZANidine (ZANAFLEX) 4 MG tablet Take 1 tablet (4 mg total) by mouth every 8 (eight) hours as needed for muscle spasms. 30 tablet 1  . valACYclovir (VALTREX) 1000 MG tablet Take 1 tablet (1,000 mg total) by mouth daily. (Patient taking differently: Take 1,000 mg by mouth daily as needed. ) 30 tablet 1  . hydrochlorothiazide (MICROZIDE) 12.5 MG  capsule Take 1 capsule (12.5 mg total) by mouth as needed (swelling). 90 capsule 3   Current Facility-Administered Medications  Medication Dose Route Frequency Provider Last Rate Last Dose  . sodium chloride flush (NS) 0.9 % injection 3 mL  3 mL Intravenous Q12H Almyra Deforest, PA        Social History   Socioeconomic History  . Marital status: Married    Spouse name: Not on file  . Number of children: Not on file  . Years of education: Not on file  . Highest education level: Not on file  Occupational History  . Occupation: RETIRED  Social  Needs  . Financial resource strain: Not on file  . Food insecurity    Worry: Not on file    Inability: Not on file  . Transportation needs    Medical: Not on file    Non-medical: Not on file  Tobacco Use  . Smoking status: Former Smoker    Packs/day: 0.50    Years: 15.00    Pack years: 7.50    Types: Cigarettes    Quit date: 10/01/1988    Years since quitting: 30.6  . Smokeless tobacco: Never Used  Substance and Sexual Activity  . Alcohol use: No    Alcohol/week: 0.0 standard drinks  . Drug use: No  . Sexual activity: Yes    Comment: married, 2 grown sons, retired maintenance man  Lifestyle  . Physical activity    Days per week: Not on file    Minutes per session: Not on file  . Stress: Not on file  Relationships  . Social connections    Talks on phone: Not on file    Gets together: Not on file    Attends religious service: Not on file    Active member of club or organization: Not on file    Attends meetings of clubs or organizations: Not on file    Relationship status: Not on file  . Intimate partner violence    Fear of current or ex partner: Not on file    Emotionally abused: Not on file    Physically abused: Not on file    Forced sexual activity: Not on file  Other Topics Concern  . Not on file  Social History Narrative  . Not on file   Additional social history is notable that he is married for 44 years.  He has 2  children and 4 grandchildren.  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  He remotely smoked but quit in 1990.  He is not routinely exercise.  Family History  Problem Relation Age of Onset  . Diabetes Other   . Breast cancer Other   . Coronary artery disease Other    Family history is notable that his mother died at 96 with cancer.  Father had heart disease and diabetes mellitus.  He has 3 brothers, one had heart valve replacement in 2017 and another had undergone CABG revascularization surgery.  He has 2 living sisters and one sister died in an accident.  ROS General: Negative; No fevers, chills, or night sweats HEENT: Negative; No changes in vision or hearing, sinus congestion, difficulty swallowing Pulmonary: Negative; No cough, wheezing, shortness of breath, hemoptysis Cardiovascular:  See HPI; No chest pain, presyncope, syncope, palpitations, edema GI: Negative; No nausea, vomiting, diarrhea, or abdominal pain GU: Negative; No dysuria, hematuria, or difficulty voiding Musculoskeletal: Negative; no myalgias, joint pain, or weakness Hematologic/Oncologic: Negative; no easy bruising, bleeding Endocrine: Negative; no heat/cold intolerance; no diabetes Neuro: Negative; no changes in balance, headaches Skin: Negative; No rashes or skin lesions Psychiatric: Negative; No behavioral problems, depression Sleep: Positive for obstructive sleep apnea on his third BiPAP machine.  No daytime sleepiness, hypersomnolence, bruxism, restless legs, hypnogagnic hallucinations Other comprehensive 14 point system review is negative   Physical Exam BP 125/65   Pulse 63   Temp (!) 97.3 F (36.3 C)   Ht 6' 3" (1.905 m)   Wt 246 lb 6.4 oz (111.8 kg)   SpO2 97%   BMI 30.80 kg/m    Repeat blood pressure by me 124/68  Wt Readings from Last   3 Encounters:  05/06/19 246 lb 6.4 oz (111.8 kg)  03/02/19 238 lb (108 kg)  02/13/19 240 lb (108.9 kg)   General: Alert, oriented, no  distress.  Skin: normal turgor, no rashes, warm and dry HEENT: Normocephalic, atraumatic. Pupils equal round and reactive to light; sclera anicteric; extraocular muscles intact;  Nose without nasal septal hypertrophy Mouth/Parynx benign; Mallinpatti scale 3 Neck: No JVD, no carotid bruits; normal carotid upstroke Lungs: clear to ausculatation and percussion; no wheezing or rales Chest wall: without tenderness to palpitation Heart: PMI not displaced, RRR, s1 s2 normal, 1/6 systolic murmur, no diastolic murmur, no rubs, gallops, thrills, or heaves Abdomen: soft, nontender; no hepatosplenomehaly, BS+; abdominal aorta nontender and not dilated by palpation. Back: no CVA tenderness Pulses 2+ Musculoskeletal: full range of motion, normal strength, no joint deformities Extremities: no clubbing cyanosis or edema, Homan's sign negative  Neurologic: grossly nonfocal; Cranial nerves grossly wnl Psychologic: Normal mood and affect   ECG (independently read by me): Sinus bradycardia 53 bpm.  LVH by voltage criteria.  Left anterior hemiblock.  Normal intervals.  October 2017 ECG (independently read by me): Marked sinus bradycardia at 42 bpm.  No ectopy.  LABS:  BMP Latest Ref Rng & Units 04/22/2019 02/13/2019 02/02/2019  Glucose 65 - 99 mg/dL 90 87 88  BUN 8 - 27 mg/dL 12 17 18  Creatinine 0.76 - 1.27 mg/dL 0.99 0.91 0.90  BUN/Creat Ratio 10 - 24 12 - 20  Sodium 134 - 144 mmol/L 144 139 142  Potassium 3.5 - 5.2 mmol/L 4.5 3.5 3.7  Chloride 96 - 106 mmol/L 105 108 106  CO2 20 - 29 mmol/L 24 23 20  Calcium 8.6 - 10.2 mg/dL 9.1 8.4(L) 8.8     Hepatic Function Latest Ref Rng & Units 04/22/2019 10/31/2018 07/17/2018  Total Protein 6.0 - 8.5 g/dL 6.4 6.9 6.6  Albumin 3.7 - 4.7 g/dL 4.3 - -  AST 0 - 40 IU/L 26 23 20  ALT 0 - 44 IU/L 20 18 15  Alk Phosphatase 39 - 117 IU/L 102 - -  Total Bilirubin 0.0 - 1.2 mg/dL 0.7 0.8 0.7    CBC Latest Ref Rng & Units 02/10/2019 10/31/2018 07/17/2018  WBC 3.4 -  10.8 x10E3/uL 5.7 13.3(H) 7.6  Hemoglobin 13.0 - 17.7 g/dL 14.1 15.4 15.2  Hematocrit 37.5 - 51.0 % 40.6 45.6 45.3  Platelets 150 - 450 x10E3/uL 186 232 194   Lab Results  Component Value Date   MCV 89 02/10/2019   MCV 87.9 10/31/2018   MCV 88.5 07/17/2018   Lab Results  Component Value Date   TSH 1.89 02/07/2017   No results found for: HGBA1C   BNP No results found for: BNP  ProBNP No results found for: PROBNP   Lipid Panel     Component Value Date/Time   CHOL 102 04/22/2019 1022   TRIG 76 04/22/2019 1022   HDL 43 04/22/2019 1022   CHOLHDL 2.4 04/22/2019 1022   CHOLHDL 2.8 07/17/2018 0818   VLDL 18 02/07/2017 0809   LDLCALC 44 04/22/2019 1022   LDLCALC 49 07/17/2018 0818    RADIOLOGY: No results found.    IMPRESSION: 1. CAD in native artery   2. Essential hypertension   3. Leg edema   4. OSA (obstructive sleep apnea)   5. Hyperlipidemia with target LDL less than 70     ASSESSMENT AND PLAN: 1.  Coronary calcification/CAD: The patient had a significantly abnormal calcium score leading to his most recent definitive   cardiac catheterization.  There is diffuse multivessel coronary calcification of 50 to 60% in the proximal LAD, diffuse 50% stenosis in the first diagonal branch of the LAD and irregularity of the mid distal LAD without significant stenosis.  The left circumflex vessel had 60% mid distal OM stenosis proximal to the bifurcation and there was mild irregularity with proximal to mid calcification and a dominant RCA.  He continues to be asymptomatic on medical therapy consisting of amlodipine 5 mg, metoprolol 25 mg twice a day.  Resting pulse today is in the 60s.  2.  Essential hypertension.  Blood pressure today is well controlled on his current regimen.  However, there is 1+  lower extremity edema.  3.  Leg edema: I have recommended the addition of HCTZ 12.5 mg to his medical regimen.  Also for stockings have been recommended.  4.  OSA on BiPAP: Download  is excellent on his current pressure of 18/14 with AHI 1.6.  There is no mask leak.  5.  Hyperlipidemia: He continues to be on atorvastatin 80 mg daily.  Target LDL less than 70.  6. GERD: Currently controlled on Protonix.  7. Posttraumatic stress disorder: Diagnosed at the [facility] hospital.  I will see him in 6 months for reevaluation.  Time spent: 25 minutes  Troy Sine, MD, [facility] 05/08/2019 7:20 PM

## 2019-05-06 NOTE — Patient Instructions (Signed)
Medication Instructions:  Start HCTZ 12.5 mg- take for a few days, and then only as needed for swelling. If you need a refill on your cardiac medications before your next appointment, please call your pharmacy.   Follow-Up: At [facility], you and your health needs are our priority.  As part of our continuing mission to provide you with exceptional heart care, we have created designated Provider Care Teams.  These Care Teams include your primary Cardiologist (physician) and Advanced Practice Providers (APPs -  Physician Assistants and Nurse Practitioners) who all work together to provide you with the care you need, when you need it. You will need a follow up appointment in 6 months.  Please call our office 2 months in advance to schedule this appointment.  You may see Shelva Majestic, MD or one of the following Advanced Practice Providers on your designated Care Team: [place], [place] . Fabian Sharp, PA-C

## 2019-05-08 ENCOUNTER — Encounter: Payer: Self-pay | Admitting: Cardiovascular Disease

## 2019-07-20 ENCOUNTER — Ambulatory Visit: Payer: Medicare Other

## 2019-07-23 ENCOUNTER — Ambulatory Visit (INDEPENDENT_AMBULATORY_CARE_PROVIDER_SITE_OTHER): Payer: Medicare Other | Admitting: *Deleted

## 2019-07-23 ENCOUNTER — Other Ambulatory Visit: Payer: Self-pay

## 2019-07-23 DIAGNOSIS — Z23 Encounter for immunization: Secondary | ICD-10-CM

## 2019-07-23 NOTE — Progress Notes (Signed)
Patient seen in office for Influenza Vaccination.   Tolerated IM administration well.   Immunization history updated.  

## 2019-09-08 ENCOUNTER — Encounter: Payer: Medicare Other | Admitting: Family Medicine

## 2019-10-27 DIAGNOSIS — R140 Abdominal distension (gaseous): Secondary | ICD-10-CM | POA: Diagnosis not present

## 2019-10-27 DIAGNOSIS — R194 Change in bowel habit: Secondary | ICD-10-CM | POA: Diagnosis not present

## 2019-10-27 DIAGNOSIS — K219 Gastro-esophageal reflux disease without esophagitis: Secondary | ICD-10-CM | POA: Diagnosis not present

## 2019-10-27 DIAGNOSIS — R634 Abnormal weight loss: Secondary | ICD-10-CM | POA: Diagnosis not present

## 2019-11-03 ENCOUNTER — Other Ambulatory Visit: Payer: Medicare Other

## 2019-11-05 ENCOUNTER — Encounter: Payer: Medicare Other | Admitting: Family Medicine

## 2019-11-09 ENCOUNTER — Ambulatory Visit: Payer: Medicare Other

## 2019-11-25 ENCOUNTER — Other Ambulatory Visit: Payer: Self-pay | Admitting: Family Medicine

## 2019-11-28 ENCOUNTER — Other Ambulatory Visit: Payer: Self-pay | Admitting: Family Medicine

## 2019-12-07 ENCOUNTER — Other Ambulatory Visit: Payer: Self-pay | Admitting: Family Medicine

## 2019-12-07 DIAGNOSIS — Z125 Encounter for screening for malignant neoplasm of prostate: Secondary | ICD-10-CM

## 2019-12-07 DIAGNOSIS — Z0000 Encounter for general adult medical examination without abnormal findings: Secondary | ICD-10-CM

## 2019-12-08 ENCOUNTER — Other Ambulatory Visit: Payer: Self-pay

## 2019-12-08 ENCOUNTER — Other Ambulatory Visit: Payer: Medicare Other

## 2019-12-08 DIAGNOSIS — Z79899 Other long term (current) drug therapy: Secondary | ICD-10-CM | POA: Diagnosis not present

## 2019-12-08 DIAGNOSIS — Z125 Encounter for screening for malignant neoplasm of prostate: Secondary | ICD-10-CM

## 2019-12-08 DIAGNOSIS — Z136 Encounter for screening for cardiovascular disorders: Secondary | ICD-10-CM | POA: Diagnosis not present

## 2019-12-08 DIAGNOSIS — Z0000 Encounter for general adult medical examination without abnormal findings: Secondary | ICD-10-CM

## 2019-12-08 DIAGNOSIS — Z13220 Encounter for screening for lipoid disorders: Secondary | ICD-10-CM | POA: Diagnosis not present

## 2019-12-09 LAB — COMPREHENSIVE METABOLIC PANEL
AG Ratio: 1.4 (calc) (ref 1.0–2.5)
ALT: 15 U/L (ref 9–46)
AST: 19 U/L (ref 10–35)
Albumin: 3.9 g/dL (ref 3.6–5.1)
Alkaline phosphatase (APISO): 92 U/L (ref 35–144)
BUN: 12 mg/dL (ref 7–25)
CO2: 30 mmol/L (ref 20–32)
Calcium: 9 mg/dL (ref 8.6–10.3)
Chloride: 105 mmol/L (ref 98–110)
Creat: 0.94 mg/dL (ref 0.70–1.18)
Globulin: 2.7 g/dL (calc) (ref 1.9–3.7)
Glucose, Bld: 82 mg/dL (ref 65–99)
Potassium: 4.3 mmol/L (ref 3.5–5.3)
Sodium: 142 mmol/L (ref 135–146)
Total Bilirubin: 0.8 mg/dL (ref 0.2–1.2)
Total Protein: 6.6 g/dL (ref 6.1–8.1)

## 2019-12-09 LAB — CBC WITH DIFFERENTIAL/PLATELET
Absolute Monocytes: 661 cells/uL (ref 200–950)
Basophils Absolute: 30 cells/uL (ref 0–200)
Basophils Relative: 0.5 %
Eosinophils Absolute: 218 cells/uL (ref 15–500)
Eosinophils Relative: 3.7 %
HCT: 43.6 % (ref 38.5–50.0)
Hemoglobin: 14.5 g/dL (ref 13.2–17.1)
Lymphs Abs: 968 cells/uL (ref 850–3900)
MCH: 31.2 pg (ref 27.0–33.0)
MCHC: 33.3 g/dL (ref 32.0–36.0)
MCV: 93.8 fL (ref 80.0–100.0)
MPV: 10 fL (ref 7.5–12.5)
Monocytes Relative: 11.2 %
Neutro Abs: 4024 cells/uL (ref 1500–7800)
Neutrophils Relative %: 68.2 %
Platelets: 176 10*3/uL (ref 140–400)
RBC: 4.65 10*6/uL (ref 4.20–5.80)
RDW: 13 % (ref 11.0–15.0)
Total Lymphocyte: 16.4 %
WBC: 5.9 10*3/uL (ref 3.8–10.8)

## 2019-12-09 LAB — LIPID PANEL
Cholesterol: 113 mg/dL (ref <200)
HDL: 44 mg/dL (ref >=40)
LDL Cholesterol (Calc): 53 mg/dL (calc)
Non-HDL Cholesterol (Calc): 69 mg/dL (calc) (ref <130)
Total CHOL/HDL Ratio: 2.6 (calc) (ref <5.0)
Triglycerides: 79 mg/dL (ref <150)

## 2019-12-09 LAB — PSA: PSA: 0.4 ng/mL (ref <=4.0)

## 2019-12-10 ENCOUNTER — Ambulatory Visit (INDEPENDENT_AMBULATORY_CARE_PROVIDER_SITE_OTHER): Payer: Medicare Other | Admitting: Family Medicine

## 2019-12-10 ENCOUNTER — Other Ambulatory Visit: Payer: Self-pay

## 2019-12-10 ENCOUNTER — Encounter: Payer: Self-pay | Admitting: Family Medicine

## 2019-12-10 VITALS — BP 136/62 | HR 50 | Temp 99.0°F | Resp 16 | Ht 75.0 in | Wt 241.0 lb

## 2019-12-10 DIAGNOSIS — I2510 Atherosclerotic heart disease of native coronary artery without angina pectoris: Secondary | ICD-10-CM

## 2019-12-10 DIAGNOSIS — Z125 Encounter for screening for malignant neoplasm of prostate: Secondary | ICD-10-CM | POA: Diagnosis not present

## 2019-12-10 DIAGNOSIS — D126 Benign neoplasm of colon, unspecified: Secondary | ICD-10-CM

## 2019-12-10 DIAGNOSIS — M25512 Pain in left shoulder: Secondary | ICD-10-CM

## 2019-12-10 DIAGNOSIS — Z0000 Encounter for general adult medical examination without abnormal findings: Secondary | ICD-10-CM

## 2019-12-10 DIAGNOSIS — I10 Essential (primary) hypertension: Secondary | ICD-10-CM | POA: Diagnosis not present

## 2019-12-10 NOTE — Progress Notes (Signed)
Subjective:    Patient ID: Gregory Suarez, male    DOB: 1946/02/26, 74 y.o.   MRN: [id number]  HPI  2019 Patient is here today for complete physical exam.  Since I last saw the patient, he is under the care of a psychiatrist through the [facility] for PTSD.  His last colonoscopy was performed in 2018 and was significant for polyps.  His gastroenterologist recommended a repeat colonoscopy in 3 to 5 years.  He is no longer seeing urology however we checked his PSA on his lab work and was found to be excellent at 0.6.  He is due for hepatitis C but he declines this at the present time.  He is due for his flu shot and he received that today.  The remainder of his medical screening is up-to-date.  12/10/19 Here for CPE.  Patient's only medical concern is pain in his left shoulder.  He reports pain with abduction greater than 90 degrees.  Pain with internal and external rotation.  He reports pain at night while he is trying to sleep.  He has pain with range of motion.  He is requesting a cortisone injection in his shoulder today to help with the pain.  He is also reporting pain and stiffness in both hands and both elbows.  He does have depression but is being treated for PTSD through the [place].  He denies any falls.  He denies any memory loss.  Last colonoscopy was 2018 and was significant for tubular adenoma.  His immunizations are up-to-date as shown below.  His most recent lab work is also listed below and is outstanding  Immunization History  Administered Date(s) Administered  . Fluad Quad(high Dose 65+) 07/23/2019  . Influenza, High Dose Seasonal PF 07/24/2018  . PFIZER SARS-COV-2 Vaccination 10/27/2019, 11/17/2019  . Pneumococcal Conjugate-13 01/16/2014  . Pneumococcal Polysaccharide-23 02/04/2015  . Td 03/31/2009  . Tdap 05/24/2009  . Zoster 07/06/2011    Appointment on 12/08/2019  Component Date Value Ref Range Status  . PSA 12/08/2019 0.4  < OR = 4.0 ng/mL Final   Comment: The  total PSA value from this assay system is  standardized against the WHO standard. The test  result will be approximately 20% lower when compared  to the equimolar-standardized total PSA (Beckman  Coulter). Comparison of serial PSA results should be  interpreted with this fact in mind. . This test was performed using the Siemens  chemiluminescent method. Values obtained from  different assay methods cannot be used interchangeably. PSA levels, regardless of value, should not be interpreted as absolute evidence of the presence or absence of disease.   . WBC 12/08/2019 5.9  3.8 - 10.8 Thousand/uL Final  . RBC 12/08/2019 4.65  4.20 - 5.80 Million/uL Final  . Hemoglobin 12/08/2019 14.5  13.2 - 17.1 g/dL Final  . HCT 12/08/2019 43.6  38.5 - 50.0 % Final  . MCV 12/08/2019 93.8  80.0 - 100.0 fL Final  . MCH 12/08/2019 31.2  27.0 - 33.0 pg Final  . MCHC 12/08/2019 33.3  32.0 - 36.0 g/dL Final  . RDW 12/08/2019 13.0  11.0 - 15.0 % Final  . Platelets 12/08/2019 176  140 - 400 Thousand/uL Final  . MPV 12/08/2019 10.0  7.5 - 12.5 fL Final  . Neutro Abs 12/08/2019 4,024  1,500 - 7,800 cells/uL Final  . Lymphs Abs 12/08/2019 968  850 - 3,900 cells/uL Final  . Absolute Monocytes 12/08/2019 661  200 - 950 cells/uL Final  . Eosinophils  Absolute 12/08/2019 218  15 - 500 cells/uL Final  . Basophils Absolute 12/08/2019 30  0 - 200 cells/uL Final  . Neutrophils Relative % 12/08/2019 68.2  % Final  . Total Lymphocyte 12/08/2019 16.4  % Final  . Monocytes Relative 12/08/2019 11.2  % Final  . Eosinophils Relative 12/08/2019 3.7  % Final  . Basophils Relative 12/08/2019 0.5  % Final  . Glucose, Bld 12/08/2019 82  65 - 99 mg/dL Final   Comment: .            Fasting reference interval .   . BUN 12/08/2019 12  7 - 25 mg/dL Final  . Creat 12/08/2019 0.94  0.70 - 1.18 mg/dL Final   Comment: For patients >24 years of age, the reference limit for Creatinine is approximately 13% higher for people identified  as African-American. .   Havery Moros Ratio [id number] NOT APPLICABLE  6 - 22 (calc) Final  . Sodium 12/08/2019 142  135 - 146 mmol/L Final  . Potassium 12/08/2019 4.3  3.5 - 5.3 mmol/L Final  . Chloride 12/08/2019 105  98 - 110 mmol/L Final  . CO2 12/08/2019 30  20 - 32 mmol/L Final  . Calcium 12/08/2019 9.0  8.6 - 10.3 mg/dL Final  . Total Protein 12/08/2019 6.6  6.1 - 8.1 g/dL Final  . Albumin 12/08/2019 3.9  3.6 - 5.1 g/dL Final  . Globulin 12/08/2019 2.7  1.9 - 3.7 g/dL (calc) Final  . AG Ratio 12/08/2019 1.4  1.0 - 2.5 (calc) Final  . Total Bilirubin 12/08/2019 0.8  0.2 - 1.2 mg/dL Final  . Alkaline phosphatase (APISO) 12/08/2019 92  35 - 144 U/L Final  . AST 12/08/2019 19  10 - 35 U/L Final  . ALT 12/08/2019 15  9 - 46 U/L Final  . Cholesterol 12/08/2019 113  <200 mg/dL Final  . HDL 12/08/2019 44  > OR = 40 mg/dL Final  . Triglycerides 12/08/2019 79  <150 mg/dL Final  . LDL Cholesterol (Calc) 12/08/2019 53  mg/dL (calc) Final   Comment: Reference range: <100 . Desirable range <100 mg/dL for primary prevention;   <70 mg/dL for patients with CHD or diabetic patients  with > or = 2 CHD risk factors. Marland Kitchen LDL-C is now calculated using the Martin-Hopkins  calculation, which is a validated novel method providing  better accuracy than the Friedewald equation in the  estimation of LDL-C.  Cresenciano Genre et al. Annamaria Helling. [id number]): 2061-2068  (http://education.[facility].com/faq/FAQ164)   . Total CHOL/HDL Ratio 12/08/2019 2.6  <5.0 (calc) Final  . Non-HDL Cholesterol (Calc) 12/08/2019 69  <130 mg/dL (calc) Final   Comment: For patients with diabetes plus 1 major ASCVD risk  factor, treating to a non-HDL-C goal of <100 mg/dL  (LDL-C of <70 mg/dL) is considered a therapeutic  option.     Past Medical History:  Diagnosis Date  . CAD (coronary artery disease)   . Elevated PSA   . Erectile dysfunction   . GERD (gastroesophageal reflux disease)   . Hyperlipidemia   .  Hypertension   . Mitral valve regurgitation   . OSA treated with BiPAP   . RLS (restless legs syndrome)   . Tubular adenoma of colon    Past Surgical History:  Procedure Laterality Date  . ANKLE SURGERY    . CHOLECYSTECTOMY    . CHOLECYSTECTOMY    . LEFT HEART CATH AND CORONARY ANGIOGRAPHY N/A 02/13/2019   Procedure: LEFT HEART CATH AND CORONARY ANGIOGRAPHY;  Surgeon: Troy Sine,  MD;  Location: [facility];  Service: Cardiovascular;  Laterality: N/A;   Current Outpatient Medications on File Prior to Visit  Medication Sig Dispense Refill  . amLODipine (NORVASC) 5 MG tablet Take 1 tablet (5 mg total) by mouth daily. 30 tablet 11  . aspirin EC 81 MG tablet Take 1 tablet (81 mg total) by mouth daily. 90 tablet 3  . atorvastatin (LIPITOR) 80 MG tablet Take 1 tablet (80 mg total) by mouth daily. 90 tablet 3  . benzonatate (TESSALON) 100 MG capsule Take 1 capsule (100 mg total) by mouth 3 (three) times daily as needed for cough. 30 capsule 0  . chlorpheniramine (ALLERGY) 4 MG tablet Take 8 mg by mouth at bedtime.     . finasteride (PROSCAR) 5 MG tablet Take 1 tablet (5 mg total) by mouth daily. 90 tablet 3  . fluticasone (FLONASE) 50 MCG/ACT nasal spray Place 2 sprays into both nostrils daily. (Patient taking differently: Place 2 sprays into both nostrils daily as needed for allergies. ) 16 g 2  . HYDROcodone-homatropine (HYCODAN) 5-1.5 MG/5ML syrup Take 5 mLs by mouth every 8 (eight) hours as needed for cough. 120 mL 0  . Melatonin 5 MG TABS Take 5 mg by mouth at bedtime.    . metoprolol tartrate (LOPRESSOR) 25 MG tablet TAKE 1 TABLET BY MOUTH TWICE A DAY 180 tablet 2  . Multiple Vitamin (MULTIVITAMIN WITH MINERALS) TABS tablet Take 1 tablet by mouth daily. AREDDS for eye health    . pantoprazole (PROTONIX) 40 MG tablet TAKE 1 TABLET BY MOUTH TWICE A DAY 180 tablet 3  . pramipexole (MIRAPEX) 0.25 MG tablet TAKE 2 TABLETS BY MOUTH EVERY DAY AT BEDTIME 180 tablet 1  . tadalafil  (CIALIS) 5 MG tablet TAKE ONE TABLET BY MOUTH DAILY 90 tablet 2  . tiZANidine (ZANAFLEX) 4 MG tablet Take 1 tablet (4 mg total) by mouth every 8 (eight) hours as needed for muscle spasms. 30 tablet 1  . valACYclovir (VALTREX) 1000 MG tablet Take 1 tablet (1,000 mg total) by mouth daily. (Patient taking differently: Take 1,000 mg by mouth daily as needed. ) 30 tablet 1   Current Facility-Administered Medications on File Prior to Visit  Medication Dose Route Frequency Provider Last Rate Last Admin  . sodium chloride flush (NS) 0.9 % injection 3 mL  3 mL Intravenous Q12H Almyra Deforest, PA       No Known Allergies Social History   Socioeconomic History  . Marital status: Married    Spouse name: Not on file  . Number of children: Not on file  . Years of education: Not on file  . Highest education level: Not on file  Occupational History  . Occupation: RETIRED  Tobacco Use  . Smoking status: Former Smoker    Packs/day: 0.50    Years: 15.00    Pack years: 7.50    Types: Cigarettes    Quit date: 10/01/1988    Years since quitting: 31.2  . Smokeless tobacco: Never Used  Substance and Sexual Activity  . Alcohol use: No    Alcohol/week: 0.0 standard drinks  . Drug use: No  . Sexual activity: Yes    Comment: married, 2 grown sons, retired maintenance man  Other Topics Concern  . Not on file  Social History Narrative  . Not on file   Social Determinants of Health   Financial Resource Strain:   . Difficulty of Paying Living Expenses:   Food Insecurity:   . Worried  About Running Out of Food in the Last Year:   . [place] in the Last Year:   Transportation Needs:   . Lack of Transportation (Medical):   Marland Kitchen Lack of Transportation (Non-Medical):   Physical Activity:   . Days of Exercise per Week:   . Minutes of Exercise per Session:   Stress:   . Feeling of Stress :   Social Connections:   . Frequency of Communication with Friends and Family:   . Frequency of Social Gatherings  with Friends and Family:   . Attends Religious Services:   . Active Member of Clubs or Organizations:   . Attends Archivist Meetings:   Marland Kitchen Marital Status:   Intimate Partner Violence:   . Fear of Current or Ex-Partner:   . Emotionally Abused:   Marland Kitchen Physically Abused:   . Sexually Abused:    Family History  Problem Relation Age of Onset  . Diabetes Other   . Breast cancer Other   . Coronary artery disease Other       Review of Systems  All other systems reviewed and are negative.      Objective:   Physical Exam  Constitutional: He is oriented to person, place, and time. He appears well-developed and well-nourished. No distress.  HENT:  Head: Normocephalic and atraumatic.  Right Ear: External ear normal.  Left Ear: External ear normal.  Nose: Nose normal.  Mouth/Throat: Oropharynx is clear and moist. No oropharyngeal exudate.  Eyes: Pupils are equal, round, and reactive to light. Conjunctivae and EOM are normal. Right eye exhibits no discharge. Left eye exhibits no discharge. No scleral icterus.  Neck: No JVD present. No tracheal deviation present. No thyromegaly present.  Cardiovascular: Normal rate, regular rhythm, normal heart sounds and intact distal pulses. Exam reveals no gallop and no friction rub.  No murmur heard. Pulmonary/Chest: Effort normal and breath sounds normal. No stridor. No respiratory distress. He has no wheezes. He has no rales. He exhibits no tenderness.  Abdominal: Soft. Bowel sounds are normal. He exhibits no distension and no mass. There is no abdominal tenderness. There is no rebound and no guarding.  Musculoskeletal:        General: No tenderness or edema. Normal range of motion.     Cervical back: Normal range of motion and neck supple.  Lymphadenopathy:    He has no cervical adenopathy.  Neurological: He is alert and oriented to person, place, and time. He has normal reflexes. No cranial nerve deficit. He exhibits normal muscle tone.  Coordination normal.  Skin: Skin is warm. No rash noted. He is not diaphoretic. No erythema. No pallor.  Psychiatric: He has a normal mood and affect. His behavior is normal. Judgment and thought content normal.  Vitals reviewed.         Assessment & Plan:    Routine general medical examination at a health care facility  Screening for prostate cancer  Essential hypertension  Tubular adenoma of colon  ASCVD (arteriosclerotic cardiovascular disease)  Acute pain of left shoulder  Physical exam today is normal.  He does have depression but he is being treated for PTSD through the [place].  Blood pressure is excellent and lab work is outstanding.  PSA is normal.  Patient is not due for colonoscopy in 2023.  Immunizations are up-to-date.  Denies any falls or memory loss.  Suspect osteoarthritis and subacromial bursitis as a cause of his left shoulder pain.  Using sterile technique, I injected the left  subacromial space with 2 cc lidocaine, 2 cc of Marcaine, and 2 cc of 40 mg/mL Kenalog.  The patient tolerated the procedure well without complication.

## 2020-01-14 ENCOUNTER — Encounter: Payer: Self-pay | Admitting: Physician Assistant

## 2020-01-14 ENCOUNTER — Ambulatory Visit (INDEPENDENT_AMBULATORY_CARE_PROVIDER_SITE_OTHER): Payer: Medicare Other | Admitting: Physician Assistant

## 2020-01-14 ENCOUNTER — Other Ambulatory Visit: Payer: Self-pay

## 2020-01-14 VITALS — BP 112/58 | HR 53 | Temp 98.8°F | Ht 75.0 in | Wt 244.0 lb

## 2020-01-14 DIAGNOSIS — R600 Localized edema: Secondary | ICD-10-CM

## 2020-01-14 DIAGNOSIS — I10 Essential (primary) hypertension: Secondary | ICD-10-CM | POA: Diagnosis not present

## 2020-01-14 DIAGNOSIS — I340 Nonrheumatic mitral (valve) insufficiency: Secondary | ICD-10-CM | POA: Diagnosis not present

## 2020-01-14 DIAGNOSIS — G4733 Obstructive sleep apnea (adult) (pediatric): Secondary | ICD-10-CM

## 2020-01-14 DIAGNOSIS — E785 Hyperlipidemia, unspecified: Secondary | ICD-10-CM | POA: Diagnosis not present

## 2020-01-14 DIAGNOSIS — I2510 Atherosclerotic heart disease of native coronary artery without angina pectoris: Secondary | ICD-10-CM | POA: Diagnosis not present

## 2020-01-14 MED ORDER — FUROSEMIDE 20 MG PO TABS: 20.0000 mg | ORAL_TABLET | Freq: Every day | ORAL | 2 refills | Status: DC

## 2020-01-14 MED ORDER — AMLODIPINE BESYLATE 2.5 MG PO TABS: 2.5000 mg | ORAL_TABLET | Freq: Every day | ORAL | 3 refills | Status: DC

## 2020-01-14 NOTE — Progress Notes (Signed)
Cardiology Office Note:    Date:  01/16/2020   ID:  Gregory Suarez, DOB 22-Mar-1946, MRN [id number]  PCP:  [name], MD  Cardiologist:  Shelva Majestic, MD  Electrophysiologist:  None   Referring MD: [name], MD   Chief Complaint  Patient presents with  . Follow-up    seen for Dr. Claiborne Billings    History of Present Illness:    Gregory Suarez is a 74 y.o. male with a hx of CAD, hypertension, hyperlipidemia, mitral valve regurgitation, obstructive sleep apnea on BiPAP, restless leg syndrome.  Patient had a cardiac catheterization more than 20 years ago and was told he had mild blockage which medical therapy was recommended.  His obstructive sleep apnea is being followed by Dr. Humberto Seals.  Echocardiogram obtained in December 2017 showed EF 55 to 60%.  CT of chest obtained in 2020 demonstrated emphysema, aortic atherosclerosis and as well as extensive coronary calcification.  He subsequently had a coronary CT which came back severely abnormal.  As result, he underwent cardiac catheterization in May 2020 and was found to have diffuse multivessel CAD with 50-60 pressure proximal LAD, diffuse 50% in the first diagonal branch of LAD, mild irregularities in the distal LAD, 60% distal OM, 20% in the abdominal RCA, EF 55%.  Medical therapy was recommended.  He was last seen by Dr. Claiborne Billings in August 2020 at which time he was doing well.  Patient presents today along with his wife.  He continued to have worsening lower extremity edema.  He was unable to tolerate hydrochlorothiazide due to diarrhea.  I recommended decreasing his amlodipine to 2.5 mg daily.  I also started him on Lasix 20 mg daily x3 days before changing this to as needed.  He can follow-up in 1 month for reassessment.  Otherwise he denies any chest discomfort or significant shortness of breath.   Past Medical History:  Diagnosis Date  . CAD (coronary artery disease)   . Elevated PSA   . Erectile dysfunction   . GERD  (gastroesophageal reflux disease)   . Hyperlipidemia   . Hypertension   . Mitral valve regurgitation   . OSA treated with BiPAP   . RLS (restless legs syndrome)   . Tubular adenoma of colon     Past Surgical History:  Procedure Laterality Date  . ANKLE SURGERY    . CHOLECYSTECTOMY    . CHOLECYSTECTOMY    . LEFT HEART CATH AND CORONARY ANGIOGRAPHY N/A 02/13/2019   Procedure: LEFT HEART CATH AND CORONARY ANGIOGRAPHY;  Surgeon: Troy Sine, MD;  Location: [facility];  Service: Cardiovascular;  Laterality: N/A;    Current Medications: Current Meds  Medication Sig  . amLODipine (NORVASC) 2.5 MG tablet Take 1 tablet (2.5 mg total) by mouth daily.  Marland Kitchen aspirin EC 81 MG tablet Take 1 tablet (81 mg total) by mouth daily.  Marland Kitchen atorvastatin (LIPITOR) 80 MG tablet Take 1 tablet (80 mg total) by mouth daily.  . benzonatate (TESSALON) 100 MG capsule Take 1 capsule (100 mg total) by mouth 3 (three) times daily as needed for cough.  . chlorpheniramine (ALLERGY) 4 MG tablet Take 8 mg by mouth at bedtime.   . finasteride (PROSCAR) 5 MG tablet Take 1 tablet (5 mg total) by mouth daily.  . fluticasone (FLONASE) 50 MCG/ACT nasal spray Place 2 sprays into both nostrils daily. (Patient taking differently: Place 2 sprays into both nostrils daily as needed for allergies. )  . HYDROcodone-homatropine (HYCODAN) 5-1.5 MG/5ML syrup  Take 5 mLs by mouth every 8 (eight) hours as needed for cough.  . Melatonin 5 MG TABS Take 5 mg by mouth at bedtime.  . metoprolol tartrate (LOPRESSOR) 25 MG tablet TAKE 1 TABLET BY MOUTH TWICE A DAY  . Multiple Vitamin (MULTIVITAMIN WITH MINERALS) TABS tablet Take 1 tablet by mouth daily. AREDDS for eye health  . pantoprazole (PROTONIX) 40 MG tablet TAKE 1 TABLET BY MOUTH TWICE A DAY  . pramipexole (MIRAPEX) 0.25 MG tablet TAKE 2 TABLETS BY MOUTH EVERY DAY AT BEDTIME  . tadalafil (CIALIS) 5 MG tablet TAKE ONE TABLET BY MOUTH DAILY  . tiZANidine (ZANAFLEX) 4 MG tablet Take 1  tablet (4 mg total) by mouth every 8 (eight) hours as needed for muscle spasms.  . valACYclovir (VALTREX) 1000 MG tablet Take 1 tablet (1,000 mg total) by mouth daily. (Patient taking differently: Take 1,000 mg by mouth daily as needed. )  . [DISCONTINUED] amLODipine (NORVASC) 5 MG tablet Take 1 tablet (5 mg total) by mouth daily.  . [DISCONTINUED] hydrochlorothiazide (HYDRODIURIL) 12.5 MG tablet Take 12.5 mg by mouth daily.   Current Facility-Administered Medications for the 01/14/20 encounter (Office Visit) with Almyra Deforest, PA  Medication  . sodium chloride flush (NS) 0.9 % injection 3 mL     Allergies:   Patient has no known allergies.   Social History   Socioeconomic History  . Marital status: Married    Spouse name: Not on file  . Number of children: Not on file  . Years of education: Not on file  . Highest education level: Not on file  Occupational History  . Occupation: RETIRED  Tobacco Use  . Smoking status: Former Smoker    Packs/day: 0.50    Years: 15.00    Pack years: 7.50    Types: Cigarettes    Quit date: 10/01/1988    Years since quitting: 31.3  . Smokeless tobacco: Never Used  Substance and Sexual Activity  . Alcohol use: No    Alcohol/week: 0.0 standard drinks  . Drug use: No  . Sexual activity: Yes    Comment: married, 2 grown sons, retired maintenance man  Other Topics Concern  . Not on file  Social History Narrative  . Not on file   Social Determinants of Health   Financial Resource Strain:   . Difficulty of Paying Living Expenses:   Food Insecurity:   . Worried About Charity fundraiser in the Last Year:   . Arboriculturist in the Last Year:   Transportation Needs:   . Film/video editor (Medical):   Marland Kitchen Lack of Transportation (Non-Medical):   Physical Activity:   . Days of Exercise per Week:   . Minutes of Exercise per Session:   Stress:   . Feeling of Stress :   Social Connections:   . Frequency of Communication with Friends and Family:     . Frequency of Social Gatherings with Friends and Family:   . Attends Religious Services:   . Active Member of Clubs or Organizations:   . Attends Archivist Meetings:   Marland Kitchen Marital Status:      Family History: The patient's family history includes Breast cancer in an other family member; Coronary artery disease in an other family member; Diabetes in an other family member.  ROS:   Please see the history of present illness.     All other systems reviewed and are negative.  EKGs/Labs/Other Studies Reviewed:    The following  studies were reviewed today:  Cath 02/13/2019  Prox RCA lesion is 20% stenosed.  Prox RCA to Mid RCA lesion is 20% stenosed.  Mid RCA lesion is 20% stenosed.  1st Mrg lesion is 60% stenosed.  Prox LAD lesion is 55% stenosed.  Ost 1st Diag lesion is 50% stenosed.  The left ventricular systolic function is normal.   Diffuse multivessel coronary calcification 50 to 60% proximal LAD stenosis, diffuse 50% stenosis in the first diagonal branch of the LAD and mild irregularity in the mid distal LAD without significant stenoses; left circumflex vessel with a large OM1 vessel with smooth 60% mid distal OM stenosis proximal to a bifurcation; and mild irregularity with proximal to mid calcification of a dominant RCA with stenoses of approximately 20%.  Preserved LV contractility with EF estimated 55%; LVEDP 16 mmHg.  RECOMMENDATION: Initial medical therapy.  The patient had recently been started on low-dose beta-blocker therapy which may be able to be titrated as blood pressure and heart rate allow.  He was hypertensive initially.  We will add amlodipine 5 mg to his medical regimen.  Recommend aggressive lipid-lowering therapy with increase of atorvastatin to 80 mg in attempt to induce plaque regression.  EKG:  EKG is ordered today.  The ekg ordered today demonstrates sinus bradycardia, heart rate 53, otherwise no significant ST-T wave changes  Recent  Labs: 12/08/2019: ALT 15; BUN 12; Creat 0.94; Hemoglobin 14.5; Platelets 176; Potassium 4.3; Sodium 142  Recent Lipid Panel    Component Value Date/Time   CHOL 113 12/08/2019 0801   CHOL 102 04/22/2019 1022   TRIG 79 12/08/2019 0801   HDL 44 12/08/2019 0801   HDL 43 04/22/2019 1022   CHOLHDL 2.6 12/08/2019 0801   VLDL 18 02/07/2017 0809   LDLCALC 53 12/08/2019 0801    Physical Exam:    VS:  BP (!) 112/58   Pulse (!) 53   Temp 98.8 F (37.1 C)   Ht 6\' 3"  (1.905 m)   Wt 244 lb (110.7 kg)   BMI 30.50 kg/m     Wt Readings from Last 3 Encounters:  01/14/20 244 lb (110.7 kg)  12/10/19 241 lb (109.3 kg)  05/06/19 246 lb 6.4 oz (111.8 kg)     GEN:  Well nourished, well developed in no acute distress HEENT: Normal NECK: No JVD; No carotid bruits LYMPHATICS: No lymphadenopathy CARDIAC: RRR, no murmurs, rubs, gallops RESPIRATORY:  Clear to auscultation without rales, wheezing or rhonchi  ABDOMEN: Soft, non-tender, non-distended MUSCULOSKELETAL:  No edema; No deformity  SKIN: Warm and dry NEUROLOGIC:  Alert and oriented x 3 PSYCHIATRIC:  Normal affect   ASSESSMENT:    1. Leg edema   2. Coronary artery disease involving native coronary artery of native heart without angina pectoris   3. Essential hypertension   4. Hyperlipidemia LDL goal <70   5. Nonrheumatic mitral valve regurgitation   6. OSA (obstructive sleep apnea)    PLAN:    In order of problems listed above:  1. Leg edema: Likely has some degree of venous stasis, leg swelling worsen near the end of the day, and improve by morning time.  I recommended decreasing amlodipine to 2.5 mg daily.  I will start him on Lasix 20 mg daily for 3 days before changing it to as needed dose after that  2. CAD: Denies any chest pain.  Last cardiac catheterization in 2020 showed nonobstructive disease.  Medical therapy was recommended  3. Hypertension: Blood pressure stable  4. Hyperlipidemia: Continue  Lipitor, cholesterol very  well controlled  5. Mitral valve regurgitation: Stable on last echocardiogram  6. Obstructive sleep apnea: On BiPAP therapy.   Medication Adjustments/Labs and Tests Ordered: Current medicines are reviewed at length with the patient today.  Concerns regarding medicines are outlined above.  Orders Placed This Encounter  Procedures  . EKG 12-Lead   Meds ordered this encounter  Medications  . furosemide (LASIX) 20 MG tablet    Sig: Take 1 tablet (20 mg total) by mouth daily.    Dispense:  30 tablet    Refill:  2  . amLODipine (NORVASC) 2.5 MG tablet    Sig: Take 1 tablet (2.5 mg total) by mouth daily.    Dispense:  90 tablet    Refill:  3    Patient Instructions  Medication Instructions:   DECREASE Amlodipine to 2.5 mg daily  START Lasix 20 mg daily for 3 days then as needed for leg swelling  *If you need a refill on your cardiac medications before your next appointment, please call your pharmacy*  Lab Work: NONE ordered at this time of appointment   If you have labs (blood work) drawn today and your tests are completely normal, you will receive your results only by: Marland Kitchen MyChart Message (if you have MyChart) OR . A paper copy in the mail If you have any lab test that is abnormal or we need to change your treatment, we will call you to review the results.  Testing/Procedures: NONE ordered at this time of appointment   Follow-Up: At [facility], you and your health needs are our priority.  As part of our continuing mission to provide you with exceptional heart care, we have created designated Provider Care Teams.  These Care Teams include your primary Cardiologist (physician) and Advanced Practice Providers (APPs -  Physician Assistants and Nurse Practitioners) who all work together to provide you with the care you need, when you need it.   Your next appointment:   1 month(s)  The format for your next appointment:   In Person  Provider:   Almyra Deforest, PA-C  Other  Instructions      Signed, Almyra Deforest, [place]  01/16/2020 9:46 PM    [place]

## 2020-01-14 NOTE — Patient Instructions (Addendum)
Medication Instructions:   DECREASE Amlodipine to 2.5 mg daily  START Lasix 20 mg daily for 3 days then as needed for leg swelling  *If you need a refill on your cardiac medications before your next appointment, please call your pharmacy*  Lab Work: NONE ordered at this time of appointment   If you have labs (blood work) drawn today and your tests are completely normal, you will receive your results only by: Marland Kitchen MyChart Message (if you have MyChart) OR . A paper copy in the mail If you have any lab test that is abnormal or we need to change your treatment, we will call you to review the results.  Testing/Procedures: NONE ordered at this time of appointment   Follow-Up: At [facility], you and your health needs are our priority.  As part of our continuing mission to provide you with exceptional heart care, we have created designated Provider Care Teams.  These Care Teams include your primary Cardiologist (physician) and Advanced Practice Providers (APPs -  Physician Assistants and Nurse Practitioners) who all work together to provide you with the care you need, when you need it.   Your next appointment:   1 month(s)  The format for your next appointment:   In Person  Provider:   Almyra Deforest, PA-C  Other Instructions

## 2020-01-16 ENCOUNTER — Encounter: Payer: Self-pay | Admitting: Physician Assistant

## 2020-01-16 ENCOUNTER — Other Ambulatory Visit: Payer: Self-pay | Admitting: Physician Assistant

## 2020-01-16 ENCOUNTER — Other Ambulatory Visit: Payer: Self-pay | Admitting: Family Medicine

## 2020-01-19 NOTE — Telephone Encounter (Signed)
This is Dr. Evette Georges pt, this medication was not sent to pt's pharmacy.

## 2020-01-21 ENCOUNTER — Other Ambulatory Visit: Payer: Self-pay

## 2020-01-21 ENCOUNTER — Ambulatory Visit (INDEPENDENT_AMBULATORY_CARE_PROVIDER_SITE_OTHER): Payer: Medicare Other | Admitting: Dermatology

## 2020-01-21 ENCOUNTER — Encounter: Payer: Self-pay | Admitting: Dermatology

## 2020-01-21 DIAGNOSIS — Z86007 Personal history of in-situ neoplasm of skin: Secondary | ICD-10-CM

## 2020-01-21 DIAGNOSIS — L578 Other skin changes due to chronic exposure to nonionizing radiation: Secondary | ICD-10-CM | POA: Diagnosis not present

## 2020-01-21 DIAGNOSIS — D1800 Hemangioma unspecified site: Secondary | ICD-10-CM | POA: Diagnosis not present

## 2020-01-21 DIAGNOSIS — Z1283 Encounter for screening for malignant neoplasm of skin: Secondary | ICD-10-CM

## 2020-01-21 DIAGNOSIS — L814 Other melanin hyperpigmentation: Secondary | ICD-10-CM | POA: Diagnosis not present

## 2020-01-21 DIAGNOSIS — L570 Actinic keratosis: Secondary | ICD-10-CM | POA: Diagnosis not present

## 2020-01-21 DIAGNOSIS — L821 Other seborrheic keratosis: Secondary | ICD-10-CM

## 2020-01-21 DIAGNOSIS — D229 Melanocytic nevi, unspecified: Secondary | ICD-10-CM

## 2020-01-21 DIAGNOSIS — Z85828 Personal history of other malignant neoplasm of skin: Secondary | ICD-10-CM | POA: Diagnosis not present

## 2020-01-21 NOTE — Patient Instructions (Signed)

## 2020-01-21 NOTE — Progress Notes (Signed)
   Follow-Up Visit   Subjective  Gregory Suarez is a 74 y.o. male who presents for the following: Annual Exam (History of BCC/SCC insitu). Pt presents for skin cancer screening; mole check and total body skin exam.   The following portions of the chart were reviewed this encounter and updated as appropriate: Tobacco  Allergies  Meds  Problems  Med Hx  Surg Hx  Fam Hx      Review of Systems: No other skin or systemic complaints.  Objective  Well appearing patient in no apparent distress; mood and affect are within normal limits.  A full examination was performed including scalp, head, eyes, ears, nose, lips, neck, chest, axillae, abdomen, back, buttocks, bilateral upper extremities, bilateral lower extremities, hands, feet, fingers, toes, fingernails, and toenails. All findings within normal limits unless otherwise noted below.  Objective  Left Mid Helix: Erythematous thin papules/macules with gritty scale.   Objective  left temple, right cheek inf to zygoma: Well healed scar with no evidence of recurrence.   Objective  Right Temple: Well healed scar with no evidence of recurrence.   Assessment & Plan    Skin cancer screening performed today.  Actinic Damage - diffuse scaly erythematous macules with underlying dyspigmentation - Recommend daily broad spectrum sunscreen SPF 30+ to sun-exposed areas, reapply every 2 hours as needed.  - Call for new or changing lesions.  Seborrheic Keratoses - Stuck-on, waxy, tan-brown papules and plaques  - Discussed benign etiology and prognosis. - Observe - Call for any changes  Lentigines - Scattered tan macules - Discussed due to sun exposure - Benign, observe - Call for any changes  Acrochordons (Skin Tags) - Fleshy, skin-colored pedunculated papules - Benign appearing.  - Observe. - If desired, they can be removed with an in office procedure that is not covered by insurance. - Please call the clinic if you notice any new  or changing lesions.  Hemangiomas - Red papules - Discussed benign nature - Observe - Call for any changes  Melanocytic Nevi - Tan-brown and/or pink-flesh-colored symmetric macules and papules - Benign appearing on exam today - Observation - Call clinic for new or changing moles - Recommend daily use of broad spectrum spf 30+ sunscreen to sun-exposed areas.    AK (actinic keratosis) Left Mid Helix  Destruction of lesion - Left Mid Helix Complexity: simple   Destruction method: cryotherapy   Informed consent: discussed and consent obtained   Timeout:  patient name, date of birth, surgical site, and procedure verified Lesion destroyed using liquid nitrogen: Yes   Region frozen until ice ball extended beyond lesion: Yes   Outcome: patient tolerated procedure well with no complications   Post-procedure details: wound care instructions given    History of basal cell carcinoma (BCC) left temple, right cheek inf to zygoma  Clear today. 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 Observe   Return in about 1 year (around 01/20/2021).   I, Ashok Cordia, CMA, am acting as scribe for Sarina Ser, MD . Documentation: I have reviewed the above documentation for accuracy and completeness, and I agree with the above.  Sarina Ser, MD

## 2020-01-22 ENCOUNTER — Other Ambulatory Visit: Payer: Self-pay | Admitting: Physician Assistant

## 2020-01-22 DIAGNOSIS — Z0000 Encounter for general adult medical examination without abnormal findings: Secondary | ICD-10-CM

## 2020-01-25 ENCOUNTER — Encounter: Payer: Self-pay | Admitting: Dermatology

## 2020-01-25 ENCOUNTER — Other Ambulatory Visit: Payer: Self-pay | Admitting: *Deleted

## 2020-01-25 MED ORDER — PANTOPRAZOLE SODIUM 40 MG PO TBEC: 40.0000 mg | DELAYED_RELEASE_TABLET | Freq: Two times a day (BID) | ORAL | 3 refills | Status: DC

## 2020-02-09 ENCOUNTER — Other Ambulatory Visit: Payer: Self-pay | Admitting: Physician Assistant

## 2020-02-18 ENCOUNTER — Other Ambulatory Visit: Payer: Self-pay | Admitting: Family Medicine

## 2020-02-18 MED ORDER — FINASTERIDE 5 MG PO TABS: 5.0000 mg | ORAL_TABLET | Freq: Every day | ORAL | 3 refills | Status: DC

## 2020-02-26 ENCOUNTER — Encounter: Payer: Self-pay | Admitting: Physician Assistant

## 2020-02-26 ENCOUNTER — Ambulatory Visit (INDEPENDENT_AMBULATORY_CARE_PROVIDER_SITE_OTHER): Payer: Medicare Other | Admitting: Physician Assistant

## 2020-02-26 ENCOUNTER — Other Ambulatory Visit: Payer: Self-pay

## 2020-02-26 VITALS — BP 125/74 | HR 53 | Temp 98.2°F | Ht 75.0 in | Wt 246.4 lb

## 2020-02-26 DIAGNOSIS — R600 Localized edema: Secondary | ICD-10-CM

## 2020-02-26 DIAGNOSIS — G4733 Obstructive sleep apnea (adult) (pediatric): Secondary | ICD-10-CM | POA: Diagnosis not present

## 2020-02-26 DIAGNOSIS — I10 Essential (primary) hypertension: Secondary | ICD-10-CM

## 2020-02-26 DIAGNOSIS — E785 Hyperlipidemia, unspecified: Secondary | ICD-10-CM | POA: Diagnosis not present

## 2020-02-26 DIAGNOSIS — I2510 Atherosclerotic heart disease of native coronary artery without angina pectoris: Secondary | ICD-10-CM

## 2020-02-26 NOTE — Progress Notes (Signed)
Cardiology Office Note:    Date:  02/28/2020   ID:  Gregory Suarez, DOB 06/07/1946, MRN [id number]  PCP:  [name], MD  Cardiologist:  Shelva Majestic, MD  Electrophysiologist:  None   Referring MD: [name], MD   Chief Complaint  Patient presents with  . Follow-up    seen for Dr. Claiborne Billings.    History of Present Illness:    Gregory Suarez is a 74 y.o. male with a hx of CAD, hypertension, hyperlipidemia, mitral valve regurgitation, obstructive sleep apnea on BiPAP, restless leg syndrome.  Patient had a cardiac catheterization more than 20 years ago and was told he had mild blockage which medical therapy was recommended.  His obstructive sleep apnea is being followed by Dr. Humberto Seals.  Echocardiogram obtained in December 2017 showed EF 55 to 60%.  CT of chest obtained in 2020 demonstrated emphysema, aortic atherosclerosis and as well as extensive coronary calcification.  He subsequently had a coronary CT which came back severely abnormal.  As result, he underwent cardiac catheterization in May 2020 and was found to have diffuse multivessel CAD with 50-60% proximal LAD, diffuse 50% in the first diagonal branch of LAD, mild irregularities in the distal LAD, 60% distal OM, 20% in the abdominal RCA, EF 55%.  Medical therapy was recommended.  He was last seen by Dr. Claiborne Billings in August 2020 at which time he was doing well.  I last saw the patient on 01/14/2020, at which time he continued to have worsening lower extremity edema.  He was unable to tolerate hydrochlorothiazide due to diarrhea.  I recommended decreasing his amlodipine to 2.5 mg daily and started him on Lasix 20 mg daily for 3 days before changing this to as needed.  He presents today for follow-up.  Although he has not noticed significant urine output on the Lasix during the 3 days he was taking them, however he does not seem to have significant lower extremity edema anymore on exam.  He is lung is clear.  He denies any recent chest  pain.  I have recommended continue on the current therapy and pursue salt restriction and leg elevation.  Only if he has worsening leg edema despite leg elevation, then he can consider take Lasix on a as needed basis.  Otherwise I suspect some of his lower extremity edema is related to venous insufficiency as he noticed more edema near the end of the day.  He says he cannot do prolonged leg elevation due to uncomfortable feeling secondary to restless leg syndrome.   Past Medical History:  Diagnosis Date  . Basal cell carcinoma 06/28/2015   left temple  . Basal cell carcinoma 08/05/2017   right cheek inf to zygoma  . CAD (coronary artery disease)   . Elevated PSA   . Erectile dysfunction   . GERD (gastroesophageal reflux disease)   . Hyperlipidemia   . Hypertension   . Mitral valve regurgitation   . OSA treated with BiPAP   . RLS (restless legs syndrome)   . 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 Tubular adenoma of colon     Past Surgical History:  Procedure Laterality Date  . ANKLE SURGERY    . CHOLECYSTECTOMY    . CHOLECYSTECTOMY    . LEFT HEART CATH AND CORONARY ANGIOGRAPHY N/A 02/13/2019   Procedure: LEFT HEART CATH AND CORONARY ANGIOGRAPHY;  Surgeon: Troy Sine, MD;  Location: [facility];  Service: Cardiovascular;  Laterality: N/A;  Current Medications: Current Meds  Medication Sig  . amLODipine (NORVASC) 2.5 MG tablet Take 1 tablet (2.5 mg total) by mouth daily.  Marland Kitchen amLODipine (NORVASC) 2.5 MG tablet Take 1 tablet (2.5 mg total) by mouth daily.  Marland Kitchen aspirin EC 81 MG tablet Take 1 tablet (81 mg total) by mouth daily.  Marland Kitchen atorvastatin (LIPITOR) 80 MG tablet TAKE 1 TABLET BY MOUTH EVERY DAY  . benzonatate (TESSALON) 100 MG capsule Take 1 capsule (100 mg total) by mouth 3 (three) times daily as needed for cough.  . chlorpheniramine (ALLERGY) 4 MG tablet Take 8 mg by mouth at bedtime.   . finasteride (PROSCAR) 5 MG tablet Take 1 tablet (5 mg  total) by mouth daily.  . fluticasone (FLONASE) 50 MCG/ACT nasal spray Place 2 sprays into both nostrils daily. (Patient taking differently: Place 2 sprays into both nostrils daily as needed for allergies. )  . furosemide (LASIX) 20 MG tablet TAKE 1 TABLET BY MOUTH EVERY DAY  . HYDROcodone-homatropine (HYCODAN) 5-1.5 MG/5ML syrup Take 5 mLs by mouth every 8 (eight) hours as needed for cough.  . Melatonin 5 MG TABS Take 5 mg by mouth at bedtime.  . metoprolol tartrate (LOPRESSOR) 25 MG tablet TAKE 1 TABLET BY MOUTH TWICE A DAY  . Multiple Vitamin (MULTIVITAMIN WITH MINERALS) TABS tablet Take 1 tablet by mouth daily. AREDDS for eye health  . pantoprazole (PROTONIX) 40 MG tablet Take 1 tablet (40 mg total) by mouth 2 (two) times daily.  . pramipexole (MIRAPEX) 0.25 MG tablet TAKE 2 TABLETS BY MOUTH AT BEDTIME  . tadalafil (CIALIS) 5 MG tablet TAKE ONE TABLET BY MOUTH DAILY  . tiZANidine (ZANAFLEX) 4 MG tablet Take 1 tablet (4 mg total) by mouth every 8 (eight) hours as needed for muscle spasms.  . valACYclovir (VALTREX) 1000 MG tablet Take 1 tablet (1,000 mg total) by mouth daily. (Patient taking differently: Take 1,000 mg by mouth daily as needed. )   Current Facility-Administered Medications for the 02/26/20 encounter (Office Visit) with Almyra Deforest, PA  Medication  . sodium chloride flush (NS) 0.9 % injection 3 mL     Allergies:   Patient has no known allergies.   Social History   Socioeconomic History  . Marital status: Married    Spouse name: Not on file  . Number of children: Not on file  . Years of education: Not on file  . Highest education level: Not on file  Occupational History  . Occupation: RETIRED  Tobacco Use  . Smoking status: Former Smoker    Packs/day: 0.50    Years: 15.00    Pack years: 7.50    Types: Cigarettes    Quit date: 10/01/1988    Years since quitting: 31.4  . Smokeless tobacco: Never Used  Substance and Sexual Activity  . Alcohol use: No    Alcohol/week:  0.0 standard drinks  . Drug use: No  . Sexual activity: Yes    Comment: married, 2 grown sons, retired maintenance man  Other Topics Concern  . Not on file  Social History Narrative  . Not on file   Social Determinants of Health   Financial Resource Strain:   . Difficulty of Paying Living Expenses:   Food Insecurity:   . Worried About Charity fundraiser in the Last Year:   . Arboriculturist in the Last Year:   Transportation Needs:   . Film/video editor (Medical):   Marland Kitchen Lack of Transportation (Non-Medical):   Physical  Activity:   . Days of Exercise per Week:   . Minutes of Exercise per Session:   Stress:   . Feeling of Stress :   Social Connections:   . Frequency of Communication with Friends and Family:   . Frequency of Social Gatherings with Friends and Family:   . Attends Religious Services:   . Active Member of Clubs or Organizations:   . Attends Archivist Meetings:   Marland Kitchen Marital Status:      Family History: The patient's family history includes Breast cancer in an other family member; Coronary artery disease in an other family member; Diabetes in an other family member.  ROS:   Please see the history of present illness.     All other systems reviewed and are negative.  EKGs/Labs/Other Studies Reviewed:    The following studies were reviewed today:  Cath 02/13/2019  Prox RCA lesion is 20% stenosed.  Prox RCA to Mid RCA lesion is 20% stenosed.  Mid RCA lesion is 20% stenosed.  1st Mrg lesion is 60% stenosed.  Prox LAD lesion is 55% stenosed.  Ost 1st Diag lesion is 50% stenosed.  The left ventricular systolic function is normal.   Diffuse multivessel coronary calcification 50 to 60% proximal LAD stenosis, diffuse 50% stenosis in the first diagonal branch of the LAD and mild irregularity in the mid distal LAD without significant stenoses; left circumflex vessel with a large OM1 vessel with smooth 60% mid distal OM stenosis proximal to a  bifurcation; and mild irregularity with proximal to mid calcification of a dominant RCA with stenoses of approximately 20%.  Preserved LV contractility with EF estimated 55%; LVEDP 16 mmHg.  RECOMMENDATION: Initial medical therapy.  The patient had recently been started on low-dose beta-blocker therapy which may be able to be titrated as blood pressure and heart rate allow.  He was hypertensive initially.  We will add amlodipine 5 mg to his medical regimen.  Recommend aggressive lipid-lowering therapy with increase of atorvastatin to 80 mg in attempt to induce plaque regression.  EKG:  EKG is not ordered today.   Recent Labs: 12/08/2019: ALT 15; BUN 12; Creat 0.94; Hemoglobin 14.5; Platelets 176; Potassium 4.3; Sodium 142  Recent Lipid Panel    Component Value Date/Time   CHOL 113 12/08/2019 0801   CHOL 102 04/22/2019 1022   TRIG 79 12/08/2019 0801   HDL 44 12/08/2019 0801   HDL 43 04/22/2019 1022   CHOLHDL 2.6 12/08/2019 0801   VLDL 18 02/07/2017 0809   LDLCALC 53 12/08/2019 0801    Physical Exam:    VS:  BP 125/74   Pulse (!) 53   Temp 98.2 F (36.8 C)   Ht 6\' 3"  (1.905 m)   Wt 246 lb 6.4 oz (111.8 kg)   SpO2 97%   BMI 30.80 kg/m     Wt Readings from Last 3 Encounters:  02/26/20 246 lb 6.4 oz (111.8 kg)  01/14/20 244 lb (110.7 kg)  12/10/19 241 lb (109.3 kg)     GEN:  Well nourished, well developed in no acute distress HEENT: Normal NECK: No JVD; No carotid bruits LYMPHATICS: No lymphadenopathy CARDIAC: RRR, no murmurs, rubs, gallops RESPIRATORY:  Clear to auscultation without rales, wheezing or rhonchi  ABDOMEN: Soft, non-tender, non-distended MUSCULOSKELETAL:  No edema; No deformity  SKIN: Warm and dry NEUROLOGIC:  Alert and oriented x 3 PSYCHIATRIC:  Normal affect   ASSESSMENT:    1. Leg edema   2. Coronary artery disease involving native  coronary artery of native heart without angina pectoris   3. Essential hypertension   4. Hyperlipidemia LDL goal <70    5. OSA treated with BiPAP    PLAN:    In order of problems listed above:  1. Leg edema: I recommended as needed dose of Lasix for significant leg edema not relieved by leg elevation and salt restriction.  I suspect he has a component of venous stasis causing more leg edema near the end of the day.  I have given him a prescription of as needed dose of Lasix only if conservative management fails to control the leg edema.  After I reduced his amlodipine to 2.5 mg during the last office visit, I did not see significant leg edema on today's examination.  He did not notice significant urine output on the Lasix though.  2. CAD: Last cardiac catheterization was in May 2020.  He denies any chest pain  3. Hypertension: Continue on current therapy  4. Hyperlipidemia: Continue Lipitor  5. Obstructive sleep apnea: On BiPAP therapy   Medication Adjustments/Labs and Tests Ordered: Current medicines are reviewed at length with the patient today.  Concerns regarding medicines are outlined above.  No orders of the defined types were placed in this encounter.  No orders of the defined types were placed in this encounter.   Patient Instructions  Medication Instructions:  Please take furosemide as needed. *If you need a refill on your cardiac medications before your next appointment, please call your pharmacy*  Follow-Up: Your next appointment:  6 month(s) Please call our office 2 months in advance to schedule this appointment In Person with Shelva Majestic, MD  At [facility], you and your health needs are our priority.  As part of our continuing mission to provide you with exceptional heart care, we have created designated Provider Care Teams.  These Care Teams include your primary Cardiologist (physician) and Advanced Practice Providers (APPs -  Physician Assistants and Nurse Practitioners) who all work together to provide you with the care you need, when you need it.       Hilbert Corrigan,  [place]  02/28/2020 11:25 PM    [facility]

## 2020-02-26 NOTE — Patient Instructions (Addendum)
Medication Instructions:  Please take furosemide as needed. *If you need a refill on your cardiac medications before your next appointment, please call your pharmacy*  Follow-Up: Your next appointment:  6 month(s) Please call our office 2 months in advance to schedule this appointment In Person with Shelva Majestic, MD  At [facility], you and your health needs are our priority.  As part of our continuing mission to provide you with exceptional heart care, we have created designated Provider Care Teams.  These Care Teams include your primary Cardiologist (physician) and Advanced Practice Providers (APPs -  Physician Assistants and Nurse Practitioners) who all work together to provide you with the care you need, when you need it.

## 2020-02-28 ENCOUNTER — Encounter: Payer: Self-pay | Admitting: Physician Assistant

## 2020-03-21 ENCOUNTER — Telehealth: Payer: Self-pay | Admitting: Family Medicine

## 2020-03-21 NOTE — Progress Notes (Signed)
  Chronic Care Management   Note  03/21/2020 Name: Gregory Suarez MRN: [id number] DOB: [birth year]  Gregory Suarez is a 74 y.o. year old male who is a primary care patient of [name], [name], MD. I reached out to [name] by phone today in response to a referral sent by Gregory Suarez PCP, [name], MD.   Gregory Suarez was given information about Chronic Care Management services today including:  1. CCM service includes personalized support from designated clinical staff supervised by his physician, including individualized plan of care and coordination with other care providers 2. 24/7 contact phone numbers for assistance for urgent and routine care needs. 3. Service will only be billed when office clinical staff spend 20 minutes or more in a month to coordinate care. 4. Only one practitioner may furnish and bill the service in a calendar month. 5. The patient may stop CCM services at any time (effective at the end of the month) by phone call to the office staff.   Patient agreed to services and verbal consent obtained.   Follow up plan:   Gregory Suarez

## 2020-03-28 ENCOUNTER — Emergency Department (HOSPITAL_COMMUNITY): Payer: Medicare Other

## 2020-03-28 ENCOUNTER — Other Ambulatory Visit: Payer: Self-pay

## 2020-03-28 ENCOUNTER — Ambulatory Visit (INDEPENDENT_AMBULATORY_CARE_PROVIDER_SITE_OTHER): Payer: Medicare Other | Admitting: Nurse Practitioner

## 2020-03-28 ENCOUNTER — Encounter (HOSPITAL_COMMUNITY): Payer: Self-pay | Admitting: *Deleted

## 2020-03-28 ENCOUNTER — Inpatient Hospital Stay (HOSPITAL_COMMUNITY)
Admission: EM | Admit: 2020-03-28 | Discharge: 2020-03-30 | DRG: 200 | Disposition: A | Discharge status: Home / Self Care | Payer: Medicare Other | Attending: Family Medicine | Admitting: Family Medicine

## 2020-03-28 VITALS — BP 120/68 | HR 85 | Temp 98.2°F | Resp 18 | Wt 249.0 lb

## 2020-03-28 DIAGNOSIS — G2581 Restless legs syndrome: Secondary | ICD-10-CM | POA: Diagnosis present

## 2020-03-28 DIAGNOSIS — I2510 Atherosclerotic heart disease of native coronary artery without angina pectoris: Secondary | ICD-10-CM | POA: Diagnosis present

## 2020-03-28 DIAGNOSIS — E785 Hyperlipidemia, unspecified: Secondary | ICD-10-CM | POA: Diagnosis present

## 2020-03-28 DIAGNOSIS — Z7982 Long term (current) use of aspirin: Secondary | ICD-10-CM

## 2020-03-28 DIAGNOSIS — K219 Gastro-esophageal reflux disease without esophagitis: Secondary | ICD-10-CM | POA: Diagnosis present

## 2020-03-28 DIAGNOSIS — N529 Male erectile dysfunction, unspecified: Secondary | ICD-10-CM | POA: Diagnosis present

## 2020-03-28 DIAGNOSIS — J449 Chronic obstructive pulmonary disease, unspecified: Secondary | ICD-10-CM | POA: Diagnosis not present

## 2020-03-28 DIAGNOSIS — I340 Nonrheumatic mitral (valve) insufficiency: Secondary | ICD-10-CM | POA: Diagnosis present

## 2020-03-28 DIAGNOSIS — I5032 Chronic diastolic (congestive) heart failure: Secondary | ICD-10-CM | POA: Diagnosis present

## 2020-03-28 DIAGNOSIS — R0789 Other chest pain: Secondary | ICD-10-CM

## 2020-03-28 DIAGNOSIS — Z85828 Personal history of other malignant neoplasm of skin: Secondary | ICD-10-CM | POA: Diagnosis not present

## 2020-03-28 DIAGNOSIS — J9383 Other pneumothorax: Secondary | ICD-10-CM | POA: Diagnosis not present

## 2020-03-28 DIAGNOSIS — Z79899 Other long term (current) drug therapy: Secondary | ICD-10-CM | POA: Diagnosis not present

## 2020-03-28 DIAGNOSIS — N179 Acute kidney failure, unspecified: Secondary | ICD-10-CM | POA: Diagnosis present

## 2020-03-28 DIAGNOSIS — I10 Essential (primary) hypertension: Secondary | ICD-10-CM | POA: Diagnosis not present

## 2020-03-28 DIAGNOSIS — I110 Hypertensive heart disease with heart failure: Secondary | ICD-10-CM | POA: Diagnosis present

## 2020-03-28 DIAGNOSIS — G4733 Obstructive sleep apnea (adult) (pediatric): Secondary | ICD-10-CM | POA: Diagnosis present

## 2020-03-28 DIAGNOSIS — Z8249 Family history of ischemic heart disease and other diseases of the circulatory system: Secondary | ICD-10-CM

## 2020-03-28 DIAGNOSIS — Z20822 Contact with and (suspected) exposure to covid-19: Secondary | ICD-10-CM | POA: Diagnosis present

## 2020-03-28 DIAGNOSIS — R079 Chest pain, unspecified: Secondary | ICD-10-CM | POA: Diagnosis present

## 2020-03-28 DIAGNOSIS — E669 Obesity, unspecified: Secondary | ICD-10-CM | POA: Diagnosis present

## 2020-03-28 DIAGNOSIS — J439 Emphysema, unspecified: Secondary | ICD-10-CM | POA: Diagnosis present

## 2020-03-28 DIAGNOSIS — R0602 Shortness of breath: Secondary | ICD-10-CM | POA: Diagnosis not present

## 2020-03-28 DIAGNOSIS — Z87891 Personal history of nicotine dependence: Secondary | ICD-10-CM

## 2020-03-28 DIAGNOSIS — Z6831 Body mass index (BMI) 31.0-31.9, adult: Secondary | ICD-10-CM

## 2020-03-28 DIAGNOSIS — J939 Pneumothorax, unspecified: Secondary | ICD-10-CM

## 2020-03-28 DIAGNOSIS — R0902 Hypoxemia: Secondary | ICD-10-CM | POA: Diagnosis present

## 2020-03-28 LAB — CBC
HCT: 46.6 % (ref 39.0–52.0)
Hemoglobin: 15.1 g/dL (ref 13.0–17.0)
MCH: 31 pg (ref 26.0–34.0)
MCHC: 32.4 g/dL (ref 30.0–36.0)
MCV: 95.7 fL (ref 80.0–100.0)
Platelets: 186 10*3/uL (ref 150–400)
RBC: 4.87 MIL/uL (ref 4.22–5.81)
RDW: 13.3 % (ref 11.5–15.5)
WBC: 9.5 10*3/uL (ref 4.0–10.5)
nRBC: 0 % (ref 0.0–0.2)

## 2020-03-28 LAB — BASIC METABOLIC PANEL
Anion gap: 10 (ref 5–15)
BUN: 17 mg/dL (ref 8–23)
CO2: 25 mmol/L (ref 22–32)
Calcium: 8.9 mg/dL (ref 8.9–10.3)
Chloride: 107 mmol/L (ref 98–111)
Creatinine, Ser: 1.33 mg/dL — ABNORMAL HIGH (ref 0.61–1.24)
GFR calc Af Amer: >60 mL/min (ref >60)
GFR calc non Af Amer: 53 mL/min — ABNORMAL LOW (ref >60)
Glucose, Bld: 103 mg/dL — ABNORMAL HIGH (ref 70–99)
Potassium: 3.7 mmol/L (ref 3.5–5.1)
Sodium: 142 mmol/L (ref 135–145)

## 2020-03-28 LAB — TROPONIN I (HIGH SENSITIVITY)
Troponin I (High Sensitivity): 7 ng/L (ref <18)
Troponin I (High Sensitivity): 8 ng/L (ref <18)

## 2020-03-28 LAB — SARS CORONAVIRUS 2 BY RT PCR (HOSPITAL ORDER, PERFORMED IN ~~LOC~~ HOSPITAL LAB): SARS Coronavirus 2: NEGATIVE

## 2020-03-28 MED ORDER — MELATONIN 5 MG PO TABS
5.0000 mg | ORAL_TABLET | Freq: Every day | ORAL | Status: DC
  Administered 2020-03-28 – 2020-03-29 (×2): 5 mg via ORAL
  Filled 2020-03-28 (×2): qty 1

## 2020-03-28 MED ORDER — ATORVASTATIN CALCIUM 80 MG PO TABS
80.0000 mg | ORAL_TABLET | Freq: Every day | ORAL | Status: DC
  Administered 2020-03-29: 80 mg via ORAL
  Filled 2020-03-28: qty 1

## 2020-03-28 MED ORDER — PRAMIPEXOLE DIHYDROCHLORIDE 0.25 MG PO TABS
0.5000 mg | ORAL_TABLET | Freq: Every day | ORAL | Status: DC
  Administered 2020-03-29: 0.5 mg via ORAL
  Filled 2020-03-28 (×3): qty 2

## 2020-03-28 MED ORDER — SODIUM CHLORIDE 0.9% FLUSH: 3.0000 mL | Freq: Once | INTRAVENOUS | Status: DC

## 2020-03-28 MED ORDER — ADULT MULTIVITAMIN W/MINERALS CH
1.0000 | ORAL_TABLET | Freq: Every day | ORAL | Status: DC
  Administered 2020-03-30: 1 via ORAL
  Filled 2020-03-28: qty 1

## 2020-03-28 MED ORDER — SODIUM CHLORIDE 0.9 % IV SOLN: INTRAVENOUS | Status: DC

## 2020-03-28 MED ORDER — FINASTERIDE 5 MG PO TABS: 5.0000 mg | ORAL_TABLET | Freq: Every day | ORAL | Status: DC

## 2020-03-28 MED ORDER — METOPROLOL TARTRATE 25 MG PO TABS
25.0000 mg | ORAL_TABLET | Freq: Two times a day (BID) | ORAL | Status: DC
  Administered 2020-03-29 – 2020-03-30 (×3): 25 mg via ORAL
  Filled 2020-03-28 (×4): qty 1

## 2020-03-28 MED ORDER — TIZANIDINE HCL 4 MG PO TABS: 4.0000 mg | ORAL_TABLET | Freq: Three times a day (TID) | ORAL | Status: DC | PRN

## 2020-03-28 MED ORDER — LORAZEPAM 2 MG/ML IJ SOLN
1.0000 mg | Freq: Once | INTRAMUSCULAR | Status: AC
  Administered 2020-03-28: 1 mg via INTRAVENOUS
  Filled 2020-03-28: qty 1

## 2020-03-28 MED ORDER — PANTOPRAZOLE SODIUM 40 MG PO TBEC
40.0000 mg | DELAYED_RELEASE_TABLET | Freq: Two times a day (BID) | ORAL | Status: DC
  Administered 2020-03-28 – 2020-03-30 (×4): 40 mg via ORAL
  Filled 2020-03-28 (×4): qty 1

## 2020-03-28 MED ORDER — AMLODIPINE BESYLATE 2.5 MG PO TABS
2.5000 mg | ORAL_TABLET | Freq: Every day | ORAL | Status: DC
  Administered 2020-03-29: 2.5 mg via ORAL
  Filled 2020-03-28: qty 1

## 2020-03-28 MED ORDER — FUROSEMIDE 20 MG PO TABS: 20.0000 mg | ORAL_TABLET | Freq: Every day | ORAL | Status: DC

## 2020-03-28 MED ORDER — MORPHINE SULFATE (PF) 4 MG/ML IV SOLN
4.0000 mg | Freq: Once | INTRAVENOUS | Status: AC
  Administered 2020-03-28: 4 mg via INTRAVENOUS
  Filled 2020-03-28: qty 1

## 2020-03-28 MED ORDER — ASPIRIN EC 81 MG PO TBEC
81.0000 mg | DELAYED_RELEASE_TABLET | Freq: Every day | ORAL | Status: DC
  Administered 2020-03-29 – 2020-03-30 (×2): 81 mg via ORAL
  Filled 2020-03-28 (×2): qty 1

## 2020-03-28 NOTE — ED Triage Notes (Signed)
To ED for eval after seeing pcp today due to sob and cp. Told to come to the ED due to abnormal ECG. Pt sounds winded during triage eval. Chest feels tight per pt. Full ASA taken prior to arrival. No nausea or vomiting. Pt states he did feel this way in the past but didn't go see anyone for it.

## 2020-03-28 NOTE — Progress Notes (Signed)
Patient not placed on CPAP at this time due to pneumothorax, per MD.

## 2020-03-28 NOTE — H&P (Signed)
Gregory Suarez LEX:[id number] DOB: 1945/12/03 DOA: 03/28/2020     PCP: [name], MD   Outpatient Specialists:  CARDS: Dr. Claiborne Billings   Pulmonary  Dr. Gwenette Greet    Patient arrived to ER on 03/28/20 at 1725 Referred by Attending Virgel Manifold, MD   Patient coming from: home Lives   With family   Chief Complaint:   Chief Complaint  Patient presents with  . Chest Pain  . Shortness of Breath    HPI: Gregory Suarez is a 74 y.o. male with medical history significant of  COPD and sleep apnea on CPAP, CAD, GERD, HLD, HTN, restless leg syndrome    Presented with sharp pain in the right side of the chest . Pain woke him up from sleep. He never had this before felt like he cannot catch breath no fevers no cough   Patient went to his PCP who send him to ER    No fever mild  cough No longer smokes Infectious risk factors:  Reports  shortness of breath    Has   been vaccinated against COVID    Initial COVID TEST  NEGATIVE    Lab Results  Component Value Date   [place] 03/28/2020   [place] Not Detected 04/23/2019   [place] NOT DETECTED 02/10/2019   Regarding pertinent Chronic problems:   Hyperlipidemia - on statins Lipitor Lipid Panel     Component Value Date/Time   CHOL 113 12/08/2019 0801   CHOL 102 04/22/2019 1022   TRIG 79 12/08/2019 0801   HDL 44 12/08/2019 0801   HDL 43 04/22/2019 1022   CHOLHDL 2.6 12/08/2019 0801   VLDL 18 02/07/2017 0809   LDLCALC 53 12/08/2019 0801   LABVLDL 15 04/22/2019 1022    HTN on Norvasc metoprolol   chronic CHF diastolic  - last echo 4496 Lasix   CAD  - On Aspirin, statin, betablocker,                  -  followed by cardiology       obesity-   BMI Readings from Last 1 Encounters:  03/28/20 31.12 kg/m    COPD - not  followed by pulmonology   not  on baseline oxygen       OSA -on nocturna CPAP        While in ER: Chest x-ray showing pneumothorax right chest tube inserted in emergency  department Lung appeared to reexpand on chest x-ray Pulmonology called will see patient in consult  ER Provider Called:    PCCM They Recommend admit to medicine   Will see in AM   No persistent leak Will change to water seal  Hospitalist was called for admission for pneumothorax spontaneous  The following Work up has been ordered so far:  Orders Placed This Encounter  Procedures  . CHEST TUBE INSERTION  . SARS Coronavirus 2 by RT PCR (hospital order, performed in [facility] hospital lab) Nasopharyngeal Nasopharyngeal Swab  . DG Chest 2 View  . DG Chest Portable 1 View  . Basic metabolic panel  . CBC  . Cardiac monitoring  . Document Height and Actual Weight  . Saline Lock IV, Maintain IV access  . Consult to intensivist  ALL PATIENTS BEING ADMITTED/HAVING PROCEDURES NEED COVID-19 SCREENING  . Consult to hospitalist  ALL PATIENTS BEING ADMITTED/HAVING PROCEDURES NEED COVID-19 SCREENING  . Consult to hospitalist  ALL PATIENTS BEING ADMITTED/HAVING PROCEDURES NEED COVID-19 SCREENING  . Pulse oximetry, continuous  . EKG 12-Lead  .  ED EKG  . Saline lock IV   Following Medications were ordered in ER: Medications  sodium chloride flush (NS) 0.9 % injection 3 mL (3 mLs Intravenous Not Given 03/28/20 1900)  aspirin EC tablet 81 mg (has no administration in time range)  amLODipine (NORVASC) tablet 2.5 mg (has no administration in time range)  finasteride (PROSCAR) tablet 5 mg (has no administration in time range)  furosemide (LASIX) tablet 20 mg (has no administration in time range)  melatonin tablet 5 mg (has no administration in time range)  metoprolol tartrate (LOPRESSOR) tablet 25 mg (has no administration in time range)  pantoprazole (PROTONIX) EC tablet 40 mg (has no administration in time range)  pramipexole (MIRAPEX) tablet 0.5 mg (has no administration in time range)  tiZANidine (ZANAFLEX) tablet 4 mg (has no administration in time range)  multivitamin with minerals  tablet 1 tablet (has no administration in time range)  atorvastatin (LIPITOR) tablet 80 mg (has no administration in time range)  LORazepam (ATIVAN) injection 1 mg (1 mg Intravenous Given 03/28/20 1914)  morphine 4 MG/ML injection 4 mg (4 mg Intravenous Given 03/28/20 2009)        Consult Orders  (From admission, onward)         Start     Ordered   03/28/20 2108  Consult to hospitalist  ALL PATIENTS BEING ADMITTED/HAVING PROCEDURES NEED COVID-19 SCREENING  Once       Comments: ALL PATIENTS BEING ADMITTED/HAVING PROCEDURES NEED COVID-19 SCREENING  Provider:  (Not yet assigned)  Question Answer Comment  Place call to: [facility]   Reason for Consult Admit      03/28/20 2107   03/28/20 2021  Consult to hospitalist  ALL PATIENTS BEING ADMITTED/HAVING PROCEDURES NEED COVID-19 SCREENING Paged by Starleen Blue  Once       Comments: ALL PATIENTS BEING ADMITTED/HAVING PROCEDURES NEED COVID-19 SCREENING  Provider:  (Not yet assigned)  Question Answer Comment  Place call to: [facility]   Reason for Consult Admit      03/28/20 2020         Significant initial  Findings: Abnormal Labs Reviewed  BASIC METABOLIC PANEL - Abnormal; Notable for the following components:      Result Value   Glucose, Bld 103 (*)    Creatinine, Ser 1.33 (*)    GFR calc non Af Amer 53 (*)    All other components within normal limits   Otherwise labs showing:   Recent Labs  Lab 03/28/20 1811  NA 142  K 3.7  CO2 25  GLUCOSE 103*  BUN 17  CREATININE 1.33*  CALCIUM 8.9    Cr stable,   Lab Results  Component Value Date   CREATININE 1.33 (H) 03/28/2020   CREATININE 0.94 12/08/2019   CREATININE 0.99 04/22/2019    No results for input(s): AST, ALT, ALKPHOS, BILITOT, PROT, ALBUMIN in the last 168 hours. Lab Results  Component Value Date   CALCIUM 8.9 03/28/2020     WBC      Component Value Date/Time   WBC 9.5 03/28/2020 1811   ANC    Component Value Date/Time   NEUTROABS 4,024  12/08/2019 0801   NEUTROABS 4.1 02/10/2019 0834   ALC No components found for: LYMPHAB    Plt: Lab Results  Component Value Date   PLT 186 03/28/2020       COVID-19 Labs  No results for input(s): DDIMER, FERRITIN, LDH, CRP in the last 72 hours.  Lab Results  Component Value Date  [place] NEGATIVE 03/28/2020   [place] Not Detected 04/23/2019   SARSCOV2NAA NOT DETECTED 02/10/2019     HG/HCT stable,       Component Value Date/Time   HGB 15.1 03/28/2020 1811   HGB 14.1 02/10/2019 0834   HCT 46.6 03/28/2020 1811   HCT 40.6 02/10/2019 0834     Troponin 7      ECG: Ordered Personally reviewed by me showing: HR : 67 Rhythm:  NSR,    nonspecific changes  QTC 412    UA  not ordered   Ordered    CXR - Large, approximately 50% right pneumothorax Repeat CXR sp right chest tube placement    ED Triage Vitals  Enc Vitals Group     BP 03/28/20 1738 (!) 151/80     Pulse Rate 03/28/20 1738 76     Resp 03/28/20 1738 20     Temp 03/28/20 1738 98 F (36.7 C)     Temp Source 03/28/20 1738 Oral     SpO2 03/28/20 1738 92 %     Weight --      Height --      Head Circumference --      Peak Flow --      Pain Score 03/28/20 1802 2     Pain Loc --      Pain Edu? --      Excl. in [place]? --   TMAX(24)@       Latest  Blood pressure (!) 142/82, pulse 78, temperature 98 F (36.7 C), temperature source Oral, resp. rate 20, SpO2 98 %.    Review of Systems:    Pertinent positives include:  shortness of breath at rest. chest pain  Constitutional:  No weight loss, night sweats, Fevers, chills, fatigue, weight loss  HEENT:  No headaches, Difficulty swallowing,Tooth/dental problems,Sore throat,  No sneezing, itching, ear ache, nasal congestion, post nasal drip,  Cardio-vascular:  No , Orthopnea, PND, anasarca, dizziness, palpitations.no Bilateral lower extremity swelling  GI:  No heartburn, indigestion, abdominal pain, nausea, vomiting, diarrhea, change in bowel  habits, loss of appetite, melena, blood in stool, hematemesis Resp:  no  No dyspnea on exertion, No excess mucus, no productive cough, No non-productive cough, No coughing up of blood.No change in color of mucus.No wheezing. Skin:  no rash or lesions. No jaundice GU:  no dysuria, change in color of urine, no urgency or frequency. No straining to urinate.  No flank pain.  Musculoskeletal:  No joint pain or no joint swelling. No decreased range of motion. No back pain.  Psych:  No change in mood or affect. No depression or anxiety. No memory loss.  Neuro: no localizing neurological complaints, no tingling, no weakness, no double vision, no gait abnormality, no slurred speech, no confusion  All systems reviewed and apart from [place] all are negative  Past Medical History:   Past Medical History:  Diagnosis Date  . Basal cell carcinoma 06/28/2015   left temple  . Basal cell carcinoma 08/05/2017   right cheek inf to zygoma  . CAD (coronary artery disease)   . Elevated PSA   . Erectile dysfunction   . GERD (gastroesophageal reflux disease)   . Hyperlipidemia   . Hypertension   . Mitral valve regurgitation   . OSA treated with BiPAP   . RLS (restless legs syndrome)   . 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 Tubular adenoma of colon      Past Surgical History:  Procedure Laterality Date  . ANKLE SURGERY    . CHOLECYSTECTOMY    . CHOLECYSTECTOMY    . LEFT HEART CATH AND CORONARY ANGIOGRAPHY N/A 02/13/2019   Procedure: LEFT HEART CATH AND CORONARY ANGIOGRAPHY;  Surgeon: Troy Sine, MD;  Location: [facility];  Service: Cardiovascular;  Laterality: N/A;    Social History:  Ambulatory  independently      reports that he quit smoking about 31 years ago. His smoking use included cigarettes. He has a 7.50 pack-year smoking history. He has never used smokeless tobacco. He reports that he does not drink alcohol and does not use drugs.  Family  History:   Family History  Problem Relation Age of Onset  . Diabetes Other   . Breast cancer Other   . Coronary artery disease Other     Allergies: No Known Allergies   Prior to Admission medications   Medication Sig Start Date End Date Taking? Authorizing Provider  amLODipine (NORVASC) 2.5 MG tablet Take 1 tablet (2.5 mg total) by mouth daily. 01/19/20   Almyra Deforest, PA  aspirin EC 81 MG tablet Take 1 tablet (81 mg total) by mouth daily. 11/12/18   Troy Sine, MD  atorvastatin (LIPITOR) 80 MG tablet TAKE 1 TABLET BY MOUTH EVERY DAY 01/22/20   Bhagat, Bhavinkumar, PA  benzonatate (TESSALON) 100 MG capsule Take 1 capsule (100 mg total) by mouth 3 (three) times daily as needed for cough. 10/30/18   Delsa Grana, PA-C  chlorpheniramine (ALLERGY) 4 MG tablet Take 8 mg by mouth at bedtime.     [provider]  finasteride (PROSCAR) 5 MG tablet Take 1 tablet (5 mg total) by mouth daily. 02/18/20   [name], MD  fluticasone (FLONASE) 50 MCG/ACT nasal spray Place 2 sprays into both nostrils daily. Patient taking differently: Place 2 sprays into both nostrils daily as needed for allergies.  07/18/18   Delsa Grana, PA-C  furosemide (LASIX) 20 MG tablet TAKE 1 TABLET BY MOUTH EVERY DAY 02/10/20   Almyra Deforest, PA  HYDROcodone-homatropine [facility]) 5-1.5 MG/5ML syrup Take 5 mLs by mouth every 8 (eight) hours as needed for cough. 11/11/18   [name], MD  Melatonin 5 MG TABS Take 5 mg by mouth at bedtime.    [provider]  metoprolol tartrate (LOPRESSOR) 25 MG tablet TAKE 1 TABLET BY MOUTH TWICE A DAY 11/30/19   [name], MD  Multiple Vitamin (MULTIVITAMIN WITH MINERALS) TABS tablet Take 1 tablet by mouth daily. [place]EDDS for eye health    [provider]  pantoprazole (PROTONIX) 40 MG tablet Take 1 tablet (40 mg total) by mouth 2 (two) times daily. 01/25/20   [name], MD  pramipexole (MIRAPEX) 0.25 MG tablet TAKE 2 TABLETS BY MOUTH AT BEDTIME  01/18/20   [name], MD  tadalafil (CIALIS) 5 MG tablet TAKE ONE TABLET BY MOUTH DAILY 11/25/19   [name], MD  tiZANidine (ZANAFLEX) 4 MG tablet Take 1 tablet (4 mg total) by mouth every 8 (eight) hours as needed for muscle spasms. 11/11/18   [name], MD  valACYclovir (VALTREX) 1000 MG tablet Take 1 tablet (1,000 mg total) by mouth daily. Patient taking differently: Take 1,000 mg by mouth daily as needed.  03/11/15   Alycia Rossetti, MD   Physical Exam: Vitals with BMI 03/28/2020 03/28/2020 03/28/2020  Height - - -  Weight - - -  BMI - - -  Systolic 025 427  952  Diastolic 82 80 93  Pulse 78 80 79     1. General:  in No  Acute distress   Chronically ill -appearing 2. Psychological: Alert and  Oriented 3. Head/ENT:    Dry Mucous Membranes                          Head Non traumatic, neck supple                           Poor Dentition 4. SKIN: normal  Skin turgor,  Skin clean Dry and intact no rash 5. Heart: Regular rate and rhythm no  Murmur, no Rub or gallop, right chest tube in place 6. Lungs: no wheezes or crackles   7. Abdomen: Soft,   non-tender, Non distended   bowel sounds present 8. Lower extremities: no clubbing, cyanosis, no  edema 9. Neurologically Grossly intact, moving all 4 extremities equally  10. MSK: Normal range of motion   All other LABS:     Recent Labs  Lab 03/28/20 1811  WBC 9.5  HGB 15.1  HCT 46.6  MCV 95.7  PLT 186     Recent Labs  Lab 03/28/20 1811  NA 142  K 3.7  CL 107  CO2 25  GLUCOSE 103*  BUN 17  CREATININE 1.33*  CALCIUM 8.9     No results for input(s): AST, ALT, ALKPHOS, BILITOT, PROT, ALBUMIN in the last 168 hours.     Cultures: No results found for: SDES, [place], CULT, REPTSTATUS   Radiological Exams on Admission: DG Chest 2 View  Result Date: 03/28/2020 CLINICAL DATA:  Chest pain, shortness of breath EXAM: CHEST - 2 VIEW COMPARISON:  10/30/2018 FINDINGS: There is a large right  pneumothorax laterally, approximately 50% in size. Atelectasis throughout the compressed right lung. No confluent opacity on the left. Emphysema. Heart is normal size. No acute bony abnormality. IMPRESSION: Large, approximately 50% right pneumothorax. No evidence of tension. Emphysema. Critical Value/emergent results were called by telephone at the time of interpretation on 03/28/2020 at 6:48 pm to provider Dr. Roslynn Amble , who verbally acknowledged these results. Electronically Signed   By: Rolm Baptise M.D.   On: 03/28/2020 18:50   DG Chest Portable 1 View  Result Date: 03/28/2020 CLINICAL DATA:  Chest tube placement EXAM: PORTABLE CHEST 1 VIEW COMPARISON:  03/28/2020 FINDINGS: Interval placement of right chest tube. Re-expansion of the right lung. No visible residual pneumothorax. Right base atelectasis. Left lung clear. Heart is normal size. IMPRESSION: Interval placement of right chest tube with re-expansion of the right lung. No visible residual pneumothorax. Right base atelectasis. Electronically Signed   By: Rolm Baptise M.D.   On: 03/28/2020 20:08    Chart has been reviewed    Assessment/Plan  74 y.o. male with medical history significant of  COPD and sleep apnea on CPAP, CAD, GERD, HLD, HTN, restless leg syndrome Admitted for spontaneous pneumothorax requiring chest tube  Present on Admission: . Spontaneous pneumothorax -continue chest tube on waterseal  Appreciate PCCM consult  . GERD (gastroesophageal reflux disease) -chronic stable continue home medication  . CAD (coronary artery disease) continue statin and betablocker  . Hypertension -continue home medications   . Hyperlipidemia -stable continue medication  . RLS (restless legs syndrome) continue home meds  . COPD (chronic obstructive pulmonary disease) (HCC) likely cause of sponatanous Pneumothorax Not taking any homemeds currently stable  OSA hold CPAP  AKI -  obtain urinary lites gently rehydrate hold Lasix for  tonight  Other plan as per orders.  DVT prophylaxis:  SCD      Code Status:    Code Status: Prior FULL CODE  as per patient  I had personally discussed CODE STATUS with patient      Family Communication:   Family not at  Bedside    Disposition Plan:         To home once workup is complete and patient is stable   Following barriers for discharge:                                                      Pain controlled with PO medications                                                           Will need to be able to tolerate PO                            Will likely need home health, home O2, set up                           Will need consultants to evaluate patient prior to discharge                                         Consults called: PCCM is aware  Admission status:  ED Disposition    ED Disposition Condition [place]: [place] [[id number]]  Level of Care: Progressive [102]  Admit to Progressive based on following criteria: CARDIOVASCULAR & THORACIC of moderate stability with acute coronary syndrome symptoms/low risk myocardial infarction/hypertensive urgency/arrhythmias/heart failure potentially compromising stability and stable post cardiovascular intervention patients.  May admit patient to [facility] or [facility] if equivalent level of care is available:: No  Covid Evaluation: Asymptomatic Screening Protocol (No Symptoms)  Diagnosis: Spontaneous pneumothorax [335456]  Admitting Physician: Toy Baker [[id number]]  Attending Physician: Toy Baker [[id number]]  Estimated length of stay: past midnight tomorrow  Certification:: I certify this patient will need inpatient services for at least 2 midnights         inpatient     I Expect 2 midnight stay secondary to severity of patient's current illness need for inpatient interventions justified by the following:  hemodynamic instability despite optimal treatment ( hypoxia  )   Severe lab/radiological/exam abnormalities including:   Pneumothorax and extensive comorbidities including:     COPD/asthma .    That are currently affecting medical management.   I expect  patient to be hospitalized for 2 midnights requiring inpatient medical care.  Patient is at high risk for adverse outcome (such as loss of life or disability) if not treated.  Indication for inpatient stay as follows:     persistent chest pain despite medical management Need for operative/procedural  intervention New or worsening hypoxia  Need for chest tube  Level of care         SDU tele indefinitely please discontinue once patient no longer qualifies COVID-19 Labs    Lab Results  Component Value Date   [place] NEGATIVE 03/28/2020     Precautions: admitted as asymptomatic screening protoco  PPE: Used by the provider:   N95  eye Goggles,  Gloves      Analucia Hush 03/28/2020, 11:10 PM    [facility]     after 2 AM please page floor coverage PA If 7AM-7PM, please contact the day team taking care of the patient using [email or web address]   Patient was evaluated in the context of the global COVID-19 pandemic, which necessitated consideration that the patient might be at risk for infection with the SARS-CoV-2 virus that causes COVID-19. Institutional protocols and algorithms that pertain to the evaluation of patients at risk for COVID-19 are in a state of rapid change based on information released by regulatory bodies including the CDC and federal and state organizations. These policies and algorithms were followed during the patient's care.

## 2020-03-28 NOTE — Progress Notes (Signed)
Established Patient Office Visit  Subjective:  Patient ID: Gregory Suarez, male    DOB: January 13, 1946  Age: 74 y.o. MRN: [id number]  CC:  Chief Complaint  Patient presents with  . Shortness of Breath    started 06/28, pain on R side that went to chest area    HPI Gregory Suarez is a 74 year old male that woke at 3 AM with SOB and right chest tightness. He got out of bed and took two tylenol that seemed to resolved his sxs except he started having sob again the earlier part of this afternoon. The pt does report that he has a new bed that elevates with built in massagers that he used before falling asleep last night. Pt denied being in acute rep distress, denied gu/gi sxs, cp.    Past Medical History:  Diagnosis Date  . Basal cell carcinoma 06/28/2015   left temple  . Basal cell carcinoma 08/05/2017   right cheek inf to zygoma  . CAD (coronary artery disease)   . Elevated PSA   . Erectile dysfunction   . GERD (gastroesophageal reflux disease)   . Hyperlipidemia   . Hypertension   . Mitral valve regurgitation   . OSA treated with BiPAP   . RLS (restless legs syndrome)   . 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 Tubular adenoma of colon     Past Surgical History:  Procedure Laterality Date  . ANKLE SURGERY    . CHOLECYSTECTOMY    . CHOLECYSTECTOMY    . LEFT HEART CATH AND CORONARY ANGIOGRAPHY N/A 02/13/2019   Procedure: LEFT HEART CATH AND CORONARY ANGIOGRAPHY;  Surgeon: Troy Sine, MD;  Location: [facility];  Service: Cardiovascular;  Laterality: N/A;    Family History  Problem Relation Age of Onset  . Diabetes Other   . Breast cancer Other   . Coronary artery disease Other     Social History   Socioeconomic History  . Marital status: Married    Spouse name: Not on file  . Number of children: Not on file  . Years of education: Not on file  . Highest education level: Not on file  Occupational History  . Occupation: RETIRED   Tobacco Use  . Smoking status: Former Smoker    Packs/day: 0.50    Years: 15.00    Pack years: 7.50    Types: Cigarettes    Quit date: 10/01/1988    Years since quitting: 31.5  . Smokeless tobacco: Never Used  Substance and Sexual Activity  . Alcohol use: No    Alcohol/week: 0.0 standard drinks  . Drug use: No  . Sexual activity: Yes    Comment: married, 2 grown sons, retired maintenance man  Other Topics Concern  . Not on file  Social History Narrative  . Not on file   Social Determinants of Health   Financial Resource Strain:   . Difficulty of Paying Living Expenses:   Food Insecurity:   . Worried About Charity fundraiser in the Last Year:   . Arboriculturist in the Last Year:   Transportation Needs:   . Film/video editor (Medical):   Marland Kitchen Lack of Transportation (Non-Medical):   Physical Activity:   . Days of Exercise per Week:   . Minutes of Exercise per Session:   Stress:   . Feeling of Stress :   Social Connections:   . Frequency of Communication with Friends and Family:   .  Frequency of Social Gatherings with Friends and Family:   . Attends Religious Services:   . Active Member of Clubs or Organizations:   . Attends Archivist Meetings:   Marland Kitchen Marital Status:   Intimate Partner Violence:   . Fear of Current or Ex-Partner:   . Emotionally Abused:   Marland Kitchen Physically Abused:   . Sexually Abused:     Outpatient Medications Prior to Visit  Medication Sig Dispense Refill  . amLODipine (NORVASC) 2.5 MG tablet Take 1 tablet (2.5 mg total) by mouth daily. 90 tablet 3  . aspirin EC 81 MG tablet Take 1 tablet (81 mg total) by mouth daily. 90 tablet 3  . atorvastatin (LIPITOR) 80 MG tablet TAKE 1 TABLET BY MOUTH EVERY DAY 90 tablet 3  . benzonatate (TESSALON) 100 MG capsule Take 1 capsule (100 mg total) by mouth 3 (three) times daily as needed for cough. 30 capsule 0  . chlorpheniramine (ALLERGY) 4 MG tablet Take 8 mg by mouth at bedtime.     . finasteride  (PROSCAR) 5 MG tablet Take 1 tablet (5 mg total) by mouth daily. 90 tablet 3  . fluticasone (FLONASE) 50 MCG/ACT nasal spray Place 2 sprays into both nostrils daily. (Patient taking differently: Place 2 sprays into both nostrils daily as needed for allergies. ) 16 g 2  . furosemide (LASIX) 20 MG tablet TAKE 1 TABLET BY MOUTH EVERY DAY 90 tablet 0  . HYDROcodone-homatropine (HYCODAN) 5-1.5 MG/5ML syrup Take 5 mLs by mouth every 8 (eight) hours as needed for cough. 120 mL 0  . Melatonin 5 MG TABS Take 5 mg by mouth at bedtime.    . metoprolol tartrate (LOPRESSOR) 25 MG tablet TAKE 1 TABLET BY MOUTH TWICE A DAY 180 tablet 2  . Multiple Vitamin (MULTIVITAMIN WITH MINERALS) TABS tablet Take 1 tablet by mouth daily. AREDDS for eye health    . pantoprazole (PROTONIX) 40 MG tablet Take 1 tablet (40 mg total) by mouth 2 (two) times daily. 180 tablet 3  . pramipexole (MIRAPEX) 0.25 MG tablet TAKE 2 TABLETS BY MOUTH AT BEDTIME 180 tablet 1  . tadalafil (CIALIS) 5 MG tablet TAKE ONE TABLET BY MOUTH DAILY 90 tablet 2  . tiZANidine (ZANAFLEX) 4 MG tablet Take 1 tablet (4 mg total) by mouth every 8 (eight) hours as needed for muscle spasms. 30 tablet 1  . valACYclovir (VALTREX) 1000 MG tablet Take 1 tablet (1,000 mg total) by mouth daily. (Patient taking differently: Take 1,000 mg by mouth daily as needed. ) 30 tablet 1  . amLODipine (NORVASC) 2.5 MG tablet Take 1 tablet (2.5 mg total) by mouth daily. 90 tablet 3   Facility-Administered Medications Prior to Visit  Medication Dose Route Frequency Provider Last Rate Last Admin  . sodium chloride flush (NS) 0.9 % injection 3 mL  3 mL Intravenous Q12H Meng, [place], [place]        No Known Allergies  ROS Review of Systems  All other systems reviewed and are negative.     Objective:    Physical Exam Vitals and nursing note reviewed.  Constitutional:      Appearance: Normal appearance. He is well-developed and well-groomed. He is not ill-appearing or  toxic-appearing.  HENT:     Head: Normocephalic.     Right Ear: Hearing normal.     Left Ear: Hearing normal.     Mouth/Throat:     Lips: Pink.  Eyes:     General: Lids are normal. Lids are everted,  no foreign bodies appreciated.     Extraocular Movements: Extraocular movements intact.     Pupils: Pupils are equal, round, and reactive to light.  Neck:     Vascular: No JVD.  Cardiovascular:     Rate and Rhythm: Normal rate and regular rhythm.     Pulses: Normal pulses.     Heart sounds: Normal heart sounds, S1 normal and S2 normal.  Pulmonary:     Effort: Pulmonary effort is normal.     Breath sounds: Normal breath sounds.  Musculoskeletal:     Cervical back: Normal range of motion and neck supple.     Right lower leg: No edema.     Left lower leg: No edema.  Skin:    General: Skin is warm and dry.     Capillary Refill: Capillary refill takes less than 2 seconds.     Coloration: Skin is not ashen, cyanotic or pale.  Neurological:     General: No focal deficit present.     Mental Status: He is alert and oriented to person, place, and time.     GCS: GCS eye subscore is 4. GCS verbal subscore is 5. GCS motor subscore is 6.  Psychiatric:        Attention and Perception: Attention and perception normal.        Mood and Affect: Mood and affect normal.        Speech: Speech normal.        Behavior: Behavior is cooperative.        Cognition and Memory: Cognition normal.        Judgment: Judgment normal.     BP 120/68 (BP Location: Right Arm, Patient Position: Sitting, Cuff Size: Normal)   Pulse 85   Temp 98.2 F (36.8 C) (Temporal)   Resp 18   Wt 249 lb (112.9 kg)   SpO2 93%   BMI 31.12 kg/m  Wt Readings from Last 3 Encounters:  03/28/20 249 lb (112.9 kg)  02/26/20 246 lb 6.4 oz (111.8 kg)  01/14/20 244 lb (110.7 kg)     Health Maintenance Due  Topic Date Due  . Hepatitis C Screening  Never done  . TETANUS/TDAP  05/25/2019    There are no preventive care  reminders to display for this patient.  Lab Results  Component Value Date   TSH 1.89 02/07/2017   Lab Results  Component Value Date   WBC 5.9 12/08/2019   HGB 14.5 12/08/2019   HCT 43.6 12/08/2019   MCV 93.8 12/08/2019   PLT 176 12/08/2019   Lab Results  Component Value Date   NA 142 12/08/2019   K 4.3 12/08/2019   CO2 30 12/08/2019   GLUCOSE 82 12/08/2019   BUN 12 12/08/2019   CREATININE 0.94 12/08/2019   BILITOT 0.8 12/08/2019   ALKPHOS 102 04/22/2019   AST 19 12/08/2019   ALT 15 12/08/2019   PROT 6.6 12/08/2019   ALBUMIN 4.3 04/22/2019   CALCIUM 9.0 12/08/2019   ANIONGAP 8 02/13/2019   Lab Results  Component Value Date   CHOL 113 12/08/2019   Lab Results  Component Value Date   HDL 44 12/08/2019   Lab Results  Component Value Date   LDLCALC 53 12/08/2019   Lab Results  Component Value Date   TRIG 79 12/08/2019   Lab Results  Component Value Date   CHOLHDL 2.6 12/08/2019   No results found for: HGBA1C    Assessment & Plan:   Problem List Items Addressed  This Visit    None    Visit Diagnoses    Shortness of breath    -  Primary   Relevant Orders   EKG 12-Lead (Completed)   Sensation of chest tightness         EKG shows NSR Left axis fascicular block with new nonspecific T-abnormality. With pts sxs, pmh, pt was advised that he should go to ER for evaluation. The pt declined EMS or call for family transport. He did agree to contact his wife so that he pack his medication and CPAP to have his wife take him to the ER. The pt is not having sxs of near syncope, not in acute distress, not ill appearing, no visual disturbance. Pt was advised of risk vs benefit of non EMS drive to ER and desired as stated his choice. I do feel that the pt is at this point stable to drive however should be evaluated for etiology of his sxs being cardiac in nature.  Triage nurse at ER of pt choice given report by CMA in [facility] clinic.   Follow-up: Return if symptoms worsen  or fail to improve.    Annie Main, FNP

## 2020-03-28 NOTE — ED Notes (Signed)
Some night medications held, pt reports taking them already. See [facility]

## 2020-03-28 NOTE — ED Provider Notes (Signed)
[facility] EMERGENCY DEPARTMENT Provider Note   CSN: [id number] Arrival date & time: 03/28/20  1725     History Chief Complaint  Patient presents with  . Chest Pain  . Shortness of Breath    Gregory Suarez is a 74 y.o. male.  HPI   73yM with CP and shortness of breath. Woke up with symptoms. Sharp pain in the right side of his chest. Feels like he cannot catch his breath. No fever. No cough. CXR done in ED prior to my evaluation showing R pneumothorax. Hx of COPD and sleep apnea on CPAP. No hx of pneumothorax. No prior lung procedures.   Past Medical History:  Diagnosis Date  . Basal cell carcinoma 06/28/2015   left temple  . Basal cell carcinoma 08/05/2017   right cheek inf to zygoma  . CAD (coronary artery disease)   . Elevated PSA   . Erectile dysfunction   . GERD (gastroesophageal reflux disease)   . Hyperlipidemia   . Hypertension   . Mitral valve regurgitation   . OSA treated with BiPAP   . RLS (restless legs syndrome)   . 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 Tubular adenoma of colon     Patient Active Problem List   Diagnosis Date Noted  . Tubular adenoma of colon   . RLS (restless legs syndrome)   . Abnormal cardiac CT angiography 02/27/2012  . Chronic cough 02/27/2012  . Hypertension 02/26/2012  . Hyperlipidemia 02/26/2012  . GERD (gastroesophageal reflux disease)   . CAD (coronary artery disease)   . Erectile dysfunction   . Mitral valve regurgitation     Past Surgical History:  Procedure Laterality Date  . ANKLE SURGERY    . CHOLECYSTECTOMY    . CHOLECYSTECTOMY    . LEFT HEART CATH AND CORONARY ANGIOGRAPHY N/A 02/13/2019   Procedure: LEFT HEART CATH AND CORONARY ANGIOGRAPHY;  Surgeon: Troy Sine, MD;  Location: [facility];  Service: Cardiovascular;  Laterality: N/A;       Family History  Problem Relation Age of Onset  . Diabetes Other   . Breast cancer Other   . Coronary artery  disease Other     Social History   Tobacco Use  . Smoking status: Former Smoker    Packs/day: 0.50    Years: 15.00    Pack years: 7.50    Types: Cigarettes    Quit date: 10/01/1988    Years since quitting: 31.5  . Smokeless tobacco: Never Used  Substance Use Topics  . Alcohol use: No    Alcohol/week: 0.0 standard drinks  . Drug use: No    Home Medications Prior to Admission medications   Medication Sig Start Date End Date Taking? Authorizing Provider  amLODipine (NORVASC) 2.5 MG tablet Take 1 tablet (2.5 mg total) by mouth daily. 01/19/20   Almyra Deforest, PA  aspirin EC 81 MG tablet Take 1 tablet (81 mg total) by mouth daily. 11/12/18   Troy Sine, MD  atorvastatin (LIPITOR) 80 MG tablet TAKE 1 TABLET BY MOUTH EVERY DAY 01/22/20   Bhagat, Bhavinkumar, PA  benzonatate (TESSALON) 100 MG capsule Take 1 capsule (100 mg total) by mouth 3 (three) times daily as needed for cough. 10/30/18   Delsa Grana, PA-C  chlorpheniramine (ALLERGY) 4 MG tablet Take 8 mg by mouth at bedtime.     [provider]  finasteride (PROSCAR) 5 MG tablet Take 1 tablet (5 mg total) by mouth  daily. 02/18/20   [name], MD  fluticasone Asencion Islam) 50 MCG/ACT nasal spray Place 2 sprays into both nostrils daily. Patient taking differently: Place 2 sprays into both nostrils daily as needed for allergies.  07/18/18   Delsa Grana, PA-C  furosemide (LASIX) 20 MG tablet TAKE 1 TABLET BY MOUTH EVERY DAY 02/10/20   Almyra Deforest, PA  HYDROcodone-homatropine [facility]) 5-1.5 MG/5ML syrup Take 5 mLs by mouth every 8 (eight) hours as needed for cough. 11/11/18   [name], MD  Melatonin 5 MG TABS Take 5 mg by mouth at bedtime.    [provider]  metoprolol tartrate (LOPRESSOR) 25 MG tablet TAKE 1 TABLET BY MOUTH TWICE A DAY 11/30/19   [name], MD  Multiple Vitamin (MULTIVITAMIN WITH MINERALS) TABS tablet Take 1 tablet by mouth daily. AREDDS for eye health    [provider]    pantoprazole (PROTONIX) 40 MG tablet Take 1 tablet (40 mg total) by mouth 2 (two) times daily. 01/25/20   [name], MD  pramipexole (MIRAPEX) 0.25 MG tablet TAKE 2 TABLETS BY MOUTH AT BEDTIME 01/18/20   [name], MD  tadalafil (CIALIS) 5 MG tablet TAKE ONE TABLET BY MOUTH DAILY 11/25/19   [name], MD  tiZANidine (ZANAFLEX) 4 MG tablet Take 1 tablet (4 mg total) by mouth every 8 (eight) hours as needed for muscle spasms. 11/11/18   [name], MD  valACYclovir (VALTREX) 1000 MG tablet Take 1 tablet (1,000 mg total) by mouth daily. Patient taking differently: Take 1,000 mg by mouth daily as needed.  03/11/15   Alycia Rossetti, MD    Allergies    Patient has no known allergies.  Review of Systems   Review of Systems All systems reviewed and negative, other than as noted in HPI.  Physical Exam Updated Vital Signs BP (!) 151/80 (BP Location: Left Arm)   Pulse 76   Temp 98 F (36.7 C) (Oral)   Resp 20   SpO2 92%   Physical Exam Vitals and nursing note reviewed.  Constitutional:      General: He is not in acute distress.    Appearance: He is well-developed.  HENT:     Head: Normocephalic and atraumatic.  Eyes:     General:        Right eye: No discharge.        Left eye: No discharge.     Conjunctiva/sclera: Conjunctivae normal.  Cardiovascular:     Rate and Rhythm: Normal rate and regular rhythm.     Heart sounds: Normal heart sounds. No murmur heard.  No friction rub. No gallop.   Pulmonary:     Effort: Pulmonary effort is normal. No respiratory distress.     Comments: Mild tachypnea. Decreased breath sounds on R.  Abdominal:     General: There is no distension.     Palpations: Abdomen is soft.     Tenderness: There is no abdominal tenderness.  Musculoskeletal:        General: No tenderness.     Cervical back: Neck supple.  Skin:    General: Skin is warm and dry.  Neurological:     Mental Status: He is alert.  Psychiatric:         Behavior: Behavior normal.        Thought Content: Thought content normal.     ED Results / Procedures / Treatments   Labs (all labs ordered are listed, but only abnormal results  are displayed) Labs Reviewed  BASIC METABOLIC PANEL - Abnormal; Notable for the following components:      Result Value   Glucose, Bld 103 (*)    Creatinine, Ser 1.33 (*)    GFR calc non Af Amer 53 (*)    All other components within normal limits  SARS CORONAVIRUS 2 BY RT PCR (HOSPITAL ORDER, [facility])  CBC  TROPONIN I (HIGH SENSITIVITY)  TROPONIN I (HIGH SENSITIVITY)    EKG None  Radiology DG Chest 2 View  Result Date: 03/28/2020 CLINICAL DATA:  Chest pain, shortness of breath EXAM: CHEST - 2 VIEW COMPARISON:  10/30/2018 FINDINGS: There is a large right pneumothorax laterally, approximately 50% in size. Atelectasis throughout the compressed right lung. No confluent opacity on the left. Emphysema. Heart is normal size. No acute bony abnormality. IMPRESSION: Large, approximately 50% right pneumothorax. No evidence of tension. Emphysema. Critical Value/emergent results were called by telephone at the time of interpretation on 03/28/2020 at 6:48 pm to provider Dr. Roslynn Amble , who verbally acknowledged these results. Electronically Signed   By: Rolm Baptise M.D.   On: 03/28/2020 18:50   DG Chest Portable 1 View  Result Date: 03/28/2020 CLINICAL DATA:  Chest tube placement EXAM: PORTABLE CHEST 1 VIEW COMPARISON:  03/28/2020 FINDINGS: Interval placement of right chest tube. Re-expansion of the right lung. No visible residual pneumothorax. Right base atelectasis. Left lung clear. Heart is normal size. IMPRESSION: Interval placement of right chest tube with re-expansion of the right lung. No visible residual pneumothorax. Right base atelectasis. Electronically Signed   By: Rolm Baptise M.D.   On: 03/28/2020 20:08    Procedures CHEST TUBE INSERTION  Date/Time: 03/28/2020 7:30  PM Performed by: Virgel Manifold, MD Authorized by: Virgel Manifold, MD   Consent:    Consent obtained:  Verbal   Consent given by:  Patient   Risks discussed:  Bleeding, incomplete drainage, pain, infection and damage to surrounding structures Pre-procedure details:    Skin preparation:  ChloraPrep   Preparation: Patient was prepped and draped in the usual sterile fashion   Anesthesia (see MAR for exact dosages):    Anesthesia method:  Local infiltration   Local anesthetic:  Lidocaine 1% w/o epi Procedure details:    Placement location:  R lateral   Scalpel size:  11   Tube size (French): 14.   Ultrasound guidance: no     Tension pneumothorax: no     Tube connected to:  Suction   Drainage characteristics:  Air only   Suture material:  2-0 silk   Dressing:  Petrolatum-impregnated gauze and 4x4 sterile gauze Post-procedure details:    Post-insertion x-ray findings: tube in good position     Patient tolerance of procedure:  Tolerated well, no immediate complications   CRITICAL CARE Performed by: Virgel Manifold Total critical care time: 35 minutes Critical care time was exclusive of separately billable procedures and treating other patients. Critical care was necessary to treat or prevent imminent or life-threatening deterioration. Critical care was time spent personally by me on the following activities: development of treatment plan with patient and/or surrogate as well as nursing, discussions with consultants, evaluation of patient's response to treatment, examination of patient, obtaining history from patient or surrogate, ordering and performing treatments and interventions, ordering and review of laboratory studies, ordering and review of radiographic studies, pulse oximetry and re-evaluation of patient's condition.   (including critical care time)   Medications Ordered in ED Medications  sodium chloride flush (  NS) 0.9 % injection 3 mL (has no administration in time range)     ED Course  I have reviewed the triage vital signs and the nursing notes.  Pertinent labs & imaging results that were available during my care of the patient were reviewed by me and considered in my medical decision making (see chart for details).    MDM Rules/Calculators/A&P                          69yM with spontaneous pneumothorax. Will place chest tube. Pulm/CCM consultation.   Imaging reviewed. Lung re-expanded. Discussed with e-link. Pulm will see in consultation, likely in the morning. Requesting admission to medical service.   Final Clinical Impression(s) / ED Diagnoses Final diagnoses:  Spontaneous pneumothorax    Rx / DC Orders ED Discharge Orders    None       Virgel Manifold, MD 03/28/20 2026

## 2020-03-29 ENCOUNTER — Inpatient Hospital Stay (HOSPITAL_COMMUNITY): Payer: Medicare Other

## 2020-03-29 DIAGNOSIS — J449 Chronic obstructive pulmonary disease, unspecified: Secondary | ICD-10-CM

## 2020-03-29 DIAGNOSIS — K219 Gastro-esophageal reflux disease without esophagitis: Secondary | ICD-10-CM

## 2020-03-29 DIAGNOSIS — I10 Essential (primary) hypertension: Secondary | ICD-10-CM

## 2020-03-29 DIAGNOSIS — E785 Hyperlipidemia, unspecified: Secondary | ICD-10-CM

## 2020-03-29 DIAGNOSIS — G2581 Restless legs syndrome: Secondary | ICD-10-CM

## 2020-03-29 LAB — CBC WITH DIFFERENTIAL/PLATELET
Abs Immature Granulocytes: 0.03 10*3/uL (ref 0.00–0.07)
Basophils Absolute: 0 10*3/uL (ref 0.0–0.1)
Basophils Relative: 0 %
Eosinophils Absolute: 0.6 10*3/uL — ABNORMAL HIGH (ref 0.0–0.5)
Eosinophils Relative: 7 %
HCT: 46.3 % (ref 39.0–52.0)
Hemoglobin: 14.9 g/dL (ref 13.0–17.0)
Immature Granulocytes: 0 %
Lymphocytes Relative: 10 %
Lymphs Abs: 0.9 10*3/uL (ref 0.7–4.0)
MCH: 30.7 pg (ref 26.0–34.0)
MCHC: 32.2 g/dL (ref 30.0–36.0)
MCV: 95.3 fL (ref 80.0–100.0)
Monocytes Absolute: 0.4 10*3/uL (ref 0.1–1.0)
Monocytes Relative: 4 %
Neutro Abs: 6.7 10*3/uL (ref 1.7–7.7)
Neutrophils Relative %: 79 %
Platelets: 170 10*3/uL (ref 150–400)
RBC: 4.86 MIL/uL (ref 4.22–5.81)
RDW: 13.3 % (ref 11.5–15.5)
WBC: 8.6 10*3/uL (ref 4.0–10.5)
nRBC: 0 % (ref 0.0–0.2)

## 2020-03-29 LAB — COMPREHENSIVE METABOLIC PANEL
ALT: 18 U/L (ref 0–44)
AST: 20 U/L (ref 15–41)
Albumin: 3.8 g/dL (ref 3.5–5.0)
Alkaline Phosphatase: 98 U/L (ref 38–126)
Anion gap: 8 (ref 5–15)
BUN: 14 mg/dL (ref 8–23)
CO2: 23 mmol/L (ref 22–32)
Calcium: 8.8 mg/dL — ABNORMAL LOW (ref 8.9–10.3)
Chloride: 108 mmol/L (ref 98–111)
Creatinine, Ser: 0.94 mg/dL (ref 0.61–1.24)
GFR calc Af Amer: >60 mL/min (ref >60)
GFR calc non Af Amer: >60 mL/min (ref >60)
Glucose, Bld: 117 mg/dL — ABNORMAL HIGH (ref 70–99)
Potassium: 3.6 mmol/L (ref 3.5–5.1)
Sodium: 139 mmol/L (ref 135–145)
Total Bilirubin: 1 mg/dL (ref 0.3–1.2)
Total Protein: 7.1 g/dL (ref 6.5–8.1)

## 2020-03-29 LAB — PHOSPHORUS: Phosphorus: 3 mg/dL (ref 2.5–4.6)

## 2020-03-29 LAB — TSH: TSH: 1.904 u[IU]/mL (ref 0.350–4.500)

## 2020-03-29 LAB — MRSA PCR SCREENING: MRSA by PCR: NEGATIVE

## 2020-03-29 LAB — SODIUM, URINE, RANDOM: Sodium, Ur: 123 mmol/L

## 2020-03-29 LAB — CREATININE, URINE, RANDOM: Creatinine, Urine: 93.32 mg/dL

## 2020-03-29 LAB — MAGNESIUM: Magnesium: 1.9 mg/dL (ref 1.7–2.4)

## 2020-03-29 MED ORDER — ACETAMINOPHEN 325 MG PO TABS: 650.0000 mg | ORAL_TABLET | Freq: Four times a day (QID) | ORAL | Status: DC | PRN

## 2020-03-29 MED ORDER — SODIUM CHLORIDE 0.9% FLUSH: 3.0000 mL | INTRAVENOUS | Status: DC | PRN

## 2020-03-29 MED ORDER — HYDROCODONE-HOMATROPINE 5-1.5 MG/5ML PO SYRP: 5.0000 mL | ORAL_SOLUTION | Freq: Three times a day (TID) | ORAL | Status: DC | PRN

## 2020-03-29 MED ORDER — SODIUM CHLORIDE 0.9% FLUSH
3.0000 mL | Freq: Two times a day (BID) | INTRAVENOUS | Status: DC
  Administered 2020-03-29 (×2): 3 mL via INTRAVENOUS

## 2020-03-29 MED ORDER — ONDANSETRON HCL 4 MG PO TABS: 4.0000 mg | ORAL_TABLET | Freq: Four times a day (QID) | ORAL | Status: DC | PRN

## 2020-03-29 MED ORDER — ONDANSETRON HCL 4 MG/2ML IJ SOLN: 4.0000 mg | Freq: Four times a day (QID) | INTRAMUSCULAR | Status: DC | PRN

## 2020-03-29 MED ORDER — VALACYCLOVIR HCL 500 MG PO TABS
1000.0000 mg | ORAL_TABLET | Freq: Every day | ORAL | Status: DC
  Filled 2020-03-29 (×2): qty 2

## 2020-03-29 MED ORDER — DOCUSATE SODIUM 100 MG PO CAPS: 100.0000 mg | ORAL_CAPSULE | Freq: Two times a day (BID) | ORAL | Status: DC

## 2020-03-29 MED ORDER — BENZONATATE 100 MG PO CAPS: 100.0000 mg | ORAL_CAPSULE | Freq: Three times a day (TID) | ORAL | Status: DC | PRN

## 2020-03-29 MED ORDER — ACETAMINOPHEN 650 MG RE SUPP: 650.0000 mg | Freq: Four times a day (QID) | RECTAL | Status: DC | PRN

## 2020-03-29 MED ORDER — FLUTICASONE PROPIONATE 50 MCG/ACT NA SUSP
2.0000 | Freq: Every day | NASAL | Status: DC
  Filled 2020-03-29: qty 16

## 2020-03-29 MED ORDER — HYDROCODONE-ACETAMINOPHEN 5-325 MG PO TABS
1.0000 | ORAL_TABLET | ORAL | Status: DC | PRN
  Administered 2020-03-29: 1 via ORAL
  Administered 2020-03-29 (×2): 2 via ORAL
  Filled 2020-03-29 (×2): qty 2
  Filled 2020-03-29: qty 1

## 2020-03-29 MED ORDER — MOMETASONE FURO-FORMOTEROL FUM 100-5 MCG/ACT IN AERO
2.0000 | INHALATION_SPRAY | Freq: Two times a day (BID) | RESPIRATORY_TRACT | Status: DC
  Administered 2020-03-30: 2 via RESPIRATORY_TRACT
  Filled 2020-03-29: qty 8.8

## 2020-03-29 MED ORDER — SODIUM CHLORIDE 0.9 % IV SOLN: 250.0000 mL | INTRAVENOUS | Status: DC | PRN

## 2020-03-29 MED ORDER — UMECLIDINIUM BROMIDE 62.5 MCG/INH IN AEPB
1.0000 | INHALATION_SPRAY | Freq: Every day | RESPIRATORY_TRACT | Status: DC
  Administered 2020-03-30: 1 via RESPIRATORY_TRACT
  Filled 2020-03-29: qty 7

## 2020-03-29 NOTE — ED Notes (Signed)
First attempt to call report unsuccessful. 

## 2020-03-29 NOTE — ED Notes (Signed)
Chest tube disconnected from suction per order.

## 2020-03-29 NOTE — Progress Notes (Signed)
PROGRESS NOTE    Gregory Suarez  OEU:[id number] DOB: Dec 11, 1945 DOA: 03/28/2020 PCP: [name], MD    Chief Complaint  Patient presents with  . Chest Pain  . Shortness of Breath    Brief Narrative:  Patient is a pleasant 74 year old gentleman, history of COPD, OSA on CPAP, coronary artery disease, hypertension, GERD presented to the ED with complaints of right-sided chest pain that suddenly awoken patient from sleep at night with some associated shortness of breath.  Patient went to PCPs and subsequently directed to the ED.  Patient seen in the ED and noted to have a right-sided pneumothorax.  Chest tube placed by ED provider.  Follow-up chest x-ray with reexpansion of the lung.  PCCM consulted for chest tube management.  Hospitalist consulted to admit patient.   Assessment & Plan:   Principal Problem:   Spontaneous pneumothorax Active Problems:   GERD (gastroesophageal reflux disease)   CAD (coronary artery disease)   Hypertension   Hyperlipidemia   RLS (restless legs syndrome)   COPD (chronic obstructive pulmonary disease) (HCC)  #1 spontaneous right-sided pneumothorax Status post chest tube placement per ED.  Chest x-ray this morning with right chest tube in stable position, no pneumothorax, persistent bibasilar atelectasis and/or scarring.  No acute infiltrate.  Persistent biapical pleural thickening consistent with scarring.  Patient with clinical improvement.  PCCM managing chest tube.  2.  Gastroesophageal reflux disease PPI.  3.  Hypertension Stable.  Continue Norvasc, Lopressor.  4.  Restless leg syndrome Mirapex.  5.  COPD Stable.  Place on [place], [place].  Follow.  6.  Hyperlipidemia Continue statin.  7.  Coronary artery disease Stable.  Continue Lopressor, Lipitor, aspirin.    DVT prophylaxis: SCDs Code Status: Full Family Communication: Updated patient.  No family at bedside. Disposition:   Status is: Inpatient    Dispo: The patient is  from: Home              Anticipated d/c is to: Home              Anticipated d/c date is: To be determined.              Patient currently with chest tube in place.  PCCM following.  Not stable for discharge.       Consultants:   PCCM: Dr Ander Slade 03/29/2020  Procedures:   Chest x-ray 03/28/2020, 03/29/2020  Antimicrobials:   None   Subjective: Sitting up in chair.  States shortness of breath is improved.  Right chest pain improved.  Speaking in full sentences.  Tolerating current diet.  Objective: Vitals:   03/29/20 1717 03/29/20 1731 03/29/20 1857 03/29/20 1900  BP: (!) 152/59 (!) 119/58  (!) 156/77  Pulse: 73 77  82  Resp: 17 15  15   Temp:    99.4 F (37.4 C)  TempSrc:    Oral  SpO2: 96% 98%  98%  Weight:   113.9 kg   Height:   6\' 3"  (1.905 m)    No intake or output data in the 24 hours ending 03/29/20 1919 Filed Weights   03/29/20 1857  Weight: 113.9 kg    Examination:  General exam: NAD Respiratory system: Decreased breath sounds in the bases.  No wheezing.  No rhonchi.  No crackles.  Speaking in full sentences.  Normal respiratory effort.  Chest tube in place. Cardiovascular system: RRR no murmurs rubs or gallops.  No JVD.  No lower extremity edema.    Gastrointestinal system: Abdomen is  soft, nontender, nondistended, positive bowel sounds.  No rebound.  No guarding.  Central nervous system: Alert and oriented. No focal neurological deficits. Extremities: Symmetric 5 x 5 power. Skin: No rashes, lesions or ulcers Psychiatry: Judgement and insight appear normal. Mood & affect appropriate.     Data Reviewed: I have personally reviewed following labs and imaging studies  CBC: Recent Labs  Lab 03/28/20 1811 03/29/20 1015  WBC 9.5 8.6  NEUTROABS  --  6.7  HGB 15.1 14.9  HCT 46.6 46.3  MCV 95.7 95.3  PLT 186 229    Basic Metabolic Panel: Recent Labs  Lab 03/28/20 1811 03/29/20 1015  NA 142 139  K 3.7 3.6  CL 107 108  CO2 25 23  GLUCOSE 103*  117*  BUN 17 14  CREATININE 1.33* 0.94  CALCIUM 8.9 8.8*  MG  --  1.9  PHOS  --  3.0    GFR: Estimated Creatinine Clearance: 95.3 mL/min (by C-G formula based on SCr of 0.94 mg/dL).  Liver Function Tests: Recent Labs  Lab 03/29/20 1015  AST 20  ALT 18  ALKPHOS 98  BILITOT 1.0  PROT 7.1  ALBUMIN 3.8    CBG: No results for input(s): GLUCAP in the last 168 hours.   Recent Results (from the past 240 hour(s))  SARS Coronavirus 2 by RT PCR (hospital order, performed in [facility] hospital lab) Nasopharyngeal Nasopharyngeal Swab     Status: None   Collection Time: 03/28/20  7:22 PM   Specimen: Nasopharyngeal Swab  Result Value Ref Range Status   SARS Coronavirus 2 NEGATIVE NEGATIVE Final    Comment: (NOTE) SARS-CoV-2 target nucleic acids are NOT DETECTED.  The SARS-CoV-2 RNA is generally detectable in upper and lower respiratory specimens during the acute phase of infection. The lowest concentration of SARS-CoV-2 viral copies this assay can detect is 250 copies / mL. A negative result does not preclude SARS-CoV-2 infection and should not be used as the sole basis for treatment or other patient management decisions.  A negative result may occur with improper specimen collection / handling, submission of specimen other than nasopharyngeal swab, presence of viral mutation(s) within the areas targeted by this assay, and inadequate number of viral copies (<250 copies / mL). A negative result must be combined with clinical observations, patient history, and epidemiological information.  Fact Sheet for Patients:   StrictlyIdeas.no  Fact Sheet for Healthcare Providers: BankingDealers.co.za  This test is not yet approved or  cleared by the [place] FDA and has been authorized for detection and/or diagnosis of SARS-CoV-2 by FDA under an Emergency Use Authorization (EUA).  This EUA will remain in effect (meaning this test can  be used) for the duration of the COVID-19 declaration under Section 564(b)(1) of the Act, 21 U.S.C. section 360bbb-3(b)(1), unless the authorization is terminated or revoked sooner.  Performed at [facility], [place] [address] [place], [place] [address]          Radiology Studies: DG Chest 2 View  Result Date: 03/28/2020 CLINICAL DATA:  Chest pain, shortness of breath EXAM: CHEST - 2 VIEW COMPARISON:  10/30/2018 FINDINGS: There is a large right pneumothorax laterally, approximately 50% in size. Atelectasis throughout the compressed right lung. No confluent opacity on the left. Emphysema. Heart is normal size. No acute bony abnormality. IMPRESSION: Large, approximately 50% right pneumothorax. No evidence of tension. Emphysema. Critical Value/emergent results were called by telephone at the time of interpretation on 03/28/2020 at 6:48 pm to provider Dr.  Dykstra , who verbally acknowledged these results. Electronically Signed   By: Rolm Baptise M.D.   On: 03/28/2020 18:50   DG CHEST PORT 1 VIEW  Result Date: 03/29/2020 CLINICAL DATA:  History of pneumothorax.  Chest tube. EXAM: PORTABLE CHEST 1 VIEW COMPARISON:  03/28/2020. FINDINGS: Right chest tube stable position. No pneumothorax. Persistent bibasilar subsegmental atelectasis. Persistent biapical pleuroparenchymal thickening consistent with scarring. Heart size normal. Degenerative change thoracic spine. IMPRESSION: 1.  Right chest tube in stable position.  No pneumothorax. 2. Persistent bibasilar atelectasis and or scarring. No acute infiltrate. Persistent biapical pleural thickening consistent with scarring. Electronically Signed   By: Marcello Moores  Register   On: 03/29/2020 09:25   DG Chest Portable 1 View  Result Date: 03/28/2020 CLINICAL DATA:  Chest tube placement EXAM: PORTABLE CHEST 1 VIEW COMPARISON:  03/28/2020 FINDINGS: Interval placement of right chest tube. Re-expansion of the right lung. No visible residual pneumothorax. Right  base atelectasis. Left lung clear. Heart is normal size. IMPRESSION: Interval placement of right chest tube with re-expansion of the right lung. No visible residual pneumothorax. Right base atelectasis. Electronically Signed   By: Rolm Baptise M.D.   On: 03/28/2020 20:08        Scheduled Meds: . amLODipine  2.5 mg Oral Daily  . aspirin EC  81 mg Oral Daily  . atorvastatin  80 mg Oral Daily  . docusate sodium  100 mg Oral BID  . finasteride  5 mg Oral Daily  . fluticasone  2 spray Each Nare Daily  . melatonin  5 mg Oral QHS  . metoprolol tartrate  25 mg Oral BID  . multivitamin with minerals  1 tablet Oral Daily  . pantoprazole  40 mg Oral BID  . pramipexole  0.5 mg Oral QHS  . sodium chloride flush  3 mL Intravenous Once  . sodium chloride flush  3 mL Intravenous Q12H  . sodium chloride flush  3 mL Intravenous Q12H  . valACYclovir  1,000 mg Oral Daily   Continuous Infusions: . sodium chloride       LOS: 1 day    Time spent: 40 minutes    Irine Seal, MD [facility]   To contact the attending provider between 7A-7P or the covering provider during after hours 7P-7A, please log into the web site [email or web address] and access using universal [place] password for that web site. If you do not have the password, please call the hospital operator.  03/29/2020, 7:19 PM

## 2020-03-29 NOTE — Consult Note (Signed)
NAME:  Gregory Suarez, MRN:  [id number], DOB:  1946-09-05, LOS: 1 ADMISSION DATE:  03/28/2020, CONSULTATION DATE:  03/29/20 REFERRING MD:  Lianne Bushy - TRH, CHIEF COMPLAINT:  R sided chest pain with SOB / Spontaneous PTX   Brief History   74 yo M hx COPD OSA on CPAP with acute R sided chest pain. Referred to ED by PCP, found to have R PTX. Pigtail placed by ED, PCCm for CT management   History of present illness   74 yo M PMH COPD (followed by Dr. Gwenette Greet), OSA (CPAP by Dr. Claiborne Billings), CAD, HTN, GERD, who presented to ED 6/28 with CC R sided chest pain. Pain was sudden onset and awoke patient from sleep. Associated symptoms include SOB. He initially presented to PCP, who referred patient to ED for further workup and care. In ED he was noted to have R sided PTX. A pigtail chest tube placed by EM provider, follow up CXR with re-inflation of R lung.   Admitted to [facility], PCCM for CT management.   Past Medical History  COPD Tobacco abuse OSA HTN HLD CAD  GERD   Significant Hospital Events   6/28> R pigtail placed by ED  Consults:  PCCM  Procedures:  6/28 R pigtail   Significant Diagnostic Tests:  CXR 6/28 R PTX CXT 6/28 Interval placement of R pigtail chest tube  Micro Data:    Antimicrobials:  None Interim history/subjective:  Noted that chest tube on wall suction but not chamber Placed to -20   Hungry and wants breakfast  Objective   Blood pressure 138/86, pulse 64, temperature 98 F (36.7 C), temperature source Oral, resp. rate 20, SpO2 95 %.       No intake or output data in the 24 hours ending 03/29/20 0843 There were no vitals filed for this visit.  Examination: General: Chronically ill appearing older adult M NAD  HENT: NCAT pink mmm trachea midline  Lungs: CTA bilaterally, Symmetrical chest expansion. R pigtail in place no air leak Cardiovascular: RRR s1s2 cap refill < 3 seconds  Abdomen: soft round ndnt Extremities: No obvious joint deformity. No BUE cyanosis or  clubbing Neuro: AOOx4 following commands GU: Defer   Resolved Hospital Problem list     Assessment & Plan:   R sided pneumothorax, spontaneous  Hx COPD Hx OSA  P -Chest tube placed to suction -- was essentially on water seal in ED  -CXR 6/29 and qAM  -Follow up this afternoon for hopeful transition to water seal  -Likely needs OP follow up with pulmonary (previously has seen Dr. Gwenette Greet)    PCCM will continue to follow for chest tube management  Anticipate chest tube being in place for 48 to 72 hours  Best practice:  Diet: Reg Pain/Anxiety/Delirium protocol (if indicated): PRN APAP, PRN vicodin  VAP protocol (if indicated): na DVT prophylaxis: SCD GI prophylaxis: protonix Glucose control: monitor Mobility: Per primary Code Status: Full Family Communication: Patient updated Disposition: Progressive   Labs   CBC: Recent Labs  Lab 03/28/20 1811  WBC 9.5  HGB 15.1  HCT 46.6  MCV 95.7  PLT 725    Basic Metabolic Panel: Recent Labs  Lab 03/28/20 1811  NA 142  K 3.7  CL 107  CO2 25  GLUCOSE 103*  BUN 17  CREATININE 1.33*  CALCIUM 8.9   GFR: Estimated Creatinine Clearance: 67.1 mL/min (A) (by C-G formula based on SCr of 1.33 mg/dL (H)). Recent Labs  Lab 03/28/20 1811  WBC 9.5  Liver Function Tests: No results for input(s): AST, ALT, ALKPHOS, BILITOT, PROT, ALBUMIN in the last 168 hours. No results for input(s): LIPASE, AMYLASE in the last 168 hours. No results for input(s): AMMONIA in the last 168 hours.  ABG No results found for: PHART, PCO2ART, PO2ART, HCO3, TCO2, ACIDBASEDEF, O2SAT   Coagulation Profile: No results for input(s): INR, PROTIME in the last 168 hours.  Cardiac Enzymes: No results for input(s): CKTOTAL, CKMB, CKMBINDEX, TROPONINI in the last 168 hours.  HbA1C: No results found for: HGBA1C  CBG: No results for input(s): GLUCAP in the last 168 hours.  Review of Systems:   Endorses chronic cough. Endoses SOB, improved Denies  nasal drainage Denies sick contacts Endorses sharp chest pain, improved. Denies Palpitations, edema Denies changes in appetite, weight Denies hair skin nail changes  Denies HA dizziness confusion   Past Medical History  He,  has a past medical history of Basal cell carcinoma (06/28/2015), Basal cell carcinoma (08/05/2017), CAD (coronary artery disease), Elevated PSA, Erectile dysfunction, GERD (gastroesophageal reflux disease), Hyperlipidemia, Hypertension, Mitral valve regurgitation, OSA treated with BiPAP, RLS (restless legs syndrome), Squamous cell carcinoma of skin (02/12/2017), and Tubular adenoma of colon.   Surgical History    Past Surgical History:  Procedure Laterality Date  . ANKLE SURGERY    . CHOLECYSTECTOMY    . CHOLECYSTECTOMY    . LEFT HEART CATH AND CORONARY ANGIOGRAPHY N/A 02/13/2019   Procedure: LEFT HEART CATH AND CORONARY ANGIOGRAPHY;  Surgeon: Troy Sine, MD;  Location: [facility];  Service: Cardiovascular;  Laterality: N/A;     Social History   reports that he quit smoking about 31 years ago. His smoking use included cigarettes. He has a 7.50 pack-year smoking history. He has never used smokeless tobacco. He reports that he does not drink alcohol and does not use drugs.   Family History   His family history includes Breast cancer in an other family member; Coronary artery disease in an other family member; Diabetes in an other family member.   Allergies No Known Allergies    Sherrilyn Rist, MD [place] [facility] Pager: [phone]

## 2020-03-30 ENCOUNTER — Inpatient Hospital Stay (HOSPITAL_COMMUNITY): Payer: Medicare Other

## 2020-03-30 DIAGNOSIS — J9383 Other pneumothorax: Principal | ICD-10-CM

## 2020-03-30 LAB — BASIC METABOLIC PANEL
Anion gap: 7 (ref 5–15)
BUN: 12 mg/dL (ref 8–23)
CO2: 26 mmol/L (ref 22–32)
Calcium: 9 mg/dL (ref 8.9–10.3)
Chloride: 108 mmol/L (ref 98–111)
Creatinine, Ser: 0.89 mg/dL (ref 0.61–1.24)
GFR calc Af Amer: >60 mL/min (ref >60)
GFR calc non Af Amer: >60 mL/min (ref >60)
Glucose, Bld: 112 mg/dL — ABNORMAL HIGH (ref 70–99)
Potassium: 4 mmol/L (ref 3.5–5.1)
Sodium: 141 mmol/L (ref 135–145)

## 2020-03-30 LAB — CBC
HCT: 45.5 % (ref 39.0–52.0)
Hemoglobin: 14.9 g/dL (ref 13.0–17.0)
MCH: 31 pg (ref 26.0–34.0)
MCHC: 32.7 g/dL (ref 30.0–36.0)
MCV: 94.6 fL (ref 80.0–100.0)
Platelets: 170 10*3/uL (ref 150–400)
RBC: 4.81 MIL/uL (ref 4.22–5.81)
RDW: 13.2 % (ref 11.5–15.5)
WBC: 7.8 10*3/uL (ref 4.0–10.5)
nRBC: 0 % (ref 0.0–0.2)

## 2020-03-30 MED ORDER — POLYETHYLENE GLYCOL 3350 17 G PO PACK: 17.0000 g | PACK | Freq: Every day | ORAL | Status: DC

## 2020-03-30 NOTE — Progress Notes (Addendum)
Dr. Doristine Bosworth removed chest tube at 1045 this am.   1420: Patient readied for discharge. PIV removed. Chest  Tube dressing dry and intact. Dressed, belongings returned And verified by patient. Waiting on ride. Denies shortness of Breath or chest pain. Some residual soreness in chest. Reviewed  AVS and placed with belongings. Pt takes some medications differently At home. Told to discuss with PCP if has any questions. Reviewed follow up care and things to call provider or emergent care for. Pt verbalizes understanding.  Simmie Davies RN

## 2020-03-30 NOTE — Progress Notes (Signed)
Pt to be discharged. PIV removed prior to discharge. Site without infiltration/redness/edema. Simmie Davies RN

## 2020-03-30 NOTE — Discharge Summary (Signed)
Physician Discharge Summary  Kale Rondeau LKT:[id number] DOB: [birth year] DOA: 03/28/2020  PCP: [name], MD  Admit date: 03/28/2020 Discharge date: 03/30/2020  Admitted From: Home Disposition: Home  Recommendations for Outpatient Follow-up:  1. Follow up with PCP in 1-2 weeks 2. Resume CPAP 48 hours from now. 3. Keep dressing for 7 days and no batting. 4. Follow with pulmonology in 4 weeks 5. Please obtain BMP/CBC in one week 6. Please follow up with your PCP on the following pending results: Unresulted Labs (From admission, onward) Comment         None       Home Health: None Equipment/Devices: None  Discharge Condition: Stable CODE STATUS: Full code Diet recommendation: Cardiac  Subjective: Seen and examined.  Feels much better.  No complaints.  Brief/Interim Summary: Patient is a pleasant 74 year old gentleman, history of COPD, OSA on CPAP, coronary artery disease, hypertension, GERD presented to the ED with complaints of right-sided chest pain that suddenly awoken patient from sleep at night with some associated shortness of breath.  Patient went to PCPs and subsequently directed to the ED.  Patient seen in the ED and noted to have a right-sided pneumothorax.  Chest tube placed by ED provider.  Follow-up chest x-ray with reexpansion of the lung. PCCM consulted for chest tube management.  Hospitalist consulted to admit patient.  Patient improved significantly over the last few days.  He is to stay but has been pulled out today by PCCM and he has been cleared for discharge.  Patient remains asymptomatic.  Chest x-ray improved.  He will follow with PCCM in few weeks.  Discharge Diagnoses:  Principal Problem:   Spontaneous pneumothorax Active Problems:   GERD (gastroesophageal reflux disease)   CAD (coronary artery disease)   Hypertension   Hyperlipidemia   RLS (restless legs syndrome)   COPD (chronic obstructive pulmonary disease) ([place])    Discharge  Instructions   Allergies as of 03/30/2020   No Known Allergies     Medication List    STOP taking these medications   benzonatate 100 MG capsule Commonly known as: TESSALON   fluticasone 50 MCG/ACT nasal spray Commonly known as: FLONASE   furosemide 20 MG tablet Commonly known as: LASIX   valACYclovir 1000 MG tablet Commonly known as: VALTREX     TAKE these medications   Allergy 4 MG tablet Generic drug: chlorpheniramine Take 8 mg by mouth at bedtime.   amLODipine 2.5 MG tablet Commonly known as: NORVASC Take 1 tablet (2.5 mg total) by mouth daily.   aspirin EC 81 MG tablet Take 1 tablet (81 mg total) by mouth daily.   atorvastatin 80 MG tablet Commonly known as: LIPITOR TAKE 1 TABLET BY MOUTH EVERY DAY   finasteride 5 MG tablet Commonly known as: Proscar Take 1 tablet (5 mg total) by mouth daily.   HYDROcodone-homatropine 5-1.5 MG/5ML syrup Commonly known as: HYCODAN Take 5 mLs by mouth every 8 (eight) hours as needed for cough.   melatonin 5 MG Tabs Take 5 mg by mouth at bedtime.   metoprolol tartrate 25 MG tablet Commonly known as: LOPRESSOR TAKE 1 TABLET BY MOUTH TWICE A DAY What changed: when to take this   multivitamin with minerals Tabs tablet Take 1 tablet by mouth in the morning and at bedtime.   pantoprazole 40 MG tablet Commonly known as: PROTONIX Take 1 tablet (40 mg total) by mouth 2 (two) times daily.   pramipexole 0.25 MG tablet Commonly known as: MIRAPEX TAKE 2 TABLETS  BY MOUTH AT BEDTIME What changed:   how much to take  how to take this  when to take this  additional instructions   tadalafil 5 MG tablet Commonly known as: CIALIS TAKE ONE TABLET BY MOUTH DAILY   tiZANidine 4 MG tablet Commonly known as: Zanaflex Take 1 tablet (4 mg total) by mouth every 8 (eight) hours as needed for muscle spasms.       Follow-up Information    [name], MD. Call in 1 week(s).   Specialty: Family Medicine Contact  information: [address] [place] [address] [phone]        Troy Sine, MD .   Specialty: Cardiology Contact information: [address] [place] [address] [phone]        Laurin Coder, MD Follow up on 05/18/2020.   Specialty: Pulmonary Disease Why: at 3:00pm. Please arrive 15 min prior.  Contact information: [address]  [place] [address] [phone]              No Known Allergies  Consultations: PCCM   Procedures/Studies: DG Chest 2 View  Result Date: 03/28/2020 CLINICAL DATA:  Chest pain, shortness of breath EXAM: CHEST - 2 VIEW COMPARISON:  10/30/2018 FINDINGS: There is a large right pneumothorax laterally, approximately 50% in size. Atelectasis throughout the compressed right lung. No confluent opacity on the left. Emphysema. Heart is normal size. No acute bony abnormality. IMPRESSION: Large, approximately 50% right pneumothorax. No evidence of tension. Emphysema. Critical Value/emergent results were called by telephone at the time of interpretation on 03/28/2020 at 6:48 pm to provider Dr. Roslynn Amble , who verbally acknowledged these results. Electronically Signed   By: Rolm Baptise M.D.   On: 03/28/2020 18:50   DG Chest Port 1 View  Result Date: 03/30/2020 CLINICAL DATA:  Follow-up pneumothorax. EXAM: PORTABLE CHEST 1 VIEW COMPARISON:  03/29/2020 FINDINGS: Stable right-sided chest tube. No definite pneumothorax. Stable severe chronic lung changes. No definite infiltrates or effusions. IMPRESSION: Stable right-sided chest tube without definite pneumothorax. Electronically Signed   By: Marijo Sanes M.D.   On: 03/30/2020 08:14   DG CHEST PORT 1 VIEW  Result Date: 03/29/2020 CLINICAL DATA:  History of pneumothorax.  Chest tube. EXAM: PORTABLE CHEST 1 VIEW COMPARISON:  03/28/2020. FINDINGS: Right chest tube stable position. No pneumothorax. Persistent bibasilar subsegmental atelectasis. Persistent biapical  pleuroparenchymal thickening consistent with scarring. Heart size normal. Degenerative change thoracic spine. IMPRESSION: 1.  Right chest tube in stable position.  No pneumothorax. 2. Persistent bibasilar atelectasis and or scarring. No acute infiltrate. Persistent biapical pleural thickening consistent with scarring. Electronically Signed   By: Marcello Moores  Register   On: 03/29/2020 09:25   DG Chest Portable 1 View  Result Date: 03/28/2020 CLINICAL DATA:  Chest tube placement EXAM: PORTABLE CHEST 1 VIEW COMPARISON:  03/28/2020 FINDINGS: Interval placement of right chest tube. Re-expansion of the right lung. No visible residual pneumothorax. Right base atelectasis. Left lung clear. Heart is normal size. IMPRESSION: Interval placement of right chest tube with re-expansion of the right lung. No visible residual pneumothorax. Right base atelectasis. Electronically Signed   By: Rolm Baptise M.D.   On: 03/28/2020 20:08      Discharge Exam: Vitals:   03/30/20 0750 03/30/20 1111  BP: 138/75 (!) 152/84  Pulse: 78 75  Resp: 19 18  Temp: 98.3 F (36.8 C) 98.2 F (36.8 C)  SpO2: 94% 98%   Vitals:   03/30/20 0746 03/30/20 0748  03/30/20 0750 03/30/20 1111  BP:   138/75 (!) 152/84  Pulse:   78 75  Resp:   19 18  Temp:   98.3 F (36.8 C) 98.2 F (36.8 C)  TempSrc:   Oral Oral  SpO2: 96% 95% 94% 98%  Weight:      Height:        General: Pt is alert, awake, not in acute distress Cardiovascular: RRR, S1/S2 +, no rubs, no gallops Respiratory: CTA bilaterally, no wheezing, no rhonchi Abdominal: Soft, NT, ND, bowel sounds + Extremities: no edema, no cyanosis    The results of significant diagnostics from this hospitalization (including imaging, microbiology, ancillary and laboratory) are listed below for reference.     Microbiology: Recent Results (from the past 240 hour(s))  SARS Coronavirus 2 by RT PCR (hospital order, performed in [facility] hospital lab) Nasopharyngeal Nasopharyngeal Swab      Status: None   Collection Time: 03/28/20  7:22 PM   Specimen: Nasopharyngeal Swab  Result Value Ref Range Status   SARS Coronavirus 2 NEGATIVE NEGATIVE Final    Comment: (NOTE) SARS-CoV-2 target nucleic acids are NOT DETECTED.  The SARS-CoV-2 RNA is generally detectable in upper and lower respiratory specimens during the acute phase of infection. The lowest concentration of SARS-CoV-2 viral copies this assay can detect is 250 copies / mL. A negative result does not preclude SARS-CoV-2 infection and should not be used as the sole basis for treatment or other patient management decisions.  A negative result may occur with improper specimen collection / handling, submission of specimen other than nasopharyngeal swab, presence of viral mutation(s) within the areas targeted by this assay, and inadequate number of viral copies (<250 copies / mL). A negative result must be combined with clinical observations, patient history, and epidemiological information.  Fact Sheet for Patients:   StrictlyIdeas.no  Fact Sheet for Healthcare Providers: BankingDealers.co.za  This test is not yet approved or  cleared by the [place] FDA and has been authorized for detection and/or diagnosis of SARS-CoV-2 by FDA under an Emergency Use Authorization (EUA).  This EUA will remain in effect (meaning this test can be used) for the duration of the COVID-19 declaration under Section 564(b)(1) of the Act, 21 U.S.C. section 360bbb-3(b)(1), unless the authorization is terminated or revoked sooner.  Performed at [facility], [place] [address] [place], [place] [address]   MRSA PCR Screening     Status: None   Collection Time: 03/29/20  6:30 PM   Specimen: Nasal Mucosa; Nasopharyngeal  Result Value Ref Range Status   MRSA by PCR NEGATIVE NEGATIVE Final    Comment:        The GeneXpert MRSA Assay (FDA approved for NASAL specimens only), is one  component of a comprehensive MRSA colonization surveillance program. It is not intended to diagnose MRSA infection nor to guide or monitor treatment for MRSA infections. Performed at [facility], [place] [address], [place], [place] [address]      Labs: BNP (last 3 results) No results for input(s): BNP in the last 8760 hours. Basic Metabolic Panel: Recent Labs  Lab 03/28/20 1811 03/29/20 1015 03/30/20 0824  NA 142 139 141  K 3.7 3.6 4.0  CL 107 108 108  CO2 25 23 26   GLUCOSE 103* 117* 112*  BUN 17 14 12   CREATININE 1.33* 0.94 0.89  CALCIUM 8.9 8.8* 9.0  MG  --  1.9  --   PHOS  --  3.0  --  Liver Function Tests: Recent Labs  Lab 03/29/20 1015  AST 20  ALT 18  ALKPHOS 98  BILITOT 1.0  PROT 7.1  ALBUMIN 3.8   No results for input(s): LIPASE, AMYLASE in the last 168 hours. No results for input(s): AMMONIA in the last 168 hours. CBC: Recent Labs  Lab 03/28/20 1811 03/29/20 1015 03/30/20 0824  WBC 9.5 8.6 7.8  NEUTROABS  --  6.7  --   HGB 15.1 14.9 14.9  HCT 46.6 46.3 45.5  MCV 95.7 95.3 94.6  PLT 186 170 170   Cardiac Enzymes: No results for input(s): CKTOTAL, CKMB, CKMBINDEX, TROPONINI in the last 168 hours. BNP: Invalid input(s): POCBNP CBG: No results for input(s): GLUCAP in the last 168 hours. D-Dimer No results for input(s): DDIMER in the last 72 hours. Hgb A1c No results for input(s): HGBA1C in the last 72 hours. Lipid Profile No results for input(s): CHOL, HDL, LDLCALC, TRIG, CHOLHDL, LDLDIRECT in the last 72 hours. Thyroid function studies Recent Labs    03/29/20 1015  TSH 1.904   Anemia work up No results for input(s): VITAMINB12, FOLATE, FERRITIN, TIBC, IRON, RETICCTPCT in the last 72 hours. Urinalysis No results found for: COLORURINE, APPEARANCEUR, [place], [name], [place], [place], [place], [place], PROTEINUR, UROBILINOGEN, NITRITE, LEUKOCYTESUR Sepsis Labs Invalid input(s): PROCALCITONIN,  WBC,   LACTICIDVEN Microbiology Recent Results (from the past 240 hour(s))  SARS Coronavirus 2 by RT PCR (hospital order, performed in [facility] hospital lab) Nasopharyngeal Nasopharyngeal Swab     Status: None   Collection Time: 03/28/20  7:22 PM   Specimen: Nasopharyngeal Swab  Result Value Ref Range Status   SARS Coronavirus 2 NEGATIVE NEGATIVE Final    Comment: (NOTE) SARS-CoV-2 target nucleic acids are NOT DETECTED.  The SARS-CoV-2 RNA is generally detectable in upper and lower respiratory specimens during the acute phase of infection. The lowest concentration of SARS-CoV-2 viral copies this assay can detect is 250 copies / mL. A negative result does not preclude SARS-CoV-2 infection and should not be used as the sole basis for treatment or other patient management decisions.  A negative result may occur with improper specimen collection / handling, submission of specimen other than nasopharyngeal swab, presence of viral mutation(s) within the areas targeted by this assay, and inadequate number of viral copies (<250 copies / mL). A negative result must be combined with clinical observations, patient history, and epidemiological information.  Fact Sheet for Patients:   StrictlyIdeas.no  Fact Sheet for Healthcare Providers: BankingDealers.co.za  This test is not yet approved or  cleared by the [place] FDA and has been authorized for detection and/or diagnosis of SARS-CoV-2 by FDA under an Emergency Use Authorization (EUA).  This EUA will remain in effect (meaning this test can be used) for the duration of the COVID-19 declaration under Section 564(b)(1) of the Act, 21 U.S.C. section 360bbb-3(b)(1), unless the authorization is terminated or revoked sooner.  Performed at [facility], [place] [address], [place], [place] [address]   MRSA PCR Screening     Status: None   Collection Time: 03/29/20  6:30 PM   Specimen: Nasal  Mucosa; Nasopharyngeal  Result Value Ref Range Status   MRSA by PCR NEGATIVE NEGATIVE Final    Comment:        The GeneXpert MRSA Assay (FDA approved for NASAL specimens only), is one component of a comprehensive MRSA colonization surveillance program. It is not intended to diagnose MRSA infection nor to guide or monitor treatment for MRSA infections. Performed  at [facility], [place] [address], [place], [place] [address]      Time coordinating discharge: Over 30 minutes  SIGNED:   Darliss Cheney, MD  [facility] 03/30/2020, 12:17 PM  If 7PM-7AM, please contact night-coverage [email or web address]

## 2020-03-30 NOTE — Consult Note (Signed)
NAME:  Gregory Suarez, MRN:  [id number], DOB:  [birth year], LOS: 2 ADMISSION DATE:  03/28/2020, CONSULTATION DATE:  03/29/20 REFERRING MD:  Lianne Bushy - TRH, CHIEF COMPLAINT:  R sided chest pain with SOB / Spontaneous PTX   Brief History   74 yo M hx COPD OSA on CPAP with acute R sided chest pain. Referred to ED by PCP, found to have R PTX. Pigtail placed by ED, PCCm for CT management   History of present illness   74 yo M PMH COPD (followed by Dr. Gwenette Greet), OSA (CPAP by Dr. Claiborne Billings), CAD, HTN, GERD, who presented to ED 6/28 with CC R sided chest pain. Pain was sudden onset and awoke patient from sleep. Associated symptoms include SOB. He initially presented to PCP, who referred patient to ED for further workup and care. In ED he was noted to have R sided PTX. A pigtail chest tube placed by EM provider, follow up CXR with re-inflation of R lung.   Admitted to [facility], PCCM for CT management.   Past Medical History  COPD Tobacco abuse OSA HTN HLD CAD  GERD   Significant Hospital Events   6/28> R pigtail placed by ED  Consults:  PCCM  Procedures:  6/28 R pigtail   Significant Diagnostic Tests:  CXR 6/28 R PTX CXT 6/28 Interval placement of R pigtail chest tube  Micro Data:    Antimicrobials:  None Interim history/subjective:  No dyspnea. Still chest pain at site of tube.   Objective   Blood pressure 138/75, pulse 78, temperature 98.3 F (36.8 C), temperature source Oral, resp. rate 19, height 6\' 3"  (1.905 m), weight 113.9 kg, SpO2 94 %.        Intake/Output Summary (Last 24 hours) at 03/30/2020 1057 Last data filed at 03/30/2020 0750 Gross per 24 hour  Intake --  Output 1430 ml  Net -1430 ml   Filed Weights   03/29/20 1857  Weight: 113.9 kg    Examination: General: Chronically ill appearing older adult M NAD  HENT: NCAT pink mmm trachea midline  Lungs: R pigtail in place no air leak on water seal. Chest clear following removal of tube Cardiovascular: RRR s1s2 cap refill  < 3 seconds  Abdomen: soft round ndnt Extremities: No obvious joint deformity. No BUE cyanosis or clubbing Neuro: AOOx4 following commands GU: Defer   Resolved Hospital Problem list     Assessment & Plan:   R sided pneumothorax, spontaneous  Hx COPD Hx OSA   Chest tube removed. Dressing applied. Keep dressing for 7days, no bathing, Resume CPAP in 48h.  Will arrange follow-up with Dr Ander Slade in 4 weeks  Advised that PTx may recur but not clear when or how often. No further work-up necessary at this time.    Labs   CBC: Recent Labs  Lab 03/28/20 1811 03/29/20 1015 03/30/20 0824  WBC 9.5 8.6 7.8  NEUTROABS  --  6.7  --   HGB 15.1 14.9 14.9  HCT 46.6 46.3 45.5  MCV 95.7 95.3 94.6  PLT 186 170 427    Basic Metabolic Panel: Recent Labs  Lab 03/28/20 1811 03/29/20 1015 03/30/20 0824  NA 142 139 141  K 3.7 3.6 4.0  CL 107 108 108  CO2 25 23 26   GLUCOSE 103* 117* 112*  BUN 17 14 12   CREATININE 1.33* 0.94 0.89  CALCIUM 8.9 8.8* 9.0  MG  --  1.9  --   PHOS  --  3.0  --    GFR:  Estimated Creatinine Clearance: 100.7 mL/min (by C-G formula based on SCr of 0.89 mg/dL). Recent Labs  Lab 03/28/20 1811 03/29/20 1015 03/30/20 0824  WBC 9.5 8.6 7.8    Liver Function Tests: Recent Labs  Lab 03/29/20 1015  AST 20  ALT 18  ALKPHOS 98  BILITOT 1.0  PROT 7.1  ALBUMIN 3.8   No results for input(s): LIPASE, AMYLASE in the last 168 hours. No results for input(s): AMMONIA in the last 168 hours.  ABG No results found for: PHART, PCO2ART, PO2ART, HCO3, TCO2, ACIDBASEDEF, O2SAT   Coagulation Profile: No results for input(s): INR, PROTIME in the last 168 hours.  Cardiac Enzymes: No results for input(s): CKTOTAL, CKMB, CKMBINDEX, TROPONINI in the last 168 hours.  HbA1C: No results found for: HGBA1C  CBG: No results for input(s): GLUCAP in the last 168 hours.  Kipp Brood, MD [facility] ICU Physician [place]  Pager: [phone] Mobile:  [phone] After hours: [phone].  03/30/2020, 11:01 AM

## 2020-03-30 NOTE — Discharge Instructions (Signed)
Pneumothorax A pneumothorax is commonly called a collapsed lung. It is a condition in which air leaks from a lung and builds up between the thin layer of tissue that covers the lungs (visceral pleura) and the interior wall of the chest cavity (parietal pleura). The air gets trapped outside the lung, between the lung and the chest wall (pleural space). The air takes up space and prevents the lung from fully expanding. This condition sometimes occurs suddenly with no apparent cause. The buildup of air may be small or large. A small pneumothorax may go away on its own. A large pneumothorax will require treatment and hospitalization. What are the causes? This condition may be caused by:  Trauma and injury to the chest wall.  Surgery and other medical procedures.  A complication of an underlying lung problem, especially chronic obstructive pulmonary disease (COPD) or emphysema. Sometimes the cause of this condition is not known. What increases the risk? You are more likely to develop this condition if:  You have an underlying lung problem.  You smoke.  You are 20-40 years old, male, tall, and underweight.  You have a personal or family history of pneumothorax.  You have an eating disorder (anorexia nervosa). This condition can also happen quickly, even in people with no history of lung problems. What are the signs or symptoms? Sometimes a pneumothorax will have no symptoms. When symptoms are present, they can include:  Chest pain.  Shortness of breath.  Increased rate of breathing.  Bluish color to your lips or skin (cyanosis). How is this diagnosed? This condition may be diagnosed by:  A medical history and physical exam.  A chest X-ray, chest CT scan, or ultrasound. How is this treated? Treatment depends on how severe your condition is. The goal of treatment is to remove the extra air and allow your lung to expand back to its normal size.  For a small pneumothorax: ? No  treatment may be needed. ? Extra oxygen is sometimes used to make it go away more quickly.  For a large pneumothorax or a pneumothorax that is causing symptoms, a procedure is done to drain the air from your lungs. To do this, a health care provider may use: ? A needle with a syringe. This is used to suck air from a pleural space where no additional leakage is taking place. ? A chest tube. This is used to suck air where there is ongoing leakage into the pleural space. The chest tube may need to remain in place for several days until the air leak has healed.  In more severe cases, surgery may be needed to repair the damage that is causing the leak.  If you have multiple pneumothorax episodes or have an air leak that will not heal, a procedure called a pleurodesis may be done. A medicine is placed in the pleural space to irritate the tissues around the lung so that the lung will stick to the chest wall, seal any leaks, and stop any buildup of air in that space. If you have an underlying lung problem, severe symptoms, or a large pneumothorax you will usually need to stay in the hospital. Follow these instructions at home: Lifestyle  Do not use any products that contain nicotine or tobacco, such as cigarettes and e-cigarettes. These are major risk factors in pneumothorax. If you need help quitting, ask your health care provider.  Do not lift anything that is heavier than 10 lb (4.5 kg), or the limit that your health care   provider tells you, until he or she says that it is safe.  Avoid activities that take a lot of effort (strenuous) for as long as told by your health care provider.  Return to your normal activities as told by your health care provider. Ask your health care provider what activities are safe for you.  Do not fly in an airplane or scuba dive until your health care provider says it is okay. General instructions  Take over-the-counter and prescription medicines only as told by your  health care provider.  If a cough or pain makes it difficult for you to sleep at night, try sleeping in a semi-upright position in a recliner or by using 2 or 3 pillows.  If you had a chest tube and it was removed, ask your health care provider when you can remove the bandage (dressing). While the dressing is in place, do not allow it to get wet.  Keep all follow-up visits as told by your health care provider. This is important. Contact a health care provider if:  You cough up thick mucus (sputum) that is yellow or green in color.  You were treated with a chest tube, and you have redness, increasing pain, or discharge at the site where it was placed. Get help right away if:  You have increasing chest pain or shortness of breath.  You have a cough that will not go away.  You begin coughing up blood.  You have pain that is getting worse or is not controlled with medicines.  The site where your chest tube was located opens up.  You feel air coming out of the site where the chest tube was placed.  You have a fever or persistent symptoms for more than 2-3 days.  You have a fever and your symptoms suddenly get worse. These symptoms may represent a serious problem that is an emergency. Do not wait to see if the symptoms will go away. Get medical help right away. Call your local emergency services (911 in the [place] Do not drive yourself to the hospital. Summary  A pneumothorax, commonly called a collapsed lung, is a condition in which air leaks from a lung and gets trapped between the lung and the chest wall (pleural space).  The buildup of air may be small or large. A small pneumothorax may go away on its own. A large pneumothorax will require treatment and hospitalization.  Treatment for this condition depends on how severe the pneumothorax is. The goal of treatment is to remove the extra air and allow the lung to expand back to its normal size. This information is not intended to  replace advice given to you by your health care provider. Make sure you discuss any questions you have with your health care provider. Document Revised: 08/30/2017 Document Reviewed: 08/26/2017 Elsevier Patient Education  2020 [employer]  

## 2020-04-07 ENCOUNTER — Encounter (HOSPITAL_COMMUNITY): Payer: Self-pay | Admitting: Student

## 2020-04-07 ENCOUNTER — Inpatient Hospital Stay (HOSPITAL_COMMUNITY): Payer: Medicare Other

## 2020-04-07 ENCOUNTER — Inpatient Hospital Stay (HOSPITAL_COMMUNITY)
Admission: EM | Admit: 2020-04-07 | Discharge: 2020-04-18 | DRG: 167 | Disposition: A | Discharge status: Home / Self Care | Payer: Medicare Other | Attending: Pulmonary Disease | Admitting: Pulmonary Disease

## 2020-04-07 ENCOUNTER — Ambulatory Visit
Admission: RE | Admit: 2020-04-07 | Discharge: 2020-04-07 | Disposition: A | Discharge status: Home / Self Care | Payer: Medicare Other | Source: Ambulatory Visit | Attending: Family Medicine | Admitting: Family Medicine

## 2020-04-07 ENCOUNTER — Ambulatory Visit (INDEPENDENT_AMBULATORY_CARE_PROVIDER_SITE_OTHER): Payer: Medicare Other | Admitting: Family Medicine

## 2020-04-07 ENCOUNTER — Other Ambulatory Visit: Payer: Self-pay

## 2020-04-07 VITALS — BP 130/70 | HR 74 | Temp 97.4°F | Ht 75.0 in | Wt 245.0 lb

## 2020-04-07 DIAGNOSIS — N529 Male erectile dysfunction, unspecified: Secondary | ICD-10-CM | POA: Diagnosis present

## 2020-04-07 DIAGNOSIS — Z4682 Encounter for fitting and adjustment of non-vascular catheter: Secondary | ICD-10-CM

## 2020-04-07 DIAGNOSIS — Z20822 Contact with and (suspected) exposure to covid-19: Secondary | ICD-10-CM | POA: Diagnosis present

## 2020-04-07 DIAGNOSIS — J449 Chronic obstructive pulmonary disease, unspecified: Secondary | ICD-10-CM | POA: Diagnosis present

## 2020-04-07 DIAGNOSIS — J9382 Other air leak: Secondary | ICD-10-CM | POA: Diagnosis not present

## 2020-04-07 DIAGNOSIS — G2581 Restless legs syndrome: Secondary | ICD-10-CM | POA: Diagnosis present

## 2020-04-07 DIAGNOSIS — Z7982 Long term (current) use of aspirin: Secondary | ICD-10-CM | POA: Diagnosis not present

## 2020-04-07 DIAGNOSIS — Z85828 Personal history of other malignant neoplasm of skin: Secondary | ICD-10-CM | POA: Diagnosis not present

## 2020-04-07 DIAGNOSIS — J9811 Atelectasis: Secondary | ICD-10-CM | POA: Diagnosis not present

## 2020-04-07 DIAGNOSIS — I2510 Atherosclerotic heart disease of native coronary artery without angina pectoris: Secondary | ICD-10-CM | POA: Diagnosis present

## 2020-04-07 DIAGNOSIS — I10 Essential (primary) hypertension: Secondary | ICD-10-CM | POA: Diagnosis present

## 2020-04-07 DIAGNOSIS — E785 Hyperlipidemia, unspecified: Secondary | ICD-10-CM | POA: Diagnosis present

## 2020-04-07 DIAGNOSIS — Z79899 Other long term (current) drug therapy: Secondary | ICD-10-CM

## 2020-04-07 DIAGNOSIS — Z8249 Family history of ischemic heart disease and other diseases of the circulatory system: Secondary | ICD-10-CM | POA: Diagnosis not present

## 2020-04-07 DIAGNOSIS — J942 Hemothorax: Secondary | ICD-10-CM

## 2020-04-07 DIAGNOSIS — Z87891 Personal history of nicotine dependence: Secondary | ICD-10-CM

## 2020-04-07 DIAGNOSIS — J9690 Respiratory failure, unspecified, unspecified whether with hypoxia or hypercapnia: Secondary | ICD-10-CM

## 2020-04-07 DIAGNOSIS — J8489 Other specified interstitial pulmonary diseases: Secondary | ICD-10-CM | POA: Diagnosis not present

## 2020-04-07 DIAGNOSIS — J984 Other disorders of lung: Secondary | ICD-10-CM | POA: Diagnosis not present

## 2020-04-07 DIAGNOSIS — R21 Rash and other nonspecific skin eruption: Secondary | ICD-10-CM | POA: Diagnosis not present

## 2020-04-07 DIAGNOSIS — I472 Ventricular tachycardia: Secondary | ICD-10-CM | POA: Diagnosis not present

## 2020-04-07 DIAGNOSIS — J431 Panlobular emphysema: Secondary | ICD-10-CM | POA: Diagnosis not present

## 2020-04-07 DIAGNOSIS — J439 Emphysema, unspecified: Secondary | ICD-10-CM | POA: Diagnosis not present

## 2020-04-07 DIAGNOSIS — J939 Pneumothorax, unspecified: Secondary | ICD-10-CM | POA: Diagnosis present

## 2020-04-07 DIAGNOSIS — G4733 Obstructive sleep apnea (adult) (pediatric): Secondary | ICD-10-CM | POA: Diagnosis present

## 2020-04-07 DIAGNOSIS — J9383 Other pneumothorax: Secondary | ICD-10-CM

## 2020-04-07 DIAGNOSIS — J811 Chronic pulmonary edema: Secondary | ICD-10-CM | POA: Diagnosis not present

## 2020-04-07 DIAGNOSIS — R918 Other nonspecific abnormal finding of lung field: Secondary | ICD-10-CM | POA: Diagnosis not present

## 2020-04-07 DIAGNOSIS — R0602 Shortness of breath: Secondary | ICD-10-CM | POA: Diagnosis not present

## 2020-04-07 DIAGNOSIS — I340 Nonrheumatic mitral (valve) insufficiency: Secondary | ICD-10-CM | POA: Diagnosis present

## 2020-04-07 DIAGNOSIS — K219 Gastro-esophageal reflux disease without esophagitis: Secondary | ICD-10-CM | POA: Diagnosis present

## 2020-04-07 DIAGNOSIS — I517 Cardiomegaly: Secondary | ICD-10-CM | POA: Diagnosis not present

## 2020-04-07 LAB — MRSA PCR SCREENING: MRSA by PCR: NEGATIVE

## 2020-04-07 LAB — CBC
HCT: 49.6 % (ref 39.0–52.0)
Hemoglobin: 16.4 g/dL (ref 13.0–17.0)
MCH: 31.5 pg (ref 26.0–34.0)
MCHC: 33.1 g/dL (ref 30.0–36.0)
MCV: 95.2 fL (ref 80.0–100.0)
Platelets: 230 10*3/uL (ref 150–400)
RBC: 5.21 MIL/uL (ref 4.22–5.81)
RDW: 13.4 % (ref 11.5–15.5)
WBC: 10.4 10*3/uL (ref 4.0–10.5)
nRBC: 0 % (ref 0.0–0.2)

## 2020-04-07 LAB — BASIC METABOLIC PANEL
Anion gap: 12 (ref 5–15)
BUN: 17 mg/dL (ref 8–23)
CO2: 26 mmol/L (ref 22–32)
Calcium: 9.4 mg/dL (ref 8.9–10.3)
Chloride: 102 mmol/L (ref 98–111)
Creatinine, Ser: 0.94 mg/dL (ref 0.61–1.24)
GFR calc Af Amer: >60 mL/min (ref >60)
GFR calc non Af Amer: >60 mL/min (ref >60)
Glucose, Bld: 92 mg/dL (ref 70–99)
Potassium: 4.6 mmol/L (ref 3.5–5.1)
Sodium: 140 mmol/L (ref 135–145)

## 2020-04-07 LAB — SARS CORONAVIRUS 2 BY RT PCR (HOSPITAL ORDER, PERFORMED IN ~~LOC~~ HOSPITAL LAB): SARS Coronavirus 2: NEGATIVE

## 2020-04-07 MED ORDER — LACTATED RINGERS IV SOLN: INTRAVENOUS | Status: DC

## 2020-04-07 MED ORDER — KETOROLAC TROMETHAMINE 15 MG/ML IJ SOLN
15.0000 mg | Freq: Once | INTRAMUSCULAR | Status: AC
  Administered 2020-04-07: 15 mg via INTRAVENOUS

## 2020-04-07 MED ORDER — FUROSEMIDE 20 MG PO TABS
20.0000 mg | ORAL_TABLET | Freq: Every day | ORAL | Status: DC
  Administered 2020-04-08 – 2020-04-18 (×11): 20 mg via ORAL
  Filled 2020-04-07 (×12): qty 1

## 2020-04-07 MED ORDER — AMLODIPINE BESYLATE 5 MG PO TABS
2.5000 mg | ORAL_TABLET | Freq: Every day | ORAL | Status: DC
  Administered 2020-04-08 – 2020-04-18 (×11): 2.5 mg via ORAL
  Filled 2020-04-07 (×12): qty 1

## 2020-04-07 MED ORDER — ATORVASTATIN CALCIUM 40 MG PO TABS
80.0000 mg | ORAL_TABLET | Freq: Every day | ORAL | Status: DC
  Administered 2020-04-08 – 2020-04-18 (×11): 80 mg via ORAL
  Filled 2020-04-07 (×12): qty 2

## 2020-04-07 MED ORDER — KETOROLAC TROMETHAMINE 30 MG/ML IJ SOLN
INTRAMUSCULAR | Status: AC
  Filled 2020-04-07: qty 1

## 2020-04-07 MED ORDER — POLYETHYLENE GLYCOL 3350 17 G PO PACK
17.0000 g | PACK | Freq: Every day | ORAL | Status: DC | PRN
  Administered 2020-04-10 – 2020-04-15 (×2): 17 g via ORAL
  Filled 2020-04-07 (×2): qty 1

## 2020-04-07 MED ORDER — LIDOCAINE HCL (PF) 1 % IJ SOLN
INTRAMUSCULAR | Status: AC
  Filled 2020-04-07: qty 30

## 2020-04-07 MED ORDER — ONDANSETRON HCL 4 MG/2ML IJ SOLN
INTRAMUSCULAR | Status: AC
  Filled 2020-04-07: qty 2

## 2020-04-07 MED ORDER — ASPIRIN EC 81 MG PO TBEC
81.0000 mg | DELAYED_RELEASE_TABLET | Freq: Every day | ORAL | Status: DC
  Administered 2020-04-08 – 2020-04-18 (×11): 81 mg via ORAL
  Filled 2020-04-07 (×12): qty 1

## 2020-04-07 MED ORDER — FINASTERIDE 5 MG PO TABS
5.0000 mg | ORAL_TABLET | Freq: Every day | ORAL | Status: DC
  Administered 2020-04-08 – 2020-04-18 (×11): 5 mg via ORAL
  Filled 2020-04-07 (×12): qty 1

## 2020-04-07 MED ORDER — PANTOPRAZOLE SODIUM 40 MG PO TBEC
40.0000 mg | DELAYED_RELEASE_TABLET | Freq: Two times a day (BID) | ORAL | Status: DC
  Administered 2020-04-07 – 2020-04-18 (×22): 40 mg via ORAL
  Filled 2020-04-07 (×22): qty 1

## 2020-04-07 MED ORDER — MELATONIN 5 MG PO TABS
5.0000 mg | ORAL_TABLET | Freq: Every day | ORAL | Status: DC
  Administered 2020-04-07 – 2020-04-12 (×6): 5 mg via ORAL
  Filled 2020-04-07 (×6): qty 1

## 2020-04-07 MED ORDER — MORPHINE SULFATE (PF) 2 MG/ML IV SOLN
INTRAVENOUS | Status: AC
  Administered 2020-04-07: 1 mg via INTRAVENOUS
  Filled 2020-04-07: qty 1

## 2020-04-07 MED ORDER — MORPHINE SULFATE (PF) 2 MG/ML IV SOLN
1.0000 mg | INTRAVENOUS | Status: DC | PRN
  Administered 2020-04-07 (×2): 2 mg via INTRAVENOUS
  Administered 2020-04-07: 1 mg via INTRAVENOUS
  Administered 2020-04-08 – 2020-04-14 (×4): 2 mg via INTRAVENOUS
  Filled 2020-04-07 (×8): qty 1

## 2020-04-07 MED ORDER — TIZANIDINE HCL 4 MG PO TABS
4.0000 mg | ORAL_TABLET | Freq: Four times a day (QID) | ORAL | Status: DC | PRN
  Administered 2020-04-07: 4 mg via ORAL
  Filled 2020-04-07: qty 1

## 2020-04-07 MED ORDER — DOCUSATE SODIUM 100 MG PO CAPS
100.0000 mg | ORAL_CAPSULE | Freq: Two times a day (BID) | ORAL | Status: DC | PRN
  Administered 2020-04-08 – 2020-04-16 (×4): 100 mg via ORAL
  Filled 2020-04-07 (×5): qty 1

## 2020-04-07 MED ORDER — HYDROCODONE-HOMATROPINE 5-1.5 MG/5ML PO SYRP: 5.0000 mL | ORAL_SOLUTION | Freq: Three times a day (TID) | ORAL | Status: DC | PRN

## 2020-04-07 MED ORDER — CHLORHEXIDINE GLUCONATE CLOTH 2 % EX PADS
6.0000 | MEDICATED_PAD | Freq: Every day | CUTANEOUS | Status: DC
  Administered 2020-04-07 – 2020-04-16 (×9): 6 via TOPICAL

## 2020-04-07 MED ORDER — HEPARIN SODIUM (PORCINE) 5000 UNIT/ML IJ SOLN
5000.0000 [IU] | Freq: Three times a day (TID) | INTRAMUSCULAR | Status: DC
  Administered 2020-04-07 – 2020-04-18 (×31): 5000 [IU] via SUBCUTANEOUS
  Filled 2020-04-07 (×32): qty 1

## 2020-04-07 MED ORDER — SODIUM CHLORIDE 0.9% FLUSH
10.0000 mL | Freq: Three times a day (TID) | INTRAVENOUS | Status: DC
  Administered 2020-04-07 – 2020-04-11 (×12): 10 mL

## 2020-04-07 MED ORDER — ONDANSETRON HCL 4 MG/2ML IJ SOLN
4.0000 mg | Freq: Once | INTRAMUSCULAR | Status: AC
  Administered 2020-04-07: 4 mg via INTRAVENOUS

## 2020-04-07 MED ORDER — ADULT MULTIVITAMIN W/MINERALS CH
1.0000 | ORAL_TABLET | Freq: Every day | ORAL | Status: DC
  Administered 2020-04-08 – 2020-04-18 (×9): 1 via ORAL
  Filled 2020-04-07 (×12): qty 1

## 2020-04-07 MED ORDER — METOPROLOL TARTRATE 25 MG PO TABS
25.0000 mg | ORAL_TABLET | Freq: Two times a day (BID) | ORAL | Status: DC
  Administered 2020-04-07 – 2020-04-18 (×22): 25 mg via ORAL
  Filled 2020-04-07 (×13): qty 1
  Filled 2020-04-07: qty 2
  Filled 2020-04-07 (×8): qty 1

## 2020-04-07 MED ORDER — IPRATROPIUM-ALBUTEROL 0.5-2.5 (3) MG/3ML IN SOLN
3.0000 mL | Freq: Four times a day (QID) | RESPIRATORY_TRACT | Status: DC
  Administered 2020-04-07 – 2020-04-09 (×7): 3 mL via RESPIRATORY_TRACT
  Filled 2020-04-07 (×6): qty 3

## 2020-04-07 MED ORDER — PRAMIPEXOLE DIHYDROCHLORIDE 0.25 MG PO TABS
0.5000 mg | ORAL_TABLET | Freq: Every day | ORAL | Status: DC
  Administered 2020-04-07 – 2020-04-17 (×11): 0.5 mg via ORAL
  Filled 2020-04-07 (×11): qty 2

## 2020-04-07 MED ORDER — HYDROCODONE-ACETAMINOPHEN 7.5-325 MG PO TABS
1.0000 | ORAL_TABLET | Freq: Four times a day (QID) | ORAL | Status: DC | PRN
  Administered 2020-04-07 – 2020-04-10 (×6): 1 via ORAL
  Administered 2020-04-10: 2 via ORAL
  Administered 2020-04-11: 1 via ORAL
  Administered 2020-04-11 (×2): 2 via ORAL
  Administered 2020-04-12 – 2020-04-15 (×8): 1 via ORAL
  Filled 2020-04-07 (×3): qty 1
  Filled 2020-04-07 (×2): qty 2
  Filled 2020-04-07: qty 1
  Filled 2020-04-07 (×2): qty 2
  Filled 2020-04-07 (×4): qty 1
  Filled 2020-04-07: qty 2
  Filled 2020-04-07 (×5): qty 1

## 2020-04-07 NOTE — Progress Notes (Signed)
Subjective:    Patient ID: Gregory Suarez, male    DOB: 1946/05/11, 74 y.o.   MRN: [id number]  HPI  Recently admitted to the hospital and below is his DC summary: Admit date: 03/28/2020 Discharge date: 03/30/2020  Admitted From: Home Disposition: Home  Recommendations for Outpatient Follow-up:  1. Follow up with PCP in 1-2 weeks 2. Resume CPAP 48 hours from now. 3. Keep dressing for 7 days and no batting. 4. Follow with pulmonology in 4 weeks 5. Please obtain BMP/CBC in one week  Brief/Interim Summary: Patient is a pleasant 74 year old gentleman, history of COPD, OSA on CPAP, coronary artery disease, hypertension, GERD presented to the ED with complaints of right-sided chest pain that suddenly awoken patient from sleep at night with some associated shortness of breath. Patient went to PCPs and subsequently directed to the ED. Patient seen in the ED and noted to have a right-sided pneumothorax. Chest tube placed by ED provider. Follow-up chest x-ray with reexpansion of the lung. PCCM consulted for chest tube management. Hospitalist consulted to admit patient.  Patient improved significantly over the last few days.  He is to stay but has been pulled out today by PCCM and he has been cleared for discharge.  Patient remains asymptomatic.  Chest x-ray improved.  He will follow with PCCM in few weeks.  Discharge Diagnoses:  Principal Problem:   Spontaneous pneumothorax Active Problems:   GERD (gastroesophageal reflux disease)   CAD (coronary artery disease)   Hypertension   Hyperlipidemia   RLS (restless legs syndrome)   COPD (chronic obstructive pulmonary disease) ([place])   04/07/20 Patient reports that he has had labored breathing since leaving the hospital.  He states that he is getting short winded simply walking across the room.  Pulse oximetry is 92% on room air.  He does have increased respiratory rate even sitting still on the exam table.  He denies any fever.  He denies any  chills.  He denies any chest pain.  He denies any hemoptysis.  He denies any pleurisy.  He has had a cough productive of clear sputum however this is chronic for him.  On exam today however he has diminished breath sounds on the right side compared to the left.  This is diffuse in the upper mid and lower lung fields.  Patient does not demonstrate any respiratory distress.  Coincidental finding of a lesion on his right jaw just below the tragus.  Is approximately 7 to 8 mm in size.  It has raised rolled edges and central scarring.  Differential diagnosis would be basal cell carcinoma versus sebaceous hyperplasia.  I believe this needs a biopsy however his current respiratory status makes this something to be discussed at a later date   Past Medical History:  Diagnosis Date  . Basal cell carcinoma 06/28/2015   left temple  . Basal cell carcinoma 08/05/2017   right cheek inf to zygoma  . CAD (coronary artery disease)   . Elevated PSA   . Erectile dysfunction   . GERD (gastroesophageal reflux disease)   . Hyperlipidemia   . Hypertension   . Mitral valve regurgitation   . OSA treated with BiPAP   . RLS (restless legs syndrome)   . 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 Tubular adenoma of colon    Past Surgical History:  Procedure Laterality Date  . ANKLE SURGERY    . CHOLECYSTECTOMY    . CHOLECYSTECTOMY    . LEFT HEART  CATH AND CORONARY ANGIOGRAPHY N/A 02/13/2019   Procedure: LEFT HEART CATH AND CORONARY ANGIOGRAPHY;  Surgeon: Troy Sine, MD;  Location: [place] [facility];  Service: Cardiovascular;  Laterality: N/A;   Current Outpatient Medications on File Prior to Visit  Medication Sig Dispense Refill  . amLODipine (NORVASC) 2.5 MG tablet Take 1 tablet (2.5 mg total) by mouth daily. 90 tablet 3  . aspirin EC 81 MG tablet Take 1 tablet (81 mg total) by mouth daily. 90 tablet 3  . atorvastatin (LIPITOR) 80 MG tablet TAKE 1 TABLET BY MOUTH EVERY DAY (Patient  taking differently: Take 80 mg by mouth daily. ) 90 tablet 3  . chlorpheniramine (ALLERGY) 4 MG tablet Take 8 mg by mouth at bedtime.     . finasteride (PROSCAR) 5 MG tablet Take 1 tablet (5 mg total) by mouth daily. 90 tablet 3  . HYDROcodone-homatropine (HYCODAN) 5-1.5 MG/5ML syrup Take 5 mLs by mouth every 8 (eight) hours as needed for cough. (Patient not taking: Reported on 03/28/2020) 120 mL 0  . Melatonin 5 MG TABS Take 5 mg by mouth at bedtime.    . metoprolol tartrate (LOPRESSOR) 25 MG tablet TAKE 1 TABLET BY MOUTH TWICE A DAY (Patient taking differently: Take 25 mg by mouth. ) 180 tablet 2  . Multiple Vitamin (MULTIVITAMIN WITH MINERALS) TABS tablet Take 1 tablet by mouth in the morning and at bedtime.     . pantoprazole (PROTONIX) 40 MG tablet Take 1 tablet (40 mg total) by mouth 2 (two) times daily. 180 tablet 3  . pramipexole (MIRAPEX) 0.25 MG tablet TAKE 2 TABLETS BY MOUTH AT BEDTIME (Patient taking differently: Take 0.5 mg by mouth daily. ) 180 tablet 1  . tadalafil (CIALIS) 5 MG tablet TAKE ONE TABLET BY MOUTH DAILY (Patient taking differently: Take 5 mg by mouth daily. ) 90 tablet 2  . tiZANidine (ZANAFLEX) 4 MG tablet Take 1 tablet (4 mg total) by mouth every 8 (eight) hours as needed for muscle spasms. 30 tablet 1   Current Facility-Administered Medications on File Prior to Visit  Medication Dose Route Frequency Provider Last Rate Last Admin  . sodium chloride flush (NS) 0.9 % injection 3 mL  3 mL Intravenous Q12H Almyra Deforest, PA       No Known Allergies Social History   Socioeconomic History  . Marital status: Married    Spouse name: Not on file  . Number of children: Not on file  . Years of education: Not on file  . Highest education level: Not on file  Occupational History  . Occupation: RETIRED  Tobacco Use  . Smoking status: Former Smoker    Packs/day: 0.50    Years: 15.00    Pack years: 7.50    Types: Cigarettes    Quit date: 10/01/1988    Years since quitting:  31.5  . Smokeless tobacco: Never Used  Substance and Sexual Activity  . Alcohol use: No    Alcohol/week: 0.0 standard drinks  . Drug use: No  . Sexual activity: Yes    Comment: married, 2 grown sons, retired maintenance man  Other Topics Concern  . Not on file  Social History Narrative  . Not on file   Social Determinants of Health   Financial Resource Strain:   . Difficulty of Paying Living Expenses:   Food Insecurity:   . Worried About Charity fundraiser in the Last Year:   . [place] in the Last Year:  Transportation Needs:   . Film/video editor (Medical):   Marland Kitchen Lack of Transportation (Non-Medical):   Physical Activity:   . Days of Exercise per Week:   . Minutes of Exercise per Session:   Stress:   . Feeling of Stress :   Social Connections:   . Frequency of Communication with Friends and Family:   . Frequency of Social Gatherings with Friends and Family:   . Attends Religious Services:   . Active Member of Clubs or Organizations:   . Attends Archivist Meetings:   Marland Kitchen Marital Status:   Intimate Partner Violence:   . Fear of Current or Ex-Partner:   . Emotionally Abused:   Marland Kitchen Physically Abused:   . Sexually Abused:    Family History  Problem Relation Age of Onset  . Diabetes Other   . Breast cancer Other   . Coronary artery disease Other       Review of Systems  All other systems reviewed and are negative.      Objective:   Physical Exam Vitals reviewed.  Constitutional:      General: He is not in acute distress.    Appearance: He is well-developed. He is not diaphoretic.  HENT:     Head: Normocephalic and atraumatic.     Right Ear: External ear normal.     Left Ear: External ear normal.     Nose: Nose normal.     Mouth/Throat:     Pharynx: No oropharyngeal exudate.  Eyes:     General: No scleral icterus.       Right eye: No discharge.        Left eye: No discharge.     Conjunctiva/sclera: Conjunctivae normal.     Pupils:  Pupils are equal, round, and reactive to light.  Neck:     Thyroid: No thyromegaly.     Vascular: No JVD.     Trachea: No tracheal deviation.  Cardiovascular:     Rate and Rhythm: Normal rate and regular rhythm.     Heart sounds: Normal heart sounds. No murmur heard.  No friction rub. No gallop.   Pulmonary:     Effort: Pulmonary effort is normal. No accessory muscle usage, prolonged expiration or respiratory distress.     Breath sounds: No stridor. Examination of the right-upper field reveals decreased breath sounds. Examination of the right-middle field reveals decreased breath sounds. Examination of the right-lower field reveals decreased breath sounds. Decreased breath sounds present. No wheezing or rales.  Chest:     Chest wall: No tenderness.  Abdominal:     General: Bowel sounds are normal. There is no distension.     Palpations: Abdomen is soft. There is no mass.     Tenderness: There is no abdominal tenderness. There is no guarding or rebound.  Musculoskeletal:        General: No tenderness. Normal range of motion.     Cervical back: Normal range of motion and neck supple.  Lymphadenopathy:     Cervical: No cervical adenopathy.  Skin:    General: Skin is warm.     Coloration: Skin is not pale.     Findings: No erythema or rash.  Neurological:     Mental Status: He is alert and oriented to person, place, and time.     Cranial Nerves: No cranial nerve deficit.     Motor: No abnormal muscle tone.     Coordination: Coordination normal.     Deep Tendon Reflexes: Reflexes  are normal and symmetric.  Psychiatric:        Behavior: Behavior normal.        Thought Content: Thought content normal.        Judgment: Judgment normal.           Assessment & Plan:    Other pneumothorax - Plan: DG Chest 2 View  Patient has dyspnea, early fatigue, and diminished breath sounds on the right side.  I have a concern for recurrence of pneumothorax versus hospital acquired pneumonia.   Empyema would also be on the differential diagnosis given his recent instrumentation.  Therefore I recommended that the patient go immediately to [facility] imaging for chest x-ray.  Patient will likely be referred to the emergency room for chest tube placement if the pneumothorax has recurred.  Recommended a biopsy of the lesion on his right mandible however we will defer this at the present time.

## 2020-04-07 NOTE — ED Notes (Signed)
Per PCP, coming from imaging-states patient has a right pneumothorax

## 2020-04-07 NOTE — ED Provider Notes (Signed)
[facility] DEPT Provider Note   CSN: [id number] Arrival date & time: 04/07/20  4098     History Chief Complaint  Patient presents with  . Shortness of Breath  . Chest Injury    Gregory Suarez is a 74 y.o. male.  HPI Patient presents with a recurrent pneumothorax.  Around 2 weeks ago had been admitted to [facility] for spontaneous pneumo.  Had chest tube placed and managed by pulmonology.  Discharged around a week ago.  States he had had shortness of breath continuously but that may have gotten worse last couple days.  Saw PCP and had an x-ray done that showed return of his right pneumothorax.  Reportedly around 30%.  States more shortness of breath but not having chest pain like he did with the last time.    Past Medical History:  Diagnosis Date  . Basal cell carcinoma 06/28/2015   left temple  . Basal cell carcinoma 08/05/2017   right cheek inf to zygoma  . CAD (coronary artery disease)   . Elevated PSA   . Erectile dysfunction   . GERD (gastroesophageal reflux disease)   . Hyperlipidemia   . Hypertension   . Mitral valve regurgitation   . OSA treated with BiPAP   . RLS (restless legs syndrome)   . 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 Tubular adenoma of colon     Patient Active Problem List   Diagnosis Date Noted  . Spontaneous pneumothorax 03/28/2020  . COPD (chronic obstructive pulmonary disease) ([place]) 03/28/2020  . Tubular adenoma of colon   . RLS (restless legs syndrome)   . Abnormal cardiac CT angiography 02/27/2012  . Chronic cough 02/27/2012  . Hypertension 02/26/2012  . Hyperlipidemia 02/26/2012  . GERD (gastroesophageal reflux disease)   . CAD (coronary artery disease)   . Erectile dysfunction   . Mitral valve regurgitation     Past Surgical History:  Procedure Laterality Date  . ANKLE SURGERY    . CHOLECYSTECTOMY    . CHOLECYSTECTOMY    . LEFT HEART CATH AND CORONARY ANGIOGRAPHY N/A  02/13/2019   Procedure: LEFT HEART CATH AND CORONARY ANGIOGRAPHY;  Surgeon: Troy Sine, MD;  Location: [place] [facility];  Service: Cardiovascular;  Laterality: N/A;       Family History  Problem Relation Age of Onset  . Diabetes Other   . Breast cancer Other   . Coronary artery disease Other     Social History   Tobacco Use  . Smoking status: Former Smoker    Packs/day: 0.50    Years: 15.00    Pack years: 7.50    Types: Cigarettes    Quit date: 10/01/1988    Years since quitting: 31.5  . Smokeless tobacco: Never Used  Substance Use Topics  . Alcohol use: No    Alcohol/week: 0.0 standard drinks  . Drug use: No    Home Medications Prior to Admission medications   Medication Sig Start Date End Date Taking? Authorizing Provider  amLODipine (NORVASC) 2.5 MG tablet Take 1 tablet (2.5 mg total) by mouth daily. 01/19/20   Almyra Deforest, PA  aspirin EC 81 MG tablet Take 1 tablet (81 mg total) by mouth daily. 11/12/18   Troy Sine, MD  atorvastatin (LIPITOR) 80 MG tablet TAKE 1 TABLET BY MOUTH EVERY DAY Patient taking differently: Take 80 mg by mouth daily.  01/22/20   Bhagat, Crista Luria, PA  chlorpheniramine (ALLERGY) 4 MG tablet Take 8  mg by mouth at bedtime.     [provider]  finasteride (PROSCAR) 5 MG tablet Take 1 tablet (5 mg total) by mouth daily. 02/18/20   [name], MD  furosemide (LASIX) 20 MG tablet Take 20 mg by mouth daily. 04/06/20   [provider]  HYDROcodone-homatropine (HYCODAN) 5-1.5 MG/5ML syrup Take 5 mLs by mouth every 8 (eight) hours as needed for cough. 11/11/18   [name], MD  Melatonin 5 MG TABS Take 5 mg by mouth at bedtime.    [provider]  metoprolol tartrate (LOPRESSOR) 25 MG tablet TAKE 1 TABLET BY MOUTH TWICE A DAY Patient taking differently: Take 25 mg by mouth 2 (two) times daily.  11/30/19   [name], MD  Multiple Vitamin (MULTIVITAMIN WITH MINERALS) TABS tablet Take 1 tablet by mouth in the  morning and at bedtime.     [provider]  pantoprazole (PROTONIX) 40 MG tablet Take 1 tablet (40 mg total) by mouth 2 (two) times daily. 01/25/20   [name], MD  pramipexole (MIRAPEX) 0.25 MG tablet TAKE 2 TABLETS BY MOUTH AT BEDTIME Patient taking differently: Take 0.5 mg by mouth daily.  01/18/20   [name], MD  tadalafil (CIALIS) 5 MG tablet TAKE ONE TABLET BY MOUTH DAILY Patient taking differently: Take 5 mg by mouth daily.  11/25/19   [name], MD  tiZANidine (ZANAFLEX) 4 MG tablet Take 1 tablet (4 mg total) by mouth every 8 (eight) hours as needed for muscle spasms. 11/11/18   [name], MD    Allergies    Patient has no known allergies.  Review of Systems   Review of Systems  Constitutional: Negative for appetite change.  HENT: Negative for congestion.   Respiratory: Positive for shortness of breath.   Cardiovascular: Negative for chest pain and leg swelling.  Gastrointestinal: Negative for abdominal pain.  Genitourinary: Negative for decreased urine volume.  Skin: Positive for wound.  Neurological: Negative for weakness.  Psychiatric/Behavioral: Negative for confusion.    Physical Exam Updated Vital Signs BP 134/74 (BP Location: Right Arm)   Pulse 71   Temp 98.3 F (36.8 C) (Oral)   Resp 17   Ht 6\' 3"  (1.905 m)   Wt 108.9 kg   SpO2 98%   BMI 30.00 kg/m   Physical Exam Vitals and nursing note reviewed.  HENT:     Head: Normocephalic.  Cardiovascular:     Rate and Rhythm: Regular rhythm.  Pulmonary:     Comments: Decreased breath sounds on right compared to contralateral.  No respiratory distress. Chest:     Chest wall: No tenderness.     Comments: Skin changes from previous chest tube and possible reaction to tape on right lateral chest wall. Abdominal:     Tenderness: There is no abdominal tenderness.  Musculoskeletal:     Right lower leg: No tenderness.     Left lower leg: No tenderness.  Skin:    General: Skin  is warm.     Capillary Refill: Capillary refill takes less than 2 seconds.  Neurological:     Mental Status: He is alert.     ED Results / Procedures / Treatments   Labs (all labs ordered are listed, but only abnormal results are displayed) Labs Reviewed  CBC  BASIC METABOLIC PANEL    EKG None  Radiology DG Chest 2 View  Result Date: 04/07/2020 CLINICAL DATA:  Pneumothorax.  Recent chest tube placement EXAM:  CHEST - 2 VIEW COMPARISON:  March 30, 2020 FINDINGS: There is a moderate pneumothorax on the right without appreciable tension component. There is atelectatic change in the right base. There is an apparent nipple shadow at the right base. Lungs otherwise are clear. Heart size and pulmonary vascularity are normal. No adenopathy. There is degenerative change in the lower thoracic region. IMPRESSION: Moderate right-sided pneumothorax without tension component. Right base atelectasis. Lungs otherwise clear. Critical Value/emergent results were called by telephone at the time of interpretation on 04/07/2020 at 9:10 am to provider [facility] , who verbally acknowledged these results. Electronically Signed   By: Lowella Grip III M.D.   On: 04/07/2020 09:10    Procedures Procedures (including critical care time)  Medications Ordered in ED Medications - No data to display  ED Course  I have reviewed the triage vital signs and the nursing notes.  Pertinent labs & imaging results that were available during my care of the patient were reviewed by me and considered in my medical decision making (see chart for details).    MDM Rules/Calculators/A&P                          Patient with recurrent pneumothorax.  Seen by pulmonology.  They will admit.  Will get repeat chest tube. Final Clinical Impression(s) / ED Diagnoses Final diagnoses:  Recurrent pneumothorax    Rx / DC Orders ED Discharge Orders    None       Davonna Belling, MD 04/07/20 1155

## 2020-04-07 NOTE — Procedures (Signed)
Insertion of Chest Tube Procedure Note  Gregory Suarez  [id number]  1945/12/18  Date:04/07/20  Time:2:37 PM    Provider Performing: Clementeen Graham   Procedure: Chest Tube Insertion [phone])  Indication(s) Pneumothorax  Consent Risks of the procedure as well as the alternatives and risks of each were explained to the patient and/or caregiver.  Consent for the procedure was obtained and is signed in the bedside chart  Anesthesia Topical only with 1% lidocaine    Time Out Verified patient identification, verified procedure, site/side was marked, verified correct patient position, special equipment/implants available, medications/allergies/relevant history reviewed, required imaging and test results available.   Sterile Technique Maximal sterile technique including full sterile barrier drape, hand hygiene, sterile gown, sterile gloves, mask, hair covering, sterile ultrasound probe cover (if used).   Procedure Description Ultrasound used to identify appropriate pleural anatomy for placement and overlying skin marked. Area of placement cleaned and draped in sterile fashion.  A 14 French pigtail pleural catheter was placed into the right pleural space using Seldinger technique. Appropriate return of air was obtained.  The tube was connected to atrium and placed on -20 cm H2O wall suction.   Complications/Tolerance had pain after placement that resolved after sxn turned off.  Chest X-ray is ordered to verify placement.   EBL Minimal  Specimen(s) none

## 2020-04-07 NOTE — ED Notes (Signed)
Attempted to call report, nurse stated she still needs to get rid of a patient before she can take report on this patient.  Will call back in 15 minutes.

## 2020-04-07 NOTE — ED Notes (Signed)
ED Provider at bedside. 

## 2020-04-07 NOTE — Consult Note (Addendum)
NAME:  Gregory Suarez, MRN:  [id number], DOB:  03/09/1946, LOS: 0 ADMISSION DATE:  04/07/2020, CONSULTATION DATE:  7/8 REFERRING MD:  Alvino Chapel, CHIEF COMPLAINT:  Recurrent PTX   Brief History   74 year old male patient with history as described below presents 7/8 with recurrent right pneumothorax  History of present illness   This is a very pleasant 74 year old male patient with just discharged on 6/3 with a history as mentioned below after being admitted and treated for spontaneous right pneumothorax requiring chest tube decompression.  He was treated during the hospital stay in the usual fashion with chest tube placement, suction, subsequent waterseal, and removal of chest tube after post waterseal evaluation chest x-ray normal.  Since discharge she has remained quite dyspneic primarily with any exertion reporting shortness of breath even walking across the room.  He denies any fever chills, no pleuritic type chest pain or pain radiating up the shoulder blade.  His only real complaint is pain has been associated with the dressing which was initially placed on the chest tube site, with bruising, erythema, and some blistering, however he says this is better.  He has been seen by his primary physician, as directed by discharging team and a follow-up chest x-ray was obtained demonstrating a moderate sized right pneumothorax and because of this pulmonary was asked to evaluate  Past Medical History  Spontaneous right pneumothorax hypertension, coronary artery, COPD, restless leg, hyperlipidemia, obstructive sleep apnea  Significant Hospital Events   7/8 admitted  Consults:  Pulmonary consulted  Procedures:    Significant Diagnostic Tests:  CT chest 7/8>>>  Micro Data:    Antimicrobials:    Interim history/subjective:  No distress but does c/o exertional dyspnea  Objective   Blood pressure 134/74, pulse 71, temperature 98.3 F (36.8 C), temperature source Oral, resp. rate 17, height 6'  3" (1.905 m), weight 108.9 kg, SpO2 98 %.       No intake or output data in the 24 hours ending 04/07/20 1130 Filed Weights   04/07/20 1054  Weight: 108.9 kg    Examination: General: 74 year old white male resting in bed. No distress currently  HENT: NCAT no JVD MMM Lungs: clear/decreased Right no accessory use. Now on room air  Cardiovascular: RRR w/out MRG Abdomen: not tender + bowel sounds  Extremities: warm, dry, no accessory use  Neuro: awake and oriented. No focal def  GU: due to void   Resolved Hospital Problem list     Assessment & Plan:  Recurrent Spontaneous Right pneumothorax -does have sig bullous disease on prior CT 1 year ago. Chest tube was removed 6/30.  Plan Place Right CT. Will start w/20cmH2O sxn over night Am cxr, if no airleak 24 hrs water seal, given reoccurrence or possibly never completely resolved would also give trial of clamping the chest tube w/ post-removal CXR this time to ensure complete resolution  PRN analgesia  CT chest after CT chest tube placed. Follow up bullous disease.  If PTX slow to resolve OR comes again may will need surgical evaluation to consider pleurodesis  h/o tobacco abuse w/ stated h/o COPD (no PFTs in chart) Plan Scheduled duoneb for now Will need PFTs as out-pt  Will follow up with [place] as out pt   H/o OSA Plan Pulse ox Supplemental oxygen at HS Hold CPAP for now given PTX   H/o CAD and HTN Plan Cont home meds  H/o RLS Plan Cont home meds    Best practice:  Diet: NPO Pain/Anxiety/Delirium  protocol (if indicated): PRN VAP protocol (if indicated): NA DVT prophylaxis: [place] heparin GI prophylaxis: NA Glucose control: NA Mobility: as tol  Code Status: full code Family Communication: wife updated  Disposition: to the SDU   Labs   CBC: Recent Labs  Lab 04/07/20 1028  WBC 10.4  HGB 16.4  HCT 49.6  MCV 95.2  PLT 626    Basic Metabolic Panel: Recent Labs  Lab 04/07/20 1028  NA 140  K 4.6  CL 102    CO2 26  GLUCOSE 92  BUN 17  CREATININE 0.94  CALCIUM 9.4   GFR: Estimated Creatinine Clearance: 93.4 mL/min (by C-G formula based on SCr of 0.94 mg/dL). Recent Labs  Lab 04/07/20 1028  WBC 10.4    Liver Function Tests: No results for input(s): AST, ALT, ALKPHOS, BILITOT, PROT, ALBUMIN in the last 168 hours. No results for input(s): LIPASE, AMYLASE in the last 168 hours. No results for input(s): AMMONIA in the last 168 hours.  ABG No results found for: PHART, PCO2ART, PO2ART, HCO3, TCO2, ACIDBASEDEF, O2SAT   Coagulation Profile: No results for input(s): INR, PROTIME in the last 168 hours.  Cardiac Enzymes: No results for input(s): CKTOTAL, CKMB, CKMBINDEX, TROPONINI in the last 168 hours.  HbA1C: No results found for: HGBA1C  CBG: No results for input(s): GLUCAP in the last 168 hours.  Review of Systems:   Review of Systems  Constitutional: Negative for chills, fever and malaise/fatigue.  HENT: Negative.   Eyes: Negative.   Respiratory: Positive for shortness of breath. Negative for cough, sputum production and wheezing.   Cardiovascular: Negative.   Gastrointestinal: Negative.   Genitourinary: Negative.   Musculoskeletal: Negative.   Skin: Positive for rash.       There is sig redness, irritation and blistering of the right chest wall   Neurological: Negative.   Endo/Heme/Allergies: Negative.   Psychiatric/Behavioral: Negative.      Past Medical History  He,  has a past medical history of Basal cell carcinoma (06/28/2015), Basal cell carcinoma (08/05/2017), CAD (coronary artery disease), Elevated PSA, Erectile dysfunction, GERD (gastroesophageal reflux disease), Hyperlipidemia, Hypertension, Mitral valve regurgitation, OSA treated with BiPAP, RLS (restless legs syndrome), Squamous cell carcinoma of skin (02/12/2017), and Tubular adenoma of colon.   Surgical History    Past Surgical History:  Procedure Laterality Date   ANKLE SURGERY     CHOLECYSTECTOMY      CHOLECYSTECTOMY     LEFT HEART CATH AND CORONARY ANGIOGRAPHY N/A 02/13/2019   Procedure: LEFT HEART CATH AND CORONARY ANGIOGRAPHY;  Surgeon: Troy Sine, MD;  Location: [facility];  Service: Cardiovascular;  Laterality: N/A;     Social History   reports that he quit smoking about 31 years ago. His smoking use included cigarettes. He has a 7.50 pack-year smoking history. He has never used smokeless tobacco. He reports that he does not drink alcohol and does not use drugs.   Family History   His family history includes Breast cancer in an other family member; Coronary artery disease in an other family member; Diabetes in an other family member.   Allergies No Known Allergies   Home Medications  Prior to Admission medications   Medication Sig Start Date End Date Taking? Authorizing Provider  amLODipine (NORVASC) 2.5 MG tablet Take 1 tablet (2.5 mg total) by mouth daily. 01/19/20   Almyra Deforest, PA  aspirin EC 81 MG tablet Take 1 tablet (81 mg total) by mouth daily. 11/12/18   Troy Sine, MD  atorvastatin (LIPITOR) 80 MG tablet TAKE 1 TABLET BY MOUTH EVERY DAY Patient taking differently: Take 80 mg by mouth daily.  01/22/20   Bhagat, Crista Luria, PA  chlorpheniramine (ALLERGY) 4 MG tablet Take 8 mg by mouth at bedtime.     [provider]  finasteride (PROSCAR) 5 MG tablet Take 1 tablet (5 mg total) by mouth daily. 02/18/20   [name], MD  HYDROcodone-homatropine (HYCODAN) 5-1.5 MG/5ML syrup Take 5 mLs by mouth every 8 (eight) hours as needed for cough. 11/11/18   [name], MD  Melatonin 5 MG TABS Take 5 mg by mouth at bedtime.    [provider]  metoprolol tartrate (LOPRESSOR) 25 MG tablet TAKE 1 TABLET BY MOUTH TWICE A DAY Patient taking differently: Take 25 mg by mouth.  11/30/19   [name], MD  Multiple Vitamin (MULTIVITAMIN WITH MINERALS) TABS tablet Take 1 tablet by mouth in the morning and at bedtime.     [provider]   pantoprazole (PROTONIX) 40 MG tablet Take 1 tablet (40 mg total) by mouth 2 (two) times daily. 01/25/20   [name], MD  pramipexole (MIRAPEX) 0.25 MG tablet TAKE 2 TABLETS BY MOUTH AT BEDTIME Patient taking differently: Take 0.5 mg by mouth daily.  01/18/20   [name], MD  tadalafil (CIALIS) 5 MG tablet TAKE ONE TABLET BY MOUTH DAILY Patient taking differently: Take 5 mg by mouth daily.  11/25/19   [name], MD  tiZANidine (ZANAFLEX) 4 MG tablet Take 1 tablet (4 mg total) by mouth every 8 (eight) hours as needed for muscle spasms. 11/11/18   [name], MD     Critical care time: NA   Erick Colace ACNP-BC [place] Pager # [phone] OR # [phone] if no answer

## 2020-04-07 NOTE — ED Notes (Signed)
Patient arrives via POV by wife c/o SOB.  Patient was at [facility] about 2 weeks ago for a right pneumothorax with chest tube placement.  Patient was discharged but is again feeling SOB.  No cough, and on room air.

## 2020-04-08 ENCOUNTER — Inpatient Hospital Stay (HOSPITAL_COMMUNITY): Payer: Medicare Other

## 2020-04-08 ENCOUNTER — Ambulatory Visit: Payer: Medicare Other | Admitting: Family Medicine

## 2020-04-08 DIAGNOSIS — J431 Panlobular emphysema: Secondary | ICD-10-CM

## 2020-04-08 DIAGNOSIS — J449 Chronic obstructive pulmonary disease, unspecified: Secondary | ICD-10-CM

## 2020-04-08 LAB — BASIC METABOLIC PANEL
Anion gap: 7 (ref 5–15)
BUN: 17 mg/dL (ref 8–23)
CO2: 24 mmol/L (ref 22–32)
Calcium: 8.4 mg/dL — ABNORMAL LOW (ref 8.9–10.3)
Chloride: 104 mmol/L (ref 98–111)
Creatinine, Ser: 1.02 mg/dL (ref 0.61–1.24)
GFR calc Af Amer: >60 mL/min (ref >60)
GFR calc non Af Amer: >60 mL/min (ref >60)
Glucose, Bld: 125 mg/dL — ABNORMAL HIGH (ref 70–99)
Potassium: 4.5 mmol/L (ref 3.5–5.1)
Sodium: 135 mmol/L (ref 135–145)

## 2020-04-08 LAB — CBC
HCT: 43.7 % (ref 39.0–52.0)
Hemoglobin: 14.1 g/dL (ref 13.0–17.0)
MCH: 30.6 pg (ref 26.0–34.0)
MCHC: 32.3 g/dL (ref 30.0–36.0)
MCV: 94.8 fL (ref 80.0–100.0)
Platelets: 184 10*3/uL (ref 150–400)
RBC: 4.61 MIL/uL (ref 4.22–5.81)
RDW: 13.5 % (ref 11.5–15.5)
WBC: 9.7 10*3/uL (ref 4.0–10.5)
nRBC: 0 % (ref 0.0–0.2)

## 2020-04-08 LAB — MAGNESIUM: Magnesium: 2.1 mg/dL (ref 1.7–2.4)

## 2020-04-08 LAB — PHOSPHORUS: Phosphorus: 4.1 mg/dL (ref 2.5–4.6)

## 2020-04-08 MED ORDER — TIZANIDINE HCL 4 MG PO TABS
4.0000 mg | ORAL_TABLET | Freq: Three times a day (TID) | ORAL | Status: DC
  Administered 2020-04-08 – 2020-04-18 (×32): 4 mg via ORAL
  Filled 2020-04-08 (×32): qty 1

## 2020-04-08 MED ORDER — KETOROLAC TROMETHAMINE 15 MG/ML IJ SOLN
15.0000 mg | Freq: Four times a day (QID) | INTRAMUSCULAR | Status: AC
  Administered 2020-04-08 – 2020-04-10 (×8): 15 mg via INTRAVENOUS
  Filled 2020-04-08 (×8): qty 1

## 2020-04-08 MED ORDER — ONDANSETRON HCL 4 MG/2ML IJ SOLN
INTRAMUSCULAR | Status: AC
  Filled 2020-04-08: qty 2

## 2020-04-08 MED ORDER — ONDANSETRON HCL 4 MG/2ML IJ SOLN
4.0000 mg | Freq: Four times a day (QID) | INTRAMUSCULAR | Status: DC | PRN
  Administered 2020-04-08: 4 mg via INTRAVENOUS

## 2020-04-08 MED ORDER — ORAL CARE MOUTH RINSE
15.0000 mL | Freq: Two times a day (BID) | OROMUCOSAL | Status: DC
  Administered 2020-04-08 – 2020-04-18 (×14): 15 mL via OROMUCOSAL

## 2020-04-08 NOTE — Progress Notes (Signed)
[facility] Progress Note Patient Name: Deaunte Dente DOB: Feb 16, 1946 MRN: [id number]   Date of Service  04/08/2020  HPI/Events of Note  Patient was stranded here at [facility] late tonight because of a misunderstanding of the visitation policy, she does not drive.  eICU Interventions  Nursing communication order entered allowing patient's spouse to spend the night.        Kerry Kass Felesha Moncrieffe 04/08/2020, 12:09 AM

## 2020-04-08 NOTE — Plan of Care (Signed)
This RN will continue to monitor patient's progression of care plan.  

## 2020-04-08 NOTE — TOC Initial Note (Signed)
Transition of Care [facility]) - Initial/Assessment Note    Patient Details  Name: Gregory Suarez MRN: [id number] Date of Birth: May 01, 1946  Transition of Care [facility]) CM/SW Contact:    Leeroy Cha, RN Phone Number: 04/08/2020, 9:56 AM  Clinical Narrative:                 Right Pneumothorax, requiring insertion of chest tube. Plan follow for dc needs and plan of care progression.   Expected Discharge Plan: Home/Self Care Barriers to Discharge: Continued Medical Work up   Patient Goals and CMS Choice Patient states their goals for this hospitalization and ongoing recovery are:: to go home CMS Medicare.gov Compare Post Acute Care list provided to:: Patient    Expected Discharge Plan and Services Expected Discharge Plan: Home/Self Care   Discharge Planning Services: CM Consult   Living arrangements for the past 2 months: Single Family Home Expected Discharge Date:  (unknown)                                    Prior Living Arrangements/Services Living arrangements for the past 2 months: Single Family Home Lives with:: Spouse Patient language and need for interpreter reviewed:: Yes Do you feel safe going back to the place where you live?: Yes      Need for Family Participation in Patient Care: Yes (Comment) Care giver support system in place?: Yes (comment)   Criminal Activity/Legal Involvement Pertinent to Current Situation/Hospitalization: No - Comment as needed  Activities of Daily Living Home Assistive Devices/Equipment: Eyeglasses ADL Screening (condition at time of admission) Patient's cognitive ability adequate to safely complete daily activities?: Yes Is the patient deaf or have difficulty hearing?: No Does the patient have difficulty seeing, even when wearing glasses/contacts?: No Does the patient have difficulty concentrating, remembering, or making decisions?: No Patient able to express need for assistance with ADLs?: Yes Does the patient have difficulty  dressing or bathing?: No Independently performs ADLs?: Yes (appropriate for developmental age) Does the patient have difficulty walking or climbing stairs?: No Weakness of Legs: None Weakness of Arms/Hands: None  Permission Sought/Granted                  Emotional Assessment Appearance:: Appears stated age     Orientation: : Oriented to Self, Oriented to Place, Oriented to  Time, Oriented to Situation Alcohol / Substance Use: Not Applicable Psych Involvement: No (comment)  Admission diagnosis:  Admission for chest tube placement [Z46.82] Pneumothorax on right [J93.9] Recurrent pneumothorax [J93.9] Patient Active Problem List   Diagnosis Date Noted  . Pneumothorax on right 04/07/2020  . Spontaneous pneumothorax 03/28/2020  . COPD (chronic obstructive pulmonary disease) ([place]) 03/28/2020  . Tubular adenoma of colon   . RLS (restless legs syndrome)   . Abnormal cardiac CT angiography 02/27/2012  . Chronic cough 02/27/2012  . Hypertension 02/26/2012  . Hyperlipidemia 02/26/2012  . GERD (gastroesophageal reflux disease)   . CAD (coronary artery disease)   . Erectile dysfunction   . Mitral valve regurgitation    PCP:  [name], MD Pharmacy:   [id number] - [place], [place] [address] [place] [address] Phone: [phone] Fax: [phone]     Social Determinants of Health (SDOH) Interventions    Readmission Risk Interventions No flowsheet data found.

## 2020-04-08 NOTE — H&P (Signed)
The consult note is the H&P  Erick Colace ACNP-BC [place] Pager # [phone] OR # [phone] if no answer

## 2020-04-08 NOTE — Progress Notes (Signed)
NAME:  Gregory Suarez, MRN:  [id number], DOB:  [birth year], LOS: 1 ADMISSION DATE:  04/07/2020, CONSULTATION DATE:  7/8 REFERRING MD:  Alvino Chapel, CHIEF COMPLAINT:  Recurrent PTX   Brief History   74 year old male patient with history as described below presents 7/8 with recurrent right pneumothorax   Past Medical History  Spontaneous right pneumothorax hypertension, coronary artery, COPD, restless leg, hyperlipidemia, obstructive sleep apnea  Significant Hospital Events   7/8 admitted, right chest tube placed in the emergency room had significant discomfort after being placed on suction this was aborted, left on waterseal 7/9 still having discomfort starting Toradol and scheduled muscle relaxant  Consults:  Pulmonary consulted  Procedures:    Significant Diagnostic Tests:  CT chest 7/8>>>  Micro Data:    Antimicrobials:    Interim history/subjective:  Continues to complain of chest discomfort  Objective   Blood pressure (Abnormal) 167/57, pulse 61, temperature 97.6 F (36.4 C), temperature source Oral, resp. rate 20, height 6\' 3"  (1.905 m), weight 107.5 kg, SpO2 96 %.        Intake/Output Summary (Last 24 hours) at 04/08/2020 1134 Last data filed at 04/08/2020 1000 Gross per 24 hour  Intake 1322.06 ml  Output 1125 ml  Net 197.06 ml   Filed Weights   04/07/20 1054 04/07/20 1832 04/08/20 0500  Weight: 108.9 kg 107.5 kg 107.5 kg    Examination: General 74 year old white male resting in bed no acute distress but still guarding with significant discomfort depending on position HEENT normocephalic atraumatic no jugular venous distention appreciated Pulmonary clear to auscultation diminished the right chest tube is without air leak today dressings clean dry and intact Cardiac regular rate and rhythm The abdomen is soft nontender no organomegaly Extremities warm dry brisk cap refill Neuro intact  Resolved Hospital Problem list     Assessment & Plan:  Recurrent versus  persistent spontaneous Right pneumothorax -does have sig bullous disease on prior CT 1 year ago. Chest tube was removed 6/30.  -Portable chest x-ray with lung essentially fully expanded with the exception of very small apical pneumothorax -No air leak noted, still has significant discomfort with deep breath and some positional discomfort we suspect this is likely consequence of prolonged being collapse for some time Plan Keeping chest tube to waterseal  Getting CT chest today  Anticipate clamping trial for 24 hours starting 7/10  Adding scheduled Zanaflex (he takes it at home) and scheduled Toradol  As needed narcotics for breakthrough  If recurs would need thoracic evaluation  h/o tobacco abuse w/ stated h/o COPD (no PFTs in chart) Plan Scheduled DuoNeb  Needs PFTs as outpatient   H/o OSA Plan Continue supplemental oxygen at at bedtime and pulse oximetry  Holding CPAP given pneumothorax    H/o CAD and HTN Plan Continue home meds  H/o RLS Plan Continue home meds   Best practice:  Diet: advance as tolerated  Pain/Anxiety/Delirium protocol (if indicated): PRN VAP protocol (if indicated): NA DVT prophylaxis: [place] heparin GI prophylaxis: NA Glucose control: NA Mobility: as tol  Code Status: full code Family Communication: wife updated  Disposition: to the SDU   Labs   CBC: Recent Labs  Lab 04/07/20 1028 04/08/20 0228  WBC 10.4 9.7  HGB 16.4 14.1  HCT 49.6 43.7  MCV 95.2 94.8  PLT 230 756    Basic Metabolic Panel: Recent Labs  Lab 04/07/20 1028 04/08/20 0228  NA 140 135  K 4.6 4.5  CL 102 104  CO2 26 24  GLUCOSE  92 125*  BUN 17 17  CREATININE 0.94 1.02  CALCIUM 9.4 8.4*  MG  --  2.1  PHOS  --  4.1   GFR: Estimated Creatinine Clearance: 85.5 mL/min (by C-G formula based on SCr of 1.02 mg/dL). Recent Labs  Lab 04/07/20 1028 04/08/20 0228  WBC 10.4 9.7    Liver Function Tests: No results for input(s): AST, ALT, ALKPHOS, BILITOT, PROT, ALBUMIN  in the last 168 hours. No results for input(s): LIPASE, AMYLASE in the last 168 hours. No results for input(s): AMMONIA in the last 168 hours.  ABG No results found for: PHART, PCO2ART, PO2ART, HCO3, TCO2, ACIDBASEDEF, O2SAT   Coagulation Profile: No results for input(s): INR, PROTIME in the last 168 hours.  Cardiac Enzymes: No results for input(s): CKTOTAL, CKMB, CKMBINDEX, TROPONINI in the last 168 hours.  HbA1C: No results found for: HGBA1C  CBG: No results for input(s): GLUCAP in the last 168 hours.  Erick Colace ACNP-BC [place] Pager # [phone] OR # [phone] if no answer

## 2020-04-09 ENCOUNTER — Inpatient Hospital Stay (HOSPITAL_COMMUNITY): Payer: Medicare Other

## 2020-04-09 LAB — BASIC METABOLIC PANEL
Anion gap: 10 (ref 5–15)
BUN: 15 mg/dL (ref 8–23)
CO2: 25 mmol/L (ref 22–32)
Calcium: 8.6 mg/dL — ABNORMAL LOW (ref 8.9–10.3)
Chloride: 104 mmol/L (ref 98–111)
Creatinine, Ser: 1.06 mg/dL (ref 0.61–1.24)
GFR calc Af Amer: >60 mL/min (ref >60)
GFR calc non Af Amer: >60 mL/min (ref >60)
Glucose, Bld: 93 mg/dL (ref 70–99)
Potassium: 4.2 mmol/L (ref 3.5–5.1)
Sodium: 139 mmol/L (ref 135–145)

## 2020-04-09 MED ORDER — IPRATROPIUM-ALBUTEROL 0.5-2.5 (3) MG/3ML IN SOLN: 3.0000 mL | RESPIRATORY_TRACT | Status: DC | PRN

## 2020-04-09 NOTE — Progress Notes (Signed)
NAME:  Gregory Suarez, MRN:  [id number], DOB:  Aug 01, 1946, LOS: 2 ADMISSION DATE:  04/07/2020, CONSULTATION DATE:  7/8 REFERRING MD:  Alvino Chapel, CHIEF COMPLAINT:  Recurrent PTX   Brief History   74 year old male patient with history as described below presents 7/8 with recurrent right pneumothorax No overnight events  Past Medical History  Spontaneous right pneumothorax hypertension, coronary artery, COPD, restless leg, hyperlipidemia, obstructive sleep apnea  Significant Hospital Events   7/8 admitted, right chest tube placed in the emergency room had significant discomfort after being placed on suction this was aborted, left on waterseal 7/9 still having discomfort starting Toradol and scheduled muscle relaxant  Consults:  Pulmonary consulted  Procedures:    Significant Diagnostic Tests:  CT chest 7/8>>> Chest x-ray 7/10-pneumothorax noted on early morning x-ray, repeat following application of -10 suction reveals decreased pneumothorax  CT chest reviewed by myself 7/9 IMPRESSION: 1. Right-sided chest tube in place without evidence for right-sided pneumothorax. 2. Bibasilar airspace disease concerning for pneumonia, aspiration, or atelectasis. 3. Stable mediastinal and hilar adenopathy, presumably reactive. 4. Probable splenomegaly involving the partially visualized spleen. Micro Data:  none  Antimicrobials:    Interim history/subjective:  Continues to complain of chest discomfort but better  Objective   Blood pressure (!) 108/54, pulse 63, temperature 97.9 F (36.6 C), temperature source Oral, resp. rate (!) 22, height 6\' 3"  (1.905 m), weight 109.6 kg, SpO2 92 %.        Intake/Output Summary (Last 24 hours) at 04/09/2020 1536 Last data filed at 04/09/2020 1249 Gross per 24 hour  Intake 1564.72 ml  Output 2790 ml  Net -1225.28 ml   Filed Weights   04/07/20 1832 04/08/20 0500 04/09/20 0500  Weight: 107.5 kg 107.5 kg 109.6 kg    Examination: General  74 year old white male resting in bed no acute distress, pain with deep breathing HEENT normocephalic atraumatic no jugular venous distention appreciated Pulmonary: Clear to auscultation, no airleak  Cardiac regular rate and rhythm The abdomen is soft nontender no organomegaly Extremities warm dry brisk cap refill Neuro intact  Resolved Hospital Problem list     Assessment & Plan:  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 Chest tube was removed 6/30.  -There is still remnant of apical pneumothorax even after application of suction at -10 -No air leak noted, still has significant discomfort with deep breath and some positional discomfort we suspect this is likely consequence of prolonged being collapse for some time Plan Keeping chest tube to -10 suction Getting CT chest today  Anticipate clamping trial for 24 hours starting 7/10  Continue scheduled Zanaflex (he takes it at home) and scheduled Toradol  As needed narcotics for breakthrough  If recurs would need thoracic evaluation  h/o tobacco abuse w/ stated h/o COPD (no PFTs in chart) Plan Scheduled DuoNeb -changed to as needed dosing Needs PFTs as outpatient   H/o OSA Plan Continue supplemental oxygen at at bedtime and pulse oximetry  Holding CPAP given pneumothorax    H/o CAD and HTN Plan Continue home meds  H/o RLS Plan Continue home meds Chest x-ray post suction placement reviewed CT scan reviewed and result discussed with patient Best practice:  Diet: advance as tolerated  Pain/Anxiety/Delirium protocol (if indicated): PRN VAP protocol (if indicated): NA DVT prophylaxis: [place] heparin GI prophylaxis: NA Glucose control: NA Mobility: as tol  Code Status: full code Family Communication: wife updated  Disposition: to the [employer]  CBC: Recent Labs  Lab 04/07/20 1028 04/08/20 0228  WBC 10.4 9.7  HGB 16.4 14.1  HCT 49.6 43.7  MCV  95.2 94.8  PLT 230 150    Basic Metabolic Panel: Recent Labs  Lab 04/07/20 1028 04/08/20 0228 04/09/20 0240  NA 140 135 139  K 4.6 4.5 4.2  CL 102 104 104  CO2 26 24 25   GLUCOSE 92 125* 93  BUN 17 17 15   CREATININE 0.94 1.02 1.06  CALCIUM 9.4 8.4* 8.6*  MG  --  2.1  --   PHOS  --  4.1  --    GFR: Estimated Creatinine Clearance: 83 mL/min (by C-G formula based on SCr of 1.06 mg/dL). Recent Labs  Lab 04/07/20 1028 04/08/20 0228  WBC 10.4 9.7    Liver Function Tests: No results for input(s): AST, ALT, ALKPHOS, BILITOT, PROT, ALBUMIN in the last 168 hours. No results for input(s): LIPASE, AMYLASE in the last 168 hours. No results for input(s): AMMONIA in the last 168 hours.  ABG No results found for: PHART, PCO2ART, PO2ART, HCO3, TCO2, ACIDBASEDEF, O2SAT   Coagulation Profile: No results for input(s): INR, PROTIME in the last 168 hours.  Cardiac Enzymes: No results for input(s): CKTOTAL, CKMB, CKMBINDEX, TROPONINI in the last 168 hours.  HbA1C: No results found for: HGBA1C  CBG: No results for input(s): GLUCAP in the last 168 hours. Sherrilyn Rist, MD [place] [facility] Pager: [phone]

## 2020-04-10 ENCOUNTER — Inpatient Hospital Stay (HOSPITAL_COMMUNITY): Payer: Medicare Other

## 2020-04-10 NOTE — Progress Notes (Signed)
NAME:  Gregory Suarez, MRN:  [id number], DOB:  1946/03/03, LOS: 3 ADMISSION DATE:  04/07/2020, CONSULTATION DATE:  7/8 REFERRING MD:  Alvino Chapel, CHIEF COMPLAINT:  Recurrent PTX   Brief History   74 year old male patient with history as described below presents 7/8 with recurrent right pneumothorax  No overnight events Past Medical History  Spontaneous right pneumothorax hypertension, coronary artery, COPD, restless leg, hyperlipidemia, obstructive sleep apnea Significant Hospital Events   7/8 admitted, right chest tube placed in the emergency room had significant discomfort after being placed on suction this was aborted, left on waterseal 7/9 still having discomfort starting Toradol and scheduled muscle relaxant Consults:  Pulmonary consulted  Procedures:    Significant Diagnostic Tests:  CT chest 7/8>>> Chest x-ray 7/10-pneumothorax noted on early morning x-ray, repeat following application of -10 suction reveals decreased pneumothorax  CT chest reviewed by myself 7/9 IMPRESSION: 1. Right-sided chest tube in place without evidence for right-sided pneumothorax. 2. Bibasilar airspace disease concerning for pneumonia, aspiration, or atelectasis. 3. Stable mediastinal and hilar adenopathy, presumably reactive. 4. Probable splenomegaly involving the partially visualized spleen. Micro Data:  none  Antimicrobials:    Interim history/subjective:  Continues to complain of chest discomfort but better  Objective   Blood pressure (!) 126/55, pulse 65, temperature 98.5 F (36.9 C), temperature source Oral, resp. rate 19, height 6\' 3"  (1.905 m), weight 110.8 kg, SpO2 90 %.        Intake/Output Summary (Last 24 hours) at 04/10/2020 1557 Last data filed at 04/10/2020 1515 Gross per 24 hour  Intake 1370.19 ml  Output 2538 ml  Net -1167.81 ml   Filed Weights   04/08/20 0500 04/09/20 0500 04/10/20 0500  Weight: 107.5 kg 109.6 kg 110.8 kg    Examination: General 74 year old white  male resting in bed no acute distress HEENT normocephalic atraumatic no jugular venous distention appreciated Pulmonary: Clear to auscultation, no air leak in chest tube Cardiac S1-S2 appreciated The abdomen is soft nontender no organomegaly  Resolved Hospital Problem list     Assessment & Plan:  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 Chest tube was removed 6/30.  -There is still remnant of apical pneumothorax even after application of suction at -10 -No air leak noted, still has significant discomfort with deep breath and some positional discomfort we suspect this is likely consequence of prolonged being collapse for some time Plan Keeping chest tube to -10 suction Chest x-ray reviewed showing stable pneumo Anticipate clamping trial for 24 hours starting 7/12  Continue scheduled Zanaflex (he takes it at home) and scheduled Toradol  As needed narcotics for breakthrough  If recurs would need thoracic evaluation  h/o tobacco abuse w/ stated h/o COPD (no PFTs in chart) Plan Scheduled DuoNeb -changed to as needed dosing Needs PFTs as outpatient   H/o OSA Plan Continue supplemental oxygen at at bedtime and pulse oximetry  Holding CPAP given pneumothorax    H/o CAD and HTN Plan Continue home meds  H/o RLS Plan Continue home meds Chest x-ray post suction placement reviewed CT scan reviewed and result discussed with patient Best practice:  Diet: advance as tolerated  Pain/Anxiety/Delirium protocol (if indicated): PRN VAP protocol (if indicated): NA DVT prophylaxis: [place] heparin GI prophylaxis: NA Glucose control: NA Mobility: as tol  Code Status: full code Family Communication: wife updated  Disposition: to the SDU   Labs   CBC: Recent Labs  Lab 04/07/20 1028 04/08/20 0228  WBC  10.4 9.7  HGB 16.4 14.1  HCT 49.6 43.7  MCV 95.2 94.8  PLT 230 063    Basic Metabolic Panel: Recent Labs   Lab 04/07/20 1028 04/08/20 0228 04/09/20 0240  NA 140 135 139  K 4.6 4.5 4.2  CL 102 104 104  CO2 26 24 25   GLUCOSE 92 125* 93  BUN 17 17 15   CREATININE 0.94 1.02 1.06  CALCIUM 9.4 8.4* 8.6*  MG  --  2.1  --   PHOS  --  4.1  --    GFR: Estimated Creatinine Clearance: 83.4 mL/min (by C-G formula based on SCr of 1.06 mg/dL). Recent Labs  Lab 04/07/20 1028 04/08/20 0228  WBC 10.4 9.7   Sherrilyn Rist, MD [place] [facility] Pager: [phone]

## 2020-04-11 ENCOUNTER — Inpatient Hospital Stay (HOSPITAL_COMMUNITY): Payer: Medicare Other

## 2020-04-11 NOTE — Progress Notes (Signed)
CXR images reviewed, unchanged by my assessment from prior CXR.  Radiology indicates no acute changes.     Noe Gens, MSN, NP-C [facility] 04/11/2020, 3:48 PM   Please see [email or web address] for pager details.

## 2020-04-11 NOTE — Progress Notes (Signed)
NAME:  Spence Soberano, MRN:  [id number], DOB:  09-13-1946, LOS: 4 ADMISSION DATE:  04/07/2020, CONSULTATION DATE:  7/8 REFERRING MD:  Alvino Chapel, CHIEF COMPLAINT:  Recurrent PTX   Brief History   74 year old male patient with history as described below presents 7/8 with recurrent right pneumothorax  No overnight events Past Medical History  Spontaneous right pneumothorax hypertension, coronary artery, COPD, restless leg, hyperlipidemia, obstructive sleep apnea Significant Hospital Events   7/8 admitted, right chest tube placed in the emergency room had significant discomfort after being placed on suction this was aborted, left on waterseal 7/9 still having discomfort starting Toradol and scheduled muscle relaxant 9/12 Consults:  Pulmonary consulted  Procedures:    Significant Diagnostic Tests:  CT chest 7/8>>> Chest x-ray 7/10-pneumothorax noted on early morning x-ray, repeat following application of -10 suction reveals decreased pneumothorax  CT chest reviewed by myself 7/9 IMPRESSION: 1. Right-sided chest tube in place without evidence for right-sided pneumothorax. 2. Bibasilar airspace disease concerning for pneumonia, aspiration, or atelectasis. 3. Stable mediastinal and hilar adenopathy, presumably reactive. 4. Probable splenomegaly involving the partially visualized spleen. Micro Data:  none  Antimicrobials:  none  Interim history/subjective:  Continues to complain of chest discomfort but better Objective   Blood pressure (!) 156/79, pulse 88, temperature 98.5 F (36.9 C), temperature source Oral, resp. rate 19, height 6\' 3"  (1.905 m), weight 107.1 kg, SpO2 91 %.        Intake/Output Summary (Last 24 hours) at 04/11/2020 1058 Last data filed at 04/11/2020 1000 Gross per 24 hour  Intake 1364.16 ml  Output 2851 ml  Net -1486.84 ml   Filed Weights   04/09/20 0500 04/10/20 0500 04/11/20 0557  Weight: 109.6 kg 110.8 kg 107.1 kg    Examination: General 74 year old  white male resting in bed no acute distress HEENT normocephalic atraumatic no jugular venous distention appreciated Pulmonary: Clear to auscultation, no air leak in chest tube Cardiac S1-S2 appreciated The abdomen is soft nontender no organomegaly Pneumothorax on CXR this AM is a lot better on repeat CXR Resolved Hospital Problem list     Assessment & Plan:  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 Chest tube was removed 6/30.  -There is still remnant of apical pneumothorax even after application of suction at -10 -No air leak noted, still has significant discomfort with deep breath and some positional discomfort we suspect this is likely consequence of prolonged lung collapse for some time Plan Keeping chest tube to -10 suction Chest x-ray reviewed showing resolution of pneumothorax, no air leak Anticipate clamping trial for 24 hours after being stable on water seal Continue scheduled Zanaflex (he takes it at home) and scheduled Toradol  As needed narcotics for breakthrough  If recurs would need thoracic evaluation  h/o tobacco abuse w/ stated h/o COPD (no PFTs in chart) Plan Scheduled DuoNeb -changed to as needed dosing Needs PFTs as outpatient   H/o OSA Plan Continue supplemental oxygen at at bedtime and pulse oximetry  Holding CPAP given pneumothorax   H/o CAD and HTN Plan Continue home meds  H/o RLS Plan Continue home meds Chest x-ray post suction placement reviewed CT scan reviewed and result discussed with patient  Continue lines of care Chest tube remains to suction Best practice:  Diet: advance as tolerated  Pain/Anxiety/Delirium protocol (if indicated): PRN VAP protocol (if indicated): NA DVT prophylaxis: [place] heparin GI prophylaxis: NA Glucose control: NA Mobility: as tol  Code Status:  full code Family Communication: wife updated  Disposition: to the SDU   Labs   CBC: Recent  Labs  Lab 04/07/20 1028 04/08/20 0228  WBC 10.4 9.7  HGB 16.4 14.1  HCT 49.6 43.7  MCV 95.2 94.8  PLT 230 161    Basic Metabolic Panel: Recent Labs  Lab 04/07/20 1028 04/08/20 0228 04/09/20 0240  NA 140 135 139  K 4.6 4.5 4.2  CL 102 104 104  CO2 26 24 25   GLUCOSE 92 125* 93  BUN 17 17 15   CREATININE 0.94 1.02 1.06  CALCIUM 9.4 8.4* 8.6*  MG  --  2.1  --   PHOS  --  4.1  --    GFR: Estimated Creatinine Clearance: 82.1 mL/min (by C-G formula based on SCr of 1.06 mg/dL). Recent Labs  Lab 04/07/20 1028 04/08/20 0228  WBC 10.4 9.7   Sherrilyn Rist, MD [place] [facility] Pager: [phone]

## 2020-04-11 NOTE — Progress Notes (Signed)
Called by RN regarding patient c/o of low back pain, pain at chest tube site.  Chest tube remains on 10 cm suction.  Pt reports shooting pain at site.     Plan: -PRN dose of norco as ordered -assess repeat CXR to ensure no changes in pleural space   Noe Gens, MSN, NP-C [facility] 04/11/2020, 3:37 PM   Please see [email or web address] for pager details.

## 2020-04-12 ENCOUNTER — Inpatient Hospital Stay (HOSPITAL_COMMUNITY): Payer: Medicare Other

## 2020-04-12 MED ORDER — TADALAFIL 5 MG PO TABS
5.0000 mg | ORAL_TABLET | Freq: Every day | ORAL | Status: DC
  Administered 2020-04-12 – 2020-04-18 (×7): 5 mg via ORAL
  Filled 2020-04-12 (×7): qty 1

## 2020-04-12 NOTE — Progress Notes (Signed)
NAME:  Gregory Suarez, MRN:  [id number], DOB:  09/14/1946, LOS: 5 ADMISSION DATE:  04/07/2020, CONSULTATION DATE:  7/8 REFERRING MD:  Alvino Chapel, CHIEF COMPLAINT:  Recurrent PTX   Brief History   74 year old male patient with history as described below presents 7/8 with recurrent right pneumothorax  No overnight events slight okay the first line. Past Medical History  Spontaneous right pneumothorax hypertension, coronary artery, COPD, restless leg, hyperlipidemia, obstructive sleep apnea Significant Hospital Events   7/8 admitted, right chest tube placed in the emergency room had significant discomfort after being placed on suction this was aborted, left on waterseal 7/9 still having discomfort starting Toradol and scheduled muscle relaxant 9/12 no pneumothorax on CXR. Consults:  Pulmonary consulted  Procedures:  Chest tube placement 7/8  Significant Diagnostic Tests:  CT chest 7/8>>> Chest x-ray 7/10-pneumothorax noted on early morning x-ray, repeat following application of -10 suction reveals decreased pneumothorax  CT chest reviewed by myself 7/9 IMPRESSION: 1. Right-sided chest tube in place without evidence for right-sided pneumothorax. 2. Bibasilar airspace disease concerning for pneumonia, aspiration, or atelectasis. 3. Stable mediastinal and hilar adenopathy, presumably reactive. 4. Probable splenomegaly involving the partially visualized spleen. Micro Data:  none  Antimicrobials:  none  Interim history/subjective:  Continues to complain of chest discomfort but better  Objective   Blood pressure (!) 156/101, pulse 72, temperature 97.6 F (36.4 C), temperature source Oral, resp. rate 20, height 6\' 3"  (1.905 m), weight 107 kg, SpO2 92 %.        Intake/Output Summary (Last 24 hours) at 04/12/2020 1057 Last data filed at 04/12/2020 9678 Gross per 24 hour  Intake 1019.1 ml  Output 1575 ml  Net -555.9 ml   Filed Weights   04/10/20 0500 04/11/20 0557 04/12/20 0620    Weight: 110.8 kg 107.1 kg 107 kg    Examination: General: 74 year old white male resting in bed no acute distress HEENT: normocephalic atraumatic no jugular venous distention appreciated Pulmonary: Clear to auscultation, no air leak in chest tube Cardiac S1-S2 appreciated The abdomen is soft nontender no organomegaly Pneumothorax resolved on chest x-ray  Resolved Hospital Problem list     Assessment & Plan:  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  Current CT still shows emphysematous changes. -There is still remnant of apical pneumothorax even after application of suction at -10, improved. -No air leak noted, still has significant discomfort with deep breath and some positional discomfort we suspect this is likely consequence of prolonged lung collapse for some time Place chest tube to waterseal  Plan Chest tube to waterseal, anticipate clamping in a.m. Chest x-ray reviewed showing resolution of pneumothorax, no air leak Anticipate clamping trial for 24 hours after being stable on water seal Continue scheduled Zanaflex (he takes it at home) and scheduled Toradol  As needed narcotics for breakthrough  If recurs would need thoracic evaluation  h/o tobacco abuse w/ stated h/o COPD (no PFTs in chart) Plan Scheduled DuoNeb -changed to as needed dosing Needs PFTs as outpatient   H/o OSA Plan Continue supplemental oxygen at at bedtime and pulse oximetry  Holding CPAP given pneumothorax   H/o CAD and HTN Plan Continue home meds  H/o RLS Plan Continue home meds Chest x-ray post suction placement reviewed CT scan reviewed and result discussed with patient  X-rays reviewed Discussed with spouse at bedside  Best practice:  Diet: advance as tolerated  Pain/Anxiety/Delirium protocol (if indicated): PRN VAP protocol (if indicated):  NA DVT prophylaxis: [place] heparin GI prophylaxis: NA Glucose  control: NA Mobility: as tol  Code Status: full code Family Communication: wife updated  Disposition: to the SDU   Labs   CBC: Recent Labs  Lab 04/07/20 1028 04/08/20 0228  WBC 10.4 9.7  HGB 16.4 14.1  HCT 49.6 43.7  MCV 95.2 94.8  PLT 230 811    Basic Metabolic Panel: Recent Labs  Lab 04/07/20 1028 04/08/20 0228 04/09/20 0240  NA 140 135 139  K 4.6 4.5 4.2  CL 102 104 104  CO2 26 24 25   GLUCOSE 92 125* 93  BUN 17 17 15   CREATININE 0.94 1.02 1.06  CALCIUM 9.4 8.4* 8.6*  MG  --  2.1  --   PHOS  --  4.1  --    GFR: Estimated Creatinine Clearance: 82.1 mL/min (by C-G formula based on SCr of 1.06 mg/dL). Recent Labs  Lab 04/07/20 1028 04/08/20 0228  WBC 10.4 9.7   Sherrilyn Rist, MD [place] [facility] Pager: [phone]

## 2020-04-13 ENCOUNTER — Inpatient Hospital Stay (HOSPITAL_COMMUNITY): Payer: Medicare Other

## 2020-04-13 DIAGNOSIS — J439 Emphysema, unspecified: Secondary | ICD-10-CM

## 2020-04-13 LAB — BASIC METABOLIC PANEL
Anion gap: 11 (ref 5–15)
BUN: 14 mg/dL (ref 8–23)
CO2: 27 mmol/L (ref 22–32)
Calcium: 9.1 mg/dL (ref 8.9–10.3)
Chloride: 103 mmol/L (ref 98–111)
Creatinine, Ser: 0.89 mg/dL (ref 0.61–1.24)
GFR calc Af Amer: >60 mL/min (ref >60)
GFR calc non Af Amer: >60 mL/min (ref >60)
Glucose, Bld: 133 mg/dL — ABNORMAL HIGH (ref 70–99)
Potassium: 4 mmol/L (ref 3.5–5.1)
Sodium: 141 mmol/L (ref 135–145)

## 2020-04-13 LAB — MAGNESIUM: Magnesium: 1.9 mg/dL (ref 1.7–2.4)

## 2020-04-13 MED ORDER — HYDROCORTISONE 1 % EX CREA
TOPICAL_CREAM | Freq: Two times a day (BID) | CUTANEOUS | Status: DC | PRN
  Filled 2020-04-13 (×2): qty 28

## 2020-04-13 MED ORDER — MELATONIN 5 MG PO TABS
10.0000 mg | ORAL_TABLET | Freq: Every day | ORAL | Status: DC
  Administered 2020-04-13 – 2020-04-17 (×5): 10 mg via ORAL
  Filled 2020-04-13 (×5): qty 2

## 2020-04-13 NOTE — Progress Notes (Signed)
eLink Physician-Brief Progress Note Patient Name: Allyn Bertoni DOB: March 09, 1946 MRN: [id number]   Date of Service  04/13/2020  HPI/Events of Note  Patient request for Melatonin dose to be increased to 10 mg PO Q HS which is his home dose.   eICU Interventions  Will increase Melatonin dose to 10 mg PO Q HS.     Intervention Category Major Interventions: Other:  Lysle Dingwall 04/13/2020, 9:19 PM

## 2020-04-13 NOTE — Progress Notes (Addendum)
NAME:  Gregory Suarez, MRN:  [id number], DOB:  Oct 07, 1945, LOS: 6 ADMISSION DATE:  04/07/2020, CONSULTATION DATE:  7/8 REFERRING MD:  Alvino Chapel, CHIEF COMPLAINT:  Recurrent PTX   Brief History   74 year old male patient with history as described below presents 7/8 with recurrent right pneumothorax  No overnight events slight okay the first line. Past Medical History  Spontaneous right pneumothorax hypertension, coronary artery, COPD, restless leg, hyperlipidemia, obstructive sleep apnea Significant Hospital Events   7/8 admitted, right chest tube placed in the emergency room had significant discomfort after being placed on suction this was aborted, left on waterseal 7/9 still having discomfort starting Toradol and scheduled muscle relaxant 9/12 no pneumothorax on CXR. Consults:  Pulmonary consulted  Procedures:  Chest tube placement 7/8  Significant Diagnostic Tests:  CT chest 7/8>>> Chest x-ray 7/10-pneumothorax noted on early morning x-ray, repeat following application of -10 suction reveals decreased pneumothorax  CT chest reviewed by myself 7/9 IMPRESSION: 1. Right-sided chest tube in place without evidence for right-sided pneumothorax. 2. Bibasilar airspace disease concerning for pneumonia, aspiration, or atelectasis. 3. Stable mediastinal and hilar adenopathy, presumably reactive. 4. Probable splenomegaly involving the partially visualized spleen. Micro Data:  none  Antimicrobials:  none  Interim history/subjective:  No distress.   Objective   Blood pressure (Abnormal) 158/76, pulse 91, temperature 97.8 F (36.6 C), temperature source Oral, resp. rate 20, height 6\' 3"  (1.905 m), weight 108.7 kg, SpO2 91 %.        Intake/Output Summary (Last 24 hours) at 04/13/2020 0907 Last data filed at 04/13/2020 0500 Gross per 24 hour  Intake 1514.98 ml  Output 2900 ml  Net -1385.02 ml   Filed Weights   04/11/20 0557 04/12/20 0620 04/13/20 0500  Weight: 107.1 kg 107 kg  108.7 kg    Examination: General this is a 74 year old white male resting in bed. Not in distress HENT NCAT no JVD MMM pulm clear. Decreased bases. No wheezing. No airleak right chest tube on water seal. 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 -no airleak pcxr pending  Plan CXR now; hope to start 24 hr clamping trial today or tomorrow  Am cxr Cont scheduled Zanaflex AM chemistry (after getting scheduled Toradol)   h/o tobacco abuse w/ stated h/o COPD (no PFTs in chart) Plan Ask needed BDs Needs PFTs  Isolated run of VT Plan Check chemistry  H/o OSA Plan Pulse oximetry Oxygen as indicated Holding CPAP  H/o CAD and HTN Plan No change in home meds  H/o RLS Plan Continue home meds   Best practice:  Diet: advance as tolerated  Pain/Anxiety/Delirium protocol (if indicated): PRN VAP protocol (if indicated): NA DVT prophylaxis: [place] heparin GI prophylaxis: NA Glucose control: NA Mobility: as tol  Code Status: full code Family Communication: wife updated  Disposition: to the SDU   Labs   CBC: Recent Labs  Lab 04/07/20 1028 04/08/20 0228  WBC 10.4 9.7  HGB 16.4 14.1  HCT 49.6 43.7  MCV 95.2 94.8  PLT 230 542    Basic Metabolic Panel: Recent Labs  Lab 04/07/20 1028 04/08/20 0228 04/09/20 0240  NA 140 135 139  K 4.6 4.5 4.2  CL 102 104 104  CO2 26 24 25   GLUCOSE 92 125* 93  BUN 17 17 15   CREATININE 0.94 1.02 1.06  CALCIUM 9.4 8.4* 8.6*  MG  --  2.1  --   PHOS  --  4.1  --    GFR: Estimated Creatinine Clearance: 82.7 mL/min (by C-G formula based on SCr of 1.06 mg/dL). Recent Labs  Lab 04/07/20 1028 04/08/20 0228  WBC 10.4 9.7   Erick Colace ACNP-BC [place] Pager # [phone] OR # [phone] if no answer

## 2020-04-14 ENCOUNTER — Inpatient Hospital Stay (HOSPITAL_COMMUNITY): Payer: Medicare Other

## 2020-04-14 MED ORDER — TALC (STERITALC) POWDER FOR INTRAPLEURAL USE
4.0000 g | Freq: Once | INTRAVENOUS | Status: AC
  Administered 2020-04-14: 4 g via INTRAPLEURAL
  Filled 2020-04-14: qty 4

## 2020-04-14 MED ORDER — LIDOCAINE HCL 1 % IJ SOLN
10.0000 mL | Freq: Once | INTRAMUSCULAR | Status: AC
  Administered 2020-04-14: 10 mL via INTRAPLEURAL
  Filled 2020-04-14: qty 20

## 2020-04-14 NOTE — Progress Notes (Signed)
Talc pleurodesis completed earlier today. Pt tolerated well Plan CT to 10cmH2O sxn Repeat cxr in am Hope to dc chest tube in am   Home over weekend hopefully  Erick Colace ACNP-BC [place] Pager # [phone] OR # [phone] if no answer

## 2020-04-14 NOTE — Progress Notes (Signed)
NAME:  Tomer Chalmers, MRN:  [id number], DOB:  [birth year], LOS: 7 ADMISSION DATE:  04/07/2020, CONSULTATION DATE:  7/8 REFERRING MD:  Alvino Chapel, CHIEF COMPLAINT:  Recurrent PTX   Brief History   74 year old male patient with history as described below presents 7/8 with recurrent right pneumothorax  No overnight events slight okay the first line. Past Medical History  Spontaneous right pneumothorax hypertension, coronary artery, COPD, restless leg, hyperlipidemia, obstructive sleep apnea Significant Hospital Events   7/8 admitted, right chest tube placed in the emergency room had significant discomfort after being placed on suction this was aborted, left on waterseal 7/9 still having discomfort starting Toradol and scheduled muscle relaxant 9/12 no pneumothorax on CXR. Consults:  Pulmonary consulted  Procedures:  Chest tube placement 7/8  Significant Diagnostic Tests:  CT chest 7/8>>> Chest x-ray 7/10-pneumothorax noted on early morning x-ray, repeat following application of -10 suction reveals decreased pneumothorax  CT chest reviewed by myself 7/9 IMPRESSION: 1. Right-sided chest tube in place without evidence for right-sided pneumothorax. 2. Bibasilar airspace disease concerning for pneumonia, aspiration, or atelectasis. 3. Stable mediastinal and hilar adenopathy, presumably reactive. 4. Probable splenomegaly involving the partially visualized spleen. Micro Data:  none  Antimicrobials:  none  Interim history/subjective:  No distress.   Objective   Blood pressure (Abnormal) 142/80, pulse 67, temperature 97.6 F (36.4 C), temperature source Oral, resp. rate (Abnormal) 21, height 6\' 3"  (1.905 m), weight 108.7 kg, SpO2 92 %.        Intake/Output Summary (Last 24 hours) at 04/14/2020 0903 Last data filed at 04/14/2020 0500 Gross per 24 hour  Intake 2391.06 ml  Output 3400 ml  Net -1008.94 ml   Filed Weights   04/11/20 0557 04/12/20 0620 04/13/20 0500  Weight: 107.1 kg  107 kg 108.7 kg    Examination: General this is a pleasant 74 year old white male not in acute distress HENT NCAT no JVD MMM Pulm clear. Dec bases Chest tube clamped Card RRR  abd not tender Ext trace edema  Neuro intact   Resolved Hospital Problem list     Assessment & Plan:  Recurrent versus persistent spontaneous Right pneumothorax No airleak. cxr w/ chest tube good position. Looks like tiny right basilar ptx Plan Talc Pleurodesis then leave CT to drain until output < 178ml in a day  Repeat Cxr in am Cont PRN analgesia ->given his pain I will also give intrapleural lidocaine   h/o tobacco abuse w/ stated h/o COPD (no PFTs in chart) Plan PRN BDs PFT as out pt   H/o OSA Plan Oxygen Pulse ox No CPAP for now   H/o CAD and HTN Plan Home meds  H/o RLS Plan Cont home meds  Best practice:  Diet: advance as tolerated  Pain/Anxiety/Delirium protocol (if indicated): PRN VAP protocol (if indicated): NA DVT prophylaxis: [place] heparin GI prophylaxis: NA Glucose control: NA Mobility: as tol  Code Status: full code Family Communication: wife updated  Disposition: to the SDU   Labs   CBC: Recent Labs  Lab 04/07/20 1028 04/08/20 0228  WBC 10.4 9.7  HGB 16.4 14.1  HCT 49.6 43.7  MCV 95.2 94.8  PLT 230 629    Basic Metabolic Panel: Recent Labs  Lab 04/07/20 1028 04/08/20 0228 04/09/20 0240 04/13/20 1008  NA 140 135 139 141  K 4.6 4.5 4.2 4.0  CL 102 104 104 103  CO2 26 24 25 27   GLUCOSE 92 125* 93 133*  BUN 17 17 15 14   CREATININE 0.94  1.02 1.06 0.89  CALCIUM 9.4 8.4* 8.6* 9.1  MG  --  2.1  --  1.9  PHOS  --  4.1  --   --    GFR: Estimated Creatinine Clearance: 98.5 mL/min (by C-G formula based on SCr of 0.89 mg/dL). Recent Labs  Lab 04/07/20 1028 04/08/20 0228  WBC 10.4 9.7   Erick Colace ACNP-BC [place] Pager # [phone] OR # [phone] if no answer

## 2020-04-15 ENCOUNTER — Inpatient Hospital Stay (HOSPITAL_COMMUNITY): Payer: Medicare Other

## 2020-04-15 MED ORDER — LIDOCAINE 5 % EX PTCH
2.0000 | MEDICATED_PATCH | CUTANEOUS | Status: DC
  Administered 2020-04-15 – 2020-04-18 (×4): 2 via TRANSDERMAL
  Filled 2020-04-15 (×4): qty 2

## 2020-04-15 MED ORDER — ALUM & MAG HYDROXIDE-SIMETH 200-200-20 MG/5ML PO SUSP
15.0000 mL | ORAL | Status: DC | PRN
  Administered 2020-04-15 – 2020-04-17 (×2): 15 mL via ORAL
  Filled 2020-04-15 (×2): qty 30

## 2020-04-15 NOTE — Progress Notes (Signed)
NAME:  Gregory Suarez, MRN:  [id number], DOB:  May 01, 1946, LOS: 8 ADMISSION DATE:  04/07/2020, CONSULTATION DATE:  7/8 REFERRING MD:  Alvino Chapel, CHIEF COMPLAINT:  Recurrent PTX   Brief History   74 year old male patient with history as described below presents 7/8 with recurrent right pneumothorax  No overnight events slight okay the first line. Past Medical History  Spontaneous right pneumothorax hypertension, coronary artery, COPD, restless leg, hyperlipidemia, obstructive sleep apnea Significant Hospital Events   7/8 admitted, right chest tube placed in the emergency room had significant discomfort after being placed on suction this was aborted, left on waterseal 7/9 still having discomfort starting Toradol and scheduled muscle relaxant 9/12 no pneumothorax on CXR. 9/15: Talc pleurodesis completed 9/16: Only about 100 mL of pleural fluid drained overnight. Started to complain of right sided chest discomfort so suction removed. A.m. chest x-ray showing small apical pneumothorax. He did have 1 out of 7 airleak noted Consults:  Pulmonary consulted  Procedures:  Chest tube placement 7/8 talc pleurodesis 7/15 Significant Diagnostic Tests:  CT chest 7/8>>> Chest x-ray 7/10-pneumothorax noted on early morning x-ray, repeat following application of -10 suction reveals decreased pneumothorax  CT chest reviewed by myself 7/9 IMPRESSION: 1. Right-sided chest tube in place without evidence for right-sided pneumothorax. 2. Bibasilar airspace disease concerning for pneumonia, aspiration, or atelectasis. 3. Stable mediastinal and hilar adenopathy, presumably reactive. 4. Probable splenomegaly involving the partially visualized spleen. Micro Data:  none  Antimicrobials:  none  Interim history/subjective:  No distress. Does have occasional positional discomfort  Objective   Blood pressure (Abnormal) 137/51, pulse 73, temperature 98 F (36.7 C), temperature source Oral, resp. rate 19,  height 6\' 3"  (1.905 m), weight 109.6 kg, SpO2 (Abnormal) 88 %.        Intake/Output Summary (Last 24 hours) at 04/15/2020 0853 Last data filed at 04/15/2020 0800 Gross per 24 hour  Intake 1343.22 ml  Output 2730 ml  Net -1386.78 ml   Filed Weights   04/12/20 0620 04/13/20 0500 04/15/20 0500  Weight: 107 kg 108.7 kg 109.6 kg    Examination: General this is a 74 year old male patient is resting in bed and currently in no acute distress HEENT normocephalic atraumatic no jugular venous distention is appreciated pulmonary: Clear to auscultation currently room air. He has a 1 out of 7 airleak notable on chest tube assessment cardiac regular rate and rhythm without murmur rub or gallop abdomen is soft nontender no organomegaly his extremities are warm and dry with brisk capillary refill neuro awake oriented no focal deficits  Resolved Hospital Problem list     Assessment & Plan:  Recurrent versus persistent spontaneous Right pneumothorax No airleak. cxr w/ chest tube good position. Looks like tiny right basilar ptx Plan continue chest tube at 15 cm suction  a.m. chest x-ray  as needed analgesia  given his history we need to ensure he has clamping trial for 24 hours prior to removal of chest tube, with active airleak we will reassess this in the morning  h/o tobacco abuse w/ stated h/o COPD (no PFTs in chart) Plan as needed bronchodilators PFT as an outpatient  H/o OSA Plan supplemental oxygen holding CPAP  H/o CAD and HTN Plan Home medications  H/o RLS Plan Home medications  Best practice:  Diet: advance as tolerated  Pain/Anxiety/Delirium protocol (if indicated): PRN VAP protocol (if indicated): NA DVT prophylaxis: [place] heparin GI prophylaxis: NA Glucose control: NA Mobility: as tol  Code Status: full code Family Communication: wife updated  Disposition: to the SDU   Labs   CBC: No results for input(s): WBC, NEUTROABS, HGB, HCT, MCV, PLT in the last 168  hours.  Basic Metabolic Panel: Recent Labs  Lab 04/09/20 0240 04/13/20 1008  NA 139 141  K 4.2 4.0  CL 104 103  CO2 25 27  GLUCOSE 93 133*  BUN 15 14  CREATININE 1.06 0.89  CALCIUM 8.6* 9.1  MG  --  1.9   GFR: Estimated Creatinine Clearance: 98.8 mL/min (by C-G formula based on SCr of 0.89 mg/dL). No results for input(s): PROCALCITON, WBC, LATICACIDVEN in the last 168 hours. Erick Colace ACNP-BC [place] Pager # [phone] OR # [phone] if no answer

## 2020-04-15 NOTE — Progress Notes (Signed)
eLink Physician-Brief Progress Note Patient Name: Gregory Suarez DOB: 12/04/1945 MRN: [id number]   Date of Service  04/15/2020  HPI/Events of Note  Patient with symptoms of indigestion and reflux, states they are his typical reflux symptoms, he denies chest pain or other symptoms.  eICU Interventions  Maalox 15 ml po Q 4 hours  PRN indigestion.        Kerry Kass Izan Miron 04/15/2020, 4:37 AM

## 2020-04-16 ENCOUNTER — Inpatient Hospital Stay (HOSPITAL_COMMUNITY): Payer: Medicare Other

## 2020-04-16 NOTE — Progress Notes (Signed)
  Report called to 3W, Pt will go to x-ray in wheelchair before being transferred to floor.

## 2020-04-16 NOTE — Progress Notes (Signed)
NAME:  Gregory Suarez, MRN:  [id number], DOB:  [birth year], LOS: 9 ADMISSION DATE:  04/07/2020, CONSULTATION DATE:  7/8 REFERRING MD:  Alvino Chapel, CHIEF COMPLAINT:  Recurrent PTX   Brief History   74 year old male patient with history as described below presents 7/8 with recurrent right pneumothorax  No overnight events slight okay the first line. Past Medical History  Spontaneous right pneumothorax hypertension, coronary artery, COPD, restless leg, hyperlipidemia, obstructive sleep apnea Significant Hospital Events   7/8 admitted, right chest tube placed in the emergency room had significant discomfort after being placed on suction this was aborted, left on waterseal 7/9 still having discomfort starting Toradol and scheduled muscle relaxant 9/12 no pneumothorax on CXR. 9/15: Talc pleurodesis completed 9/16: Only about 100 mL of pleural fluid drained overnight. Started to complain of right sided chest discomfort so suction removed. A.m. chest x-ray showing small apical pneumothorax. He did have 1 out of 7 airleak noted  7/17 placed on water seal  Consults:  Pulmonary    Procedures:  Chest tube placement 7/8 talc pleurodesis 7/15 Significant Diagnostic Tests:  CT chest 7/8>>> Chest x-ray 7/10-pneumothorax noted on early morning x-ray, repeat following application of -10 suction reveals decreased pneumothorax    Micro Data:  none  Antimicrobials:  none  Scheduled Meds: . amLODipine  2.5 mg Oral Daily  . aspirin EC  81 mg Oral Daily  . atorvastatin  80 mg Oral Daily  . Chlorhexidine Gluconate Cloth  6 each Topical Daily  . finasteride  5 mg Oral Daily  . furosemide  20 mg Oral Daily  . heparin  5,000 Units Subcutaneous Q8H  . lidocaine  2 patch Transdermal Q24H  . mouth rinse  15 mL Mouth Rinse BID  . melatonin  10 mg Oral QHS  . metoprolol tartrate  25 mg Oral BID  . multivitamin with minerals  1 tablet Oral Daily  . pantoprazole  40 mg Oral BID  . pramipexole  0.5 mg Oral  QHS  . tadalafil  5 mg Oral Daily  . tiZANidine  4 mg Oral TID   Continuous Infusions: . lactated ringers Stopped (04/16/20 0930)   PRN Meds:.alum & mag hydroxide-simeth, docusate sodium, HYDROcodone-acetaminophen, HYDROcodone-homatropine, hydrocortisone cream, ipratropium-albuterol, morphine injection, ondansetron (ZOFRAN) IV, polyethylene glycol  Interim history/subjective:  nad on 2lpm   Objective   Blood pressure (!) 139/52, pulse 84, temperature 98.2 F (36.8 C), temperature source Oral, resp. rate 17, height 6\' 3"  (1.905 m), weight 109.6 kg, SpO2 93 %.        Intake/Output Summary (Last 24 hours) at 04/16/2020 0945 Last data filed at 04/16/2020 0900 Gross per 24 hour  Intake 1250.01 ml  Output 2990 ml  Net -1739.99 ml   Filed Weights   04/12/20 0620 04/13/20 0500 04/15/20 0500  Weight: 107 kg 108.7 kg 109.6 kg    Examination: Tmax 98.5 Pt alert, approp nad @ 30 degrees  No jvd Oropharynx clear,  mucosa nl Neck supple Lungs with distant bs  bilaterally RRR no s3 or or sign murmur Abd soft with nl excursion  Extr warm with no edema or clubbing noted Neuro  Sensorium intace,  no apparent motor deficits    pCXR not done am 6/17    Resolved Hospital Problem list     Assessment & Plan:  Recurrent versus persistent spontaneous Right pneumothorax - no air leak am 7/17 on -10 suction so changed to water seal    Plan >>> repeat PA and lat cxr on way to  floor today       h/o tobacco abuse w/ stated h/o COPD (no PFTs in chart) Plan as needed bronchodilators PFT as an outpatient  H/o OSA Plan supplemental oxygen holding CPAP  H/o CAD and HTN Plan Home medications  H/o RLS Plan Home medications  Best practice:  Diet: advance as tolerated  Pain/Anxiety/Delirium protocol (if indicated): PRN VAP protocol (if indicated): NA DVT prophylaxis: [place] heparin GI prophylaxis: NA Glucose control: NA Mobility: as tol  Code Status: full code Family  Communication: directly discussed plan of care with pt Disposition: to floor am 7/17   Labs   CBC: No results for input(s): WBC, NEUTROABS, HGB, HCT, MCV, PLT in the last 168 hours.  Basic Metabolic Panel: Recent Labs  Lab 04/13/20 1008  NA 141  K 4.0  CL 103  CO2 27  GLUCOSE 133*  BUN 14  CREATININE 0.89  CALCIUM 9.1  MG 1.9     Christinia Gully, MD Pulmonary and [place] [facility] [phone]   After 7:00 pm call Elink  [phone]

## 2020-04-17 ENCOUNTER — Inpatient Hospital Stay (HOSPITAL_COMMUNITY): Payer: Medicare Other

## 2020-04-17 NOTE — Progress Notes (Signed)
cxr with tube clamped x 4 h s ptx  Will skip one dose sq heparin and pull chest tube with f/u cxr in 4 hours

## 2020-04-17 NOTE — Progress Notes (Signed)
NAME:  Gregory Suarez, MRN:  [id number], DOB:  02/10/1946, LOS: 36 ADMISSION DATE:  04/07/2020, CONSULTATION DATE:  7/8 REFERRING MD:  Alvino Chapel, CHIEF COMPLAINT:  Recurrent PTX   Brief History   74 year old male patient with history as described below presents 7/8 with recurrent right pneumothorax  No overnight events slight okay the first line. Past Medical History  Spontaneous right pneumothorax hypertension, coronary artery, COPD, restless leg, hyperlipidemia, obstructive sleep apnea Significant Hospital Events   7/8 admitted, right chest tube placed in the emergency room had significant discomfort after being placed on suction this was aborted, left on waterseal 7/9 still having discomfort starting Toradol and scheduled muscle relaxant 9/12 no pneumothorax on CXR. 9/15: Talc pleurodesis completed 9/16: Only about 100 mL of pleural fluid drained overnight. Started to complain of right sided chest discomfort so suction removed. A.m. chest x-ray showing small apical pneumothorax. He did have 1 out of 7 airleak noted  7/17 placed on water seal   7/18  Clamped tube 8 am > recheck pCXR at 4 h  Consults:  Pulmonary    Procedures:  Chest tube placement 7/8 talc pleurodesis 7/15 Significant Diagnostic Tests:  CT chest 7/9 1. Right-sided chest tube in place without evidence for right-sided pneumothorax. 2. Bibasilar airspace disease concerning for pneumonia, aspiration, or atelectasis. 3. Stable mediastinal and hilar adenopathy, presumably reactive. 4. Probable splenomegaly involving the partially visualized spleen.      Micro Data:  none  Antimicrobials:  none  Scheduled Meds: . amLODipine  2.5 mg Oral Daily  . aspirin EC  81 mg Oral Daily  . atorvastatin  80 mg Oral Daily  . Chlorhexidine Gluconate Cloth  6 each Topical Daily  . finasteride  5 mg Oral Daily  . furosemide  20 mg Oral Daily  . heparin  5,000 Units Subcutaneous Q8H  . lidocaine  2 patch Transdermal Q24H  .  mouth rinse  15 mL Mouth Rinse BID  . melatonin  10 mg Oral QHS  . metoprolol tartrate  25 mg Oral BID  . multivitamin with minerals  1 tablet Oral Daily  . pantoprazole  40 mg Oral BID  . pramipexole  0.5 mg Oral QHS  . tadalafil  5 mg Oral Daily  . tiZANidine  4 mg Oral TID   Continuous Infusions: . lactated ringers Stopped (04/16/20 1200)   PRN Meds:.alum & mag hydroxide-simeth, docusate sodium, HYDROcodone-acetaminophen, HYDROcodone-homatropine, hydrocortisone cream, ipratropium-albuterol, morphine injection, ondansetron (ZOFRAN) IV, polyethylene glycol  Interim history/subjective:  Doing fine on floor now, no cough or cp or sob   Objective   Blood pressure 123/60, pulse (!) 59, temperature 98.1 F (36.7 C), temperature source Oral, resp. rate 20, height 6\' 3"  (1.905 m), weight 106.3 kg, SpO2 96 %.        Intake/Output Summary (Last 24 hours) at 04/17/2020 1003 Last data filed at 04/17/2020 0956 Gross per 24 hour  Intake 505.07 ml  Output 3060 ml  Net -2554.93 ml   Filed Weights   04/13/20 0500 04/15/20 0500 04/17/20 0500  Weight: 108.7 kg 109.6 kg 106.3 kg    Examination: Tmax 99.1  On 2lpm NP  Pt alert, approp nad @ 45 degrees  No jvd Oropharynx clear,  mucosa nl Neck supple Lungs with distant  Bilaterally - no fluctuation at a/f level of water seal, no air leak coughing  RRR no s3 or or sign murmur Abd  Soft/ nl excursion  Extr warm with no edema or clubbing noted Neuro  Sensorium  intact,  no apparent motor deficits     I personally reviewed images and agree with radiology impression as follows:  CXR:   PA and Lat 7/17 pm Stable position of right-sided pleural drainage catheter. Stable minimal right apical pneumothorax. Mild right basilar subsegmental atelectasis is noted.       Resolved Hospital Problem list     Assessment & Plan:  Recurrent versus persistent spontaneous Right pneumothorax - no air leak am 7/18 on  water seal > clamped at 8 am      Plan >>> repeat pCXR at 4 h and pull Chest tube if ok       h/o tobacco abuse w/ stated h/o COPD (no PFTs in chart) Plan as needed bronchodilators PFT as an outpatient  H/o OSA Plan supplemental oxygen holding CPAP  H/o CAD and HTN Plan Home medications  H/o RLS Plan Home medications  Best practice:  Diet: advance as tolerated  Pain/Anxiety/Delirium protocol (if indicated): PRN VAP protocol (if indicated): NA DVT prophylaxis: [place] heparin GI prophylaxis: PPI Glucose control: NA Mobility: as tol  Code Status: full code Family Communication: directly discussed plan of care with pt Disposition: to floor am 7/17   Labs   CBC: No results for input(s): WBC, NEUTROABS, HGB, HCT, MCV, PLT in the last 168 hours.  Basic Metabolic Panel: Recent Labs  Lab 04/13/20 1008  NA 141  K 4.0  CL 103  CO2 27  GLUCOSE 133*  BUN 14  CREATININE 0.89  CALCIUM 9.1  MG 1.9     Christinia Gully, MD Pulmonary and [place] [facility] [phone]   After 7:00 pm call Elink  [phone]

## 2020-04-18 ENCOUNTER — Inpatient Hospital Stay (HOSPITAL_COMMUNITY): Payer: Medicare Other

## 2020-04-18 ENCOUNTER — Ambulatory Visit: Payer: Medicare Other | Admitting: Family Medicine

## 2020-04-18 MED ORDER — LIDOCAINE 5 % EX PTCH: 2.0000 | MEDICATED_PATCH | CUTANEOUS | 0 refills | Status: DC

## 2020-04-18 MED ORDER — ACETAMINOPHEN 325 MG PO TABS
650.0000 mg | ORAL_TABLET | Freq: Four times a day (QID) | ORAL | Status: AC | PRN
Start: 2020-04-18 — End: 2020-05-02

## 2020-04-18 NOTE — Care Management Important Message (Signed)
Important Message  Patient Details IM Letter given to Lennart Pall SW Case Manager to present to the Patient Name: Gregory Suarez MRN: [id number] Date of Birth: [birth year]   Medicare Important Message Given:  Yes     Kerin Salen 04/18/2020, 12:35 PM

## 2020-04-18 NOTE — Discharge Instructions (Signed)
1 

## 2020-04-18 NOTE — Discharge Summary (Signed)
Physician Discharge Summary  Patient ID: Gregory Suarez MRN: [id number] DOB/AGE: 03/11/1946 74 y.o.  Admit date: 04/07/2020 Discharge date: 04/18/2020    Discharge Diagnoses:  Recurrent Right Pneumothorax  Pleuritic Chest Pain  Former Tobacco Abuse  Reported COPD (no PFT's on file) OSA  CAD HTN RLS                                                                      DISCHARGE PLAN BY DIAGNOSIS     Recurrent Right Pneumothorax  Pleuritic Chest Pain  Former Tobacco Abuse  Reported COPD (no PFT's on file)  Discharge Plan: -PRN tylenol for pain  -PRN lidoderm patches for pain  -follow up CXR in office with Dr. [name] -hold driving until seen by Dr. [name]. Ok with pulmoanry if he feels well to drive after follow up CXR on 7/23 -patient instructed if new or worsening chest pain to report to ER immediately  -pt instructed to keep chest tube site clean and dry, no submerging in water.  Ok to shower, clean site with gentle soap and water.   OSA   Discharge Plan: -hold CPAP for one week, ok to resume after   CAD HTN  Discharge Plan: -resume all prior home medications as below  RLS  Discharge Plan: -resume all prior home medications as below                      DISCHARGE SUMMARY   74 y/o M who was admitted on 7/8 with recurrent right sided pneumothorax. He has a history of HTN, CAD, HLD, COPD, RLS, and OSA. He was recently admitted from 6/28-6/30 for spontaneous right sided pneumothorax that required chest tube placement. Hospitalization notable for chest tube placement, suction, subsequent water seal and then removal of chest tube with a normal chest xray evaluation.  After discharge he continued to have shortness of breath with exertion.  He reported discomfort at the chest tube site from skin irritation.  He follow up with his PCP on 7/8 (Dr. [name]) and given symptoms of dyspnea, tightness in his shoulder blade was directed for repeat CXR evaluation which showed a  moderated right sided pneumothorax.  The patient was admitted by Pulmonary for further care.  Small bore chest tube was placed in the ER with significant discomfort on suction after placement, he was transitioned to water seal.  He had difficulty with pain control and was treated with lidoderm patches, muscle relaxant and toradol with improvement.  By 7/12 he had no further pneumothorax on CXR.  He underwent talc pleurodesis on 7/15.  He remained on suction overnight post pleurodesis. Follow up CXR AM of 9/16 showed small apical pneumothorax and air leak noted.  This resolved and he was placed on water seal 7/17.  Chest tube clamped on 7/18 and he tolerated well.  Chest tube was removed with follow up CXR 4 hours post removal and following am without pneumothorax.  The patient was medically cleared 7/19 with plans as above for discharge.               SIGNIFICANT DIAGNOSTIC STUDIES CT chest 7/8>>> Chest x-ray 7/10-pneumothorax noted on early morning x-ray, repeat following application of -10 suction reveals decreased pneumothorax  CT chest  7/9 >> Right-sided chest tube in place without evidence for right-sided pneumothorax.  Bibasilar airspace disease concerning for pneumonia, aspiration, or atelectasis. Stable mediastinal and hilar adenopathy, presumably reactive. Probable splenomegaly involving the partially visualized spleen.  MICRO DATA  COVID 7/8 >> negative   TUBES / LINES Right Chest Tube 7/8 >> 7/18   Discharge Exam: General: elderly male in NAD, wife at bedside Neuro:AAOx4, speech clear, MAE  CV: s1s2 rrr, no m/r/g PULM: non-labored on RA, lungs bilaterally clear.  Chest tube site with skin irritation around site.  See picture below.  GI: soft, non-tender, BSx4 active  Extremities: warm/dry, no edema       Vitals:   04/17/20 2118 04/18/20 0500 04/18/20 0511 04/18/20 1354  BP: 123/66  (!) 122/54 133/75  Pulse: 63  62 67  Resp: _0 Temp: 98.1 F (36.7 C)  97.8 F  (36.6 C) 98.3 F (36.8 C)  TempSrc: Oral  Oral Oral  SpO2: 99%  97% 98%  Weight:  105.2 kg    Height:         Discharge Labs  BMET Recent Labs  Lab 04/13/20 1008  NA 141  K 4.0  CL 103  CO2 27  GLUCOSE 133*  BUN 14  CREATININE 0.89  CALCIUM 9.1  MG 1.9    CBC No results for input(s): HGB, HCT, WBC, PLT in the last 168 hours.  Anti-Coagulation No results for input(s): INR in the last 168 hours.  Discharge Instructions    Call MD for:  difficulty breathing, headache or visual disturbances   Complete by: As directed    Call MD for:  extreme fatigue   Complete by: As directed    Call MD for:  hives   Complete by: As directed    Call MD for:  persistant dizziness or light-headedness   Complete by: As directed    Call MD for:  persistant nausea and vomiting   Complete by: As directed    Call MD for:  redness, tenderness, or signs of infection (pain, swelling, redness, odor or green/yellow discharge around incision site)   Complete by: As directed    Call MD for:  severe uncontrolled pain   Complete by: As directed    Call MD for:  temperature >100.4   Complete by: As directed    Diet - low sodium heart healthy   Complete by: As directed    Discharge wound care:   Complete by: As directed    Keep current dressing in place until 7/20 4pm.  If dressing falls off, you can cover with dry gauze and paper tape.  Careful not to put tape on irritated skin. You can cover with a band aide after 7/21.  Keep site clean and dry.  Ok to clean with gentle soap and water.  Do not scrub site or use perfumes / oils on the site.   Increase activity slowly   Complete by: As directed    1. No driving or heavy lifting until seen by Dr. [name] on 7/23 2. If you have new or worsening chest pain, report to the ER immediately for evaluation  3. If dressing falls off, it is ok to leave open to air 4. Follow up with pulmonary as arranged.   May shower / Bathe   Complete by: As directed      May shower on 7/20.  Do not scrub chest tube site.  Do not bathe or submerge in water for one  week.   Remove dressing in 24 hours   Complete by: As directed         Follow-up Information    Sherrilyn Rist A, MD Follow up on 05/18/2020.   Specialty: Pulmonary Disease Why: Appt at 3:00 PM.  Please arrive at 2:45 for check in  Contact information: [place] [address] [phone]        [name], MD Follow up on 04/22/2020.   Specialty: Family Medicine Why: Follow up with Dr. [name] for hospital follow up with repeat chest xray.   Contact information: [place] [address] [phone]        Troy Sine, MD .   Specialty: Cardiology Contact information: [address] [place] [address] [phone]                Allergies as of 04/18/2020   No Known Allergies     Medication List    TAKE these medications   acetaminophen 325 MG tablet Commonly known as: Tylenol Take 2 tablets (650 mg total) by mouth every 6 (six) hours as needed for up to 14 days for mild pain.   Allergy 4 MG tablet Generic drug: chlorpheniramine Take 8 mg by mouth at bedtime.   amLODipine 2.5 MG tablet Commonly known as: NORVASC Take 1 tablet (2.5 mg total) by mouth daily. What changed: when to take this   aspirin EC 81 MG tablet Take 1 tablet (81 mg total) by mouth daily.   atorvastatin 80 MG tablet Commonly known as: LIPITOR TAKE 1 TABLET BY MOUTH EVERY DAY What changed: when to take this   benzonatate 100 MG capsule Commonly known as: TESSALON Take 100 mg by mouth 3 (three) times daily as needed for cough.   finasteride 5 MG tablet Commonly known as: Proscar Take 1 tablet (5 mg total) by mouth daily.   fluticasone 50 MCG/ACT nasal spray Commonly known as: FLONASE Place 2 sprays into both nostrils daily as needed for allergies or rhinitis.   furosemide 20 MG tablet Commonly known as:  LASIX Take 20 mg by mouth daily.   hydrochlorothiazide 12.5 MG capsule Commonly known as: MICROZIDE Take 12.5 mg by mouth daily as needed (fluid/swelling).   HYDROcodone-homatropine 5-1.5 MG/5ML syrup Commonly known as: HYCODAN Take 5 mLs by mouth every 8 (eight) hours as needed for cough.   lidocaine 5 % Commonly known as: LIDODERM Place 2 patches onto the skin daily. Remove & Discard patch within 12 hours or as directed by MD Start taking on: April 19, 2020   melatonin 5 MG Tabs Take 10 mg by mouth at bedtime.   metoprolol tartrate 25 MG tablet Commonly known as: LOPRESSOR TAKE 1 TABLET BY MOUTH TWICE A DAY   multivitamin with minerals Tabs tablet Take 1 tablet by mouth in the morning and at bedtime.   pantoprazole 40 MG tablet Commonly known as: PROTONIX Take 1 tablet (40 mg total) by mouth 2 (two) times daily.   pramipexole 0.25 MG tablet Commonly known as: MIRAPEX TAKE 2 TABLETS BY MOUTH AT BEDTIME What changed:   how much to take  how to take this  when to take this  additional instructions   tadalafil 5 MG tablet Commonly known as: CIALIS TAKE ONE TABLET BY MOUTH DAILY   tiZANidine 4 MG tablet Commonly known as: Zanaflex Take 1 tablet (4 mg total) by mouth every 8 (eight) hours as needed for muscle  spasms.   valACYclovir 1000 MG tablet Commonly known as: VALTREX Take 1,000 mg by mouth daily as needed (outbreak).            Discharge Care Instructions  (From admission, onward)         Start     Ordered   04/18/20 0000  Discharge wound care:       Comments: Keep current dressing in place until 7/20 4pm.  If dressing falls off, you can cover with dry gauze and paper tape.  Careful not to put tape on irritated skin. You can cover with a band aide after 7/21.  Keep site clean and dry.  Ok to clean with gentle soap and water.  Do not scrub site or use perfumes / oils on the site.   04/18/20 1550            Disposition: Home. No new home  health needs identified at discharge.    Discharged Condition: Trasean Delima has met maximum benefit of inpatient care and is medically stable and cleared for discharge.  Patient is pending follow up as above.      Time spent on disposition:  35 Minutes.   Signed: Noe Gens, NP-C [facility] Pgr: [phone] Office: [phone]

## 2020-04-19 ENCOUNTER — Telehealth: Payer: Self-pay | Admitting: *Deleted

## 2020-04-19 DIAGNOSIS — J9383 Other pneumothorax: Secondary | ICD-10-CM

## 2020-04-19 NOTE — Telephone Encounter (Signed)
Received call from patient wife.   Reports that patient is coming into office for hospital F/U and will need F/U CXR D/T pneumothorax.   PCP made aware and agreeable to ordering.

## 2020-04-22 ENCOUNTER — Ambulatory Visit
Admission: RE | Admit: 2020-04-22 | Discharge: 2020-04-22 | Disposition: A | Discharge status: Home / Self Care | Payer: Medicare Other | Source: Ambulatory Visit | Attending: Family Medicine | Admitting: Family Medicine

## 2020-04-22 ENCOUNTER — Ambulatory Visit (INDEPENDENT_AMBULATORY_CARE_PROVIDER_SITE_OTHER): Payer: Medicare Other | Admitting: Family Medicine

## 2020-04-22 VITALS — BP 110/58 | HR 61 | Temp 98.6°F | Ht 75.0 in | Wt 232.0 lb

## 2020-04-22 DIAGNOSIS — L989 Disorder of the skin and subcutaneous tissue, unspecified: Secondary | ICD-10-CM | POA: Diagnosis not present

## 2020-04-22 DIAGNOSIS — I2510 Atherosclerotic heart disease of native coronary artery without angina pectoris: Secondary | ICD-10-CM | POA: Diagnosis not present

## 2020-04-22 DIAGNOSIS — J939 Pneumothorax, unspecified: Secondary | ICD-10-CM

## 2020-04-22 DIAGNOSIS — D485 Neoplasm of uncertain behavior of skin: Secondary | ICD-10-CM | POA: Diagnosis not present

## 2020-04-22 DIAGNOSIS — M47814 Spondylosis without myelopathy or radiculopathy, thoracic region: Secondary | ICD-10-CM | POA: Diagnosis not present

## 2020-04-22 DIAGNOSIS — J9383 Other pneumothorax: Secondary | ICD-10-CM

## 2020-04-22 DIAGNOSIS — J984 Other disorders of lung: Secondary | ICD-10-CM | POA: Diagnosis not present

## 2020-04-22 DIAGNOSIS — J40 Bronchitis, not specified as acute or chronic: Secondary | ICD-10-CM | POA: Diagnosis not present

## 2020-04-22 DIAGNOSIS — C4491 Basal cell carcinoma of skin, unspecified: Secondary | ICD-10-CM | POA: Diagnosis not present

## 2020-04-22 MED ORDER — MOMETASONE FUROATE 0.1 % EX CREA: 1.0000 "application " | TOPICAL_CREAM | Freq: Two times a day (BID) | CUTANEOUS | 0 refills | Status: DC

## 2020-04-22 NOTE — Progress Notes (Signed)
Subjective:    Patient ID: Gregory Suarez, male    DOB: 11-13-1945, 74 y.o.   MRN: [id number]  HPI  Recently admitted to the hospital and below is his DC summary: Admit date: 04/07/2020 Discharge date: 04/18/2020    Discharge Diagnoses:  Recurrent Right Pneumothorax  Pleuritic Chest Pain  Former Tobacco Abuse  Reported COPD (no PFT's on file) OSA  CAD HTN RLS                                                                                                       DISCHARGE PLAN BY DIAGNOSIS     Recurrent Right Pneumothorax  Pleuritic Chest Pain  Former Tobacco Abuse  Reported COPD (no PFT's on file)  Discharge Plan: -PRN tylenol for pain  -PRN lidoderm patches for pain  -follow up CXR in office with Dr. [name] -hold driving until seen by Dr. [name]. Ok with pulmoanry if he feels well to drive after follow up CXR on 7/23 -patient instructed if new or worsening chest pain to report to ER immediately  -pt instructed to keep chest tube site clean and dry, no submerging in water.  Ok to shower, clean site with gentle soap and water.   OSA   Discharge Plan: -hold CPAP for one week, ok to resume after   CAD HTN  Discharge Plan: -resume all prior home medications as below  RLS  Discharge Plan: -resume all prior home medications as below                                            DISCHARGE SUMMARY   74 y/o M who was admitted on 7/8 with recurrent right sided pneumothorax. He has a history of HTN, CAD, HLD, COPD, RLS, and OSA. He was recently admitted from 6/28-6/30 for spontaneous right sided pneumothorax that required chest tube placement. Hospitalization notable for chest tube placement, suction, subsequent water seal and then removal of chest tube with a normal chest xray evaluation.  After discharge he continued to have shortness of breath with exertion.  He reported discomfort at the chest tube site from skin irritation.  He follow up with his PCP  on 7/8 (Dr. [name]) and given symptoms of dyspnea, tightness in his shoulder blade was directed for repeat CXR evaluation which showed a moderated right sided pneumothorax.  The patient was admitted by Pulmonary for further care.  Small bore chest tube was placed in the ER with significant discomfort on suction after placement, he was transitioned to water seal.  He had difficulty with pain control and was treated with lidoderm patches, muscle relaxant and toradol with improvement.  By 7/12 he had no further pneumothorax on CXR.  He underwent talc pleurodesis on 7/15.  He remained on suction overnight post pleurodesis. Follow up CXR AM of 9/16 showed small apical pneumothorax and air leak noted.  This resolved and he was placed on water seal 7/17.  Chest tube clamped on 7/18  and he tolerated well.  Chest tube was removed with follow up CXR 4 hours post removal and following am without pneumothorax.  The patient was medically cleared 7/19 with plans as above for discharge.   SIGNIFICANT DIAGNOSTIC STUDIES CT chest 7/8>>> Chest x-ray 7/10-pneumothorax noted on early morning x-ray, repeat following application of -10 suction reveals decreased pneumothorax  CT chest 7/9 >> Right-sided chest tube in place without evidence for right-sided pneumothorax.  Bibasilar airspace disease concerning for pneumonia, aspiration, or atelectasis. Stable mediastinal and hilar adenopathy, presumably reactive. Probable splenomegaly involving the partially visualized spleen.  MICRO DATA  COVID 7/8 >> negative  04/22/20 Patient is here today for hospital discharge follow-up.  He had a chest x-ray this morning that showed resolution of the pneumothorax with no residual or recurrent pneumothorax.  Patient denies any chest pain.  He denies any shortness of breath.  He denies any cough or fever.  Patient does have a atypical lesion on the right cheek just anterior to the tragus.  This appears to be either sebaceous hyperplasia  or potentially a squamous cell cancer.  There is an ulceration in the center of the lesion that is concerning to me.  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     amLODipine (NORVASC) 2.5 MG tablet Take 1 tablet (2.5 mg total) by mouth daily. (Patient taking differently: Take 2.5 mg by mouth every evening. ) 90 tablet 3   aspirin EC 81 MG tablet Take 1 tablet (81 mg total) by mouth daily. 90 tablet 3   atorvastatin (LIPITOR) 80 MG tablet TAKE 1 TABLET BY MOUTH EVERY DAY (Patient taking differently: Take 80 mg by mouth every evening. ) 90 tablet 3   benzonatate (TESSALON) 100 MG capsule Take 100 mg by mouth 3 (three) times daily as needed for cough.     chlorpheniramine (ALLERGY) 4 MG tablet Take 8 mg by mouth at bedtime.      finasteride (PROSCAR) 5 MG tablet Take 1 tablet (5 mg total) by mouth daily. 90 tablet 3   fluticasone  (FLONASE) 50 MCG/ACT nasal spray Place 2 sprays into both nostrils daily as needed for allergies or rhinitis.     hydrochlorothiazide (MICROZIDE) 12.5 MG capsule Take 12.5 mg by mouth daily as needed (fluid/swelling).  HYDROcodone-homatropine (HYCODAN) 5-1.5 MG/5ML syrup Take 5 mLs by mouth every 8 (eight) hours as needed for cough. 120 mL 0   lidocaine (LIDODERM) 5 % Place 2 patches onto the skin daily. Remove & Discard patch within 12 hours or as directed by MD 30 patch 0   Melatonin 5 MG TABS Take 10 mg by mouth at bedtime.      metoprolol tartrate (LOPRESSOR) 25 MG tablet TAKE 1 TABLET BY MOUTH TWICE A DAY (Patient taking differently: Take 25 mg by mouth 2 (two) times daily. ) 180 tablet 2   Multiple Vitamin (MULTIVITAMIN WITH MINERALS) TABS tablet Take 1 tablet by mouth in the morning and at bedtime.      pantoprazole (PROTONIX) 40 MG tablet Take 1 tablet (40 mg total) by mouth 2 (two) times daily. 180 tablet 3   pramipexole (MIRAPEX) 0.25 MG tablet TAKE 2 TABLETS BY MOUTH AT BEDTIME (Patient taking differently: Take 0.5 mg by mouth daily. ) 180 tablet 1   tadalafil (CIALIS) 5 MG tablet TAKE ONE TABLET BY MOUTH DAILY (Patient taking differently: Take 5 mg by mouth daily. ) 90 tablet 2   tiZANidine (ZANAFLEX) 4 MG tablet Take 1 tablet (4 mg total) by mouth every 8 (eight) hours as needed for muscle spasms. 30 tablet 1   valACYclovir (VALTREX) 1000 MG tablet Take 1,000 mg by mouth daily as needed (outbreak).     furosemide (LASIX) 20 MG tablet Take 20 mg by mouth daily.     No current facility-administered medications on file prior to visit.   No Known Allergies Social History   Socioeconomic History   Marital status: Married    Spouse name: Not on file   Number of children: Not on file   Years of education: Not on file   Highest education level: Not on file  Occupational History   Occupation: RETIRED  Tobacco Use   Smoking status: Former Smoker    Packs/day: 0.50     Years: 15.00    Pack years: 7.50    Types: Cigarettes    Quit date: 10/01/1988    Years since quitting: 31.5   Smokeless tobacco: Never Used  Substance and Sexual Activity   Alcohol use: No    Alcohol/week: 0.0 standard drinks   Drug use: No   Sexual activity: Yes    Comment: married, 2 grown sons, retired maintenance man  Other Topics Concern   Not on file  Social History Narrative   Not on file   Social Determinants of Health   Financial Resource Strain:    Difficulty of Paying Living Expenses:   Food Insecurity:    Worried About Charity fundraiser in the Last Year:    Arboriculturist in the Last Year:   Transportation Needs:    Film/video editor (Medical):    Lack of Transportation (Non-Medical):   Physical Activity:    Days of Exercise per Week:    Minutes of Exercise per Session:   Stress:    Feeling of Stress :   Social Connections:    Frequency of Communication with Friends and Family:    Frequency of Social Gatherings with Friends and Family:    Attends Religious Services:    Active Member of Clubs or Organizations:    Attends Archivist Meetings:    Marital Status:   Intimate Partner Violence:    Fear of Current or Ex-Partner:    Emotionally Abused:    Physically Abused:  Sexually Abused:    Family History  Problem Relation Age of Onset   Diabetes Other    Breast cancer Other    Coronary artery disease Other       Review of Systems  All other systems reviewed and are negative.      Objective:   Physical Exam Vitals reviewed.  Constitutional:      General: He is not in acute distress.    Appearance: He is well-developed. He is not diaphoretic.  HENT:     Head: Normocephalic and atraumatic.      Right Ear: External ear normal.     Left Ear: External ear normal.     Nose: Nose normal.     Mouth/Throat:     Pharynx: No oropharyngeal exudate.  Eyes:     General: No scleral icterus.        Right eye: No discharge.        Left eye: No discharge.     Conjunctiva/sclera: Conjunctivae normal.     Pupils: Pupils are equal, round, and reactive to light.  Neck:     Thyroid: No thyromegaly.     Vascular: No JVD.     Trachea: No tracheal deviation.  Cardiovascular:     Rate and Rhythm: Normal rate and regular rhythm.     Heart sounds: Normal heart sounds. No murmur heard.  No friction rub. No gallop.   Pulmonary:     Effort: Pulmonary effort is normal. No accessory muscle usage, prolonged expiration or respiratory distress.     Breath sounds: No stridor. No decreased breath sounds, wheezing or rales.  Chest:     Chest wall: No tenderness.    Abdominal:     General: Bowel sounds are normal. There is no distension.     Palpations: Abdomen is soft. There is no mass.     Tenderness: There is no abdominal tenderness. There is no guarding or rebound.  Musculoskeletal:        General: No tenderness. Normal range of motion.     Cervical back: Normal range of motion and neck supple.  Lymphadenopathy:     Cervical: No cervical adenopathy.  Skin:    General: Skin is warm.     Coloration: Skin is not pale.     Findings: No erythema or rash.  Neurological:     Mental Status: He is alert and oriented to person, place, and time.     Cranial Nerves: No cranial nerve deficit.     Motor: No abnormal muscle tone.     Coordination: Coordination normal.     Deep Tendon Reflexes: Reflexes are normal and symmetric.  Psychiatric:        Behavior: Behavior normal.        Thought Content: Thought content normal.        Judgment: Judgment normal.           Assessment & Plan:    Skin lesion of face - Plan: Pathology Report ([facility])  Pneumothorax on right  Using sterile technique, I anesthetized the lesion on his right mandible with 0.1% lidocaine with epinephrine.  I then performed a shave biopsy of the lesion and was sent to pathology in a labeled container.  Hemostasis was achieved  with Drysol and a Band-Aid.  Pneumothorax is completely resolved on the right side both symptomatically and objectively.  Therefore no further treatment is necessary at this time.  We discussed restrictions regarding lifting and physical activity for the next 2 weeks.  Recommended resuming CPAP after 1 week total.  Recommended avoiding any strenuous activity for at least another week and then gradually increasing activity as tolerated.

## 2020-04-26 LAB — PATHOLOGY REPORT

## 2020-04-26 LAB — TISSUE SPECIMEN

## 2020-04-28 ENCOUNTER — Other Ambulatory Visit: Payer: Self-pay

## 2020-04-28 DIAGNOSIS — L989 Disorder of the skin and subcutaneous tissue, unspecified: Secondary | ICD-10-CM

## 2020-05-02 ENCOUNTER — Telehealth: Payer: Self-pay

## 2020-05-02 NOTE — Telephone Encounter (Signed)
Spoke with patient and scheduled for next Monday at 11:45.

## 2020-05-02 NOTE — Telephone Encounter (Signed)
This patient was seen last week by Dr. [name] and had a BX of his face done. You can see results in his chart review and then labs. Results were a basal cell carcinoma.   How should we schedule this patient for you?  He was last seen by you in 2019.

## 2020-05-02 NOTE — Telephone Encounter (Signed)
Best to make a regular appt to examine and discuss results. If we can make it in next 3 wks, that would be best.

## 2020-05-03 ENCOUNTER — Telehealth: Payer: Self-pay | Admitting: Pulmonary Disease

## 2020-05-03 NOTE — Telephone Encounter (Signed)
PA request was received from (pharmacy): [facility] Phone:[phone]  Medication name and strength: Lidcaine 5% patches Ordering Provider: Mannam  Was PA started with CMM?: yes If yes, please enter KEY: [id number] Medication tried and failed: None Covered Alternatives: unknown  PA sent to plan, time frame for approval / denial: 72 hours Routing to Lacey for follow-up

## 2020-05-04 NOTE — Telephone Encounter (Signed)
Checked status of PA, still under review.

## 2020-05-08 ENCOUNTER — Other Ambulatory Visit: Payer: Self-pay | Admitting: Family Medicine

## 2020-05-08 ENCOUNTER — Other Ambulatory Visit: Payer: Self-pay | Admitting: Physician Assistant

## 2020-05-09 ENCOUNTER — Encounter: Payer: Self-pay | Admitting: Dermatology

## 2020-05-09 ENCOUNTER — Other Ambulatory Visit: Payer: Self-pay

## 2020-05-09 ENCOUNTER — Ambulatory Visit (INDEPENDENT_AMBULATORY_CARE_PROVIDER_SITE_OTHER): Payer: Medicare Other | Admitting: Dermatology

## 2020-05-09 DIAGNOSIS — I2510 Atherosclerotic heart disease of native coronary artery without angina pectoris: Secondary | ICD-10-CM | POA: Diagnosis not present

## 2020-05-09 DIAGNOSIS — L578 Other skin changes due to chronic exposure to nonionizing radiation: Secondary | ICD-10-CM | POA: Diagnosis not present

## 2020-05-09 DIAGNOSIS — C44319 Basal cell carcinoma of skin of other parts of face: Secondary | ICD-10-CM | POA: Diagnosis not present

## 2020-05-09 DIAGNOSIS — L820 Inflamed seborrheic keratosis: Secondary | ICD-10-CM

## 2020-05-09 NOTE — Progress Notes (Signed)
Follow-Up Visit   Subjective  Gregory Suarez is a 74 y.o. male who presents for the following: Basal Cell Carcinoma (right mandible - biopsied by Dr. Jenna Luo) and Other (Spot on right ear - wife noticed.).  The following portions of the chart were reviewed this encounter and updated as appropriate:  Tobacco  Allergies  Meds  Problems  Med Hx  Surg Hx  Fam Hx     Review of Systems:  No other skin or systemic complaints except as noted in HPI or Assessment and Plan.  Objective  Well appearing patient in no apparent distress; mood and affect are within normal limits.  A focused examination was performed including face, ears. Relevant physical exam findings are noted in the Assessment and Plan.  Objective  Right Ear: Erythematous keratotic or waxy stuck-on papule or plaque.   Objective  Right Prox Mandible: 1.5 cm healed biopsy site (area biopsied by Dr. Jenna Luo)   Assessment & Plan  Inflamed seborrheic keratosis Right Ear  Destruction of lesion - Right Ear Complexity: simple   Destruction method: cryotherapy   Informed consent: discussed and consent obtained   Timeout:  patient name, date of birth, surgical site, and procedure verified Lesion destroyed using liquid nitrogen: Yes   Region frozen until ice ball extended beyond lesion: Yes   Outcome: patient tolerated procedure well with no complications   Post-procedure details: wound care instructions given    Basal cell carcinoma (BCC) of skin of other part of face Right Prox Mandible  Epidermal / dermal shaving  Lesion diameter (cm):  1.5 Informed consent: discussed and consent obtained   Timeout: patient name, date of birth, surgical site, and procedure verified   Procedure prep:  Patient was prepped and draped in usual sterile fashion Prep type:  Isopropyl alcohol Anesthesia: the lesion was anesthetized in a standard fashion   Anesthetic:  1% lidocaine w/ epinephrine 1-100,000 buffered w/ 8.4%  NaHCO3 Instrument used: flexible razor blade   Hemostasis achieved with: pressure, aluminum chloride and electrodesiccation   Outcome: patient tolerated procedure well   Post-procedure details: sterile dressing applied and wound care instructions given   Dressing type: bandage and petrolatum    Destruction of lesion Complexity: extensive   Destruction method: electrodesiccation and curettage   Informed consent: discussed and consent obtained   Timeout:  patient name, date of birth, surgical site, and procedure verified Procedure prep:  Patient was prepped and draped in usual sterile fashion Prep type:  Isopropyl alcohol Anesthesia: the lesion was anesthetized in a standard fashion   Anesthetic:  1% lidocaine w/ epinephrine 1-100,000 buffered w/ 8.4% NaHCO3 Curettage performed in three different directions: Yes   Electrodesiccation performed over the curetted area: Yes   Lesion length (cm):  1.5 Lesion width (cm):  1.5 Margin per side (cm):  0.2 Final wound size (cm):  1.9 Hemostasis achieved with:  pressure and aluminum chloride Outcome: patient tolerated procedure well with no complications   Post-procedure details: sterile dressing applied and wound care instructions given   Dressing type: bandage and petrolatum    Specimen 1 - Surgical pathology Differential Diagnosis: R/O Residual BCC Check Margins: No 1.5 cm healed biopsy site (area biopsied by Dr. Jenna Luo)  Actinic Damage - diffuse scaly erythematous macules with underlying dyspigmentation - Recommend daily broad spectrum sunscreen SPF 30+ to sun-exposed areas, reapply every 2 hours as needed.  - Call for new or changing lesions.  Return in about 6 months (around 11/09/2020).  I, Ashok Cordia,  CMA, am acting as scribe for Sarina Ser, MD .  Documentation: I have reviewed the above documentation for accuracy and completeness, and I agree with the above.  Sarina Ser, MD

## 2020-05-09 NOTE — Patient Instructions (Signed)

## 2020-05-12 ENCOUNTER — Encounter: Payer: Self-pay | Admitting: Dermatology

## 2020-05-12 NOTE — Chronic Care Management (AMB) (Addendum)
Chronic Care Management Pharmacy  Name: Gregory Suarez  MRN: [id number] DOB: Dec 20, 1945  Chief Complaint/ HPI  Gregory Suarez,  74 y.o. , male presents for their Initial CCM visit with the clinical pharmacist In office.  PCP : [name], MD  Their chronic conditions include: CAD, HTN, GERD, Hyperlipidemia, Erectile dysfunction.  Office Visits: 04/22/2020 ([facility]) - lesion on R jaw Shave biopsy on lesion Pneumothorax completely resolved, no further treatment Patient can resume CPAP after one week  04/07/2020 ([facility]) Labored breathing since leaving hospital 92% o2 sat  03/28/2020 [facility])  Presented with SOB EKG showed non-specific T-abnormality Patient denied EMS but did go to ER with his wife Consult Visit:  02/26/2020 [facility]) -  Leg edema, recommended prn Lasix Amlodipine was reduced to 2.28m daily  No other med changes  Medications: Outpatient Encounter Medications as of 05/13/2020  Medication Sig   amLODipine (NORVASC) 2.5 MG tablet Take 1 tablet (2.5 mg total) by mouth daily. (Patient taking differently: Take 2.5 mg by mouth every evening. )   aspirin EC 81 MG tablet Take 1 tablet (81 mg total) by mouth daily.   atorvastatin (LIPITOR) 80 MG tablet TAKE 1 TABLET BY MOUTH EVERY DAY (Patient taking differently: Take 80 mg by mouth every evening. )   benzonatate (TESSALON) 100 MG capsule Take 100 mg by mouth 3 (three) times daily as needed for cough.   chlorpheniramine (ALLERGY) 4 MG tablet Take 8 mg by mouth at bedtime.    finasteride (PROSCAR) 5 MG tablet Take 1 tablet (5 mg total) by mouth daily.   fluticasone (FLONASE) 50 MCG/ACT nasal spray Place 2 sprays into both nostrils daily as needed for allergies or rhinitis.   furosemide (LASIX) 20 MG tablet TAKE 1 TABLET BY MOUTH EVERY DAY   hydrochlorothiazide (MICROZIDE) 12.5 MG capsule Take 12.5 mg by mouth daily as needed (fluid/swelling).   HYDROcodone-homatropine (HYCODAN) 5-1.5 MG/5ML syrup Take 5 mLs  by mouth every 8 (eight) hours as needed for cough.   lidocaine (LIDODERM) 5 % Place 2 patches onto the skin daily. Remove & Discard patch within 12 hours or as directed by MD   Melatonin 5 MG TABS Take 10 mg by mouth at bedtime.    metoprolol tartrate (LOPRESSOR) 25 MG tablet TAKE 1 TABLET BY MOUTH TWICE A DAY (Patient taking differently: Take 25 mg by mouth 2 (two) times daily. )   mometasone (ELOCON) 0.1 % cream APPLY TO AFFECTED AREA TWICE A DAY   Multiple Vitamin (MULTIVITAMIN WITH MINERALS) TABS tablet Take 1 tablet by mouth in the morning and at bedtime.    pantoprazole (PROTONIX) 40 MG tablet Take 1 tablet (40 mg total) by mouth 2 (two) times daily.   pramipexole (MIRAPEX) 0.25 MG tablet TAKE 2 TABLETS BY MOUTH AT BEDTIME (Patient taking differently: Take 0.5 mg by mouth daily. )   tadalafil (CIALIS) 5 MG tablet TAKE ONE TABLET BY MOUTH DAILY (Patient taking differently: Take 5 mg by mouth daily. )   tiZANidine (ZANAFLEX) 4 MG tablet Take 1 tablet (4 mg total) by mouth every 8 (eight) hours as needed for muscle spasms.   valACYclovir (VALTREX) 1000 MG tablet Take 1,000 mg by mouth daily as needed (outbreak).   No facility-administered encounter medications on file as of 05/13/2020.     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    Difficulty of Paying Living Expenses: Not very hard    Goals Addressed  This Visit's Progress    Pharmacy Care Plan:       CARE PLAN ENTRY (see longitudinal plan of care for additional care plan information)  Current Barriers:  Chronic Disease Management support, education, and care coordination needs related to Hypertension, Hyperlipidemia, GERD, and CAD   Hypertension BP Readings from Last 3 Encounters:  04/22/20 (!) 110/58  04/18/20 133/75  04/07/20 130/70   Pharmacist Clinical Goal(s): Over the next 180 days, patient will work with PharmD and providers to maintain BP goal <130/80 Current regimen:    Amlodipine 2.73m daily Metoprolol tartrate 283mbid Interventions: Reviewed home blood pressure readings Counseled on signs/symptoms of swelling Patient self care activities - Over the next 180 days, patient will: Check BP daily, document, and provide at future appointments Ensure daily salt intake < 2300 mg/day  Hyperlipidemia Lab Results  Component Value Date/Time   LDLCALC 53 12/08/2019 08:01 AM   Pharmacist Clinical Goal(s): Over the next 180 days, patient will work with PharmD and providers to maintain LDL goal < 70 Current regimen:  Atorvastatin 80104maily Interventions: Counseled on importance of statin medications Patient self care activities - Over the next 180 days, patient will: Continue to take medication as directed Focus on medication adherence by pill count. CAD Pharmacist Clinical Goal(s) Over the next 180 days, patient will work with PharmD and providers to optimize medication and minimize symptoms of CAD. Current regimen:  ASA 50m51mterventions: Counseled on signs of abnormal bleeding Patient self care activities - Over the next 180 days, patient will: Continue to focus on medication adherence by pill count GERD Pharmacist Clinical Goal(s) Over the next 180 days, patient will work with PharmD and providers to optimize medication and minimize symptoms related to GERD. Current regimen:  Pantoprazole 40mg20mce daily Interventions: Discussed proper administration time Patient self care activities - Over the next 180 days, patient will: Work on taking 30 minutes before food with each dose. Contact providers with any new or worsening symptoms.  Initial goal documentation         Hypertension   Office blood pressures are  BP Readings from Last 3 Encounters:  04/22/20 (!) 110/58  04/18/20 133/75  04/07/20 130/70    Patient has failed these meds in the past: none noted  Patient checks BP at home  periodically  Patient home BP readings are  ranging: 118-140/60-70 HR 60s Patient is currently controlled on the following medications:  Amlodipine 2.5mg d69my Metoprolol tartrate 25mg b61mBP fairly well controlled at home.  Now taking amlodipine 2.5 mg daily reports swelling has improved drastically.  Trying to elevate feet as much as possible.  No longer taking HCTZ but is using Furosemide 20mg pr29mr swelling.  Plan  Continue current medications     Hyperlipidemia   LDL goal < 70  Lipid Panel     Component Value Date/Time   CHOL 113 12/08/2019 0801   CHOL 102 04/22/2019 1022   TRIG 79 12/08/2019 0801   HDL 44 12/08/2019 0801   HDL 43 04/22/2019 1022   LDLCALC 53 12/08/2019 0801    Hepatic Function Latest Ref Rng & Units 03/29/2020 12/08/2019 04/22/2019  Total Protein 6.5 - 8.1 g/dL 7.1 6.6 6.4  Albumin 3.5 - 5.0 g/dL 3.8 - 4.3  AST 15 - 41 U/L '20 19 26  ' ALT 0 - 44 U/L '18 15 20  ' Alk Phosphatase 38 - 126 U/L 98 - 102  Total Bilirubin 0.3 - 1.2 mg/dL 1.0 0.8 0.7  The ASCVD Risk score Mikey Bussing DC Jr., et al., 2013) failed to calculate for the following reasons:   The valid total cholesterol range is 130 to 320 mg/dL   Patient has failed these meds in past: none noted Patient is currently controlled on the following medications:  Atorvastatin 15m daily  No myalgias noted.  Patient reports adherence, proper administration time.  LDL well controlled.    Plan  Continue current medications  GERD    Patient has failed these meds in past: none noted Patient is currently controlled on the following medications:  Pantoprazole 443m Mentions he has acid reflux at night sometimes.  Taking medication usually after food.  Counseled on taking 30 minutes before eating on empty stomach for both doses to see if this helps symptoms.  Plan  Continue current medications, adjust medication dosing time.  CAD   Patient has failed these meds in past: none noted Patient is currently controlled on the following medications:   ASA 8175mNo abnormal bruising or bleeding.  Controlling other risk factors.  Plan  Continue current medications Erectile dysfunction   Patient has failed these meds in past: none noted Patient is currently controlled on the following medications:  Tadalafil 5mg68mily  Takes daily with no adverse effects.  Plan  Continue current medications Vaccines   Reviewed and discussed patient's vaccination history.    Immunization History  Administered Date(s) Administered   Fluad Quad(high Dose 65+) 07/23/2019   Influenza, High Dose Seasonal PF 07/24/2018   PFIZER SARS-COV-2 Vaccination 10/27/2019, 11/17/2019   Pneumococcal Conjugate-13 01/16/2014   Pneumococcal Polysaccharide-23 02/04/2015   Td 03/31/2009   Tdap 05/24/2009   Zoster 07/06/2011    Plan  Recommended patient receive shingles, Tdap vaccine in office.  Medication Management   Miscellaneous medications:  Tizanidine 4mg 85m OTC's:  Melatonin 10mg 70my MVI Chlorpheniramine 4mg  P57ment currently uses [facility] pharmacy.  Phone #  [phone] reports using vials method to organize medications and promote adherence. Patient denies missed doses of medication.   ChristiBeverly MilchD Clinical Pharmacist [phone] collaborated with the care management provider regarding care management and care coordination activities outlined in this encounter and have reviewed this encounter including documentation in the note and care plan. I am certifying that I agree with the content of this note and encounter as supervising physician.

## 2020-05-13 ENCOUNTER — Ambulatory Visit: Payer: Medicare Other | Admitting: Pharmacist

## 2020-05-13 ENCOUNTER — Other Ambulatory Visit: Payer: Self-pay

## 2020-05-13 DIAGNOSIS — I10 Essential (primary) hypertension: Secondary | ICD-10-CM

## 2020-05-13 DIAGNOSIS — I2510 Atherosclerotic heart disease of native coronary artery without angina pectoris: Secondary | ICD-10-CM

## 2020-05-13 DIAGNOSIS — E785 Hyperlipidemia, unspecified: Secondary | ICD-10-CM

## 2020-05-13 DIAGNOSIS — K219 Gastro-esophageal reflux disease without esophagitis: Secondary | ICD-10-CM

## 2020-05-13 NOTE — Telephone Encounter (Signed)
Patient contacted with advise from Dr. Vaughan Browner, he will purchase lidocaine patches from the drug store. Follow up appointment and directions to clinic reviewed.

## 2020-05-13 NOTE — Telephone Encounter (Signed)
He can use lidocaine OTC patches which does not need prescription.  Can be reviewed at his scheduled follow up with Dr. Ander Slade next week.

## 2020-05-13 NOTE — Patient Instructions (Addendum)
Visit Information Thank you for meeting with me today!  I look forward to working with you to help you meet all of your healthcare goals and answer any questions you may have.  Feel free to contact me anytime!  Goals Addressed            This Visit's Progress   . Pharmacy Care Plan:       CARE PLAN ENTRY (see longitudinal plan of care for additional care plan information)  Current Barriers:  . Chronic Disease Management support, education, and care coordination needs related to Hypertension, Hyperlipidemia, GERD, and CAD   Hypertension BP Readings from Last 3 Encounters:  04/22/20 (!) 110/58  04/18/20 133/75  04/07/20 130/70   . Pharmacist Clinical Goal(s): o Over the next 180 days, patient will work with PharmD and providers to maintain BP goal <130/80 . Current regimen:  o Amlodipine 2.5mg  daily o Metoprolol tartrate 25mg  bid . Interventions: o Reviewed home blood pressure readings o Counseled on signs/symptoms of swelling . Patient self care activities - Over the next 180 days, patient will: o Check BP daily, document, and provide at future appointments o Ensure daily salt intake < 2300 mg/day  Hyperlipidemia Lab Results  Component Value Date/Time   LDLCALC 53 12/08/2019 08:01 AM   . Pharmacist Clinical Goal(s): o Over the next 180 days, patient will work with PharmD and providers to maintain LDL goal < 70 . Current regimen:  o Atorvastatin 80mg  daily . Interventions: o Counseled on importance of statin medications . Patient self care activities - Over the next 180 days, patient will: o Continue to take medication as directed o Focus on medication adherence by pill count. CAD . Pharmacist Clinical Goal(s) o Over the next 180 days, patient will work with PharmD and providers to optimize medication and minimize symptoms of CAD. Marland Kitchen Current regimen:  o ASA 81mg  . Interventions: o Counseled on signs of abnormal bleeding . Patient self care activities - Over the  next 180 days, patient will: o Continue to focus on medication adherence by pill count GERD . Pharmacist Clinical Goal(s) o Over the next 180 days, patient will work with PharmD and providers to optimize medication and minimize symptoms related to GERD. . Current regimen:  o Pantoprazole 40mg  twice daily . Interventions: o Discussed proper administration time . Patient self care activities - Over the next 180 days, patient will: o Work on taking 30 minutes before food with each dose. o Contact providers with any new or worsening symptoms.  Initial goal documentation        Mr. Pokorny was given information about Chronic Care Management services today including:  1. CCM service includes personalized support from designated clinical staff supervised by his physician, including individualized plan of care and coordination with other care providers 2. 24/7 contact phone numbers for assistance for urgent and routine care needs. 3. Standard insurance, coinsurance, copays and deductibles apply for chronic care management only during months in which we provide at least 20 minutes of these services. Most insurances cover these services at 100%, however patients may be responsible for any copay, coinsurance and/or deductible if applicable. This service may help you avoid the need for more expensive face-to-face services. 4. Only one practitioner may furnish and bill the service in a calendar month. 5. The patient may stop CCM services at any time (effective at the end of the month) by phone call to the office staff.  Patient agreed to services and verbal consent obtained.  The patient verbalized understanding of instructions provided today and agreed to receive a mailed copy of patient instruction and/or educational materials. Telephone follow up appointment with pharmacy team member scheduled for: 62 months  Letanya Froh, PharmD Clinical Pharmacist [facility] [phone]   Gastroesophageal Reflux Disease, Adult Gastroesophageal reflux (GER) happens when acid from the stomach flows up into the tube that connects the mouth and the stomach (esophagus). Normally, food travels down the esophagus and stays in the stomach to be digested. However, when a person has GER, food and stomach acid sometimes move back up into the esophagus. If this becomes a more serious problem, the person may be diagnosed with a disease called gastroesophageal reflux disease (GERD). GERD occurs when the reflux:  Happens often.  Causes frequent or severe symptoms.  Causes problems such as damage to the esophagus. When stomach acid comes in contact with the esophagus, the acid may cause soreness (inflammation) in the esophagus. Over time, GERD may create small holes (ulcers) in the lining of the esophagus. What are the causes? This condition is caused by a problem with the muscle between the esophagus and the stomach (lower esophageal sphincter, or LES). Normally, the LES muscle closes after food passes through the esophagus to the stomach. When the LES is weakened or abnormal, it does not close properly, and that allows food and stomach acid to go back up into the esophagus. The LES can be weakened by certain dietary substances, medicines, and medical conditions, including:  Tobacco use.  Pregnancy.  Having a hiatal hernia.  Alcohol use.  Certain foods and beverages, such as coffee, chocolate, onions, and peppermint. What increases the risk? You are more likely to develop this condition if you:  Have an increased body weight.  Have a connective tissue disorder.  Use NSAID medicines. What are the signs or symptoms? Symptoms of this condition include:  Heartburn.  Difficult or painful swallowing.  The feeling of having a lump in the throat.  Abitter taste in the mouth.  Bad breath.  Having a large amount of saliva.  Having an upset or bloated  stomach.  Belching.  Chest pain. Different conditions can cause chest pain. Make sure you see your health care provider if you experience chest pain.  Shortness of breath or wheezing.  Ongoing (chronic) cough or a night-time cough.  Wearing away of tooth enamel.  Weight loss. How is this diagnosed? Your health care provider will take a medical history and perform a physical exam. To determine if you have mild or severe GERD, your health care provider may also monitor how you respond to treatment. You may also have tests, including:  A test to examine your stomach and esophagus with a small camera (endoscopy).  A test thatmeasures the acidity level in your esophagus.  A test thatmeasures how much pressure is on your esophagus.  A barium swallow or modified barium swallow test to show the shape, size, and functioning of your esophagus. How is this treated? The goal of treatment is to help relieve your symptoms and to prevent complications. Treatment for this condition may vary depending on how severe your symptoms are. Your health care provider may recommend:  Changes to your diet.  Medicine.  Surgery. Follow these instructions at home: Eating and drinking   Follow a diet as recommended by your health care provider. This may involve avoiding foods and drinks such as: ? Coffee and tea (with or without caffeine). ? Drinks that containalcohol. ?  Energy drinks and sports drinks. ? Carbonated drinks or sodas. ? Chocolate and cocoa. ? Peppermint and mint flavorings. ? Garlic and onions. ? Horseradish. ? Spicy and acidic foods, including peppers, chili powder, curry powder, vinegar, hot sauces, and barbecue sauce. ? Citrus fruit juices and citrus fruits, such as oranges, lemons, and limes. ? Tomato-based foods, such as red sauce, chili, salsa, and pizza with red sauce. ? Fried and fatty foods, such as donuts, french fries, potato chips, and high-fat dressings. ? High-fat  meats, such as hot dogs and fatty cuts of red and white meats, such as rib eye steak, sausage, ham, and bacon. ? High-fat dairy items, such as whole milk, butter, and cream cheese.  Eat small, frequent meals instead of large meals.  Avoid drinking large amounts of liquid with your meals.  Avoid eating meals during the 2-3 hours before bedtime.  Avoid lying down right after you eat.  Do not exercise right after you eat. Lifestyle   Do not use any products that contain nicotine or tobacco, such as cigarettes, e-cigarettes, and chewing tobacco. If you need help quitting, ask your health care provider.  Try to reduce your stress by using methods such as yoga or meditation. If you need help reducing stress, ask your health care provider.  If you are overweight, reduce your weight to an amount that is healthy for you. Ask your health care provider for guidance about a safe weight loss goal. General instructions  Pay attention to any changes in your symptoms.  Take over-the-counter and prescription medicines only as told by your health care provider. Do not take aspirin, ibuprofen, or other NSAIDs unless your health care provider told you to do so.  Wear loose-fitting clothing. Do not wear anything tight around your waist that causes pressure on your abdomen.  Raise (elevate) the head of your bed about 6 inches (15 cm).  Avoid bending over if this makes your symptoms worse.  Keep all follow-up visits as told by your health care provider. This is important. Contact a health care provider if:  You have: ? New symptoms. ? Unexplained weight loss. ? Difficulty swallowing or it hurts to swallow. ? Wheezing or a persistent cough. ? A hoarse voice.  Your symptoms do not improve with treatment. Get help right away if you:  Have pain in your arms, neck, jaw, teeth, or back.  Feel sweaty, dizzy, or light-headed.  Have chest pain or shortness of breath.  Vomit and your vomit looks  like blood or coffee grounds.  Faint.  Have stool that is bloody or black.  Cannot swallow, drink, or eat. Summary  Gastroesophageal reflux happens when acid from the stomach flows up into the esophagus. GERD is a disease in which the reflux happens often, causes frequent or severe symptoms, or causes problems such as damage to the esophagus.  Treatment for this condition may vary depending on how severe your symptoms are. Your health care provider may recommend diet and lifestyle changes, medicine, or surgery.  Contact a health care provider if you have new or worsening symptoms.  Take over-the-counter and prescription medicines only as told by your health care provider. Do not take aspirin, ibuprofen, or other NSAIDs unless your health care provider told you to do so.  Keep all follow-up visits as told by your health care provider. This is important. This information is not intended to replace advice given to you by your health care provider. Make sure you discuss any questions you have  with your health care provider. Document Revised: 03/26/2018 Document Reviewed: 03/26/2018 Elsevier Patient Education  [employer].

## 2020-05-16 ENCOUNTER — Telehealth: Payer: Self-pay

## 2020-05-16 NOTE — Telephone Encounter (Signed)
Biopsy results discussed with pt  

## 2020-05-16 NOTE — Telephone Encounter (Signed)
-----   Message from [name], MD sent at 05/16/2020  1:16 PM EDT ----- Skin , right prox mandible BASAL CELL CARCINOMA, NODULAR PATTERN  Cancer - BCC Already treated Recheck next visit

## 2020-05-18 ENCOUNTER — Other Ambulatory Visit: Payer: Self-pay

## 2020-05-18 ENCOUNTER — Ambulatory Visit (INDEPENDENT_AMBULATORY_CARE_PROVIDER_SITE_OTHER): Payer: Medicare Other | Admitting: Pulmonary Disease

## 2020-05-18 ENCOUNTER — Encounter: Payer: Self-pay | Admitting: Pulmonary Disease

## 2020-05-18 VITALS — BP 132/72 | HR 67 | Temp 98.1°F | Ht 75.0 in | Wt 246.4 lb

## 2020-05-18 DIAGNOSIS — G4733 Obstructive sleep apnea (adult) (pediatric): Secondary | ICD-10-CM

## 2020-05-18 DIAGNOSIS — J431 Panlobular emphysema: Secondary | ICD-10-CM | POA: Diagnosis not present

## 2020-05-18 DIAGNOSIS — J939 Pneumothorax, unspecified: Secondary | ICD-10-CM | POA: Diagnosis not present

## 2020-05-18 NOTE — Progress Notes (Signed)
Gregory Suarez    [id number]    June 09, 1946  Primary Care Physician:Pickard, [name], MD  Referring Physician: [name], MD [place],  [place] [address]  Chief complaint:   Patient was recently treated for a pneumothorax  HPI:  Recent pneumothorax Required pleurodesis He initially presented to [facility] hospital with a pneumothorax, was managed with a chest tube, chest tube was successfully removed following resolution of his pneumothorax but did have a recurrence of his pneumo He presented back to the hospital and was admitted, was managed conservatively with a chest tube in place for about 7 days and with recurrence of his pneumothorax, had talc pleurodesis Chest tube was successfully removed a couple days later Discharged on 719 He has been doing well at home with stable symptoms He is still limited with activities of daily living Get short of breath with activity He is trying to get more active  Occasionally still has some chest discomfort   Outpatient Encounter Medications as of 05/18/2020  Medication Sig   amLODipine (NORVASC) 2.5 MG tablet Take 1 tablet (2.5 mg total) by mouth daily. (Patient taking differently: Take 2.5 mg by mouth every evening. )   aspirin EC 81 MG tablet Take 1 tablet (81 mg total) by mouth daily.   atorvastatin (LIPITOR) 80 MG tablet TAKE 1 TABLET BY MOUTH EVERY DAY (Patient taking differently: Take 80 mg by mouth every evening. )   benzonatate (TESSALON) 100 MG capsule Take 100 mg by mouth 3 (three) times daily as needed for cough.   chlorpheniramine (ALLERGY) 4 MG tablet Take 8 mg by mouth at bedtime.    finasteride (PROSCAR) 5 MG tablet Take 1 tablet (5 mg total) by mouth daily.   fluticasone (FLONASE) 50 MCG/ACT nasal spray Place 2 sprays into both nostrils daily as needed for allergies or rhinitis.   furosemide (LASIX) 20 MG tablet TAKE 1 TABLET BY MOUTH EVERY DAY   hydrochlorothiazide (MICROZIDE) 12.5 MG  capsule Take 12.5 mg by mouth daily as needed (fluid/swelling).   HYDROcodone-homatropine (HYCODAN) 5-1.5 MG/5ML syrup Take 5 mLs by mouth every 8 (eight) hours as needed for cough.   lidocaine (LIDODERM) 5 % Place 2 patches onto the skin daily. Remove & Discard patch within 12 hours or as directed by MD   Melatonin 5 MG TABS Take 10 mg by mouth at bedtime.    metoprolol tartrate (LOPRESSOR) 25 MG tablet TAKE 1 TABLET BY MOUTH TWICE A DAY (Patient taking differently: Take 25 mg by mouth 2 (two) times daily. )   mometasone (ELOCON) 0.1 % cream APPLY TO AFFECTED AREA TWICE A DAY   Multiple Vitamin (MULTIVITAMIN WITH MINERALS) TABS tablet Take 1 tablet by mouth in the morning and at bedtime.    pantoprazole (PROTONIX) 40 MG tablet Take 1 tablet (40 mg total) by mouth 2 (two) times daily.   pramipexole (MIRAPEX) 0.25 MG tablet TAKE 2 TABLETS BY MOUTH AT BEDTIME (Patient taking differently: Take 0.5 mg by mouth daily. )   tadalafil (CIALIS) 5 MG tablet TAKE ONE TABLET BY MOUTH DAILY (Patient taking differently: Take 5 mg by mouth daily. )   tiZANidine (ZANAFLEX) 4 MG tablet Take 1 tablet (4 mg total) by mouth every 8 (eight) hours as needed for muscle spasms.   valACYclovir (VALTREX) 1000 MG tablet Take 1,000 mg by mouth daily as needed (outbreak).   No facility-administered encounter medications on file as of 05/18/2020.  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   right  temple   Tubular adenoma of colon     Past Surgical History:  Procedure Laterality Date   ANKLE SURGERY     CHOLECYSTECTOMY     CHOLECYSTECTOMY     LEFT HEART CATH AND CORONARY ANGIOGRAPHY N/A 02/13/2019   Procedure: LEFT HEART CATH AND CORONARY ANGIOGRAPHY;  Surgeon: Troy Sine, MD;  Location: [facility];  Service: Cardiovascular;  Laterality: N/A;    Family History  Problem Relation Age of Onset   Diabetes Other    Breast cancer Other    Coronary artery disease Other     Social History   Socioeconomic History   Marital status: Married    Spouse name: Not on file   Number of children: Not on file   Years of education: Not on file   Highest education level: Not on file  Occupational History   Occupation: RETIRED  Tobacco Use   Smoking status: Former Smoker    Packs/day: 0.50    Years: 15.00    Pack years: 7.50    Types: Cigarettes    Quit date: 10/01/1988    Years since quitting: 31.6   Smokeless tobacco: Never Used  Substance and Sexual Activity   Alcohol use: No    Alcohol/week: 0.0 standard drinks   Drug use: No   Sexual activity: Yes    Comment: married, 2 grown sons, retired maintenance man  Other Topics Concern   Not on file  Social History Narrative   Not on file   Social Determinants of Health   Financial Resource Strain: Low Risk    Difficulty of Paying Living Expenses: Not very hard  Food Insecurity:    Worried About Charity fundraiser in the Last Year:    Arboriculturist in the Last Year:   Transportation Needs:    Film/video editor (Medical):    Lack of Transportation (Non-Medical):   Physical Activity:    Days of Exercise per Week:    Minutes of Exercise per Session:   Stress:    Feeling of Stress :   Social Connections:    Frequency of Communication with Friends and Family:    Frequency of Social Gatherings with Friends and Family:    Attends Religious Services:    Active Member of Clubs  or Organizations:    Attends Music therapist:    Marital Status:   Intimate Partner Violence:    Fear of Current or Ex-Partner:    Emotionally Abused:    Physically Abused:    Sexually Abused:     Review of Systems  Constitutional: Negative for fatigue.  Respiratory: Positive for shortness of breath.   Cardiovascular: Positive for chest pain.  Gastrointestinal: Negative.   Psychiatric/Behavioral: Negative for sleep disturbance.    Vitals:   05/18/20 1451  BP: 132/72  Pulse: 67  Temp: 98.1 F (36.7 C)  SpO2: 97%     Physical Exam Constitutional:      Appearance: Normal appearance.  HENT:     Head: Normocephalic.     Mouth/Throat:     Mouth: Mucous membranes are moist.  Eyes:     Pupils: Pupils are equal, round, and reactive to light.  Cardiovascular:     Rate and Rhythm: Normal rate and regular rhythm.     Pulses: Normal pulses.     Heart sounds: Normal heart sounds. No murmur heard.  No friction rub.  Pulmonary:     Effort: Pulmonary effort is normal. No respiratory distress.     Breath sounds: No stridor. No wheezing or rhonchi.  Musculoskeletal:        General: Normal range of motion.     Cervical back: No rigidity or tenderness.  Skin:    General: Skin is warm.  Neurological:     General: No focal deficit present.     Mental Status: He is alert.  Psychiatric:        Mood and Affect: Mood normal.    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

## 2020-05-18 NOTE — Patient Instructions (Signed)
Will see you in 3 months  Continue graded exercises as tolerated  Will plan to do a breathing study if you are stable at the time  Exercise, exercise exercise is what will help at present

## 2020-05-19 ENCOUNTER — Other Ambulatory Visit: Payer: Self-pay | Admitting: *Deleted

## 2020-05-19 DIAGNOSIS — I10 Essential (primary) hypertension: Secondary | ICD-10-CM

## 2020-05-19 DIAGNOSIS — J431 Panlobular emphysema: Secondary | ICD-10-CM

## 2020-05-19 DIAGNOSIS — I340 Nonrheumatic mitral (valve) insufficiency: Secondary | ICD-10-CM

## 2020-05-19 DIAGNOSIS — K219 Gastro-esophageal reflux disease without esophagitis: Secondary | ICD-10-CM

## 2020-05-19 DIAGNOSIS — E785 Hyperlipidemia, unspecified: Secondary | ICD-10-CM

## 2020-05-25 ENCOUNTER — Other Ambulatory Visit: Payer: Self-pay | Admitting: Family Medicine

## 2020-06-10 ENCOUNTER — Telehealth: Payer: Self-pay

## 2020-06-13 NOTE — Telephone Encounter (Signed)
Okay to continue if it is helping his discomfort

## 2020-06-15 NOTE — Telephone Encounter (Signed)
Dr. Ander Slade the PA for the Lidoderm patches has been denied by the insurance.  Is there anything else that you would like to send in to the pharmacy for the pt.  Thanks

## 2020-06-16 NOTE — Telephone Encounter (Signed)
Over-the-counter topical pain creams will be his only other option  Prescription for Percocet may be considered

## 2020-06-16 NOTE — Telephone Encounter (Signed)
Spoke with the pt and notified of response. He states feels he no longer needs anything for the pain. Nothing further needed.

## 2020-06-27 ENCOUNTER — Telehealth: Payer: Self-pay | Admitting: Cardiovascular Disease

## 2020-06-27 DIAGNOSIS — E785 Hyperlipidemia, unspecified: Secondary | ICD-10-CM

## 2020-06-27 DIAGNOSIS — Z79899 Other long term (current) drug therapy: Secondary | ICD-10-CM

## 2020-06-27 NOTE — Telephone Encounter (Signed)
Advised pt nothing noted on the chart but will forward to Dr. Claiborne Billings for his review to see if any labs needed prior to his 11/21/20 appt... pt aware that dr. Claiborne Billings is out of the office this week.

## 2020-06-27 NOTE — Telephone Encounter (Signed)
Patient wants to know if he needs lab work prior to his appt with Dr. Claiborne Billings in February. Please advise.

## 2020-07-22 ENCOUNTER — Other Ambulatory Visit: Payer: Self-pay | Admitting: Family Medicine

## 2020-07-26 DIAGNOSIS — Z23 Encounter for immunization: Secondary | ICD-10-CM | POA: Diagnosis not present

## 2020-07-31 IMAGING — CT CT CHEST W/O CM
2 of 4 series · 15 of 36 positions shown, 18 images · non-contrast
Comparison: February 04, 2019

CLINICAL DATA: Pneumothorax

EXAM:
CT CHEST WITHOUT CONTRAST
TECHNIQUE: Multidetector CT imaging of the chest was performed following the
standard protocol without IV contrast.

[Series 2: thorax · axial · 0.81mm/px · z∈[-23,+281]mm · 12 of 180 slices shown, 15 images]
[im 14/180  mediastinal]
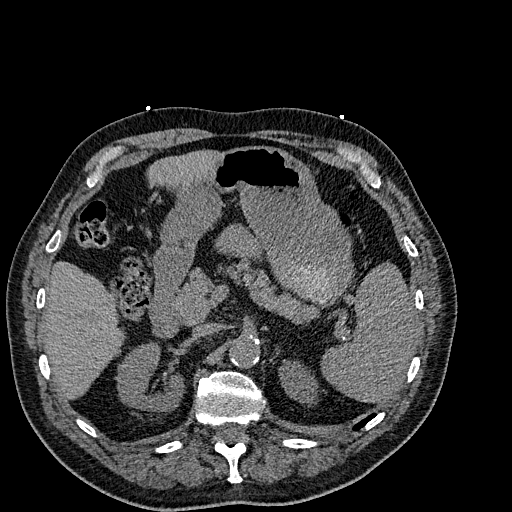
[im 14/180  lung]
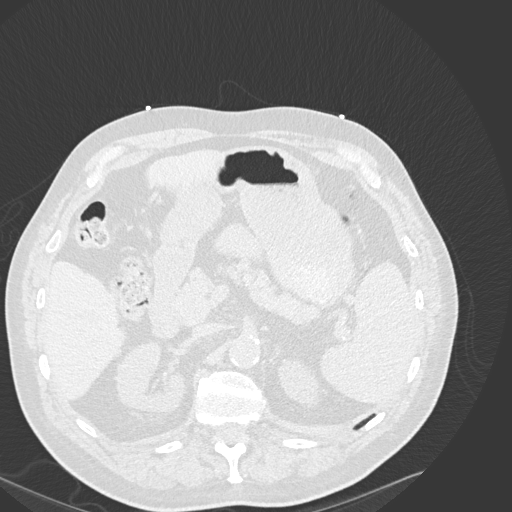
[im 28/180  lung]
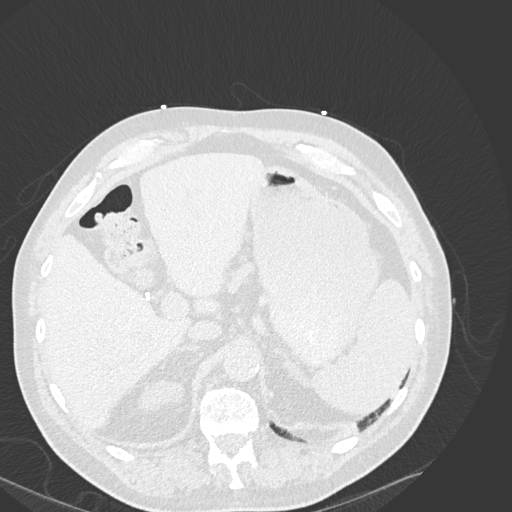
[im 42/180  lung]
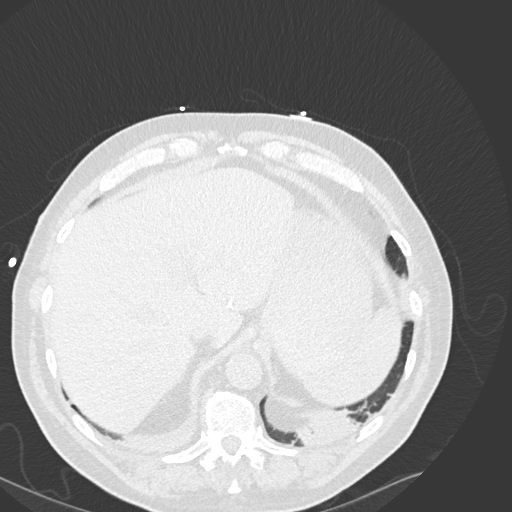
[im 56/180  lung]
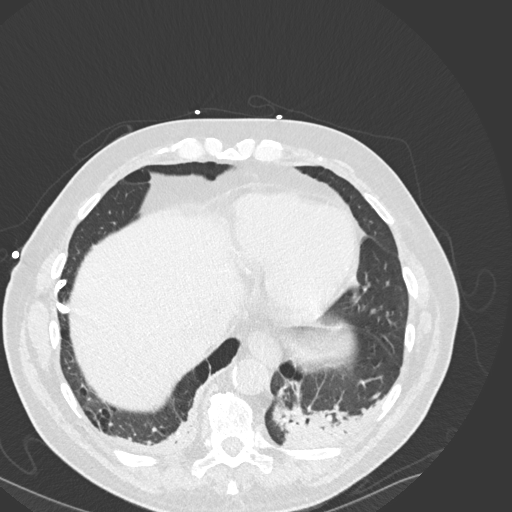
[im 69/180  mediastinal]
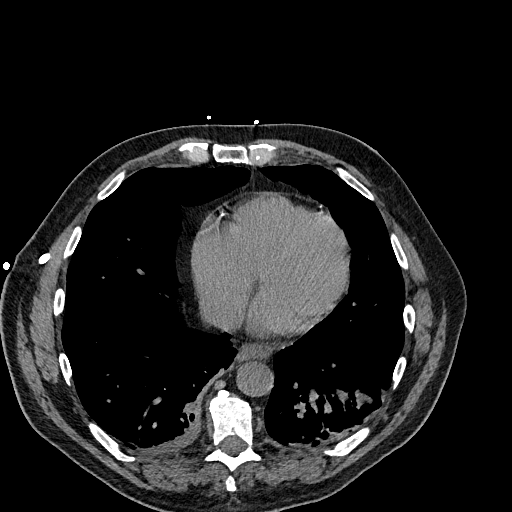
[im 69/180  lung]
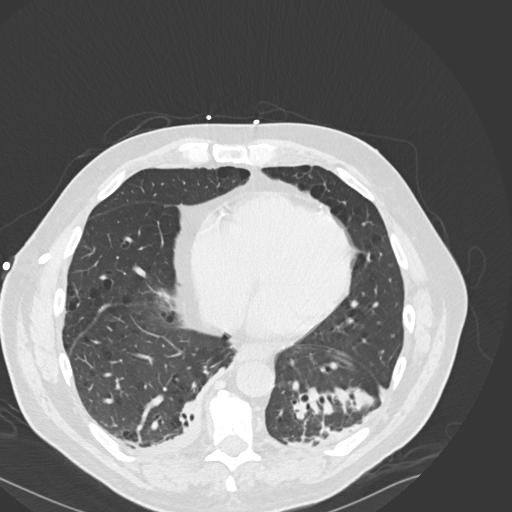
[im 83/180  lung]
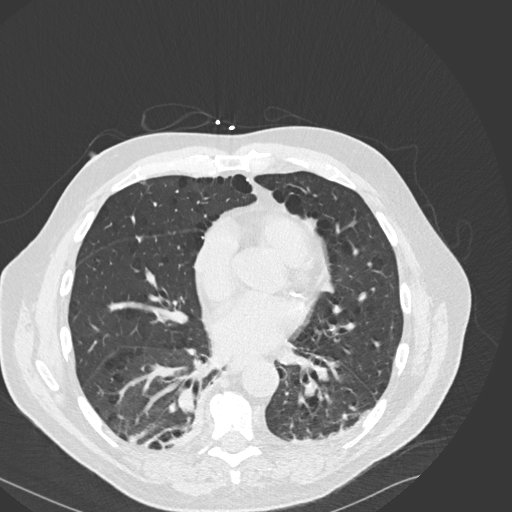
[im 97/180  lung]
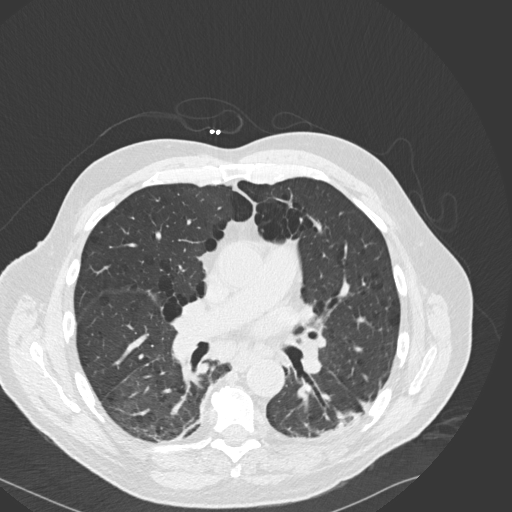
[im 111/180  lung]
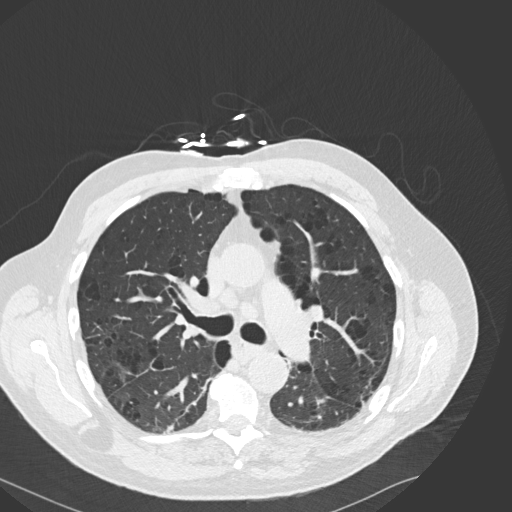
[im 124/180  mediastinal]
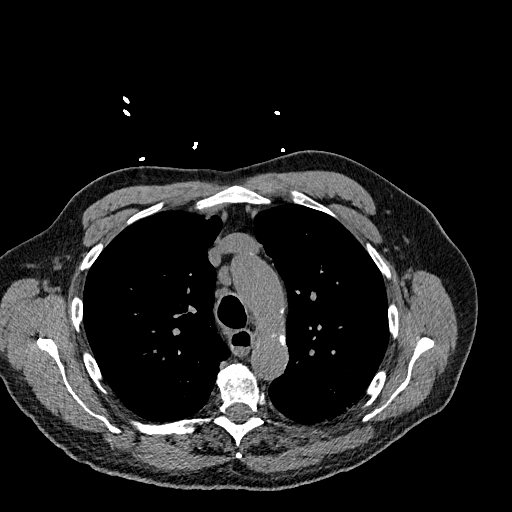
[im 124/180  lung]
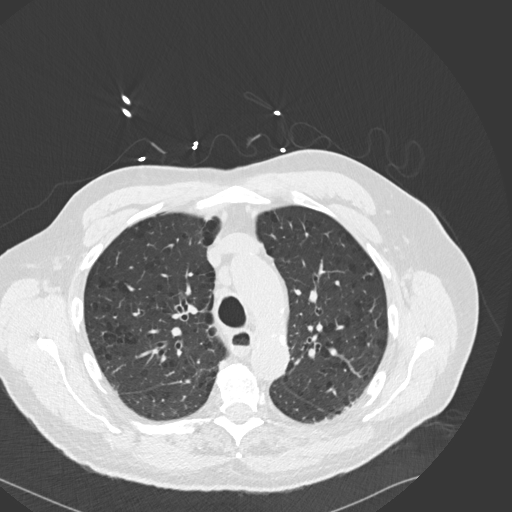
[im 138/180  lung]
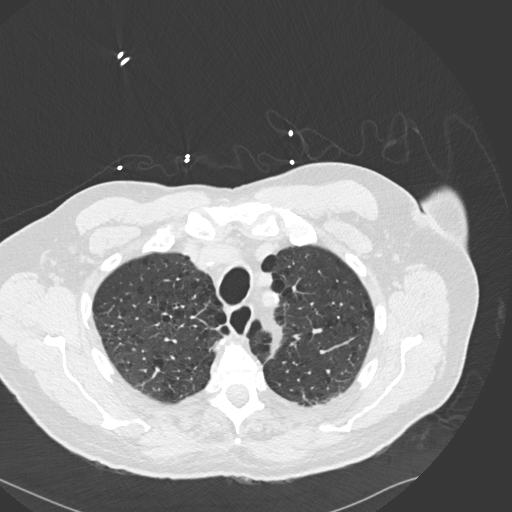
[im 152/180  lung]
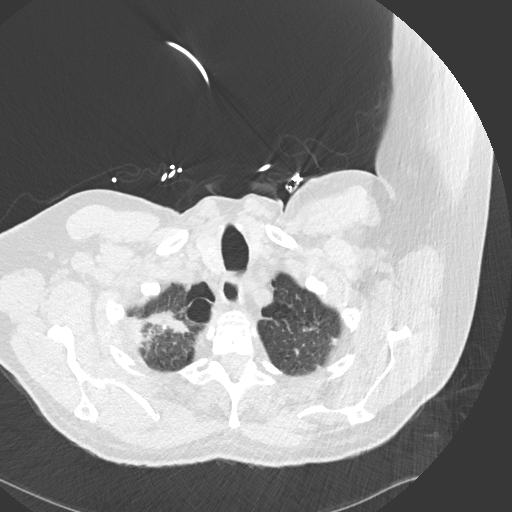
[im 166/180  lung]
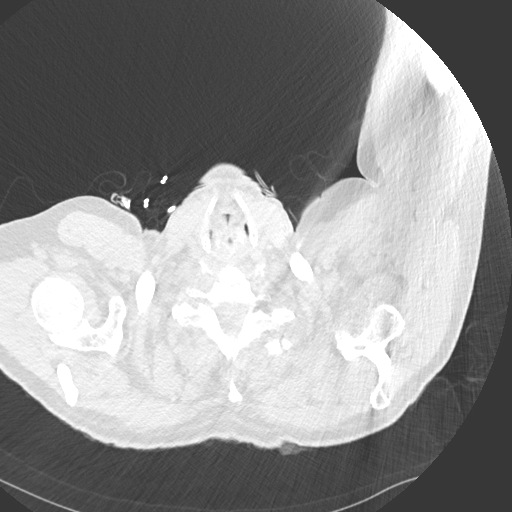

[Series 6: coronal · coronal · 0.70mm/px · 3 of 184 slices shown]
[im 37/184  lung]
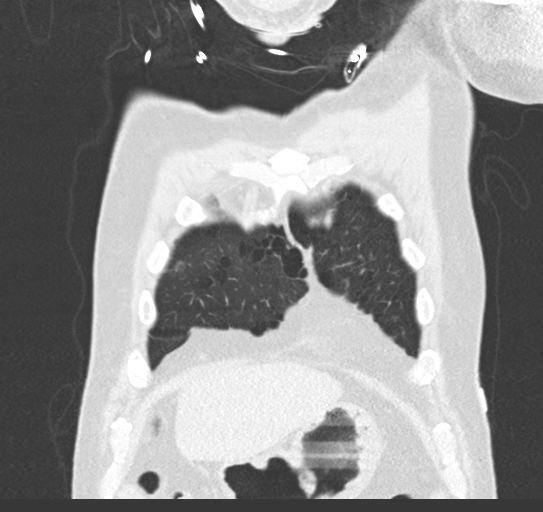
[im 74/184  lung]
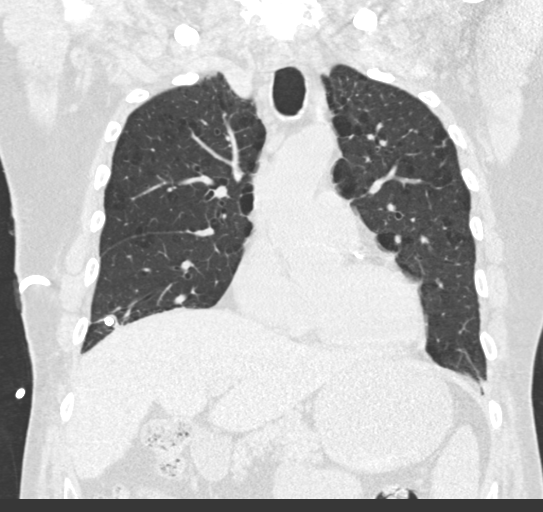
[im 110/184  lung]
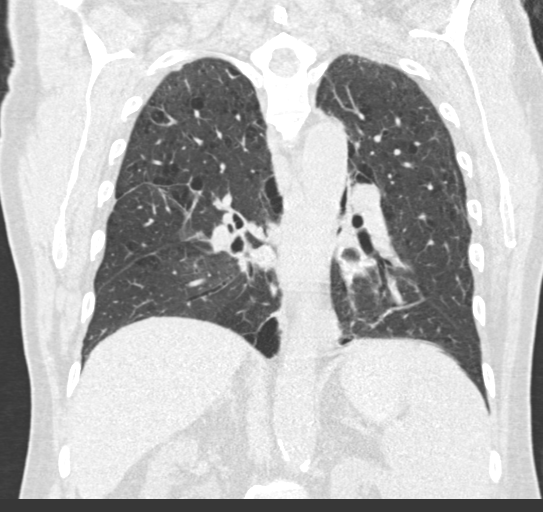

[15 of 36 positions shown; findings below may reference images not displayed]

FINDINGS: Cardiovascular: The heart size is essentially normal. Aortic
calcifications are noted. There is no evidence for thoracic aortic
aneurysm. There are advanced coronary artery calcifications. The
main pulmonary artery does not appear to be significantly dilated.

Mediastinum/Nodes:

--there is nonspecific mediastinal adenopathy which is essentially
stable when compared to prior study dated 11/06/2018.

--there is stable mild hilar adenopathy.

-- No axillary lymphadenopathy.

-- No supraclavicular lymphadenopathy.

-- Normal thyroid gland where visualized.

-the esophagus is mildly patulous and fluid-filled to the mid
thorax.

Lungs/Pleura: Moderate emphysematous changes are noted. There are
stable areas of pleuroparenchymal scarring at the lung apices, right
worse than left. There is a right-sided chest tube in place.
Bibasilar airspace disease is noted. There is no evidence for
right-sided pneumothorax. There is no evidence for left-sided
pneumothorax.

Upper Abdomen: There is probable splenomegaly involving the
partially visualized spleen. The patient is status post prior
cholecystectomy. The remaining portions of the upper abdomen are
essentially unremarkable.

Musculoskeletal: No chest wall abnormality. No bony spinal canal
stenosis.
IMPRESSION: 1. Right-sided chest tube in place without evidence for right-sided
pneumothorax.
2. Bibasilar airspace disease concerning for pneumonia, aspiration,
or atelectasis.
3. Stable mediastinal and hilar adenopathy, presumably reactive.
4. Probable splenomegaly involving the partially visualized spleen.

Aortic Atherosclerosis (1V5R2-TV4.4) and Emphysema (1V5R2-DS6.Y).

## 2020-08-01 IMAGING — DX DG CHEST 1V PORT
1 series · 1 of 1 positions shown · non-contrast
Comparison: April 09, 2020

CLINICAL DATA: Shortness of breath.  Follow-up pneumothorax.

EXAM:
PORTABLE CHEST 1 VIEW

[chest ap]
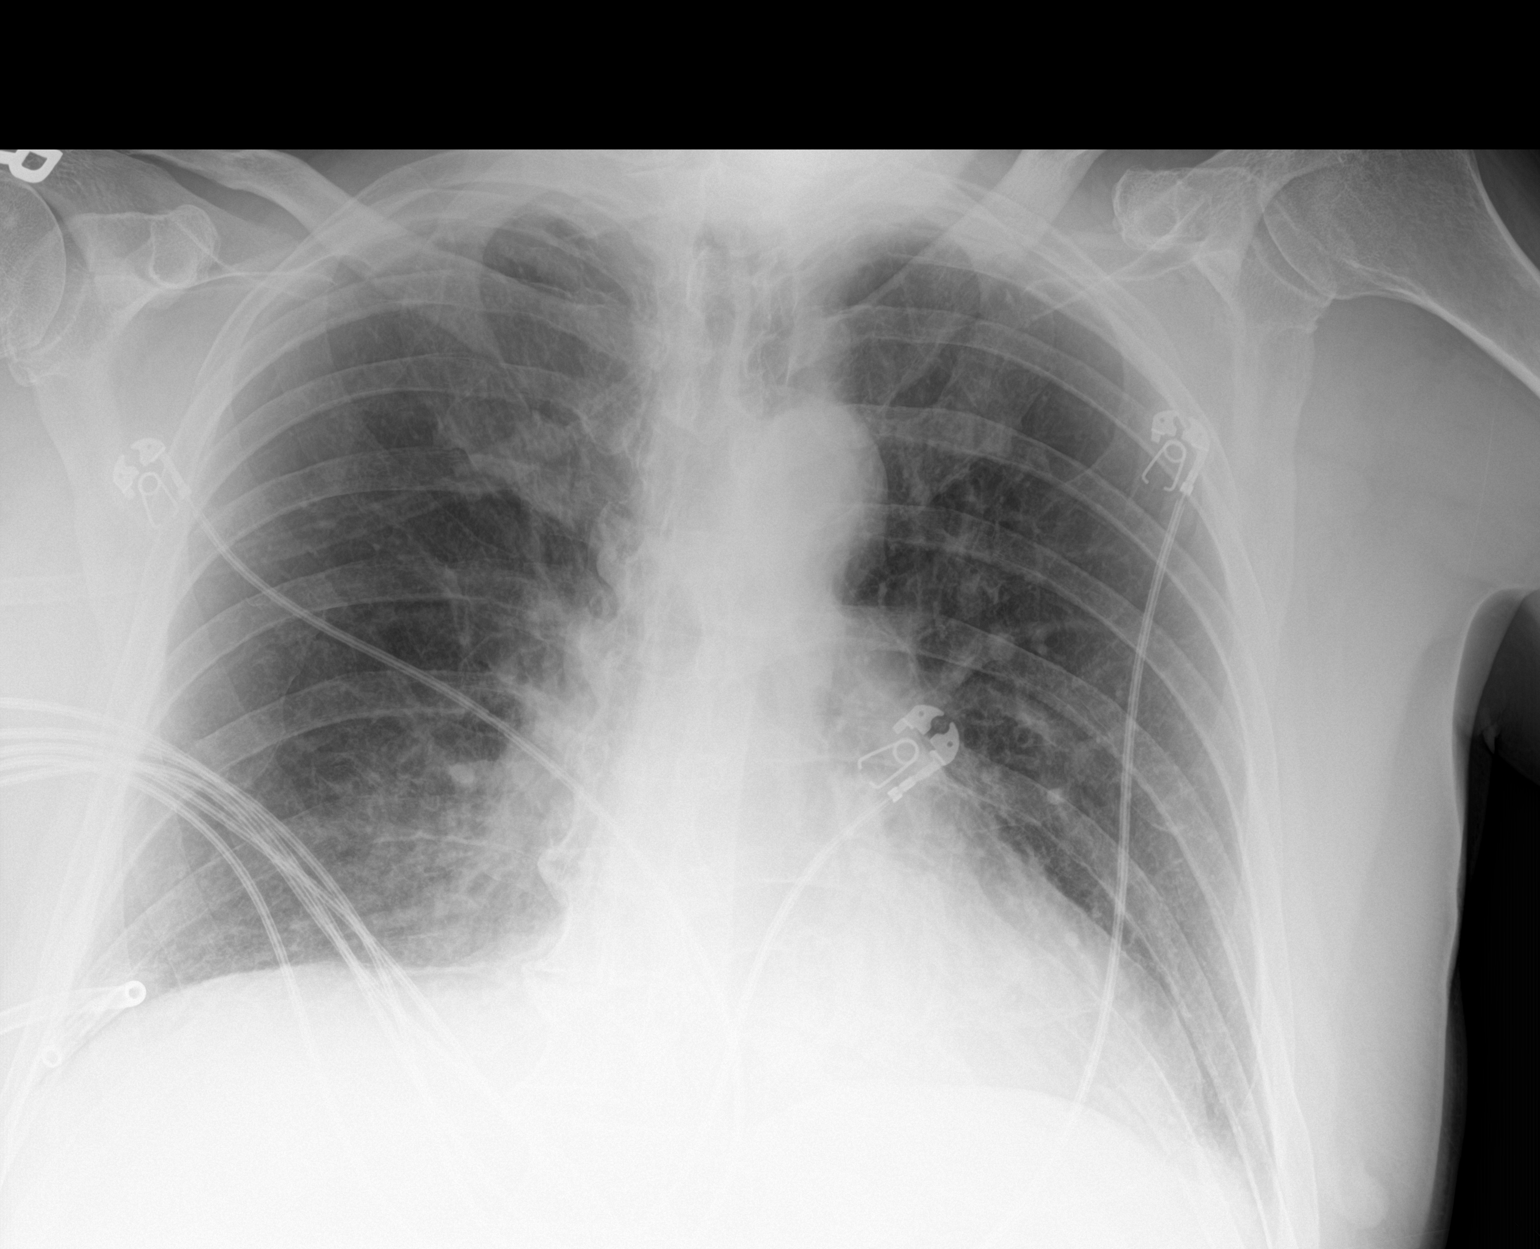

[1 of 1 positions shown; findings below may reference images not displayed]

FINDINGS: A small right-sided pneumothorax remains. The right chest tube
remains in place laterally at the base. No other interval changes.
IMPRESSION: 1. Small right-sided pneumothorax remains. This pneumothorax is a
little smaller in the interval. The right chest tube remains in
place. No other changes.

## 2020-08-01 IMAGING — DX DG CHEST 1V PORT
1 series · 1 of 1 positions shown · non-contrast
Comparison: Chest x-rays dated 04/08/2020 and 04/07/2020. chest CT
dated 04/08/2020.

CLINICAL DATA: Pneumohemothorax.

EXAM:
PORTABLE CHEST 1 VIEW

[chest ap]
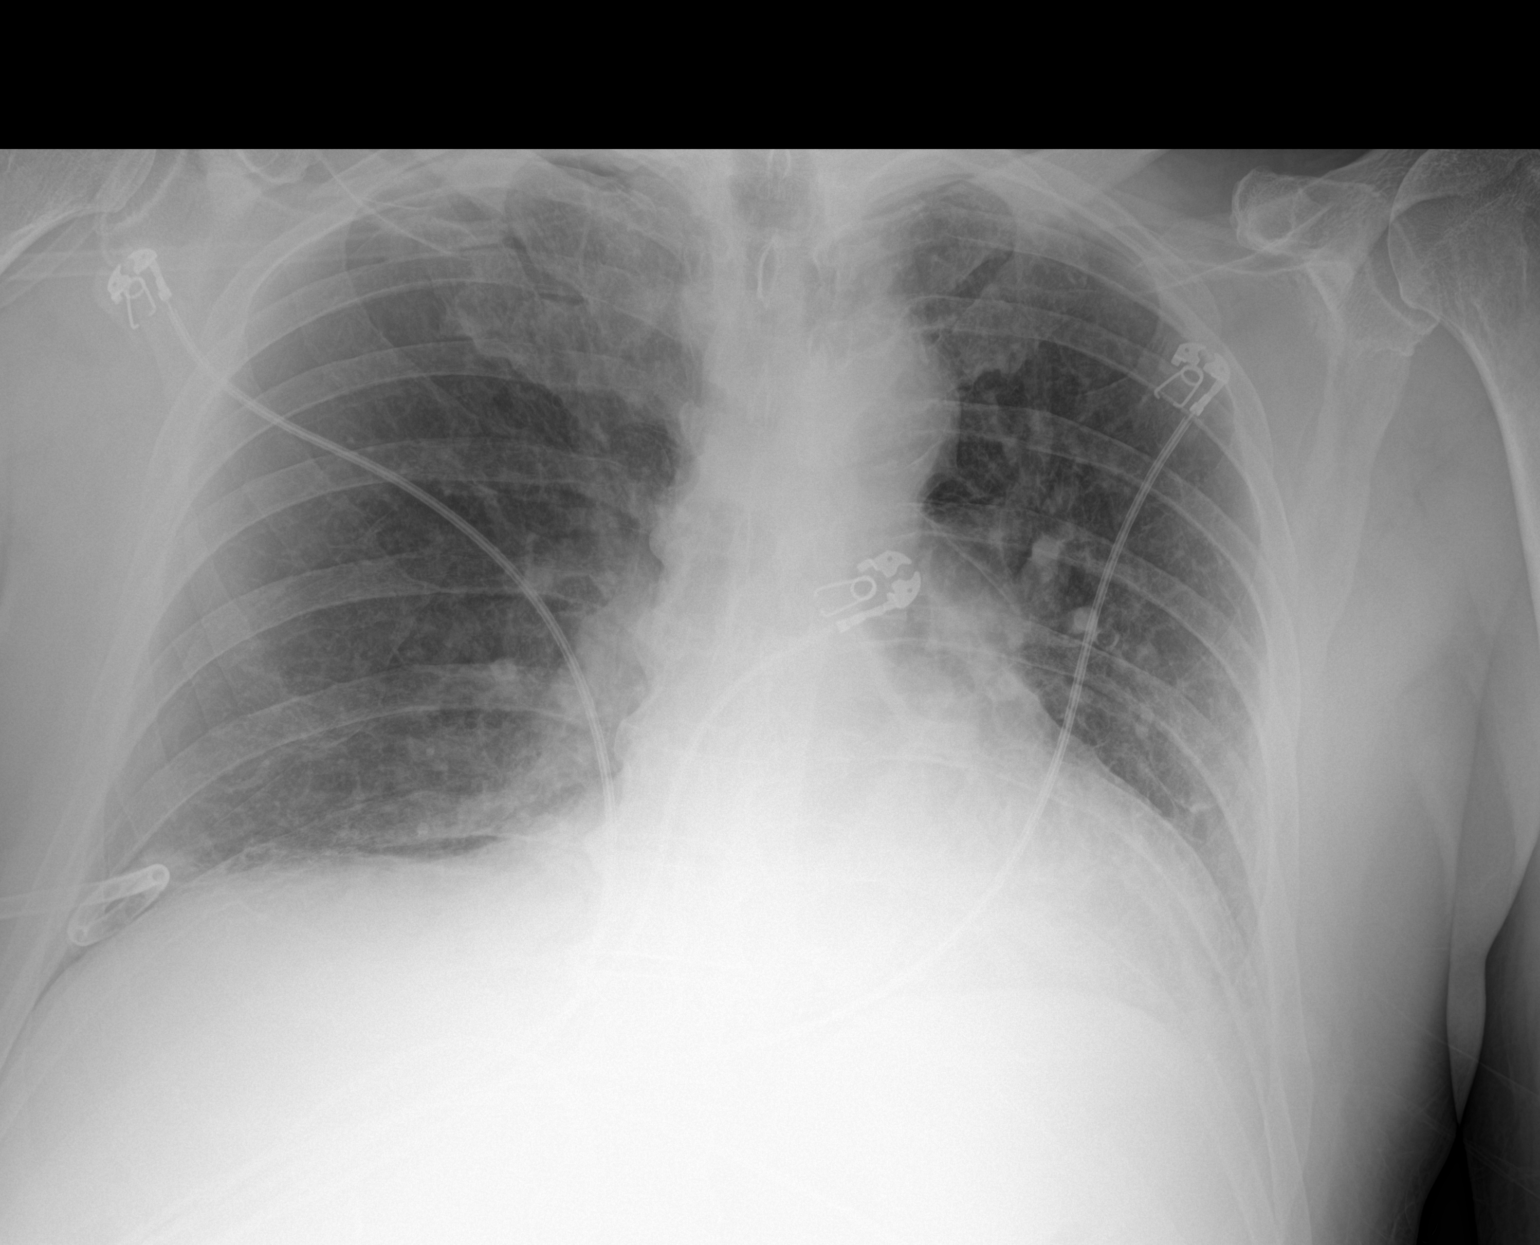

[1 of 1 positions shown; findings below may reference images not displayed]

FINDINGS: Heart size and mediastinal contours are stable. RIGHT basilar chest
tube is stable in position with tip at the costophrenic angle.
Persistent pneumothorax on the RIGHT, now small to moderate in size,
increased compared to most recent chest x-ray and chest CT.

No pleural effusion is seen. Osseous structures about the chest are
unremarkable.
IMPRESSION: 1. Increased size of the RIGHT-sided pneumothorax, now small to
moderate (10-20%) in size.
2. RIGHT-sided chest tube is stable in position, coiled at the
costophrenic angle.

These results will be called to the ordering clinician or
representative by the Radiologist Assistant, and communication
documented in the PACS or [REDACTED].

## 2020-08-01 NOTE — Telephone Encounter (Signed)
[name] for fasting labs

## 2020-08-04 NOTE — Telephone Encounter (Signed)
returned call to pt/wife pt will come the week before scheduled message-fasting

## 2020-08-08 ENCOUNTER — Telehealth: Payer: Self-pay | Admitting: Family Medicine

## 2020-08-08 NOTE — Telephone Encounter (Signed)
Wife called got a bill from [place] for the amount $109.44 wasn't sure why he received a bill maybe it was coded wrong please advise pt [facility] ref# [id number]

## 2020-08-22 NOTE — Telephone Encounter (Signed)
Waiting to hear back from [facility].  Patient is aware that we have sent it to her.

## 2020-09-01 NOTE — Telephone Encounter (Signed)
I have heard back from [place] she states that the dx codes that were used for the [place] were z00.00,z125, [place], and z136. I have asked her to file it with additional codes of Z79.899, and E78.5 which were on the lab order not sure why they weren't used. She has sent the additional codes to billing and they will reprocess the statement.

## 2020-09-05 NOTE — Telephone Encounter (Signed)
[phone] - Gregory Suarez I have updated the diagnosis as requested and rebilled the patient's insurance. Please allow up to 60 days for a response, patient's invoice will be on hold during this time.

## 2020-09-06 ENCOUNTER — Other Ambulatory Visit: Payer: Self-pay | Admitting: *Deleted

## 2020-09-06 MED ORDER — TADALAFIL 5 MG PO TABS: 5.0000 mg | ORAL_TABLET | Freq: Every day | ORAL | 2 refills | Status: DC

## 2020-09-06 NOTE — Telephone Encounter (Signed)
Received fax requesting refill on Cialis.   Ok to refill??

## 2020-09-07 ENCOUNTER — Other Ambulatory Visit: Payer: Self-pay

## 2020-09-07 MED ORDER — TADALAFIL 5 MG PO TABS: 5.0000 mg | ORAL_TABLET | Freq: Every day | ORAL | 2 refills | Status: DC

## 2020-09-08 ENCOUNTER — Other Ambulatory Visit: Payer: Self-pay | Admitting: Family Medicine

## 2020-09-27 ENCOUNTER — Telehealth: Payer: Self-pay | Admitting: Pharmacist

## 2020-09-27 NOTE — Progress Notes (Addendum)
Chronic Care Management Pharmacy Assistant   Name: Gregory Suarez  MRN: [id number] DOB: November 08, 1945  Reason for Encounter:General Disease State Call  Patient Questions:  1.  Have you seen any other providers since your last visit? Yes.    2.  Any changes in your medicines or health? No.    PCP : Gregory Brooks, MD   Their chronic conditions include: CAD, HTN, GERD, Hyperlipidemia, Erectile dysfunction.  Office Visit: None since 05/13/20  Consults: 05/18/20 Pulmonology with Gregory Lightning, MD. No medication changes. Continue all medications and exercises.   Allergies:  No Known Allergies  Medications: Outpatient Encounter Medications as of 09/27/2020  Medication Sig   amLODipine (NORVASC) 2.5 MG tablet Take 1 tablet (2.5 mg total) by mouth daily. (Patient taking differently: Take 2.5 mg by mouth every evening. )   aspirin EC 81 MG tablet Take 1 tablet (81 mg total) by mouth daily.   atorvastatin (LIPITOR) 80 MG tablet TAKE 1 TABLET BY MOUTH EVERY DAY (Patient taking differently: Take 80 mg by mouth every evening. )   benzonatate (TESSALON) 100 MG capsule Take 100 mg by mouth 3 (three) times daily as needed for cough.   chlorpheniramine (ALLERGY) 4 MG tablet Take 8 mg by mouth at bedtime.    finasteride (PROSCAR) 5 MG tablet Take 1 tablet (5 mg total) by mouth daily.   fluticasone (FLONASE) 50 MCG/ACT nasal spray Place 2 sprays into both nostrils daily as needed for allergies or rhinitis.   furosemide (LASIX) 20 MG tablet TAKE 1 TABLET BY MOUTH EVERY DAY   hydrochlorothiazide (MICROZIDE) 12.5 MG capsule Take 12.5 mg by mouth daily as needed (fluid/swelling).   HYDROcodone-homatropine (HYCODAN) 5-1.5 MG/5ML syrup Take 5 mLs by mouth every 8 (eight) hours as needed for cough.   lidocaine (LIDODERM) 5 % Place 2 patches onto the skin daily. Remove & Discard patch within 12 hours or as directed by MD   Melatonin 5 MG TABS Take 10 mg by mouth at bedtime.    metoprolol tartrate  (LOPRESSOR) 25 MG tablet TAKE 1 TABLET BY MOUTH TWICE A DAY   mometasone (ELOCON) 0.1 % cream APPLY TO AFFECTED AREA TWICE A DAY   Multiple Vitamin (MULTIVITAMIN WITH MINERALS) TABS tablet Take 1 tablet by mouth in the morning and at bedtime.    pantoprazole (PROTONIX) 40 MG tablet Take 1 tablet (40 mg total) by mouth 2 (two) times daily.   pramipexole (MIRAPEX) 0.25 MG tablet TAKE 2 TABLETS BY MOUTH AT BEDTIME   tadalafil (CIALIS) 5 MG tablet Take 1 tablet (5 mg total) by mouth daily.   tiZANidine (ZANAFLEX) 4 MG tablet Take 1 tablet (4 mg total) by mouth every 8 (eight) hours as needed for muscle spasms.   valACYclovir (VALTREX) 1000 MG tablet Take 1,000 mg by mouth daily as needed (outbreak).   No facility-administered encounter medications on file as of 09/27/2020.    Current Diagnosis: Patient Active Problem List   Diagnosis Date Noted   Pneumothorax on right 04/07/2020   Spontaneous pneumothorax 03/28/2020   COPD (chronic obstructive pulmonary disease) (HCC) 03/28/2020   Tubular adenoma of colon    RLS (restless legs syndrome)    Abnormal cardiac CT angiography 02/27/2012   Chronic cough 02/27/2012   Hypertension 02/26/2012   Hyperlipidemia 02/26/2012   GERD (gastroesophageal reflux disease)    CAD (coronary artery disease)    Erectile dysfunction    Mitral valve regurgitation     Goals Addressed   None  Patient wife stated he doesn't check his blood pressure regularly but when he does its on the low side. He has a watch to notify 911 if he falls. The family is concerned about him falling. His GERD is not doing well and he is currently on medication for it. He sleeps in a up right position to help with a CPAP machine. The wife stated his restless legs are worse. Gregory Suarez is not having any problems getting his medication at this time his wife stated most of his medications are around $13-$15 dollars.   Follow-Up:  Pharmacist Review   Gregory Suarez, RMA Clinical  Pharmacist Assistant [phone]  5 minutes spent in review, coordination, and documentation.  Reviewed by: Gregory Suarez, PharmD Clinical Pharmacist [facility] [phone]

## 2020-11-08 ENCOUNTER — Encounter: Payer: Self-pay | Admitting: Pulmonary Disease

## 2020-11-08 ENCOUNTER — Ambulatory Visit (INDEPENDENT_AMBULATORY_CARE_PROVIDER_SITE_OTHER): Payer: Medicare Other | Admitting: Pulmonary Disease

## 2020-11-08 ENCOUNTER — Other Ambulatory Visit: Payer: Self-pay

## 2020-11-08 VITALS — BP 118/76 | HR 55 | Temp 98.2°F | Ht 75.0 in | Wt 255.0 lb

## 2020-11-08 DIAGNOSIS — R0602 Shortness of breath: Secondary | ICD-10-CM

## 2020-11-08 NOTE — Patient Instructions (Signed)
Shortness of breath  Obtain PFT at next visit  Follow-up in 6 months  Call with any significant concerns  Continue using your CPAP on a regular basis

## 2020-11-08 NOTE — Progress Notes (Signed)
Gregory Suarez    [id number]    June 03, 1946  Primary Care Physician:Pickard, [name], MD  Referring Physician: [name], MD [address] [place],  [place] [address]  Chief complaint:   Recent treatment for pneumothorax Has been relatively stable  HPI: Recent pneumothorax with pleurodesis Has been doing relatively well Still has an occasional twinge up from the site of chest tube placement however breathing has been relatively fine He is not able to exercise extensively but does stay very active Has a lot of neuropathy that limits his activities  He has a history of obstructive sleep apnea Compliant with CPAP use  Historically He initially presented to [facility] hospital with a pneumothorax, was managed with a chest tube, chest tube was successfully removed following resolution of his pneumothorax but did have a recurrence of his pneumo He presented back to the hospital and was admitted, was managed conservatively with a chest tube in place for about 7 days and with recurrence of his pneumothorax, had talc pleurodesis Chest tube was successfully removed a couple days later Discharged on 719 He has been doing well at home with stable symptoms He is still limited with activities of daily living Get short of breath with activity He is trying to get more active  Occasionally still has some chest discomfort   Outpatient Encounter Medications as of 11/08/2020  Medication Sig  . amLODipine (NORVASC) 2.5 MG tablet Take 1 tablet (2.5 mg total) by mouth daily. (Patient taking differently: Take 2.5 mg by mouth every evening.)  . aspirin EC 81 MG tablet Take 1 tablet (81 mg total) by mouth daily.  Marland Kitchen atorvastatin (LIPITOR) 80 MG tablet TAKE 1 TABLET BY MOUTH EVERY DAY (Patient taking differently: Take 80 mg by mouth every evening.)  . benzonatate (TESSALON) 100 MG capsule Take 100 mg by mouth 3 (three) times daily as needed for cough.  . chlorpheniramine  (CHLOR-TRIMETON) 4 MG tablet Take 8 mg by mouth at bedtime.   . finasteride (PROSCAR) 5 MG tablet Take 1 tablet (5 mg total) by mouth daily.  . fluticasone (FLONASE) 50 MCG/ACT nasal spray Place 2 sprays into both nostrils daily as needed for allergies or rhinitis.  . furosemide (LASIX) 20 MG tablet TAKE 1 TABLET BY MOUTH EVERY DAY  . hydrochlorothiazide (MICROZIDE) 12.5 MG capsule Take 12.5 mg by mouth daily as needed (fluid/swelling).  Marland Kitchen HYDROcodone-homatropine (HYCODAN) 5-1.5 MG/5ML syrup Take 5 mLs by mouth every 8 (eight) hours as needed for cough.  . lidocaine (LIDODERM) 5 % Place 2 patches onto the skin daily. Remove & Discard patch within 12 hours or as directed by MD  . Melatonin 5 MG TABS Take 10 mg by mouth at bedtime.   . metoprolol tartrate (LOPRESSOR) 25 MG tablet TAKE 1 TABLET BY MOUTH TWICE A DAY  . mometasone (ELOCON) 0.1 % cream APPLY TO AFFECTED AREA TWICE A DAY  . Multiple Vitamin (MULTIVITAMIN WITH MINERALS) TABS tablet Take 1 tablet by mouth in the morning and at bedtime.   . pantoprazole (PROTONIX) 40 MG tablet Take 1 tablet (40 mg total) by mouth 2 (two) times daily.  . pramipexole (MIRAPEX) 0.25 MG tablet TAKE 2 TABLETS BY MOUTH AT BEDTIME  . tadalafil (CIALIS) 5 MG tablet Take 1 tablet (5 mg total) by mouth daily.  Marland Kitchen tiZANidine (ZANAFLEX) 4 MG tablet Take 1 tablet (4 mg total) by mouth every 8 (eight) hours as needed for muscle spasms.  Marland Kitchen  valACYclovir (VALTREX) 1000 MG tablet Take 1,000 mg by mouth daily as needed (outbreak).   No facility-administered encounter medications on file as of 11/08/2020.    Allergies as of 11/08/2020  . (No Known Allergies)    Past Medical History:  Diagnosis Date  . Basal cell carcinoma 06/28/2015   left temple  . Basal cell carcinoma 08/05/2017   right cheek inf to zygoma  . Basal cell carcinoma 05/09/2020   right prox zygoma  . CAD (coronary artery disease)   . Elevated PSA   . Erectile dysfunction   . GERD (gastroesophageal  reflux disease)   . Hyperlipidemia   . Hypertension   . Mitral valve regurgitation   . OSA treated with BiPAP   . RLS (restless legs syndrome)   . 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 Tubular adenoma of colon     Past Surgical History:  Procedure Laterality Date  . ANKLE SURGERY    . CHOLECYSTECTOMY    . CHOLECYSTECTOMY    . LEFT HEART CATH AND CORONARY ANGIOGRAPHY N/A 02/13/2019   Procedure: LEFT HEART CATH AND CORONARY ANGIOGRAPHY;  Surgeon: Troy Sine, MD;  Location: [facility];  Service: Cardiovascular;  Laterality: N/A;    Family History  Problem Relation Age of Onset  . Diabetes Other   . Breast cancer Other   . Coronary artery disease Other     Social History   Socioeconomic History  . Marital status: Married    Spouse name: Not on file  . Number of children: Not on file  . Years of education: Not on file  . Highest education level: Not on file  Occupational History  . Occupation: RETIRED  Tobacco Use  . Smoking status: Former Smoker    Packs/day: 0.50    Years: 15.00    Pack years: 7.50    Types: Cigarettes    Quit date: 10/01/1988    Years since quitting: 32.1  . Smokeless tobacco: Never Used  Substance and Sexual Activity  . Alcohol use: No    Alcohol/week: 0.0 standard drinks  . Drug use: No  . Sexual activity: Yes    Comment: married, 2 grown sons, retired maintenance man  Other Topics Concern  . Not on file  Social History Narrative  . Not on file   Social Determinants of Health   Financial Resource Strain: Low Risk   . Difficulty of Paying Living Expenses: Not very hard  Food Insecurity: Not on file  Transportation Needs: Not on file  Physical Activity: Not on file  Stress: Not on file  Social Connections: Not on file  Intimate Partner Violence: Not on file    Review of Systems  Constitutional: Negative for fatigue.  Respiratory: Positive for shortness of breath.   Cardiovascular: Positive for  chest pain.  Gastrointestinal: Negative.   Psychiatric/Behavioral: Negative for sleep disturbance.    Vitals:   11/08/20 1515  BP: 118/76  Pulse: (!) 55  Temp: 98.2 F (36.8 C)  SpO2: 98%     Physical Exam Constitutional:      Appearance: Normal appearance.  HENT:     Head: Normocephalic.  Cardiovascular:     Rate and Rhythm: Normal rate and regular rhythm.     Pulses: Normal pulses.     Heart sounds: Normal heart sounds. No murmur heard. No friction rub.  Pulmonary:     Effort: Pulmonary effort is normal. No respiratory distress.     Breath  sounds: No stridor. No wheezing or rhonchi.  Musculoskeletal:     Cervical back: No rigidity or tenderness.  Neurological:     Mental Status: He is alert.  Psychiatric:        Mood and Affect: Mood normal.    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

## 2020-11-09 ENCOUNTER — Other Ambulatory Visit: Payer: Self-pay | Admitting: Physician Assistant

## 2020-11-09 ENCOUNTER — Encounter: Payer: Self-pay | Admitting: Dermatology

## 2020-11-09 ENCOUNTER — Ambulatory Visit (INDEPENDENT_AMBULATORY_CARE_PROVIDER_SITE_OTHER): Payer: Medicare Other | Admitting: Dermatology

## 2020-11-09 DIAGNOSIS — L821 Other seborrheic keratosis: Secondary | ICD-10-CM

## 2020-11-09 DIAGNOSIS — Z85828 Personal history of other malignant neoplasm of skin: Secondary | ICD-10-CM | POA: Diagnosis not present

## 2020-11-09 DIAGNOSIS — Z1283 Encounter for screening for malignant neoplasm of skin: Secondary | ICD-10-CM

## 2020-11-09 DIAGNOSIS — Z872 Personal history of diseases of the skin and subcutaneous tissue: Secondary | ICD-10-CM

## 2020-11-09 DIAGNOSIS — L219 Seborrheic dermatitis, unspecified: Secondary | ICD-10-CM

## 2020-11-09 DIAGNOSIS — D1800 Hemangioma unspecified site: Secondary | ICD-10-CM | POA: Diagnosis not present

## 2020-11-09 DIAGNOSIS — L820 Inflamed seborrheic keratosis: Secondary | ICD-10-CM | POA: Diagnosis not present

## 2020-11-09 DIAGNOSIS — L814 Other melanin hyperpigmentation: Secondary | ICD-10-CM | POA: Diagnosis not present

## 2020-11-09 DIAGNOSIS — B351 Tinea unguium: Secondary | ICD-10-CM

## 2020-11-09 DIAGNOSIS — D229 Melanocytic nevi, unspecified: Secondary | ICD-10-CM | POA: Diagnosis not present

## 2020-11-09 DIAGNOSIS — L578 Other skin changes due to chronic exposure to nonionizing radiation: Secondary | ICD-10-CM | POA: Diagnosis not present

## 2020-11-09 MED ORDER — TERBINAFINE HCL 250 MG PO TABS: 250.0000 mg | ORAL_TABLET | Freq: Every day | ORAL | 0 refills | Status: DC

## 2020-11-09 MED ORDER — HYDROCORTISONE 2.5 % EX CREA: TOPICAL_CREAM | CUTANEOUS | 3 refills | Status: DC

## 2020-11-09 MED ORDER — KETOCONAZOLE 2 % EX CREA: 1.0000 "application " | TOPICAL_CREAM | CUTANEOUS | 3 refills | Status: AC

## 2020-11-09 NOTE — Progress Notes (Signed)
Follow-Up Visit   Subjective  Gregory Suarez is a 75 y.o. male who presents for the following: Total body skin exam (9m f/u, hx of BCCs, SCC, AKs) and skin peeing (Bil great toes, 1-2 yrs). The patient presents for Total-Body Skin Exam (TBSE) for skin cancer screening and mole check.  The following portions of the chart were reviewed this encounter and updated as appropriate:   Tobacco  Allergies  Meds  Problems  Med Hx  Surg Hx  Fam Hx     Review of Systems:  No other skin or systemic complaints except as noted in HPI or Assessment and Plan.  Objective  Well appearing patient in no apparent distress; mood and affect are within normal limits.  A full examination was performed including scalp, head, eyes, ears, nose, lips, neck, chest, axillae, abdomen, back, buttocks, bilateral upper extremities, bilateral lower extremities, hands, feet, fingers, toes, fingernails, and toenails. All findings within normal limits unless otherwise noted below.  Objective  L temple, R cheek inf to zygoma, R prox mandible: Well healed scar with no evidence of recurrence.   Objective  R temple: Well healed scar with no evidence of recurrence, no lymphadenopathy.   Objective  bil feet, toes, groin: Toenail dystrophy, scaling feet/toes, erythema groin  Objective  L medial pretibial x 1: Erythematous keratotic or waxy stuck-on papule or plaque.   Objective  face: Pink patches with greasy scale.    Assessment & Plan    Lentigines - Scattered tan macules - Discussed due to sun exposure - Benign, observe - Call for any changes  Seborrheic Keratoses - Stuck-on, waxy, tan-brown papules and plaques  - Discussed benign etiology and prognosis. - Observe - Call for any changes  Melanocytic Nevi - Tan-brown and/or pink-flesh-colored symmetric macules and papules - Benign appearing on exam today - Observation - Call clinic for new or changing moles - Recommend daily use of broad spectrum  spf 30+ sunscreen to sun-exposed areas.   Hemangiomas - Red papules - Discussed benign nature - Observe - Call for any changes  Actinic Damage - Chronic, secondary to cumulative UV/sun exposure - diffuse scaly erythematous macules with underlying dyspigmentation - Recommend daily broad spectrum sunscreen SPF 30+ to sun-exposed areas, reapply every 2 hours as needed.  - Call for new or changing lesions.  Skin cancer screening performed today.  History of basal cell carcinoma (BCC) L temple, R cheek inf to zygoma, R prox mandible  Clear. Observe for recurrence. Call clinic for new or changing lesions.  Recommend regular skin exams, daily broad-spectrum spf 30+ sunscreen use, and photoprotection.     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 Observe for recurrence. Call clinic for new or changing lesions.  Recommend regular skin exams, daily broad-spectrum spf 30+ sunscreen use, and photoprotection.     Tinea unguium bil feet, toes, groin  /Tinea Pedis/Tinea Cruris Chronic, Persistent No hx of liver problems, pt is on Atorvastatin  Start Lamisil 250mg  1 po qd  May add Ketoconazole 2% cr on f/u if groin not improving  terbinafine (LAMISIL) 250 MG tablet - bil feet, toes, groin  Inflamed seborrheic keratosis L medial pretibial x 1  Destruction of lesion - L medial pretibial x 1 Complexity: simple   Destruction method: cryotherapy   Informed consent: discussed and consent obtained   Timeout:  patient name, date of birth, surgical site, and procedure verified Lesion destroyed using liquid nitrogen: Yes   Region frozen until ice ball  extended beyond lesion: Yes   Outcome: patient tolerated procedure well with no complications   Post-procedure details: wound care instructions given    Seborrheic dermatitis face  Seborrheic Dermatitis  -  is a chronic persistent rash characterized by pinkness and scaling most commonly of the mid face but also can  occur on the scalp (dandruff), ears; mid chest and mid back. It tends to be exacerbated by stress and cooler weather.  People who have neurologic disease may experience new onset or exacerbation of existing seborrheic dermatitis.  The condition is not curable but treatable and can be controlled.  Start Ketoconazole 2% cr Monday, Wednesday and Fridays hs Start HC 2.5% cr Tuesday, Thursday, Saturday hs  ketoconazole (NIZORAL) 2 % cream - face  hydrocortisone 2.5 % cream - face  Skin cancer screening  Return in about 5 weeks (around 12/14/2020) for recheck ISK, toenails.   I, Othelia Pulling, RMA, am acting as scribe for Sarina Ser, MD .  Documentation: I have reviewed the above documentation for accuracy and completeness, and I agree with the above.  Sarina Ser, MD

## 2020-11-13 ENCOUNTER — Encounter: Payer: Self-pay | Admitting: Dermatology

## 2020-11-14 DIAGNOSIS — Z79899 Other long term (current) drug therapy: Secondary | ICD-10-CM | POA: Diagnosis not present

## 2020-11-14 DIAGNOSIS — E785 Hyperlipidemia, unspecified: Secondary | ICD-10-CM | POA: Diagnosis not present

## 2020-11-14 LAB — COMPREHENSIVE METABOLIC PANEL
ALT: 11 IU/L (ref 0–44)
AST: 18 IU/L (ref 0–40)
Albumin/Globulin Ratio: 1.8 (ref 1.2–2.2)
Albumin: 4.2 g/dL (ref 3.7–4.7)
Alkaline Phosphatase: 107 IU/L (ref 44–121)
BUN/Creatinine Ratio: 13 (ref 10–24)
BUN: 12 mg/dL (ref 8–27)
Bilirubin Total: 0.5 mg/dL (ref 0.0–1.2)
CO2: 24 mmol/L (ref 20–29)
Calcium: 8.9 mg/dL (ref 8.6–10.2)
Chloride: 103 mmol/L (ref 96–106)
Creatinine, Ser: 0.96 mg/dL (ref 0.76–1.27)
GFR calc Af Amer: 90 mL/min/{1.73_m2} (ref >59)
GFR calc non Af Amer: 78 mL/min/{1.73_m2} (ref >59)
Globulin, Total: 2.4 g/dL (ref 1.5–4.5)
Glucose: 80 mg/dL (ref 65–99)
Potassium: 4.3 mmol/L (ref 3.5–5.2)
Sodium: 142 mmol/L (ref 134–144)
Total Protein: 6.6 g/dL (ref 6.0–8.5)

## 2020-11-14 LAB — LIPID PANEL
Chol/HDL Ratio: 2.8 ratio (ref 0.0–5.0)
Cholesterol, Total: 109 mg/dL (ref 100–199)
HDL: 39 mg/dL — ABNORMAL LOW (ref >39)
LDL Chol Calc (NIH): 53 mg/dL (ref 0–99)
Triglycerides: 83 mg/dL (ref 0–149)
VLDL Cholesterol Cal: 17 mg/dL (ref 5–40)

## 2020-11-14 LAB — CBC
Hematocrit: 43.6 % (ref 37.5–51.0)
Hemoglobin: 14.7 g/dL (ref 13.0–17.7)
MCH: 30.6 pg (ref 26.6–33.0)
MCHC: 33.7 g/dL (ref 31.5–35.7)
MCV: 91 fL (ref 79–97)
Platelets: 180 10*3/uL (ref 150–450)
RBC: 4.8 x10E6/uL (ref 4.14–5.80)
RDW: 13.8 % (ref 11.6–15.4)
WBC: 8.4 10*3/uL (ref 3.4–10.8)

## 2020-11-14 LAB — TSH: TSH: 3.08 u[IU]/mL (ref 0.450–4.500)

## 2020-11-15 NOTE — Progress Notes (Unsigned)
Chronic Care Management Pharmacy Note  11/15/2020 Name:  Gregory Suarez MRN:  [id number] DOB:  17-Oct-1945  Subjective: Gregory Suarez is an 75 y.o. year old male who is a primary patient of Pickard, [name], MD.  The CCM team was consulted for assistance with disease management and care coordination needs.    Engaged with patient by telephone for follow up visit in response to provider referral for pharmacy case management and/or care coordination services.   Consent to Services:  The patient was given the following information about Chronic Care Management services today, agreed to services, and gave verbal consent: 1. CCM service includes personalized support from designated clinical staff supervised by the primary care provider, including individualized plan of care and coordination with other care providers 2. 24/7 contact phone numbers for assistance for urgent and routine care needs. 3. Service will only be billed when office clinical staff spend 20 minutes or more in a month to coordinate care. 4. Only one practitioner may furnish and bill the service in a calendar month. 5.The patient may stop CCM services at any time (effective at the end of the month) by phone call to the office staff. 6. The patient will be responsible for cost sharing (co-pay) of up to 20% of the service fee (after annual deductible is met). Patient agreed to services and consent obtained.  Patient Care Team: [name], MD as PCP - General (Family Medicine) Troy Sine, MD as PCP - Cardiology (Cardiology) Edythe Clarity, [facility] as Pharmacist (Pharmacist)  Recent office visits: 04/22/2020 [name]) - lesion on R jaw  Shave biopsy on lesion  Pneumothorax completely resolved, no further treatment  Patient can resume CPAP after one week  04/07/2020 (Pickard)  Labored breathing since leaving hospital  92% o2 sat  Recent consult visits: 11/08/20 Ander Slade, pulmonology) - follow up on pneumothorax,  patient doing well overall.  Plans to do PFT at next follow up.  02/26/2020 Eulas Post, Cardio) -   Leg edema, recommended prn Lasix  Amlodipine was reduced to 2.61m daily   No other med changes  Hospital visits: {Hospital DC Yes/No:25215}  Objective:  Lab Results  Component Value Date   CREATININE 0.96 11/14/2020   BUN 12 11/14/2020   GFRNONAA 78 11/14/2020   GFRAA 90 11/14/2020   NA 142 11/14/2020   K 4.3 11/14/2020   CALCIUM 8.9 11/14/2020   CO2 24 11/14/2020    No results found for: HGBA1C, FRUCTOSAMINE, GFR, MICROALBUR  Last diabetic Eye exam: No results found for: HMDIABEYEEXA  Last diabetic Foot exam: No results found for: HMDIABFOOTEX   Lab Results  Component Value Date   CHOL 109 11/14/2020   HDL 39 (L) 11/14/2020   LDLCALC 53 11/14/2020   TRIG 83 11/14/2020   CHOLHDL 2.8 11/14/2020    Hepatic Function Latest Ref Rng & Units 11/14/2020 03/29/2020 12/08/2019  Total Protein 6.0 - 8.5 g/dL 6.6 7.1 6.6  Albumin 3.7 - 4.7 g/dL 4.2 3.8 -  AST 0 - 40 IU/L '18 20 19  ' ALT 0 - 44 IU/L '11 18 15  ' Alk Phosphatase 44 - 121 IU/L 107 98 -  Total Bilirubin 0.0 - 1.2 mg/dL 0.5 1.0 0.8    Lab Results  Component Value Date/Time   TSH 3.080 11/14/2020 08:49 AM   TSH 1.904 03/29/2020 10:15 AM   TSH 1.89 02/07/2017 08:09 AM    CBC Latest Ref Rng & Units 11/14/2020 04/08/2020 04/07/2020  WBC 3.4 - 10.8 x10E3/uL 8.4 9.7 10.4  Hemoglobin 13.0 - 17.7 g/dL 14.7 14.1 16.4  Hematocrit 37.5 - 51.0 % 43.6 43.7 49.6  Platelets 150 - 450 x10E3/uL 180 184 230    No results found for: VD25OH  Clinical ASCVD: {YES/NO:21197} The ASCVD Risk score Mikey Bussing DC Jr., et al., 2013) failed to calculate for the following reasons:   The valid total cholesterol range is 130 to 320 mg/dL    Depression screen [facility] 2/9 12/10/2019 11/11/2018 07/24/2018  Decreased Interest '2 2 1  ' Down, Depressed, Hopeless '1 2 1  ' PHQ - 2 Score '3 4 2  ' Altered sleeping '3 3 3  ' Tired, decreased energy '3 3 3  ' Change in appetite  '3 3 3  ' Feeling bad or failure about yourself  '1 2 1  ' Trouble concentrating 2 3 0  Moving slowly or fidgety/restless 0 3 1  Suicidal thoughts 0 1 0  PHQ-9 Score '15 22 13  ' Difficult doing work/chores Somewhat difficult Very difficult Somewhat difficult     ***Other: (CHADS2VASc if Afib, MMRC or CAT for COPD, ACT, DEXA)  Social History   Tobacco Use  Smoking Status Former Smoker  . Packs/day: 0.50  . Years: 15.00  . Pack years: 7.50  . Types: Cigarettes  . Quit date: 10/01/1988  . Years since quitting: 32.1  Smokeless Tobacco Never Used   BP Readings from Last 3 Encounters:  11/08/20 118/76  05/18/20 132/72  04/22/20 (!) 110/58   Pulse Readings from Last 3 Encounters:  11/08/20 (!) 55  05/18/20 67  04/22/20 61   Wt Readings from Last 3 Encounters:  11/08/20 255 lb (115.7 kg)  05/18/20 246 lb 6.4 oz (111.8 kg)  04/22/20 (!) 232 lb (105.2 kg)    Assessment/Interventions: Review of patient past medical history, allergies, medications, health status, including review of consultants reports, laboratory and other test data, was performed as part of comprehensive evaluation and provision of chronic care management services.   SDOH:  (Social Determinants of Health) assessments and interventions performed: {yes/no:20286}   CCM Care Plan  No Known Allergies  Medications Reviewed Today    Reviewed by [name], MD (Physician) on 11/13/20 at 1338  Med List Status: <None>  Medication Order Taking? Sig Documenting Provider Last Dose Status Informant  amLODipine (NORVASC) 2.5 MG tablet [id number]  TAKE 1 TABLET BY MOUTH EVERY DAY Troy Sine, MD  Active   aspirin EC 81 MG tablet [id number] No Take 1 tablet (81 mg total) by mouth daily. Troy Sine, MD Taking Active Self  atorvastatin (LIPITOR) 80 MG tablet [id number] No TAKE 1 TABLET BY MOUTH EVERY DAY  Patient taking differently: Take 80 mg by mouth every evening.   Leanor Kail, PA Taking Active Self   benzonatate (TESSALON) 100 MG capsule [id number] No Take 100 mg by mouth 3 (three) times daily as needed for cough. [provider] Taking Active Self  chlorpheniramine (CHLOR-TRIMETON) 4 MG tablet [id number] No Take 8 mg by mouth at bedtime.  [provider] Taking Active Self  finasteride (PROSCAR) 5 MG tablet [id number] No Take 1 tablet (5 mg total) by mouth daily. [name], MD Taking Active Self  fluticasone Asencion Islam) 50 MCG/ACT nasal spray [id number] No Place 2 sprays into both nostrils daily as needed for allergies or rhinitis. [provider] Taking Active Self  furosemide (LASIX) 20 MG tablet [id number] No TAKE 1 TABLET BY MOUTH EVERY DAY Troy Sine, MD Taking Active   hydrochlorothiazide (MICROZIDE) 12.5 MG capsule [id number] No Take 12.5 mg  by mouth daily as needed (fluid/swelling). [provider] Taking Active Self  HYDROcodone-homatropine (HYCODAN) 5-1.5 MG/5ML syrup [id number] No Take 5 mLs by mouth every 8 (eight) hours as needed for cough. [name], MD Taking Active Self  hydrocortisone 2.5 % cream [id number] Yes Apply topically as directed. Apply to scaly rash on face hs on Tuesday, Thursday, and Saturday [name], MD  Active   ketoconazole (NIZORAL) 2 % cream [id number] Yes Apply 1 application topically as directed. Apply to scaly rash on face Monday, Wednesday, and Friday at bedtime [name], MD  Active   lidocaine (LIDODERM) 5 % [id number] No Place 2 patches onto the skin daily. Remove & Discard patch within 12 hours or as directed by MD Donita Brooks, NP Taking Active   Melatonin 5 MG TABS [id number] No Take 10 mg by mouth at bedtime.  [provider] Taking Active Self  metoprolol tartrate (LOPRESSOR) 25 MG tablet [id number] No TAKE 1 TABLET BY MOUTH TWICE A DAY [name], MD Taking Active   mometasone (ELOCON) 0.1 % cream [id number] No APPLY TO AFFECTED AREA TWICE A DAY [name], MD Taking  Active   Multiple Vitamin (MULTIVITAMIN WITH MINERALS) TABS tablet [id number] No Take 1 tablet by mouth in the morning and at bedtime.  [provider] Taking Active Self  pantoprazole (PROTONIX) 40 MG tablet [id number] No Take 1 tablet (40 mg total) by mouth 2 (two) times daily. [name], MD Taking Active Self  pramipexole (MIRAPEX) 0.25 MG tablet [id number] No TAKE 2 TABLETS BY MOUTH AT BEDTIME [name], MD Taking Active   tadalafil (CIALIS) 5 MG tablet [id number] No Take 1 tablet (5 mg total) by mouth daily. [name], MD Taking Active   terbinafine (LAMISIL) 250 MG tablet [id number] Yes Take 1 tablet (250 mg total) by mouth daily. [name], MD  Active   tiZANidine (ZANAFLEX) 4 MG tablet [id number] No Take 1 tablet (4 mg total) by mouth every 8 (eight) hours as needed for muscle spasms. [name], MD Taking Active Self  valACYclovir (VALTREX) 1000 MG tablet [id number] No Take 1,000 mg by mouth daily as needed (outbreak). [provider] Taking Active Self          Patient Active Problem List   Diagnosis Date Noted  . Pneumothorax on right 04/07/2020  . Spontaneous pneumothorax 03/28/2020  . COPD (chronic obstructive pulmonary disease) ([place]) 03/28/2020  . Tubular adenoma of colon   . RLS (restless legs syndrome)   . Abnormal cardiac CT angiography 02/27/2012  . Chronic cough 02/27/2012  . Hypertension 02/26/2012  . Hyperlipidemia 02/26/2012  . GERD (gastroesophageal reflux disease)   . CAD (coronary artery disease)   . Erectile dysfunction   . Mitral valve regurgitation     Immunization History  Administered Date(s) Administered  . Fluad Quad(high Dose 65+) 07/23/2019  . Influenza, High Dose Seasonal PF 07/24/2018  . PFIZER(Purple Top)SARS-COV-2 Vaccination 10/27/2019, 11/17/2019  . Pneumococcal Conjugate-13 01/16/2014  . Pneumococcal Polysaccharide-23 06/01/2010, 03/02/2011, 04/01/2011, 02/04/2015, 08/15/2020  . Td  03/31/2009  . Tdap 05/24/2009  . Zoster 07/06/2011  . Zoster Recombinat (Shingrix) 08/15/2020, 10/20/2020    Conditions to be addressed/monitored:  CAD, HTN, GERD, Hyperlipidemia, Erectile dysfunction.  There are no care plans that you recently modified to display for this patient.    Medication Assistance: {MEDASSISTANCEINFO:25044}  Patient's preferred pharmacy is:  [id number]  [place] [address] Phone: [phone] Fax: [address], [place] [address] Phone: [phone] Fax: [phone]  Uses pill box? {Yes or If no, why not?:20788} Pt endorses ***% compliance  We discussed: {Pharmacy options:24294} Patient decided to: {US Pharmacy Plan:23885}  Care Plan and Follow Up Patient Decision:  {FOLLOWUP:24991}  Plan: {CM FOLLOW UP PLAN:25073}  *** Current Barriers:  . {pharmacybarriers:24917} . ***  Pharmacist Clinical Goal(s):  Marland Kitchen Over the next *** days, patient will {PHARMACYGOALCHOICES:24921} through collaboration with PharmD and provider.  . ***  Interventions: . 1:1 collaboration with [name], MD regarding development and update of comprehensive plan of care as evidenced by provider attestation and co-signature . Inter-disciplinary care team collaboration (see longitudinal plan of care) . Comprehensive medication review performed; medication list updated in electronic medical record  Hypertension (BP goal {CHL HP UPSTREAM Pharmacist BP ranges:[phone]}) -{CHL Controlled/Uncontrolled:[phone]} -Current treatment: . *** -Medications previously tried: ***  -Current home readings: *** -Current dietary habits: *** -Current exercise habits: *** -{ACTIONS;DENIES/REPORTS:[id number]::"Denies"} hypotensive/hypertensive symptoms -Educated on {CCM BP Counseling:25124} -Counseled to monitor BP at  home ***, document, and provide log at future appointments -{CCMPHARMDINTERVENTION:25122}  Hyperlipidemia: (LDL goal < ***) -{CHL Controlled/Uncontrolled:[phone]} -Current treatment: . *** -Medications previously tried: ***  -Current dietary patterns: *** -Current exercise habits: *** -Educated on {CCM HLD Counseling:25126} -{CCMPHARMDINTERVENTION:25122}  GERD (Goal: ***) -{CHL Controlled/Uncontrolled:[phone]} -Current treatment  . *** -Medications previously tried: ***  -{CCMPHARMDINTERVENTION:25122}   Patient Goals/Self-Care Activities . Over the next *** days, patient will:  - {pharmacypatientgoals:24919}  Follow Up Plan: {CM FOLLOW UP ACQP:84835}

## 2020-11-18 ENCOUNTER — Telehealth: Payer: Self-pay

## 2020-11-21 ENCOUNTER — Encounter: Payer: Self-pay | Admitting: Cardiovascular Disease

## 2020-11-21 ENCOUNTER — Other Ambulatory Visit: Payer: Self-pay

## 2020-11-21 ENCOUNTER — Ambulatory Visit (INDEPENDENT_AMBULATORY_CARE_PROVIDER_SITE_OTHER): Payer: Medicare Other | Admitting: Cardiovascular Disease

## 2020-11-21 VITALS — BP 130/73 | HR 54 | Ht 75.0 in | Wt 256.3 lb

## 2020-11-21 DIAGNOSIS — I2510 Atherosclerotic heart disease of native coronary artery without angina pectoris: Secondary | ICD-10-CM

## 2020-11-21 DIAGNOSIS — G4733 Obstructive sleep apnea (adult) (pediatric): Secondary | ICD-10-CM | POA: Diagnosis not present

## 2020-11-21 DIAGNOSIS — K219 Gastro-esophageal reflux disease without esophagitis: Secondary | ICD-10-CM | POA: Diagnosis not present

## 2020-11-21 DIAGNOSIS — R600 Localized edema: Secondary | ICD-10-CM

## 2020-11-21 DIAGNOSIS — E785 Hyperlipidemia, unspecified: Secondary | ICD-10-CM | POA: Diagnosis not present

## 2020-11-21 DIAGNOSIS — I10 Essential (primary) hypertension: Secondary | ICD-10-CM | POA: Diagnosis not present

## 2020-11-21 NOTE — Progress Notes (Signed)
Primary MD: Dr. Jenna Luo  PATIENT PROFILE: Gregory Suarez is a 75 y.o. male who presents for an 18 month follow-up evaluaiton.  HPI:  Gregory Suarez is a former patient of Dr. Joni Suarez.  He underwent heart catheterization over 20 ago and was told of having mild blockage which medical therapy was recommended. He has a long history of sleep apnea and initially was followed by Dr. Danton Suarez.  He on his third CPAP machine.  He has a istory of significant obesity when I last saw him in 2017 he had lost over 50 pounds   He denied any exertional chest pain but admits to intermittent left sharp, achy discomfort which is nonexertional.  He denied associated palpitations or shortness of breath.  He admitted to leg swelling and wore an ankle brace..  When I last saw him, he was stable from a cardiac standpoint.  An echo Doppler study in December 2017 showed an EF of 55 to 60%.  Over the past several years, he had done well with reference to chest pain or palpitations.  I  saw him in February 2020. He had undergone a recent chest CT which showed emphysema, aortic atherosclerosis, as well as extensive coronary calcifications. He has not been using his CPAP therapy and I obtained a download  in the office which showed only 2 days of use over the last 90 days. He also has a history of restless leg syndrome. He typically goes to bed between 9 and 11 PM and wakes up between 7 and 9 AM. He had see Dr. Dennard Suarez and because of his abnormal CT scan was sent to me in February for follow-up evaluation.  At that time, in light of his significant coronary calcification I recommended that he undergo a coronary CT angiogram.  This was significantly abnormal and revealed a markedly elevated calcium score of 2264 with diffuse coronary calcification and stenoses in the proximal LAD, diagonal, circumflex marginal, and RCA.   As a result he was referred for cardiac catheterization which I performed on Feb 13 2019.  He was  found to have diffuse multivessel coronary calcification with 50 to 60% proximal LAD stenoses, diffuse 50% stenosis in the first diagonal branch of the LAD with mild irregularity in the mid distal LAD without high-grade stenosis.  Left circumflex vessel had smooth 60% mid distal OM stenosis in the RCA had mild irregularity with proximal to mid calcification and a dominant RCA with stenosis of 20%.  He had preserved LV contractility with an EF at 55%. Medical therapy was recommended.  He had been started on low-dose beta-blocker therapy prior to the catheterization and subsequent to the procedure I recommended initiation of amlodipine 5 mg and further titration of atorvastatin to 80 mg.  I last saw him in August 2020.  At that time he continued to feel well and denied any recurrent chest pain, palpitations or shortness of breath.  His heart rate was running in the mid 50s.  He had noticed leg edema.  He has been 100% compliant with BiPAP therapy.  A download was obtained from July 6 through May 05, 2019.  This shows 100% use and he is averaging 8 hours and 56 minutes of BiPAP therapy per night.  At an 18/14 pressure, AHI is excellent at 1.6.  There is no leak.  He continues to be on atorvastatin 80 mg.  LDL cholesterol on April 22, 2019 was excellent at 44 with total cholesterol 102 HDL 43 triglyceride  76.    Since his last evaluation with me he was hospitalized in June 2021 with a collapsed lung.  He ultimately underwent talc pleurodesis during his hospitalization.  He has been seen by Gregory Deforest, PA and floor his lower extremity edema he was given several days of Lasix with benefit.  Presently, he feels well.  He has arthritic issues involving his knees and feet with some discomfort with walking.  He is unaware of palpitations.  He denies recurrent anginal symptomatology.  He has continued to be on amlodipine 2.5 mg, furosemide 20 mg, metoprolol tartrate 25 mg twice a day for blood pressure.  He is on  atorvastatin 80 mg for hyperlipidemia.  Recent laboratory on November 14, 2020 showed an LDL cholesterol of 53.  He continues to use BiPAP.  I obtained a new download from January 20 through November 18, 2020 which confirms excellent compliance with average use at 9 hours and 40 minutes.  At an 18/14 pressure AHI is excellent at 1.0.  He presents for evaluation.  Past Medical History:  Diagnosis Date  . Basal cell carcinoma 06/28/2015   left temple  . Basal cell carcinoma 08/05/2017   right cheek inf to zygoma  . Basal cell carcinoma 05/09/2020   right prox mandible  . CAD (coronary artery disease)   . Elevated PSA   . Erectile dysfunction   . GERD (gastroesophageal reflux disease)   . Hyperlipidemia   . Hypertension   . Mitral valve regurgitation   . OSA treated with BiPAP   . RLS (restless legs syndrome)   . 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 Tubular adenoma of colon     Past Surgical History:  Procedure Laterality Date  . ANKLE SURGERY    . CHOLECYSTECTOMY    . CHOLECYSTECTOMY    . LEFT HEART CATH AND CORONARY ANGIOGRAPHY N/A 02/13/2019   Procedure: LEFT HEART CATH AND CORONARY ANGIOGRAPHY;  Surgeon: Gregory Sine, MD;  Location: [facility];  Service: Cardiovascular;  Laterality: N/A;    No Known Allergies  Current Outpatient Medications  Medication Sig Dispense Refill  . amLODipine (NORVASC) 2.5 MG tablet TAKE 1 TABLET BY MOUTH EVERY DAY 90 tablet 3  . aspirin EC 81 MG tablet Take 1 tablet (81 mg total) by mouth daily. 90 tablet 3  . atorvastatin (LIPITOR) 80 MG tablet TAKE 1 TABLET BY MOUTH EVERY DAY (Patient taking differently: Take 80 mg by mouth every evening.) 90 tablet 3  . benzonatate (TESSALON) 100 MG capsule Take 100 mg by mouth 3 (three) times daily as needed for cough.    . chlorpheniramine (CHLOR-TRIMETON) 4 MG tablet Take 8 mg by mouth at bedtime.     . finasteride (PROSCAR) 5 MG tablet Take 1 tablet (5 mg total) by mouth  daily. 90 tablet 3  . fluticasone (FLONASE) 50 MCG/ACT nasal spray Place 2 sprays into both nostrils daily as needed for allergies or rhinitis.    . furosemide (LASIX) 20 MG tablet TAKE 1 TABLET BY MOUTH EVERY DAY 90 tablet 0  . hydrochlorothiazide (MICROZIDE) 12.5 MG capsule Take 12.5 mg by mouth daily as needed (fluid/swelling).    Marland Kitchen HYDROcodone-homatropine (HYCODAN) 5-1.5 MG/5ML syrup Take 5 mLs by mouth every 8 (eight) hours as needed for cough. 120 mL 0  . hydrocortisone 2.5 % cream Apply topically as directed. Apply to scaly rash on face hs on Tuesday, Thursday, and Saturday 30 g 3  . ketoconazole (NIZORAL)  2 % cream Apply 1 application topically as directed. Apply to scaly rash on face Monday, Wednesday, and Friday at bedtime 60 g 3  . lidocaine (LIDODERM) 5 % Place 2 patches onto the skin daily. Remove & Discard patch within 12 hours or as directed by MD 30 patch 0  . Melatonin 5 MG TABS Take 10 mg by mouth at bedtime.     . metoprolol tartrate (LOPRESSOR) 25 MG tablet TAKE 1 TABLET BY MOUTH TWICE A DAY 180 tablet 2  . mometasone (ELOCON) 0.1 % cream APPLY TO AFFECTED AREA TWICE A DAY 45 g 0  . Multiple Vitamin (MULTIVITAMIN WITH MINERALS) TABS tablet Take 1 tablet by mouth in the morning and at bedtime.     . pantoprazole (PROTONIX) 40 MG tablet Take 1 tablet (40 mg total) by mouth 2 (two) times daily. 180 tablet 3  . pramipexole (MIRAPEX) 0.25 MG tablet TAKE 2 TABLETS BY MOUTH AT BEDTIME 180 tablet 1  . tadalafil (CIALIS) 5 MG tablet Take 1 tablet (5 mg total) by mouth daily. 90 tablet 2  . terbinafine (LAMISIL) 250 MG tablet Take 1 tablet (250 mg total) by mouth daily. 30 tablet 0  . tiZANidine (ZANAFLEX) 4 MG tablet Take 1 tablet (4 mg total) by mouth every 8 (eight) hours as needed for muscle spasms. 30 tablet 1  . valACYclovir (VALTREX) 1000 MG tablet Take 1,000 mg by mouth daily as needed (outbreak).     No current facility-administered medications for this visit.    Social  History   Socioeconomic History  . Marital status: Married    Spouse name: Not on file  . Number of children: Not on file  . Years of education: Not on file  . Highest education level: Not on file  Occupational History  . Occupation: RETIRED  Tobacco Use  . Smoking status: Former Smoker    Packs/day: 0.50    Years: 15.00    Pack years: 7.50    Types: Cigarettes    Quit date: 10/01/1988    Years since quitting: 32.1  . Smokeless tobacco: Never Used  Substance and Sexual Activity  . Alcohol use: No    Alcohol/week: 0.0 standard drinks  . Drug use: No  . Sexual activity: Yes    Comment: married, 2 grown sons, retired maintenance man  Other Topics Concern  . Not on file  Social History Narrative  . Not on file   Social Determinants of Health   Financial Resource Strain: Low Risk   . Difficulty of Paying Living Expenses: Not very hard  Food Insecurity: Not on file  Transportation Needs: Not on file  Physical Activity: Not on file  Stress: Not on file  Social Connections: Not on file  Intimate Partner Violence: Not on file   Additional social history is notable that he is married for 44 years.  He has 2 children and 4 grandchildren.  He previously worked as a Conservation officer, nature gentleman.  He remotely smoked but quit in 1990.  He is not routinely exercise.  Family History  Problem Relation Age of Onset  . Diabetes Other   . Breast cancer Other   . Coronary artery disease Other    Family history is notable that his mother died at 64 with cancer.  Father had heart disease and diabetes mellitus.  He has 3 brothers, one had heart valve replacement in 2017 and another had undergone CABG revascularization surgery.  He has 2 living sisters and one sister  died in an accident.  ROS General: Negative; No fevers, chills, or night sweats HEENT: Negative; No changes in vision or hearing, sinus congestion, difficulty swallowing Pulmonary: Negative; No cough, wheezing,  shortness of breath, hemoptysis Cardiovascular:  See HPI; No chest pain, presyncope, syncope, palpitations, edema GI: Negative; No nausea, vomiting, diarrhea, or abdominal pain GU: Negative; No dysuria, hematuria, or difficulty voiding Musculoskeletal: Negative; no myalgias, joint pain, or weakness Hematologic/Oncologic: Negative; no easy bruising, bleeding Endocrine: Negative; no heat/cold intolerance; no diabetes Neuro: Negative; no changes in balance, headaches Skin: Negative; No rashes or skin lesions Psychiatric: Negative; No behavioral problems, depression Sleep: Positive for obstructive sleep apnea on his third BiPAP machine.  No daytime sleepiness, hypersomnolence, bruxism, restless legs, hypnogagnic hallucinations Other comprehensive 14 point system review is negative   Physical Exam BP 130/73   Pulse (!) 54   Ht _0  (1.905 m)   Wt 256 lb 4.8 oz (116.3 kg)   SpO2 98%   BMI 32.04 kg/m    Repeat blood pressure by me 124/68  Wt Readings from Last 3 Encounters:  11/21/20 256 lb 4.8 oz (116.3 kg)  11/08/20 255 lb (115.7 kg)  05/18/20 246 lb 6.4 oz (111.8 kg)   General: Alert, oriented, no distress.  Skin: normal turgor, no rashes, warm and dry HEENT: Normocephalic, atraumatic. Pupils equal round and reactive to light; sclera anicteric; extraocular muscles intact;  Nose without nasal septal hypertrophy Mouth/Parynx benign; Mallinpatti scale 3 Neck: No JVD, no carotid bruits; normal carotid upstroke Lungs: clear to ausculatation and percussion; no wheezing or rales Chest wall: without tenderness to palpitation Heart: PMI not displaced, RRR, s1 s2 normal, 1/6 systolic murmur, no diastolic murmur, no rubs, gallops, thrills, or heaves Abdomen: soft, nontender; no hepatosplenomehaly, BS+; abdominal aorta nontender and not dilated by palpation. Back: no CVA tenderness Pulses 2+ Musculoskeletal: full range of motion, normal strength, no joint deformities Extremities: no  clubbing cyanosis or edema, Homan's sign negative  Neurologic: grossly nonfocal; Cranial nerves grossly wnl Psychologic: Normal mood and affect  ECG (independently read by me): Sinus bradycardia at 54; LAHB, LVH, no ectopy  August 2020 ECG (independently read by me): Sinus bradycardia 53 bpm.  LVH by voltage criteria.  Left anterior hemiblock.  Normal intervals.  October 2017 ECG (independently read by me): Marked sinus bradycardia at 42 bpm.  No ectopy.  LABS:  BMP Latest Ref Rng & Units 11/14/2020 04/13/2020 04/09/2020  Glucose 65 - 99 mg/dL 80 133(H) 93  BUN 8 - 27 mg/dL _1 Creatinine 0.76 - 1.27 mg/dL 0.96 0.89 1.06  BUN/Creat Ratio 10 - 24 13 - -  Sodium 134 - 144 mmol/L 142 141 139  Potassium 3.5 - 5.2 mmol/L 4.3 4.0 4.2  Chloride 96 - 106 mmol/L 103 103 104  CO2 20 - 29 mmol/L _2 Calcium 8.6 - 10.2 mg/dL 8.9 9.1 8.6(L)     Hepatic Function Latest Ref Rng & Units 11/14/2020 03/29/2020 12/08/2019  Total Protein 6.0 - 8.5 g/dL 6.6 7.1 6.6  Albumin 3.7 - 4.7 g/dL 4.2 3.8 -  AST 0 - 40 IU/L _3 ALT 0 - 44 IU/L _4 Alk Phosphatase 44 - 121 IU/L 107 98 -  Total Bilirubin 0.0 - 1.2 mg/dL 0.5 1.0 0.8    CBC Latest Ref Rng & Units 11/14/2020 04/08/2020 04/07/2020  WBC 3.4 - 10.8 x10E3/uL 8.4 9.7 10.4  Hemoglobin 13.0 - 17.7 g/dL 14.7 14.1 16.4  Hematocrit 37.5 -  51.0 % 43.6 43.7 49.6  Platelets 150 - 450 x10E3/uL 180 184 230   Lab Results  Component Value Date   MCV 91 11/14/2020   MCV 94.8 04/08/2020   MCV 95.2 04/07/2020   Lab Results  Component Value Date   TSH 3.080 11/14/2020   No results found for: HGBA1C   BNP No results found for: BNP  ProBNP No results found for: PROBNP   Lipid Panel     Component Value Date/Time   CHOL 109 11/14/2020 0849   TRIG 83 11/14/2020 0849   HDL 39 (L) 11/14/2020 0849   CHOLHDL 2.8 11/14/2020 0849   CHOLHDL 2.6 12/08/2019 0801   VLDL 18 02/07/2017 0809   LDLCALC 53 11/14/2020 0849   LDLCALC 53  12/08/2019 0801    RADIOLOGY: No results found.    IMPRESSION: 1. Coronary artery disease involving native coronary artery of native heart without angina pectoris   2. Essential hypertension   3. OSA treated with BiPAP   4. Hyperlipidemia LDL goal <70   5. Leg edema   6. Gastroesophageal reflux disease without esophagitis     ASSESSMENT AND PLAN: 1.  Coronary calcification/CAD: Calcium score 2264 with diffuse coronary calcification and stenosis in the proximal LAD, diagonal, circumflex marginal and RCA.  Definitive cardiac catheterization was performed on Feb 13, 2019 which reveale diffuse multivessel coronary calcification of 50 to 60% in the proximal LAD, diffuse 50% stenosis in the first diagonal branch of the LAD and irregularity of the mid distal LAD without significant stenosis.  The left circumflex vessel had 60% mid distal OM stenosis proximal to the bifurcation and there was mild irregularity with proximal to mid calcification and a dominant RCA.  Presently, he is not having any anginal symptomatology on his current regimen of amlodipine 2.5 mg, metoprolol tartrate 25 mg twice a day in addition to high potency statin therapy with atorvastatin 80 mg.  2.  Essential hypertension.  Blood pressure is controlled on amlodipine 2.5 mg, HCTZ 12.5 mg, and metoprolol tartrate 25 mg twice daily.  There is resolution of prior lower extremity edema.  3.  Leg edema: Resolved with HCTZ  4.  OSA on BiPAP: Set up date May 13, 2018 with the ResMed AirCurve 10 VAuto unit. I obtained a download from October 20, 2020 through November 18, 2020 which continues to demonstrate excellent compliance with average use at 9 hours 40 minutes.  At a BiPAP pressure of 18/14, AHI is excellent at 1.0.  There is no mask leak.  5. Hyperlipidemia: On atorvastatin 80 mg.  Most recent laboratory from Feb 11, 2021 total cholesterol 109, LDL 53 triglycerides 83 HDL 39.  6. GERD: Controlled on pantoprazole.  7.  Posttraumatic stress disorder: Diagnosed at the [facility] hospital.  Clinically he is doing well.  I will see him in 1 year for follow-up evaluation or sooner as needed.  Gregory Sine, MD, [facility] 11/27/2020 10:11 AM

## 2020-11-21 NOTE — Patient Instructions (Addendum)
Medication Instructions:  Your physician recommends that you continue on your current medications as directed. Please refer to the Current Medication list given to you today.   *If you need a refill on your cardiac medications before your next appointment, please call your pharmacy*  Lab Work: NONE  Testing/Procedures: NONE  Follow-Up: At [facility], you and your health needs are our priority.  As part of our continuing mission to provide you with exceptional heart care, we have created designated Provider Care Teams.  These Care Teams include your primary Cardiologist (physician) and Advanced Practice Providers (APPs -  Physician Assistants and Nurse Practitioners) who all work together to provide you with the care you need, when you need it.  We recommend signing up for the patient portal called "MyChart".  Sign up information is provided on this After Visit Summary.  MyChart is used to connect with patients for Virtual Visits (Telemedicine).  Patients are able to view lab/test results, encounter notes, upcoming appointments, etc.  Non-urgent messages can be sent to your provider as well.   To learn more about what you can do with MyChart, go to [email or web address].    Your next appointment:   12 month(s)  The format for your next appointment:   In Person  Provider:   You may see Shelva Majestic, MD or one of the following Advanced Practice Providers on your designated Care Team:    Almyra Deforest, PA-C  Fabian Sharp, PA-C or   Roby Lofts, [place]

## 2020-11-25 ENCOUNTER — Telehealth: Payer: Self-pay | Admitting: Pharmacist

## 2020-11-25 NOTE — Progress Notes (Signed)
    Chronic Care Management Pharmacy Assistant   Name: Gregory Suarez  MRN: [id number] DOB: 1946/06/16  Reason for Encounter: Adherence Review  PCP : [name], MD  Verified Adherence Gap Information.Per insurance data patient has met their wellness bundle and annul wellness visit screening.Their most recent blood pressure was 132/72 on 05/18/20, patient met their blood pressure goal by keeping their blood pressure under 140/90. The patient is current with their colorectal cancer screening as well.   Follow-Up:  Pharmacist Review   Charlann Lange, [place] Pharmacist Assistant [phone]

## 2020-11-27 ENCOUNTER — Encounter: Payer: Self-pay | Admitting: Cardiovascular Disease

## 2020-12-08 ENCOUNTER — Other Ambulatory Visit: Payer: Self-pay

## 2020-12-08 ENCOUNTER — Other Ambulatory Visit: Payer: Medicare Other

## 2020-12-08 DIAGNOSIS — I10 Essential (primary) hypertension: Secondary | ICD-10-CM | POA: Diagnosis not present

## 2020-12-08 DIAGNOSIS — Z1159 Encounter for screening for other viral diseases: Secondary | ICD-10-CM | POA: Diagnosis not present

## 2020-12-08 DIAGNOSIS — Z125 Encounter for screening for malignant neoplasm of prostate: Secondary | ICD-10-CM

## 2020-12-08 DIAGNOSIS — I2510 Atherosclerotic heart disease of native coronary artery without angina pectoris: Secondary | ICD-10-CM

## 2020-12-08 DIAGNOSIS — E785 Hyperlipidemia, unspecified: Secondary | ICD-10-CM | POA: Diagnosis not present

## 2020-12-09 LAB — CBC WITH DIFFERENTIAL/PLATELET
Absolute Monocytes: 540 cells/uL (ref 200–950)
Basophils Absolute: 28 cells/uL (ref 0–200)
Basophils Relative: 0.4 %
Eosinophils Absolute: 192 cells/uL (ref 15–500)
Eosinophils Relative: 2.7 %
HCT: 43.8 % (ref 38.5–50.0)
Hemoglobin: 14.3 g/dL (ref 13.2–17.1)
Lymphs Abs: 866 cells/uL (ref 850–3900)
MCH: 30.3 pg (ref 27.0–33.0)
MCHC: 32.6 g/dL (ref 32.0–36.0)
MCV: 92.8 fL (ref 80.0–100.0)
MPV: 9.9 fL (ref 7.5–12.5)
Monocytes Relative: 7.6 %
Neutro Abs: 5474 cells/uL (ref 1500–7800)
Neutrophils Relative %: 77.1 %
Platelets: 188 10*3/uL (ref 140–400)
RBC: 4.72 10*6/uL (ref 4.20–5.80)
RDW: 13.7 % (ref 11.0–15.0)
Total Lymphocyte: 12.2 %
WBC: 7.1 10*3/uL (ref 3.8–10.8)

## 2020-12-09 LAB — COMPLETE METABOLIC PANEL WITH GFR
AG Ratio: 1.7 (calc) (ref 1.0–2.5)
ALT: 8 U/L — ABNORMAL LOW (ref 9–46)
AST: 14 U/L (ref 10–35)
Albumin: 3.9 g/dL (ref 3.6–5.1)
Alkaline phosphatase (APISO): 81 U/L (ref 35–144)
BUN: 13 mg/dL (ref 7–25)
CO2: 23 mmol/L (ref 20–32)
Calcium: 8.5 mg/dL — ABNORMAL LOW (ref 8.6–10.3)
Chloride: 109 mmol/L (ref 98–110)
Creat: 0.91 mg/dL (ref 0.70–1.18)
GFR, Est African American: 96 mL/min/{1.73_m2} (ref >=60)
GFR, Est Non African American: 83 mL/min/{1.73_m2} (ref >=60)
Globulin: 2.3 g/dL (calc) (ref 1.9–3.7)
Glucose, Bld: 86 mg/dL (ref 65–99)
Potassium: 4.3 mmol/L (ref 3.5–5.3)
Sodium: 142 mmol/L (ref 135–146)
Total Bilirubin: 0.8 mg/dL (ref 0.2–1.2)
Total Protein: 6.2 g/dL (ref 6.1–8.1)

## 2020-12-09 LAB — HEPATITIS C ANTIBODY
Hepatitis C Ab: NONREACTIVE
SIGNAL TO CUT-OFF: 0.01 (ref <1.00)

## 2020-12-09 LAB — LIPID PANEL
Cholesterol: 87 mg/dL (ref <200)
HDL: 36 mg/dL — ABNORMAL LOW (ref >=40)
LDL Cholesterol (Calc): 35 mg/dL (calc)
Non-HDL Cholesterol (Calc): 51 mg/dL (calc) (ref <130)
Total CHOL/HDL Ratio: 2.4 (calc) (ref <5.0)
Triglycerides: 76 mg/dL (ref <150)

## 2020-12-09 LAB — PSA: PSA: 0.36 ng/mL (ref <=4.0)

## 2020-12-14 ENCOUNTER — Ambulatory Visit: Payer: Medicare Other | Admitting: Dermatology

## 2020-12-14 ENCOUNTER — Other Ambulatory Visit: Payer: Self-pay

## 2020-12-14 ENCOUNTER — Encounter: Payer: Self-pay | Admitting: Family Medicine

## 2020-12-14 ENCOUNTER — Ambulatory Visit (INDEPENDENT_AMBULATORY_CARE_PROVIDER_SITE_OTHER): Payer: Medicare Other | Admitting: Family Medicine

## 2020-12-14 VITALS — BP 102/74 | HR 50 | Temp 97.9°F | Resp 14 | Ht 75.0 in | Wt 256.0 lb

## 2020-12-14 DIAGNOSIS — J9383 Other pneumothorax: Secondary | ICD-10-CM | POA: Diagnosis not present

## 2020-12-14 DIAGNOSIS — J431 Panlobular emphysema: Secondary | ICD-10-CM

## 2020-12-14 DIAGNOSIS — G8929 Other chronic pain: Secondary | ICD-10-CM

## 2020-12-14 DIAGNOSIS — D126 Benign neoplasm of colon, unspecified: Secondary | ICD-10-CM | POA: Diagnosis not present

## 2020-12-14 DIAGNOSIS — Z0001 Encounter for general adult medical examination with abnormal findings: Secondary | ICD-10-CM

## 2020-12-14 DIAGNOSIS — Z136 Encounter for screening for cardiovascular disorders: Secondary | ICD-10-CM

## 2020-12-14 DIAGNOSIS — M25562 Pain in left knee: Secondary | ICD-10-CM | POA: Diagnosis not present

## 2020-12-14 DIAGNOSIS — E785 Hyperlipidemia, unspecified: Secondary | ICD-10-CM

## 2020-12-14 DIAGNOSIS — I2510 Atherosclerotic heart disease of native coronary artery without angina pectoris: Secondary | ICD-10-CM | POA: Diagnosis not present

## 2020-12-14 DIAGNOSIS — I10 Essential (primary) hypertension: Secondary | ICD-10-CM

## 2020-12-14 DIAGNOSIS — Z0000 Encounter for general adult medical examination without abnormal findings: Secondary | ICD-10-CM

## 2020-12-14 NOTE — Progress Notes (Signed)
Subjective:    Patient ID: Gregory Suarez, male    DOB: [birth year], 75 y.o.   MRN: [id number]  HPI   Patient is a very pleasant 75 year old WM here today for complete physical exam.  He is under the care of a psychiatrist through the [facility] for PTSD.  His last colonoscopy was performed in 2018 and was significant for polyps.  His gastroenterologist recommended a repeat colonoscopy in 3 to 5 years.  Last year, he presented to [facility] hospital with a pneumothorax and was managed with a chest tube.  Chest tube was successfully removed following resolution of his pneumothorax but did have a recurrence of his PTX.  He presented back to the hospital and was admitted, was managed conservatively with a chest tube in place for about 7 days and with recurrence of his pneumothorax ultimately had talc pleurodesis.  He denies any falls, depression, or memory loss.  He has a history of coronary artery disease.  Please see his cath report from 2020.  Therefore his goal LDL cholesterol is less than 70 which is.  He has not had AAA screening.  Immunization records are listed below.  He is due for a tetanus shot.  He declines a tetanus shot today.   Immunization History  Administered Date(s) Administered  . Fluad Quad(high Dose 65+) 07/23/2019  . Influenza, High Dose Seasonal PF 07/24/2018  . PFIZER(Purple Top)SARS-COV-2 Vaccination 10/27/2019, 11/17/2019, 07/26/2020  . Pneumococcal Conjugate-13 01/16/2014  . Pneumococcal Polysaccharide-23 06/01/2010, 03/02/2011, 04/01/2011, 02/04/2015, 08/15/2020  . Td 03/31/2009  . Tdap 05/24/2009, 10/01/2010  . Zoster 07/06/2011, 03/01/2014  . Zoster Recombinat (Shingrix) 08/15/2020, 10/20/2020    Lab on 12/08/2020  Component Date Value Ref Range Status  . WBC 12/08/2020 7.1  3.8 - 10.8 Thousand/uL Final  . RBC 12/08/2020 4.72  4.20 - 5.80 Million/uL Final  . Hemoglobin 12/08/2020 14.3  13.2 - 17.1 g/dL Final  . HCT 12/08/2020 43.8  38.5 - 50.0 % Final  .  MCV 12/08/2020 92.8  80.0 - 100.0 fL Final  . MCH 12/08/2020 30.3  27.0 - 33.0 pg Final  . MCHC 12/08/2020 32.6  32.0 - 36.0 g/dL Final  . RDW 12/08/2020 13.7  11.0 - 15.0 % Final  . Platelets 12/08/2020 188  140 - 400 Thousand/uL Final  . MPV 12/08/2020 9.9  7.5 - 12.5 fL Final  . Neutro Abs 12/08/2020 5,474  1,500 - 7,800 cells/uL Final  . Lymphs Abs 12/08/2020 866  850 - 3,900 cells/uL Final  . Absolute Monocytes 12/08/2020 540  200 - 950 cells/uL Final  . Eosinophils Absolute 12/08/2020 192  15 - 500 cells/uL Final  . Basophils Absolute 12/08/2020 28  0 - 200 cells/uL Final  . Neutrophils Relative % 12/08/2020 77.1  % Final  . Total Lymphocyte 12/08/2020 12.2  % Final  . Monocytes Relative 12/08/2020 7.6  % Final  . Eosinophils Relative 12/08/2020 2.7  % Final  . Basophils Relative 12/08/2020 0.4  % Final  . Glucose, Bld 12/08/2020 86  65 - 99 mg/dL Final   Comment: .            Fasting reference interval .   . BUN 12/08/2020 13  7 - 25 mg/dL Final  . Creat 12/08/2020 0.91  0.70 - 1.18 mg/dL Final   Comment: For patients >15 years of age, the reference limit for Creatinine is approximately 13% higher for people identified as African-American. .   . GFR, Est Non African American 12/08/2020 83  >  OR = 60 mL/min/1.8m2 Final  . GFR, Est African American 12/08/2020 96  > OR = 60 mL/min/1.63m2 Final  . BUN/Creatinine Ratio 56/21/3086 NOT APPLICABLE  6 - 22 (calc) Final  . Sodium 12/08/2020 142  135 - 146 mmol/L Final  . Potassium 12/08/2020 4.3  3.5 - 5.3 mmol/L Final  . Chloride 12/08/2020 109  98 - 110 mmol/L Final  . CO2 12/08/2020 23  20 - 32 mmol/L Final  . Calcium 12/08/2020 8.5* 8.6 - 10.3 mg/dL Final  . Total Protein 12/08/2020 6.2  6.1 - 8.1 g/dL Final  . Albumin 12/08/2020 3.9  3.6 - 5.1 g/dL Final  . Globulin 12/08/2020 2.3  1.9 - 3.7 g/dL (calc) Final  . AG Ratio 12/08/2020 1.7  1.0 - 2.5 (calc) Final  . Total Bilirubin 12/08/2020 0.8  0.2 - 1.2 mg/dL Final  .  Alkaline phosphatase (APISO) 12/08/2020 81  35 - 144 U/L Final  . AST 12/08/2020 14  10 - 35 U/L Final  . ALT 12/08/2020 8* 9 - 46 U/L Final  . Hepatitis C Ab 12/08/2020 NON-REACTIVE  NON-REACTI Final  . SIGNAL TO CUT-OFF 12/08/2020 0.01  <1.00 Final   Comment: . HCV antibody was non-reactive. There is no laboratory  evidence of HCV infection. . In most cases, no further action is required. However, if recent HCV exposure is suspected, a test for HCV RNA (test code [phone]) is suggested. . For additional information please refer to http://education.[facility].com/faq/FAQ22v1 (This link is being provided for informational/ educational purposes only.) .   Marland Kitchen Cholesterol 12/08/2020 87  <200 mg/dL Final  . HDL 12/08/2020 36* > OR = 40 mg/dL Final  . Triglycerides 12/08/2020 76  <150 mg/dL Final  . LDL Cholesterol (Calc) 12/08/2020 35  mg/dL (calc) Final   Comment: Reference range: <100 . Desirable range <100 mg/dL for primary prevention;   <70 mg/dL for patients with CHD or diabetic patients  with > or = 2 CHD risk factors. Marland Kitchen LDL-C is now calculated using the Martin-Hopkins  calculation, which is a validated novel method providing  better accuracy than the Friedewald equation in the  estimation of LDL-C.  Cresenciano Genre et al. Annamaria Helling. 9629;528(41): 2061-2068  (http://education.[facility].com/faq/FAQ164)   . Total CHOL/HDL Ratio 12/08/2020 2.4  <5.0 (calc) Final  . Non-HDL Cholesterol (Calc) 12/08/2020 51  <130 mg/dL (calc) Final   Comment: For patients with diabetes plus 1 major ASCVD risk  factor, treating to a non-HDL-C goal of <100 mg/dL  (LDL-C of <70 mg/dL) is considered a therapeutic  option.   Marland Kitchen PSA 12/08/2020 0.36  < OR = 4.0 ng/mL Final   Comment: The total PSA value from this assay system is  standardized against the WHO standard. The test  result will be approximately 20% lower when compared  to the equimolar-standardized total PSA (Beckman  Coulter).  Comparison of serial PSA results should be  interpreted with this fact in mind. . This test was performed using the Siemens  chemiluminescent method. Values obtained from  different assay methods cannot be used interchangeably. PSA levels, regardless of value, should not be interpreted as absolute evidence of the presence or absence of disease.     Past Medical History:  Diagnosis Date  . Basal cell carcinoma 06/28/2015   left temple  . Basal cell carcinoma 08/05/2017   right cheek inf to zygoma  . Basal cell carcinoma 05/09/2020   right prox mandible  . CAD (coronary artery disease)   . Elevated PSA   .  Erectile dysfunction   . GERD (gastroesophageal reflux disease)   . Hyperlipidemia   . Hypertension   . Mitral valve regurgitation   . OSA treated with BiPAP   . RLS (restless legs syndrome)   . 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 Tubular adenoma of colon    Past Surgical History:  Procedure Laterality Date  . ANKLE SURGERY    . CHOLECYSTECTOMY    . CHOLECYSTECTOMY    . LEFT HEART CATH AND CORONARY ANGIOGRAPHY N/A 02/13/2019   Procedure: LEFT HEART CATH AND CORONARY ANGIOGRAPHY;  Surgeon: Troy Sine, MD;  Location: [facility];  Service: Cardiovascular;  Laterality: N/A;   Current Outpatient Medications on File Prior to Visit  Medication Sig Dispense Refill  . amLODipine (NORVASC) 2.5 MG tablet TAKE 1 TABLET BY MOUTH EVERY DAY 90 tablet 3  . aspirin EC 81 MG tablet Take 1 tablet (81 mg total) by mouth daily. 90 tablet 3  . atorvastatin (LIPITOR) 80 MG tablet TAKE 1 TABLET BY MOUTH EVERY DAY (Patient taking differently: Take 80 mg by mouth every evening.) 90 tablet 3  . benzonatate (TESSALON) 100 MG capsule Take 100 mg by mouth 3 (three) times daily as needed for cough.    . chlorpheniramine (CHLOR-TRIMETON) 4 MG tablet Take 8 mg by mouth at bedtime.     . finasteride (PROSCAR) 5 MG tablet Take 1 tablet (5 mg total) by mouth daily. 90  tablet 3  . fluticasone (FLONASE) 50 MCG/ACT nasal spray Place 2 sprays into both nostrils daily as needed for allergies or rhinitis.    . furosemide (LASIX) 20 MG tablet TAKE 1 TABLET BY MOUTH EVERY DAY 90 tablet 0  . hydrochlorothiazide (MICROZIDE) 12.5 MG capsule Take 12.5 mg by mouth daily as needed (fluid/swelling).    Marland Kitchen HYDROcodone-homatropine (HYCODAN) 5-1.5 MG/5ML syrup Take 5 mLs by mouth every 8 (eight) hours as needed for cough. 120 mL 0  . hydrocortisone 2.5 % cream Apply topically as directed. Apply to scaly rash on face hs on Tuesday, Thursday, and Saturday 30 g 3  . ketoconazole (NIZORAL) 2 % cream Apply 1 application topically as directed. Apply to scaly rash on face Monday, Wednesday, and Friday at bedtime 60 g 3  . lidocaine (LIDODERM) 5 % Place 2 patches onto the skin daily. Remove & Discard patch within 12 hours or as directed by MD 30 patch 0  . Melatonin 5 MG TABS Take 10 mg by mouth at bedtime.     . metoprolol tartrate (LOPRESSOR) 25 MG tablet TAKE 1 TABLET BY MOUTH TWICE A DAY 180 tablet 2  . mometasone (ELOCON) 0.1 % cream APPLY TO AFFECTED AREA TWICE A DAY 45 g 0  . Multiple Vitamin (MULTIVITAMIN WITH MINERALS) TABS tablet Take 1 tablet by mouth in the morning and at bedtime.     . pantoprazole (PROTONIX) 40 MG tablet Take 1 tablet (40 mg total) by mouth 2 (two) times daily. 180 tablet 3  . pramipexole (MIRAPEX) 0.25 MG tablet TAKE 2 TABLETS BY MOUTH AT BEDTIME 180 tablet 1  . tadalafil (CIALIS) 5 MG tablet Take 1 tablet (5 mg total) by mouth daily. 90 tablet 2  . terbinafine (LAMISIL) 250 MG tablet Take 1 tablet (250 mg total) by mouth daily. 30 tablet 0  . tiZANidine (ZANAFLEX) 4 MG tablet Take 1 tablet (4 mg total) by mouth every 8 (eight) hours as needed for muscle spasms. 30 tablet 1  . valACYclovir (  VALTREX) 1000 MG tablet Take 1,000 mg by mouth daily as needed (outbreak).     No current facility-administered medications on file prior to visit.   No Known  Allergies Social History   Socioeconomic History  . Marital status: Married    Spouse name: Not on file  . Number of children: Not on file  . Years of education: Not on file  . Highest education level: Not on file  Occupational History  . Occupation: RETIRED  Tobacco Use  . Smoking status: Former Smoker    Packs/day: 0.50    Years: 15.00    Pack years: 7.50    Types: Cigarettes    Quit date: 10/01/1988    Years since quitting: 32.2  . Smokeless tobacco: Never Used  Substance and Sexual Activity  . Alcohol use: No    Alcohol/week: 0.0 standard drinks  . Drug use: No  . Sexual activity: Yes    Comment: married, 2 grown sons, retired maintenance man  Other Topics Concern  . Not on file  Social History Narrative  . Not on file   Social Determinants of Health   Financial Resource Strain: Low Risk   . Difficulty of Paying Living Expenses: Not very hard  Food Insecurity: Not on file  Transportation Needs: Not on file  Physical Activity: Not on file  Stress: Not on file  Social Connections: Not on file  Intimate Partner Violence: Not on file   Family History  Problem Relation Age of Onset  . Diabetes Other   . Breast cancer Other   . Coronary artery disease Other       Review of Systems  All other systems reviewed and are negative.      Objective:   Physical Exam Vitals reviewed.  Constitutional:      General: He is not in acute distress.    Appearance: He is well-developed. He is not diaphoretic.  HENT:     Head: Normocephalic and atraumatic.     Right Ear: External ear normal.     Left Ear: External ear normal.     Nose: Nose normal.     Mouth/Throat:     Pharynx: No oropharyngeal exudate.  Eyes:     General: No scleral icterus.       Right eye: No discharge.        Left eye: No discharge.     Conjunctiva/sclera: Conjunctivae normal.     Pupils: Pupils are equal, round, and reactive to light.  Neck:     Thyroid: No thyromegaly.     Vascular: No  JVD.     Trachea: No tracheal deviation.  Cardiovascular:     Rate and Rhythm: Normal rate and regular rhythm.     Heart sounds: Normal heart sounds. No murmur heard. No friction rub. No gallop.   Pulmonary:     Effort: Pulmonary effort is normal. No respiratory distress.     Breath sounds: Normal breath sounds. No stridor. No wheezing or rales.  Chest:     Chest wall: No tenderness.  Abdominal:     General: Bowel sounds are normal. There is no distension.     Palpations: Abdomen is soft. There is no mass.     Tenderness: There is no abdominal tenderness. There is no guarding or rebound.  Musculoskeletal:        General: No tenderness. Normal range of motion.     Cervical back: Normal range of motion and neck supple.  Lymphadenopathy:  Cervical: No cervical adenopathy.  Skin:    General: Skin is warm.     Coloration: Skin is not pale.     Findings: No erythema or rash.  Neurological:     Mental Status: He is alert and oriented to person, place, and time.     Cranial Nerves: No cranial nerve deficit.     Motor: No abnormal muscle tone.     Coordination: Coordination normal.     Deep Tendon Reflexes: Reflexes are normal and symmetric.  Psychiatric:        Behavior: Behavior normal.        Thought Content: Thought content normal.        Judgment: Judgment normal.           Assessment & Plan:    Panlobular emphysema (HCC)  Spontaneous pneumothorax  Coronary artery disease involving native coronary artery without angina pectoris, unspecified whether native or transplanted heart  Hyperlipidemia, unspecified hyperlipidemia type  Essential hypertension  Tubular adenoma of colon  General medical exam  Encounter for abdominal aortic aneurysm (AAA) screening - Plan: US AORTA  Chronic pain of left knee - Plan: DG Knee Complete 4 Views Left  Patient's physical exam today is excellent.  His LDL cholesterol is well below his goal of 70.  The remainder of his lab work  is outstanding except for a low HDL.  I recommended a tetanus shot but the patient politely declined due to cost.  Blood pressure is outstanding.  Colonoscopy is due next year.  I will schedule the patient for a AAA screen.  He quit smoking in 1983 and therefore does not require CT scan of the lungs for lung cancer screening.  He does mention pain in his left knee underneath his kneecap.  I recommended that we start with an x-ray of the knee to evaluate further.  The remainder of his preventative care is up-to-date.  PSA is outstanding.

## 2020-12-26 ENCOUNTER — Telehealth: Payer: Self-pay | Admitting: *Deleted

## 2020-12-26 ENCOUNTER — Ambulatory Visit (INDEPENDENT_AMBULATORY_CARE_PROVIDER_SITE_OTHER): Payer: Medicare Other | Admitting: Dermatology

## 2020-12-26 ENCOUNTER — Other Ambulatory Visit: Payer: Self-pay

## 2020-12-26 DIAGNOSIS — L820 Inflamed seborrheic keratosis: Secondary | ICD-10-CM | POA: Diagnosis not present

## 2020-12-26 DIAGNOSIS — I2510 Atherosclerotic heart disease of native coronary artery without angina pectoris: Secondary | ICD-10-CM | POA: Diagnosis not present

## 2020-12-26 DIAGNOSIS — L578 Other skin changes due to chronic exposure to nonionizing radiation: Secondary | ICD-10-CM

## 2020-12-26 DIAGNOSIS — B351 Tinea unguium: Secondary | ICD-10-CM | POA: Diagnosis not present

## 2020-12-26 DIAGNOSIS — B353 Tinea pedis: Secondary | ICD-10-CM | POA: Diagnosis not present

## 2020-12-26 MED ORDER — TERBINAFINE HCL 250 MG PO TABS: 250.0000 mg | ORAL_TABLET | Freq: Every day | ORAL | 1 refills | Status: DC

## 2020-12-26 NOTE — Progress Notes (Unsigned)
   Follow-Up Visit   Subjective  Gregory Suarez is a 75 y.o. male who presents for the following: Follow-up (1 month f/u toenail and feet, treating with Ketoconazole cream, finished Lamisil tablets a few weeks ago ) and Skin Problem (Check a irritated growth on the L leg).  The following portions of the chart were reviewed this encounter and updated as appropriate:   Tobacco  Allergies  Meds  Problems  Med Hx  Surg Hx  Fam Hx     Review of Systems:  No other skin or systemic complaints except as noted in HPI or Assessment and Plan.  Objective  Well appearing patient in no apparent distress; mood and affect are within normal limits.  A focused examination was performed including face,legs,toenails . Relevant physical exam findings are noted in the Assessment and Plan.  Objective  feet, toenails: Scaling and maceration web spaces and over distal and lateral soles.   Objective  L distal medial pretibal: Erythematous keratotic or waxy stuck-on papule or plaque.    Assessment & Plan  Tinea pedis and unguium of the feet and toenails feet, toenails Chronic and persistent Tinea pedis/tinea unguium  Patient is tolerating oral Lamisil fine without side effects. Cont Lamisil 250 mg once a day #30 1 RF then stop   Labs reviewed from 12/08/2020 LFT WNL   Ordered Medications: terbinafine (LAMISIL) 250 MG tablet  Inflamed seborrheic keratosis L distal medial pretibal Destruction of lesion - L distal medial pretibal Complexity: simple   Destruction method: cryotherapy   Informed consent: discussed and consent obtained   Timeout:  patient name, date of birth, surgical site, and procedure verified Lesion destroyed using liquid nitrogen: Yes   Region frozen until ice ball extended beyond lesion: Yes   Outcome: patient tolerated procedure well with no complications   Post-procedure details: wound care instructions given    Actinic Damage - chronic, secondary to cumulative UV  radiation exposure/sun exposure over time - diffuse scaly erythematous macules with underlying dyspigmentation - Recommend daily broad spectrum sunscreen SPF 30+ to sun-exposed areas, reapply every 2 hours as needed.  - Recommend staying in the shade or wearing long sleeves, sun glasses (UVA+UVB protection) and wide brim hats (4-inch brim around the entire circumference of the hat). - Call for new or changing lesions.  Return in about 6 months (around 06/28/2021) for Tinea pedis .  IMarye Round, CMA, am acting as scribe for Sarina Ser, MD .  Documentation: I have reviewed the above documentation for accuracy and completeness, and I agree with the above.  Sarina Ser, MD

## 2020-12-26 NOTE — Telephone Encounter (Signed)
Received call from patient wife.   Reports that she contact imaging department to schedule AAA US Aorta at the same time as knee xray, but was advised that Knee is DG 4 view instead of sunrise with patella. Also states that patient was supposed to have L shoulder MRI scheduled.   Please advise.

## 2020-12-26 NOTE — Patient Instructions (Signed)
We discussed using Lamisil for onychomycosis. This drug offers a fairly high but not universal cure rate. A 12 week treatment course is recommended. The patient is aware that rare cases of liver injury have been reported; and agrees to come in for liver function tests at 6 weeks of treatment. The symptoms of liver disease have been discussed; call if such occurs. In addition, some insurance plans do not cover the expense of this drug for treating a cosmetic condition, and the patient understands they may have to pay for the medication. Other side effects, such as headaches and rashes, have also been discussed.

## 2020-12-27 ENCOUNTER — Other Ambulatory Visit: Payer: Self-pay | Admitting: Family Medicine

## 2020-12-27 ENCOUNTER — Other Ambulatory Visit: Payer: Self-pay | Admitting: *Deleted

## 2020-12-27 DIAGNOSIS — G8929 Other chronic pain: Secondary | ICD-10-CM

## 2020-12-27 DIAGNOSIS — M25562 Pain in left knee: Secondary | ICD-10-CM

## 2020-12-27 DIAGNOSIS — M25512 Pain in left shoulder: Secondary | ICD-10-CM

## 2020-12-27 NOTE — Telephone Encounter (Signed)
Call placed to patient and patient and patient wife Gregory Suarez made aware.

## 2020-12-27 NOTE — Telephone Encounter (Signed)
I am referring him to an orthopedist for his shoulder pain. I will correct the knee order.

## 2020-12-28 ENCOUNTER — Encounter: Payer: Self-pay | Admitting: Dermatology

## 2020-12-30 ENCOUNTER — Ambulatory Visit
Admission: RE | Admit: 2020-12-30 | Discharge: 2020-12-30 | Disposition: A | Discharge status: Home / Self Care | Payer: Medicare Other | Source: Ambulatory Visit | Attending: Family Medicine | Admitting: Family Medicine

## 2020-12-30 DIAGNOSIS — Z136 Encounter for screening for cardiovascular disorders: Secondary | ICD-10-CM | POA: Diagnosis not present

## 2020-12-30 DIAGNOSIS — Z87891 Personal history of nicotine dependence: Secondary | ICD-10-CM | POA: Diagnosis not present

## 2020-12-30 DIAGNOSIS — M25562 Pain in left knee: Secondary | ICD-10-CM | POA: Diagnosis not present

## 2020-12-30 DIAGNOSIS — G8929 Other chronic pain: Secondary | ICD-10-CM

## 2021-01-03 ENCOUNTER — Ambulatory Visit: Payer: Medicare Other | Admitting: Orthopaedic Surgery

## 2021-01-11 ENCOUNTER — Ambulatory Visit: Payer: Medicare Other | Admitting: Orthopaedic Surgery

## 2021-01-17 ENCOUNTER — Telehealth: Payer: Self-pay

## 2021-01-17 ENCOUNTER — Other Ambulatory Visit: Payer: Self-pay | Admitting: Physician Assistant

## 2021-01-17 DIAGNOSIS — Z0000 Encounter for general adult medical examination without abnormal findings: Secondary | ICD-10-CM

## 2021-01-17 MED ORDER — PRAMIPEXOLE DIHYDROCHLORIDE 0.25 MG PO TABS: 0.5000 mg | ORAL_TABLET | Freq: Every day | ORAL | 1 refills | Status: DC

## 2021-01-17 NOTE — Telephone Encounter (Signed)
Received fax from pharmacy patient need med refills  pramipexole (MIRAPEX) 0.25 MG tablet  Pharmacy: [id number] - [place], [place]  [address] [place] [address]  Phone:  [phone] Fax:  [phone]

## 2021-01-17 NOTE — Telephone Encounter (Signed)
Prescription sent to pharmacy.

## 2021-01-17 NOTE — Addendum Note (Signed)
Addended by: Sheral Flow on: 01/17/2021 04:33 PM   Modules accepted: Orders

## 2021-01-26 DIAGNOSIS — M25512 Pain in left shoulder: Secondary | ICD-10-CM | POA: Diagnosis not present

## 2021-02-07 ENCOUNTER — Telehealth: Payer: Self-pay | Admitting: Pharmacist

## 2021-02-07 NOTE — Progress Notes (Addendum)
Chronic Care Management Pharmacy Assistant   Name: Gregory Suarez  MRN: [id number] DOB: 1946-03-02  Reason for Encounter: Disease Call for COPD & HTN.    Conditions to be addressed/monitored: CAD, HTN, GERD, Hyperlipidemia, Erectile dysfunction.  Recent office visits:  12/14/20 Dr. [name] For Follow-Up. No medication changes.   Recent consult visits:  12/26/20 Dermatology [name], MD. For Tinea pedis of right foot.   Hospital visits:  None in previous 6 months  Medications: Outpatient Encounter Medications as of 02/07/2021  Medication Sig   amLODipine (NORVASC) 2.5 MG tablet TAKE 1 TABLET BY MOUTH EVERY DAY   aspirin EC 81 MG tablet Take 1 tablet (81 mg total) by mouth daily.   atorvastatin (LIPITOR) 80 MG tablet TAKE 1 TABLET BY MOUTH EVERY DAY   benzonatate (TESSALON) 100 MG capsule Take 100 mg by mouth 3 (three) times daily as needed for cough.   chlorpheniramine (CHLOR-TRIMETON) 4 MG tablet Take 8 mg by mouth at bedtime.    finasteride (PROSCAR) 5 MG tablet Take 1 tablet (5 mg total) by mouth daily.   fluticasone (FLONASE) 50 MCG/ACT nasal spray Place 2 sprays into both nostrils daily as needed for allergies or rhinitis.   furosemide (LASIX) 20 MG tablet TAKE 1 TABLET BY MOUTH EVERY DAY   hydrochlorothiazide (MICROZIDE) 12.5 MG capsule Take 12.5 mg by mouth daily as needed (fluid/swelling).   HYDROcodone-homatropine (HYCODAN) 5-1.5 MG/5ML syrup Take 5 mLs by mouth every 8 (eight) hours as needed for cough.   hydrocortisone 2.5 % cream Apply topically as directed. Apply to scaly rash on face hs on Tuesday, Thursday, and Saturday   lidocaine (LIDODERM) 5 % Place 2 patches onto the skin daily. Remove & Discard patch within 12 hours or as directed by MD   Melatonin 5 MG TABS Take 10 mg by mouth at bedtime.    metoprolol tartrate (LOPRESSOR) 25 MG tablet TAKE 1 TABLET BY MOUTH TWICE A DAY   mometasone (ELOCON) 0.1 % cream APPLY TO AFFECTED AREA TWICE A DAY   Multiple  Vitamin (MULTIVITAMIN WITH MINERALS) TABS tablet Take 1 tablet by mouth in the morning and at bedtime.    pantoprazole (PROTONIX) 40 MG tablet Take 1 tablet (40 mg total) by mouth 2 (two) times daily.   pramipexole (MIRAPEX) 0.25 MG tablet Take 2 tablets (0.5 mg total) by mouth at bedtime.   tadalafil (CIALIS) 5 MG tablet Take 1 tablet (5 mg total) by mouth daily.   terbinafine (LAMISIL) 250 MG tablet Take 1 tablet (250 mg total) by mouth daily.   terbinafine (LAMISIL) 250 MG tablet Take 1 tablet (250 mg total) by mouth daily.   tiZANidine (ZANAFLEX) 4 MG tablet Take 1 tablet (4 mg total) by mouth every 8 (eight) hours as needed for muscle spasms.   valACYclovir (VALTREX) 1000 MG tablet Take 1,000 mg by mouth daily as needed (outbreak).   No facility-administered encounter medications on file as of 02/07/2021.    Current COPD regimen: None.  No flowsheet data found.   Any recent hospitalizations or ED visits since last visit with CPP? No,  Adherence Review: Does the patient have >5 day gap between last estimated fill date for maintenance inhaler medications? N/A  Reviewed chart prior to disease state call. Spoke with patient regarding BP  Recent Office Vitals: BP Readings from Last 3 Encounters:  12/14/20 102/74  11/21/20 130/73  11/08/20 118/76   Pulse Readings from Last 3 Encounters:  12/14/20 (!) 50  11/21/20 (!) 54  11/08/20 (!) 55    Wt Readings from Last 3 Encounters:  12/14/20 256 lb (116.1 kg)  11/21/20 256 lb 4.8 oz (116.3 kg)  11/08/20 255 lb (115.7 kg)     Kidney Function Lab Results  Component Value Date/Time   CREATININE 0.91 12/08/2020 08:43 AM   CREATININE 0.96 11/14/2020 08:49 AM   CREATININE 0.89 04/13/2020 10:08 AM   CREATININE 0.94 12/08/2019 08:01 AM   GFRNONAA 83 12/08/2020 08:43 AM   GFRAA 96 12/08/2020 08:43 AM    BMP Latest Ref Rng & Units 12/08/2020 11/14/2020 04/13/2020  Glucose 65 - 99 mg/dL 86 80 133(H)  BUN 7 - 25 mg/dL 13 12 14    Creatinine 0.70 - 1.18 mg/dL 0.91 0.96 0.89  BUN/Creat Ratio 6 - 22 (calc) NOT APPLICABLE 13 -  Sodium 135 - 146 mmol/L 142 142 141  Potassium 3.5 - 5.3 mmol/L 4.3 4.3 4.0  Chloride 98 - 110 mmol/L 109 103 103  CO2 20 - 32 mmol/L 23 24 27   Calcium 8.6 - 10.3 mg/dL 8.5(L) 8.9 9.1    Current antihypertensive regimen:  Amlodipine 2.5 mg 1 tablet daily  What recent interventions/DTPs have been made by any provider to improve Blood Pressure control since last CPP Visit: None  Any recent hospitalizations or ED visits since last visit with CPP? No.  Adherence Review: Is the patient currently on ACE/ARB medication? None.  Does the patient have >5 day gap between last estimated fill dates? Per misc rpts, no.  Star Rating Drugs: Atorvastatin 80 mg 90 DS 01/18/21  Spoke with the patients wife and she refused to allow me to speak to her husband as well as refused to answer any questions in regards of her husband. From my understanding she does not want [place] to call her or her husband.   Follow-Up:Pharmacist Review  Charlann Lange, [place] Pharmacist Assistant [phone]

## 2021-02-08 DIAGNOSIS — M25512 Pain in left shoulder: Secondary | ICD-10-CM | POA: Diagnosis not present

## 2021-02-15 DIAGNOSIS — M25511 Pain in right shoulder: Secondary | ICD-10-CM | POA: Diagnosis not present

## 2021-02-15 DIAGNOSIS — M75102 Unspecified rotator cuff tear or rupture of left shoulder, not specified as traumatic: Secondary | ICD-10-CM | POA: Diagnosis not present

## 2021-02-23 ENCOUNTER — Other Ambulatory Visit: Payer: Self-pay | Admitting: Dermatology

## 2021-02-23 DIAGNOSIS — B351 Tinea unguium: Secondary | ICD-10-CM

## 2021-02-23 DIAGNOSIS — B353 Tinea pedis: Secondary | ICD-10-CM

## 2021-02-24 ENCOUNTER — Other Ambulatory Visit: Payer: Self-pay | Admitting: *Deleted

## 2021-02-24 MED ORDER — PANTOPRAZOLE SODIUM 40 MG PO TBEC: 40.0000 mg | DELAYED_RELEASE_TABLET | Freq: Two times a day (BID) | ORAL | 3 refills | Status: DC

## 2021-02-24 MED ORDER — FINASTERIDE 5 MG PO TABS: 5.0000 mg | ORAL_TABLET | Freq: Every day | ORAL | 3 refills | Status: DC

## 2021-02-24 MED ORDER — METOPROLOL TARTRATE 25 MG PO TABS: 25.0000 mg | ORAL_TABLET | Freq: Two times a day (BID) | ORAL | 2 refills | Status: DC

## 2021-03-01 DIAGNOSIS — M1712 Unilateral primary osteoarthritis, left knee: Secondary | ICD-10-CM | POA: Diagnosis not present

## 2021-03-01 DIAGNOSIS — M75102 Unspecified rotator cuff tear or rupture of left shoulder, not specified as traumatic: Secondary | ICD-10-CM | POA: Diagnosis not present

## 2021-03-05 DIAGNOSIS — M179 Osteoarthritis of knee, unspecified: Secondary | ICD-10-CM | POA: Insufficient documentation

## 2021-03-14 DIAGNOSIS — M25512 Pain in left shoulder: Secondary | ICD-10-CM | POA: Diagnosis not present

## 2021-03-29 DIAGNOSIS — M25512 Pain in left shoulder: Secondary | ICD-10-CM | POA: Diagnosis not present

## 2021-04-05 DIAGNOSIS — M25512 Pain in left shoulder: Secondary | ICD-10-CM | POA: Diagnosis not present

## 2021-04-12 DIAGNOSIS — M25512 Pain in left shoulder: Secondary | ICD-10-CM | POA: Diagnosis not present

## 2021-04-19 DIAGNOSIS — M25512 Pain in left shoulder: Secondary | ICD-10-CM | POA: Diagnosis not present

## 2021-04-23 IMAGING — US US ABDOMINAL AORTA SCREENING AAA
1 series · 14 of 19 positions shown · non-contrast
Comparison: None.

CLINICAL DATA: Male between 65-75 years of age with a smoking
history.

EXAM:
US ABDOMINAL AORTA MEDICARE SCREENING
TECHNIQUE: Ultrasound examination of the abdominal aorta was performed as a
screening evaluation for abdominal aortic aneurysm.

[Series 1: us abdominal aorta screening aaa · 0.36mm/px · 14 of 19 slices shown]
[im 1/19]
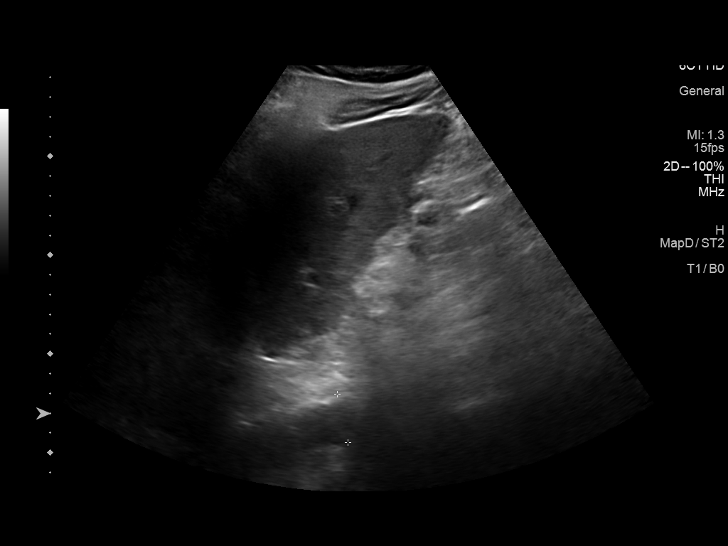
[im 3/19]
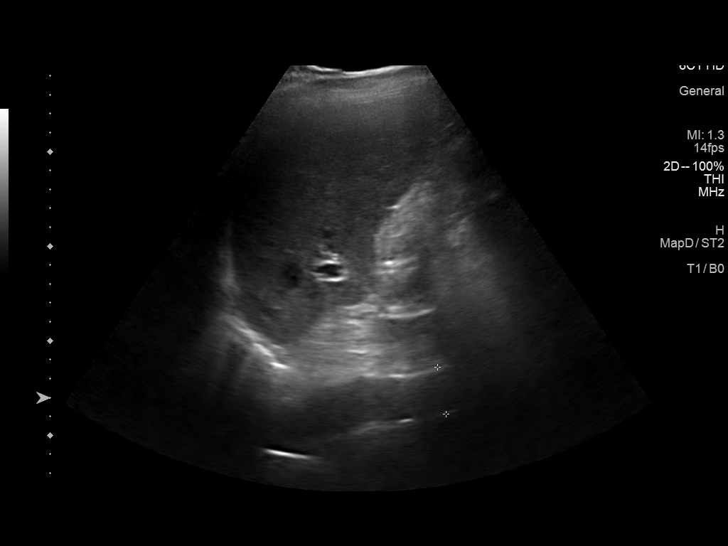
[im 4/19]
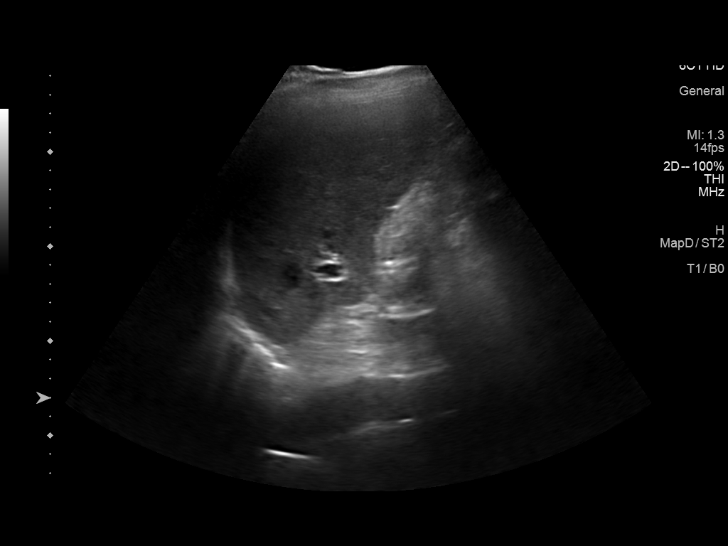
[im 5/19]
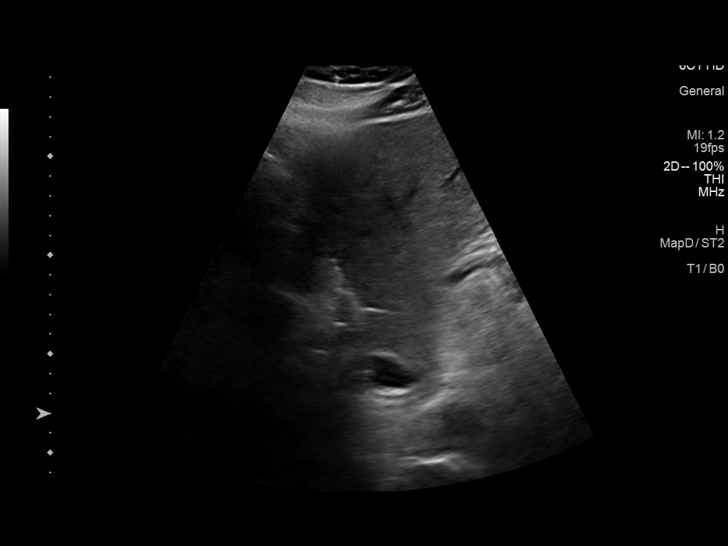
[im 7/19]
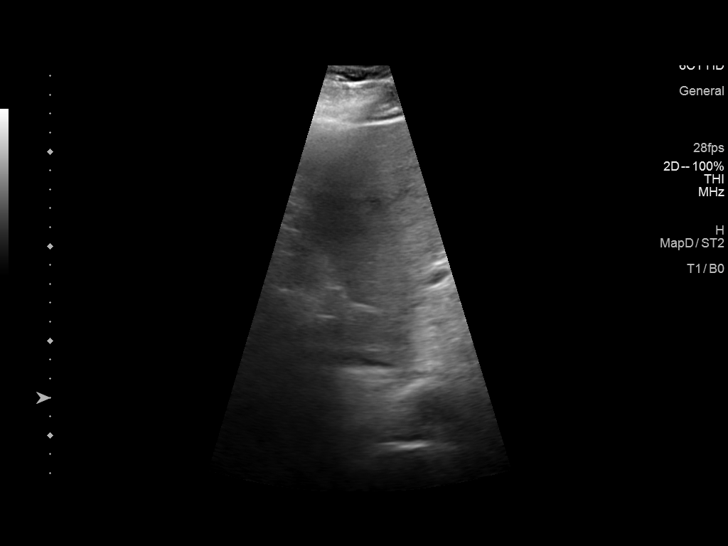
[im 8/19]
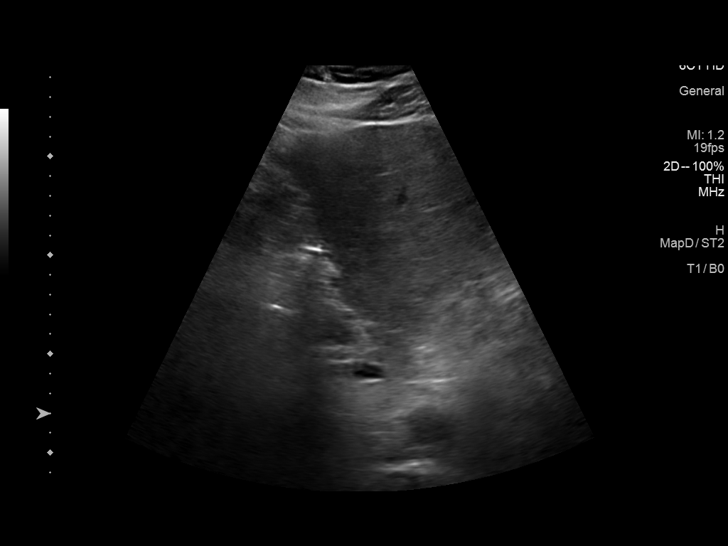
[im 9/19]
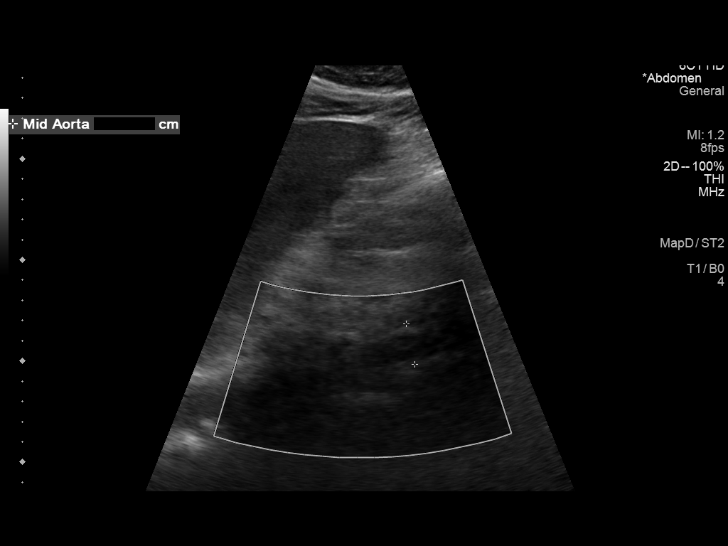
[im 11/19]
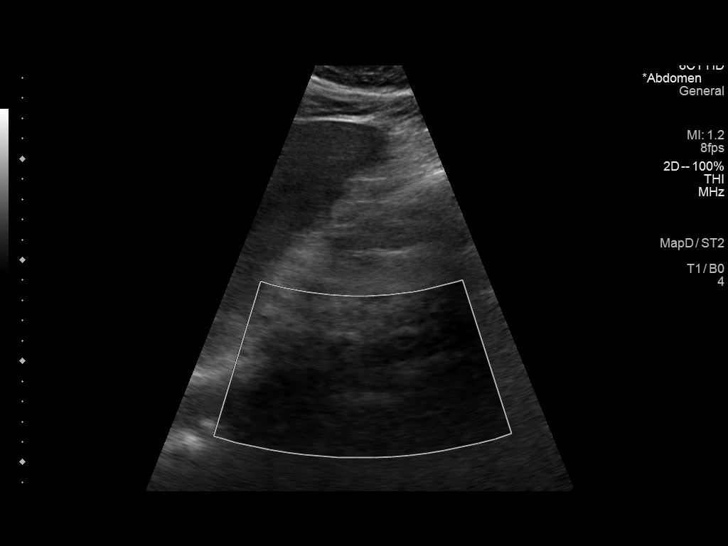
[im 12/19]
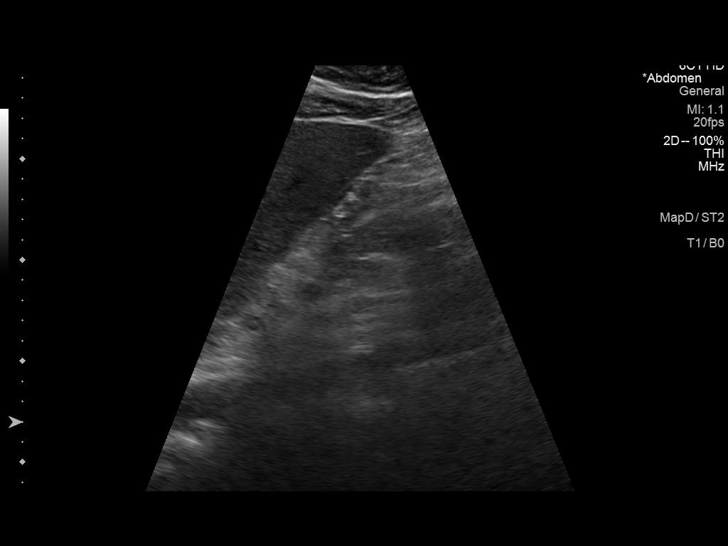
[im 13/19]
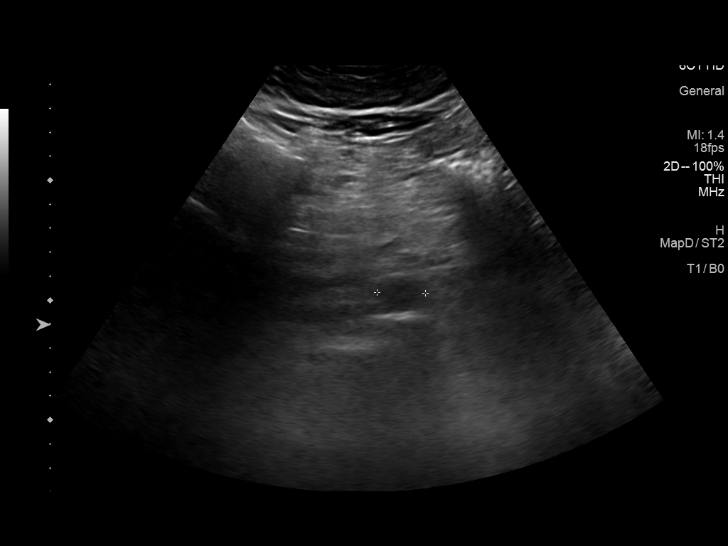
[im 15/19]
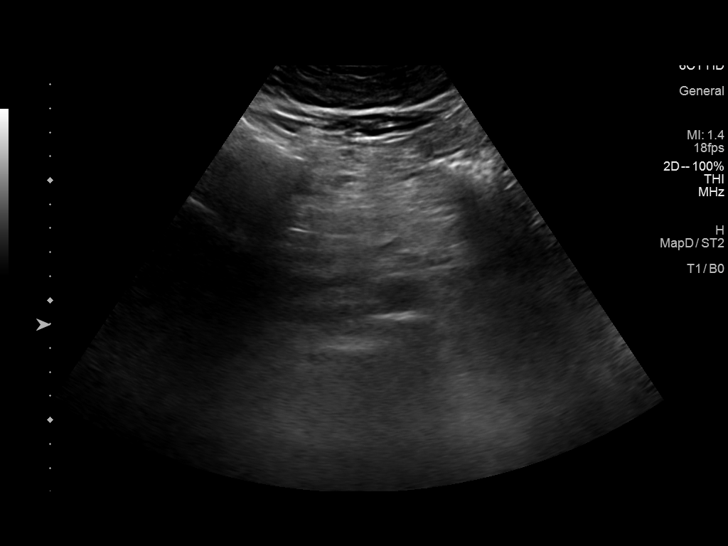
[im 16/19]
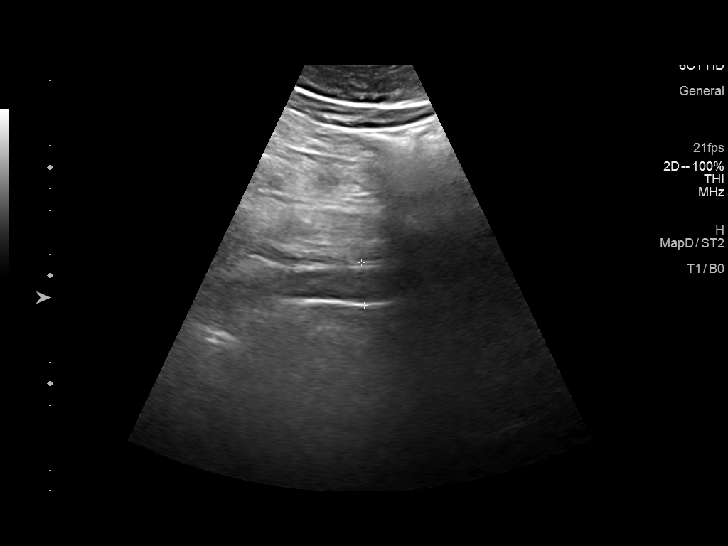
[im 17/19]
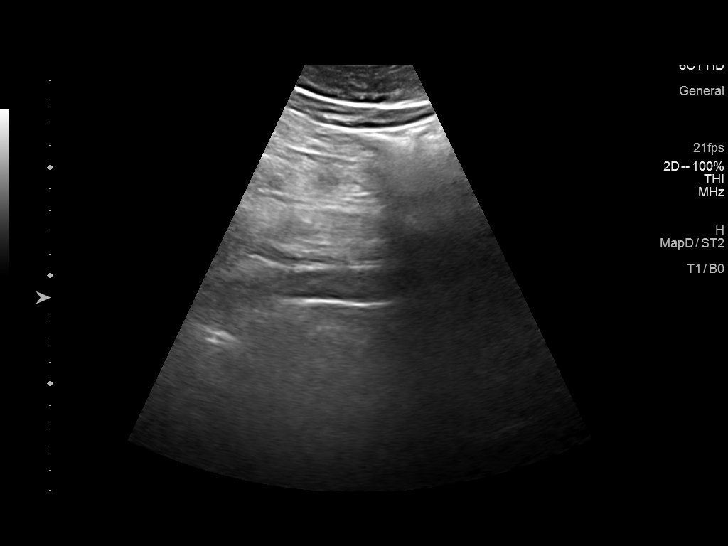
[im 19/19]
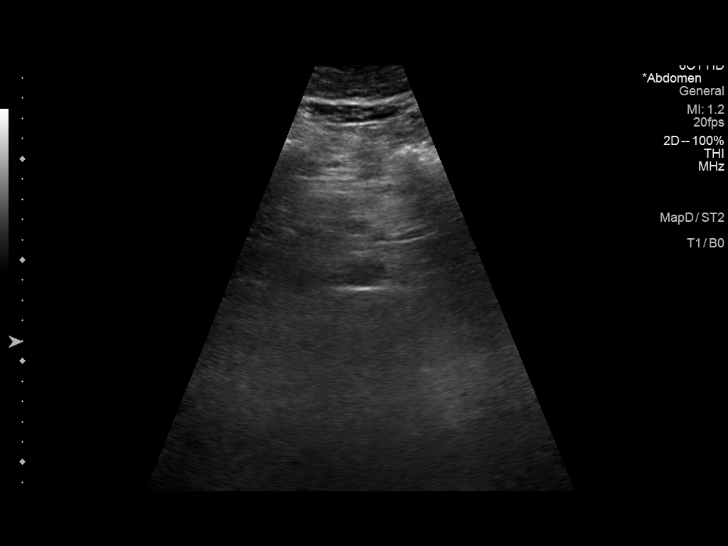

[14 of 19 positions shown; findings below may reference images not displayed]

FINDINGS: Abdominal aortic measurements as follows:

Proximal:  2.4 x 2.5 cm

Mid:  2.0 x 2.1 cm

Distal:  2.0 x 2.3 cm

Some scattered calcified plaque is suspected in the abdominal aorta
by ultrasound.
IMPRESSION: No evidence of abdominal aortic aneurysm. Suspect aortic
atherosclerosis with scattered calcified plaque present.

## 2021-05-25 DIAGNOSIS — U071 COVID-19: Secondary | ICD-10-CM | POA: Diagnosis not present

## 2021-06-28 ENCOUNTER — Ambulatory Visit (INDEPENDENT_AMBULATORY_CARE_PROVIDER_SITE_OTHER): Payer: Medicare Other | Admitting: Dermatology

## 2021-06-28 ENCOUNTER — Other Ambulatory Visit: Payer: Self-pay

## 2021-06-28 DIAGNOSIS — D692 Other nonthrombocytopenic purpura: Secondary | ICD-10-CM

## 2021-06-28 DIAGNOSIS — B353 Tinea pedis: Secondary | ICD-10-CM | POA: Diagnosis not present

## 2021-06-28 DIAGNOSIS — I2510 Atherosclerotic heart disease of native coronary artery without angina pectoris: Secondary | ICD-10-CM | POA: Diagnosis not present

## 2021-06-28 NOTE — Progress Notes (Signed)
   Follow-Up Visit   Subjective  Gregory Suarez is a 75 y.o. male who presents for the following: Follow-up (6 months f/u tinea pedis, pt took Lamisil tablet for 3 months ) and Rash (Check black spots on the arms ).  The following portions of the chart were reviewed this encounter and updated as appropriate:   Tobacco  Allergies  Meds  Problems  Med Hx  Surg Hx  Fam Hx     Review of Systems:  No other skin or systemic complaints except as noted in HPI or Assessment and Plan.  Objective  Well appearing patient in no apparent distress; mood and affect are within normal limits.  A focused examination was performed including upper extremities, including the arms, hands, fingers, and fingernails and arms,feet. Relevant physical exam findings are noted in the Assessment and Plan.  feet Scaling and maceration web spaces and over distal and lateral soles.   Left Forearm - Anterior Purpura on the arms    Assessment & Plan  Tinea pedis of both feet feet  Chronic and persistent Tinea pedis/tinea unguium  Patient is tolerating oral Lamisil fine without side effects. Cont Lamisil 250 mg once a day #30 1 RF then stop   Lab order Liver panel   Related Procedures Hepatic Function Panel  Other nonthrombocytopenic purpura (HCC) Left Forearm - Anterior  Labs reviewed from March 2022 showed normal platelets  - Violaceous macules and patches - Benign - Related to trauma, age, sun damage and/or use of blood thinners, chronic use of topical and/or oral steroids - Observe - Can use OTC arnica containing moisturizer such as Dermend Bruise Formula if desired - Call for worsening or other concerns   Pt request lab order for CBC with diff and platelet check due to increased bruising.  Related Procedures CBC with Differential/Platelets  Return in about 6 months (around 12/26/2021) for tinea pedis .  IMarye Round, CMA, am acting as scribe for Sarina Ser, MD .  Documentation: I  have reviewed the above documentation for accuracy and completeness, and I agree with the above.  Sarina Ser, MD

## 2021-06-28 NOTE — Patient Instructions (Signed)

## 2021-06-29 LAB — HEPATIC FUNCTION PANEL
ALT: 13 IU/L (ref 0–44)
AST: 18 IU/L (ref 0–40)
Albumin: 4.2 g/dL (ref 3.7–4.7)
Alkaline Phosphatase: 125 IU/L — ABNORMAL HIGH (ref 44–121)
Bilirubin Total: 0.7 mg/dL (ref 0.0–1.2)
Bilirubin, Direct: 0.24 mg/dL (ref 0.00–0.40)
Total Protein: 6.5 g/dL (ref 6.0–8.5)

## 2021-06-29 LAB — CBC WITH DIFFERENTIAL/PLATELET
Basophils Absolute: 0 10*3/uL (ref 0.0–0.2)
Basos: 1 %
EOS (ABSOLUTE): 0.3 10*3/uL (ref 0.0–0.4)
Eos: 3 %
Hematocrit: 44.2 % (ref 37.5–51.0)
Hemoglobin: 14.9 g/dL (ref 13.0–17.7)
Immature Grans (Abs): 0.1 10*3/uL (ref 0.0–0.1)
Immature Granulocytes: 1 %
Lymphocytes Absolute: 1 10*3/uL (ref 0.7–3.1)
Lymphs: 12 %
MCH: 30.7 pg (ref 26.6–33.0)
MCHC: 33.7 g/dL (ref 31.5–35.7)
MCV: 91 fL (ref 79–97)
Monocytes Absolute: 0.7 10*3/uL (ref 0.1–0.9)
Monocytes: 8 %
Neutrophils Absolute: 6.2 10*3/uL (ref 1.4–7.0)
Neutrophils: 75 %
Platelets: 217 10*3/uL (ref 150–450)
RBC: 4.86 x10E6/uL (ref 4.14–5.80)
RDW: 13.2 % (ref 11.6–15.4)
WBC: 8.2 10*3/uL (ref 3.4–10.8)

## 2021-06-30 ENCOUNTER — Telehealth: Payer: Self-pay

## 2021-06-30 ENCOUNTER — Encounter: Payer: Self-pay | Admitting: Dermatology

## 2021-06-30 DIAGNOSIS — B351 Tinea unguium: Secondary | ICD-10-CM

## 2021-06-30 DIAGNOSIS — B353 Tinea pedis: Secondary | ICD-10-CM

## 2021-06-30 MED ORDER — TERBINAFINE HCL 250 MG PO TABS: 250.0000 mg | ORAL_TABLET | Freq: Every day | ORAL | 1 refills | Status: DC

## 2021-06-30 NOTE — Telephone Encounter (Signed)
Patient informed of lab results. Refills sent in.

## 2021-06-30 NOTE — Telephone Encounter (Signed)
-----   Message from [name], MD sent at 06/29/2021  5:25 PM EDT ----- Lab is OK Blood counts and Platelets normal. Liver tests OK. May continue with antifungal pills (Lamisil / Terbinafine)

## 2021-07-12 ENCOUNTER — Other Ambulatory Visit: Payer: Self-pay

## 2021-07-12 DIAGNOSIS — B351 Tinea unguium: Secondary | ICD-10-CM

## 2021-07-12 DIAGNOSIS — B353 Tinea pedis: Secondary | ICD-10-CM

## 2021-07-12 MED ORDER — TERBINAFINE HCL 250 MG PO TABS
250.0000 mg | ORAL_TABLET | Freq: Every day | ORAL | 1 refills | Status: AC
Start: 2021-07-12 — End: ?

## 2021-07-18 ENCOUNTER — Other Ambulatory Visit: Payer: Self-pay | Admitting: Family Medicine

## 2021-07-21 DIAGNOSIS — U071 COVID-19: Secondary | ICD-10-CM | POA: Diagnosis not present

## 2021-07-25 DIAGNOSIS — M25561 Pain in right knee: Secondary | ICD-10-CM | POA: Diagnosis not present

## 2021-07-25 DIAGNOSIS — M25511 Pain in right shoulder: Secondary | ICD-10-CM | POA: Diagnosis not present

## 2021-08-29 ENCOUNTER — Other Ambulatory Visit: Payer: Self-pay

## 2021-08-29 MED ORDER — TADALAFIL 5 MG PO TABS: 5.0000 mg | ORAL_TABLET | Freq: Every day | ORAL | 2 refills | Status: DC

## 2021-08-30 DIAGNOSIS — Z20822 Contact with and (suspected) exposure to covid-19: Secondary | ICD-10-CM | POA: Diagnosis not present

## 2021-09-05 DIAGNOSIS — H6121 Impacted cerumen, right ear: Secondary | ICD-10-CM | POA: Diagnosis not present

## 2021-09-05 DIAGNOSIS — R42 Dizziness and giddiness: Secondary | ICD-10-CM | POA: Diagnosis not present

## 2021-09-11 DIAGNOSIS — R42 Dizziness and giddiness: Secondary | ICD-10-CM | POA: Diagnosis not present

## 2021-09-11 DIAGNOSIS — H903 Sensorineural hearing loss, bilateral: Secondary | ICD-10-CM | POA: Diagnosis not present

## 2021-09-21 DIAGNOSIS — R42 Dizziness and giddiness: Secondary | ICD-10-CM | POA: Diagnosis not present

## 2021-09-21 DIAGNOSIS — M542 Cervicalgia: Secondary | ICD-10-CM | POA: Diagnosis not present

## 2021-09-21 DIAGNOSIS — R278 Other lack of coordination: Secondary | ICD-10-CM | POA: Diagnosis not present

## 2021-09-21 DIAGNOSIS — R519 Headache, unspecified: Secondary | ICD-10-CM | POA: Diagnosis not present

## 2021-10-04 DIAGNOSIS — R278 Other lack of coordination: Secondary | ICD-10-CM | POA: Diagnosis not present

## 2021-10-04 DIAGNOSIS — R519 Headache, unspecified: Secondary | ICD-10-CM | POA: Diagnosis not present

## 2021-10-04 DIAGNOSIS — M542 Cervicalgia: Secondary | ICD-10-CM | POA: Diagnosis not present

## 2021-10-04 DIAGNOSIS — R42 Dizziness and giddiness: Secondary | ICD-10-CM | POA: Diagnosis not present

## 2021-10-09 DIAGNOSIS — R278 Other lack of coordination: Secondary | ICD-10-CM | POA: Diagnosis not present

## 2021-10-09 DIAGNOSIS — R42 Dizziness and giddiness: Secondary | ICD-10-CM | POA: Diagnosis not present

## 2021-10-09 DIAGNOSIS — R519 Headache, unspecified: Secondary | ICD-10-CM | POA: Diagnosis not present

## 2021-10-09 DIAGNOSIS — M542 Cervicalgia: Secondary | ICD-10-CM | POA: Diagnosis not present

## 2021-10-16 DIAGNOSIS — R42 Dizziness and giddiness: Secondary | ICD-10-CM | POA: Diagnosis not present

## 2021-10-16 DIAGNOSIS — R519 Headache, unspecified: Secondary | ICD-10-CM | POA: Diagnosis not present

## 2021-10-16 DIAGNOSIS — M542 Cervicalgia: Secondary | ICD-10-CM | POA: Diagnosis not present

## 2021-10-16 DIAGNOSIS — R278 Other lack of coordination: Secondary | ICD-10-CM | POA: Diagnosis not present

## 2021-12-07 ENCOUNTER — Ambulatory Visit: Payer: BLUE CROSS/BLUE SHIELD | Admitting: Podiatry

## 2021-12-11 ENCOUNTER — Encounter: Payer: Self-pay | Admitting: Cardiovascular Disease

## 2021-12-11 ENCOUNTER — Other Ambulatory Visit: Payer: Self-pay

## 2021-12-11 ENCOUNTER — Ambulatory Visit (INDEPENDENT_AMBULATORY_CARE_PROVIDER_SITE_OTHER): Payer: Medicare Other | Admitting: Cardiovascular Disease

## 2021-12-11 VITALS — BP 128/66 | HR 57 | Ht 75.0 in | Wt 268.6 lb

## 2021-12-11 DIAGNOSIS — K219 Gastro-esophageal reflux disease without esophagitis: Secondary | ICD-10-CM | POA: Diagnosis not present

## 2021-12-11 DIAGNOSIS — G2581 Restless legs syndrome: Secondary | ICD-10-CM | POA: Diagnosis not present

## 2021-12-11 DIAGNOSIS — G4733 Obstructive sleep apnea (adult) (pediatric): Secondary | ICD-10-CM | POA: Diagnosis not present

## 2021-12-11 DIAGNOSIS — I10 Essential (primary) hypertension: Secondary | ICD-10-CM

## 2021-12-11 DIAGNOSIS — I2510 Atherosclerotic heart disease of native coronary artery without angina pectoris: Secondary | ICD-10-CM | POA: Diagnosis not present

## 2021-12-11 DIAGNOSIS — R600 Localized edema: Secondary | ICD-10-CM | POA: Diagnosis not present

## 2021-12-11 DIAGNOSIS — E785 Hyperlipidemia, unspecified: Secondary | ICD-10-CM | POA: Diagnosis not present

## 2021-12-11 NOTE — Patient Instructions (Signed)
Medication Instructions:  ? ?-Increase hydrochlorothiazide (microzide) 12.'5mg'$  to every other day while ankles are swollen. ? ?*If you need a refill on your cardiac medications before your next appointment, please call your pharmacy* ? ? ?Follow-Up: ?At [facility], you and your health needs are our priority.  As part of our continuing mission to provide you with exceptional heart care, we have created designated Provider Care Teams.  These Care Teams include your primary Cardiologist (physician) and Advanced Practice Providers (APPs -  Physician Assistants and Nurse Practitioners) who all work together to provide you with the care you need, when you need it. ? ?We recommend signing up for the patient portal called "MyChart".  Sign up information is provided on this After Visit Summary.  MyChart is used to connect with patients for Virtual Visits (Telemedicine).  Patients are able to view lab/test results, encounter notes, upcoming appointments, etc.  Non-urgent messages can be sent to your provider as well.   ?To learn more about what you can do with MyChart, go to [email or web address].   ? ?Your next appointment:   ?12 month(s) ? ?The format for your next appointment:   ?In Person ? ?Provider:   ?Shelva Majestic, MD  ?

## 2021-12-11 NOTE — Progress Notes (Incomplete)
Primary MD: Dr. Jenna Luo  PATIENT PROFILE: Gregory Suarez is a 76 y.o. male who presents for a 13 month follow-up evaluaiton.  HPI:  Gregory Suarez is a former patient of Dr. Joni Fears.  He underwent heart catheterization over 20 ago and was told of having mild blockage which medical therapy was recommended. He has a long history of sleep apnea and initially was followed by Dr. Danton Sewer.  He on his third CPAP machine.  He has a istory of significant obesity when I last saw him in 2017 he had lost over 50 pounds   He denied any exertional chest pain but admits to intermittent left sharp, achy discomfort which is nonexertional.  He denied associated palpitations or shortness of breath.  He admitted to leg swelling and wore an ankle brace..  When I last saw him, he was stable from a cardiac standpoint.  An echo Doppler study in December 2017 showed an EF of 55 to 60%.  Over the past several years, he had done well with reference to chest pain or palpitations.  I  saw him in February 2020.  He had undergone a recent chest CT which showed emphysema, aortic atherosclerosis, as well as extensive coronary calcifications.  He has not been using his CPAP therapy and I obtained a download  in the office which showed only 2 days of use over the last 90 days.  He also has a history of restless leg syndrome.  He typically goes to bed between 9 and 11 PM and wakes up between 7 and 9 AM.  He had see Dr. [name] and because of his abnormal CT scan was sent to me in February for follow-up evaluation.  At that time, in light of his significant coronary calcification I recommended that he undergo a coronary CT angiogram.  This was significantly abnormal and revealed a markedly elevated calcium score of 2264 with diffuse coronary calcification and stenoses in the proximal LAD, diagonal, circumflex marginal, and RCA.    As a result he was referred for cardiac catheterization which I performed on Feb 13 2019.  He was  found to have diffuse multivessel coronary calcification with 50 to 60% proximal LAD stenoses, diffuse 50% stenosis in the first diagonal branch of the LAD with mild irregularity in the mid distal LAD without high-grade stenosis.  Left circumflex vessel had smooth 60% mid distal OM stenosis in the RCA had mild irregularity with proximal to mid calcification and a dominant RCA with stenosis of 20%.  He had preserved LV contractility with an EF at 55%. Medical therapy was recommended.  He had been started on low-dose beta-blocker therapy prior to the catheterization and subsequent to the procedure I recommended initiation of amlodipine 5 mg and further titration of atorvastatin to 80 mg.  I last saw him in August 2020.  At that time he continued to feel well and denied any recurrent chest pain, palpitations or shortness of breath.  His heart rate was running in the mid 50s.  He had noticed leg edema.  He has been 100% compliant with BiPAP therapy.  A download was obtained from July 6 through May 05, 2019.  This shows 100% use and he is averaging 8 hours and 56 minutes of BiPAP therapy per night.  At an 18/14 pressure, AHI is excellent at 1.6.  There is no leak.  He continues to be on atorvastatin 80 mg.  LDL cholesterol on April 22, 2019 was excellent at 39 with  total cholesterol 102 HDL 43 triglyceride 76.    Since his last evaluation with me he was hospitalized in June 2021 with a collapsed lung.  He ultimately underwent talc pleurodesis during his hospitalization.  He has been seen by Almyra Deforest, PA and floor his lower extremity edema he was given several days of Lasix with benefit.  Presently, he feels well.  He has arthritic issues involving his knees and feet with some discomfort with walking.  He is unaware of palpitations.  He denies recurrent anginal symptomatology.  He has continued to be on amlodipine 2.5 mg, furosemide 20 mg, metoprolol tartrate 25 mg twice a day for blood pressure.  He is on  atorvastatin 80 mg for hyperlipidemia.  Recent laboratory on November 14, 2020 showed an LDL cholesterol of 53.  He continues to use BiPAP.  I obtained a new download from January 20 through November 18, 2020 which confirms excellent compliance with average use at 9 hours and 40 minutes.  At an 18/14 pressure AHI is excellent at 1.0.  He presents for evaluation.  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   right temple : sccis - shave   Tubular adenoma of colon     Past Surgical History:  Procedure Laterality Date   ANKLE SURGERY     CHOLECYSTECTOMY     CHOLECYSTECTOMY     LEFT HEART CATH AND CORONARY ANGIOGRAPHY N/A 02/13/2019   Procedure: LEFT HEART CATH AND CORONARY ANGIOGRAPHY;  Surgeon: Troy Sine, MD;  Location: [facility];  Service: Cardiovascular;  Laterality: N/A;    No Known Allergies  Current Outpatient Medications  Medication Sig Dispense Refill   amLODipine (NORVASC) 2.5 MG tablet TAKE 1 TABLET BY MOUTH EVERY DAY 90 tablet 3   aspirin EC 81 MG tablet Take 1 tablet (81 mg total) by mouth daily. 90 tablet 3   atorvastatin (LIPITOR) 80 MG tablet TAKE 1 TABLET BY MOUTH EVERY DAY 90 tablet 3   benzonatate (TESSALON) 100 MG capsule Take 100 mg by mouth 3 (three) times daily as needed for cough.     chlorpheniramine (CHLOR-TRIMETON) 4 MG tablet Take 8 mg by mouth at bedtime.      finasteride (PROSCAR) 5 MG tablet Take 1 tablet (5 mg total) by mouth  daily. 90 tablet 3   furosemide (LASIX) 20 MG tablet TAKE 1 TABLET BY MOUTH EVERY DAY 90 tablet 0   hydrochlorothiazide (MICROZIDE) 12.5 MG capsule Take 12.5 mg by mouth daily as needed (fluid/swelling).     HYDROcodone-homatropine (HYCODAN) 5-1.5 MG/5ML syrup Take 5 mLs by mouth every 8 (eight) hours as needed for cough. 120 mL 0   hydrocortisone 2.5 % cream Apply topically as directed. Apply to scaly rash on face hs on Tuesday, Thursday, and Saturday 30 g 3   lidocaine (LIDODERM) 5 % Place 2 patches onto the skin daily. Remove &  Discard patch within 12 hours or as directed by MD 30 patch 0   Melatonin 5 MG TABS Take 10 mg by mouth at bedtime.      metoprolol tartrate (LOPRESSOR) 25 MG tablet Take 1 tablet (25 mg total) by mouth 2 (two) times daily. 180 tablet 2   mometasone (ELOCON) 0.1 % cream APPLY TO AFFECTED AREA TWICE A DAY 45 g 0   Multiple Vitamin (MULTIVITAMIN WITH MINERALS) TABS tablet Take 1 tablet by mouth in the morning and at bedtime.      pantoprazole (PROTONIX) 40 MG tablet Take 1 tablet (40 mg total) by mouth 2 (two) times daily. 180 tablet 3   pramipexole (MIRAPEX) 0.25 MG tablet TAKE 2 TABLETS (0.5 MG TOTAL) BY MOUTH AT BEDTIME. 180 tablet 1   tadalafil (CIALIS) 5 MG tablet Take 1 tablet (5 mg total) by mouth daily. 90 tablet 2   terbinafine (LAMISIL) 250 MG tablet Take 1 tablet (250 mg total) by mouth daily. 30 tablet 0   terbinafine (LAMISIL) 250 MG tablet Take 1 tablet (250 mg total) by mouth daily. 30 tablet 1   tiZANidine (ZANAFLEX) 4 MG tablet Take 1 tablet (4 mg total) by mouth every 8 (eight) hours as needed for muscle spasms. 30 tablet 1   valACYclovir (VALTREX) 1000 MG tablet Take 1,000 mg by mouth daily as needed (outbreak).     fluticasone (FLONASE) 50 MCG/ACT nasal spray Place 2 sprays into both nostrils daily as needed for allergies or rhinitis. (Patient not taking: Reported on 12/11/2021)     No current facility-administered medications for this visit.     Social History   Socioeconomic History   Marital status: Married    Spouse name: Not on file   Number of children: Not on file   Years of education: Not on file   Highest education level: Not on file  Occupational History   Occupation: RETIRED  Tobacco Use   Smoking status: Former    Packs/day: 0.50    Years: 15.00    Pack years: 7.50    Types: Cigarettes    Quit date: 10/01/1988    Years since quitting: 33.2   Smokeless tobacco: Never  Substance and Sexual Activity   Alcohol use: No    Alcohol/week: 0.0 standard drinks   Drug use: No   Sexual activity: Yes    Comment: married, 2 grown sons, retired maintenance man  Other Topics Concern   Not on file  Social History Narrative   Not on file   Social Determinants of Health   Financial Resource Strain: Not on file  Food Insecurity: Not on file  Transportation Needs: Not on file  Physical Activity: Not on file  Stress: Not on file  Social Connections: Not on file  Intimate Partner Violence: Not on file   Additional social history is notable that he is married for 44 years.  He has 2 children and 4 grandchildren.  He previously worked as a Conservation officer, nature gentleman.  He remotely smoked but quit in 1990.  He is not routinely exercise.  Family History  Problem Relation Age of Onset   Diabetes Other    Breast cancer Other    Coronary artery disease Other    Family history is notable that his mother died at 17 with cancer.  Father had heart disease and diabetes mellitus.  He has 3 brothers, one had heart valve replacement in 2017 and another had undergone CABG revascularization surgery.  He has 2 living  sisters and one sister died in an accident.  ROS General: Negative; No fevers, chills, or night sweats HEENT: Negative; No changes in vision or hearing, sinus congestion, difficulty swallowing Pulmonary: Negative; No cough, wheezing, shortness of breath, hemoptysis Cardiovascular:  See HPI; No chest pain,  presyncope, syncope, palpitations, edema GI: Negative; No nausea, vomiting, diarrhea, or abdominal pain GU: Negative; No dysuria, hematuria, or difficulty voiding Musculoskeletal: Arthritic issues, knees/shoulders Hematologic/Oncologic: Negative; no easy bruising, bleeding Endocrine: Negative; no heat/cold intolerance; no diabetes Neuro: Negative; no changes in balance, headaches Skin: Negative; No rashes or skin lesions Psychiatric: Negative; No behavioral problems, depression Sleep: Positive for obstructive sleep apnea on his third BiPAP machine.  No daytime sleepiness, hypersomnolence, bruxism, restless legs, hypnogagnic hallucinations Other comprehensive 14 point system review is negative   Physical Exam BP 128/66    Pulse (!) 57    Ht '6\' 3"'  (1.905 m)    Wt 268 lb 9.6 oz (121.8 kg)    SpO2 93%    BMI 33.57 kg/m    Repeat blood pressure by me 128/64 supine and 130/64 standing   Wt Readings from Last 3 Encounters:  12/11/21 268 lb 9.6 oz (121.8 kg)  12/14/20 256 lb (116.1 kg)  11/21/20 256 lb 4.8 oz (116.3 kg)      Physical Exam BP 128/66    Pulse (!) 57    Ht '6\' 3"'  (1.905 m)    Wt 268 lb 9.6 oz (121.8 kg)    SpO2 93%    BMI 33.57 kg/m  General: Alert, oriented, no distress.  Skin: normal turgor, no rashes, warm and dry HEENT: Normocephalic, atraumatic. Pupils equal round and reactive to light; sclera anicteric; extraocular muscles intact; Fundi ** Nose without nasal septal hypertrophy Mouth/Parynx benign; Mallinpatti scale Neck: No JVD, no carotid bruits; normal carotid upstroke Lungs: clear to ausculatation and percussion; no wheezing or rales Chest wall: without tenderness to palpitation Heart: PMI not displaced, RRR, s1 s2 normal, 1/6 systolic murmur, no diastolic murmur, no rubs, gallops, thrills, or heaves Abdomen: soft, nontender; no hepatosplenomehaly, BS+; abdominal aorta nontender and not dilated by palpation. Back: no CVA tenderness Pulses 2+ Musculoskeletal:  full range of motion, normal strength, no joint deformities Extremities: no clubbing cyanosis or edema, Homan's sign negative  Neurologic: grossly nonfocal; Cranial nerves grossly wnl Psychologic: Normal mood and affect    General: Alert, oriented, no distress.  Skin: normal turgor, no rashes, warm and dry HEENT: Normocephalic, atraumatic. Pupils equal round and reactive to light; sclera anicteric; extraocular muscles intact;  Nose without nasal septal hypertrophy Mouth/Parynx benign; Mallinpatti scale 3 Neck: No JVD, no carotid bruits; normal carotid upstroke Lungs: clear to ausculatation and percussion; no wheezing or rales Chest wall: without tenderness to palpitation Heart: PMI not displaced, RRR, s1 s2 normal, 1/6 systolic murmur, no diastolic murmur, no rubs, gallops, thrills, or heaves Abdomen: soft, nontender; no hepatosplenomehaly, BS+; abdominal aorta nontender and not dilated by palpation. Back: no CVA tenderness Pulses 2+ Musculoskeletal: full range of motion, normal strength, no joint deformities Extremities: no clubbing cyanosis or edema, Homan's sign negative  Neurologic: grossly nonfocal; Cranial nerves grossly wnl Psychologic: Normal mood and affect  December 11, 2021 ECG (independently read by me): Sinus bradycardia at 57, LVH, no ectopy  November 21, 2020 ECG (independently read by me): Sinus bradycardia at 54; LAHB, LVH, no ectopy  August 2020 ECG (independently read by me): Sinus bradycardia 53 bpm.  LVH by voltage criteria.  Left anterior hemiblock.  Normal intervals.  October  2017 ECG (independently read by me): Marked sinus bradycardia at 42 bpm.  No ectopy.  LABS:  BMP Latest Ref Rng & Units 12/08/2020 11/14/2020 04/13/2020  Glucose 65 - 99 mg/dL 86 80 133(H)  BUN 7 - 25 mg/dL '13 12 14  ' Creatinine 0.70 - 1.18 mg/dL 0.91 0.96 0.89  BUN/Creat Ratio 6 - 22 (calc) NOT APPLICABLE 13 -  Sodium 135 - 146 mmol/L 142 142 141  Potassium 3.5 - 5.3 mmol/L 4.3 4.3 4.0   Chloride 98 - 110 mmol/L 109 103 103  CO2 20 - 32 mmol/L '23 24 27  ' Calcium 8.6 - 10.3 mg/dL 8.5(L) 8.9 9.1     Hepatic Function Latest Ref Rng & Units 06/28/2021 12/08/2020 11/14/2020  Total Protein 6.0 - 8.5 g/dL 6.5 6.2 6.6  Albumin 3.7 - 4.7 g/dL 4.2 - 4.2  AST 0 - 40 IU/L '18 14 18  ' ALT 0 - 44 IU/L 13 8(L) 11  Alk Phosphatase 44 - 121 IU/L 125(H) - 107  Total Bilirubin 0.0 - 1.2 mg/dL 0.7 0.8 0.5  Bilirubin, Direct 0.00 - 0.40 mg/dL 0.24 - -    CBC Latest Ref Rng & Units 06/28/2021 12/08/2020 11/14/2020  WBC 3.4 - 10.8 x10E3/uL 8.2 7.1 8.4  Hemoglobin 13.0 - 17.7 g/dL 14.9 14.3 14.7  Hematocrit 37.5 - 51.0 % 44.2 43.8 43.6  Platelets 150 - 450 x10E3/uL 217 188 180   Lab Results  Component Value Date   MCV 91 06/28/2021   MCV 92.8 12/08/2020   MCV 91 11/14/2020   Lab Results  Component Value Date   TSH 3.080 11/14/2020   No results found for: HGBA1C   BNP No results found for: BNP  ProBNP No results found for: PROBNP   Lipid Panel     Component Value Date/Time   CHOL 87 12/08/2020 0843   CHOL 109 11/14/2020 0849   TRIG 76 12/08/2020 0843   HDL 36 (L) 12/08/2020 0843   HDL 39 (L) 11/14/2020 0849   CHOLHDL 2.4 12/08/2020 0843   VLDL 18 02/07/2017 0809   LDLCALC 35 12/08/2020 0843    RADIOLOGY: No results found.    IMPRESSION: No diagnosis found.   ASSESSMENT AND PLAN: 1.  Coronary calcification/CAD: Calcium score 2264 with diffuse coronary calcification and stenosis in the proximal LAD, diagonal, circumflex marginal and RCA.  Definitive cardiac catheterization was performed on Feb 13, 2019 which reveale diffuse multivessel coronary calcification of 50 to 60% in the proximal LAD, diffuse 50% stenosis in the first diagonal branch of the LAD and irregularity of the mid distal LAD without significant stenosis.  The left circumflex vessel had 60% mid distal OM stenosis proximal to the bifurcation and there was mild irregularity with proximal to mid calcification  and a dominant RCA.  Presently, he is not having any anginal symptomatology on his current regimen of amlodipine 2.5 mg, metoprolol tartrate 25 mg twice a day in addition to high potency statin therapy with atorvastatin 80 mg.  2.  Essential hypertension.  Blood pressure is controlled on amlodipine 2.5 mg, HCTZ 12.5 mg, and metoprolol tartrate 25 mg twice daily.  There is resolution of prior lower extremity edema.  3.  Leg edema: Resolved with HCTZ  4.  OSA on BiPAP: Set up date May 13, 2018 with the ResMed AirCurve 10 VAuto unit. I obtained a download from October 20, 2020 through November 18, 2020 which continues to demonstrate excellent compliance with average use at 9 hours 40 minutes.  At a BiPAP  pressure of 18/14, AHI is excellent at 1.0.  There is no mask leak.  5. Hyperlipidemia: On atorvastatin 80 mg.  Most recent laboratory from Feb 11, 2021 total cholesterol 109, LDL 53 triglycerides 83 HDL 39.  6. GERD: Controlled on pantoprazole.  7. Posttraumatic stress disorder: Diagnosed at the [facility] hospital.  Clinically he is doing well.  I will see him in 1 year for follow-up evaluation or sooner as needed.  Troy Sine, MD, [facility] 12/11/2021 8:18 AM

## 2021-12-12 ENCOUNTER — Encounter: Payer: Self-pay | Admitting: Podiatry

## 2021-12-12 ENCOUNTER — Ambulatory Visit (INDEPENDENT_AMBULATORY_CARE_PROVIDER_SITE_OTHER): Payer: Medicare Other | Admitting: Podiatry

## 2021-12-12 DIAGNOSIS — B351 Tinea unguium: Secondary | ICD-10-CM | POA: Diagnosis not present

## 2021-12-12 DIAGNOSIS — M79674 Pain in right toe(s): Secondary | ICD-10-CM | POA: Diagnosis not present

## 2021-12-12 DIAGNOSIS — M79675 Pain in left toe(s): Secondary | ICD-10-CM | POA: Diagnosis not present

## 2021-12-12 DIAGNOSIS — I2510 Atherosclerotic heart disease of native coronary artery without angina pectoris: Secondary | ICD-10-CM | POA: Diagnosis not present

## 2021-12-12 DIAGNOSIS — L608 Other nail disorders: Secondary | ICD-10-CM

## 2021-12-12 DIAGNOSIS — Z20822 Contact with and (suspected) exposure to covid-19: Secondary | ICD-10-CM | POA: Diagnosis not present

## 2021-12-12 NOTE — Progress Notes (Signed)
This patient presents to the office with chief complaint of long thick painful nails.  Patient says the nails are painful walking and wearing shoes.  This patient is unable to self treat.  This patient is unable to trim his  nails since she is unable to reach his nails.  She presents to the office for preventative foot care services. ? ?General Appearance  Alert, conversant and in no acute stress. ? ?Vascular  Dorsalis pedis and posterior tibial  pulses are absent   bilaterally.  Capillary return is within normal limits  bilaterally. Temperature is within normal limits  bilaterally. ? ?Neurologic  Senn-Weinstein monofilament wire test within normal limits  bilaterally. Muscle power within normal limits bilaterally. ? ?Nails Thick disfigured discolored nails with subungual debris hallux nails . No evidence of bacterial infection or drainage bilaterally. ? ?Orthopedic  No limitations of motion  feet .  No crepitus or effusions noted.  No bony pathology or digital deformities noted. Mild HAV  B/L.  Hammer toes with deviated toe 3,4  B/L.  Prominent metsatarsal heads.  Cavus foot type  B/L. ? ?Skin  normotropic skin with no porokeratosis noted bilaterally.  No signs of infections or ulcers noted.    ? ?Onychomycosis  Nails  B/L.  Pain in right toes  Pain in left toes ? ?Debridement of nails both feet followed trimming the nails with dremel tool. Told him to try spenco 3/4 orthoses for painful feet  B/L.   RTC 3 months. ? ? ?Gardiner Barefoot DPM   ?

## 2021-12-13 ENCOUNTER — Encounter: Payer: Self-pay | Admitting: Cardiovascular Disease

## 2021-12-13 DIAGNOSIS — R42 Dizziness and giddiness: Secondary | ICD-10-CM | POA: Diagnosis not present

## 2021-12-13 DIAGNOSIS — R292 Abnormal reflex: Secondary | ICD-10-CM | POA: Diagnosis not present

## 2021-12-13 DIAGNOSIS — R29810 Facial weakness: Secondary | ICD-10-CM | POA: Diagnosis not present

## 2021-12-13 DIAGNOSIS — R251 Tremor, unspecified: Secondary | ICD-10-CM | POA: Diagnosis not present

## 2021-12-13 DIAGNOSIS — G2581 Restless legs syndrome: Secondary | ICD-10-CM | POA: Diagnosis not present

## 2021-12-14 ENCOUNTER — Telehealth: Payer: Self-pay | Admitting: Family Medicine

## 2021-12-14 ENCOUNTER — Other Ambulatory Visit: Payer: Medicare Other

## 2021-12-14 DIAGNOSIS — Q667 Congenital pes cavus, unspecified foot: Secondary | ICD-10-CM | POA: Insufficient documentation

## 2021-12-14 NOTE — Telephone Encounter (Signed)
Patient has an upcoming Lab appointment on 12/15/21. He spoke with Morey Hummingbird (health Nurse advisor scheduler) and asked that we add the following lab orders. Per the request of  [facility] Phone # [phone] and fax [phone] he needs Ferritin, iron panel, Magnesium panel, and CBC with auto differential ( 5 parts), bmp, dx: G25.81 restless legs. She states that patient was seen as a referral from Dr. Tami Ribas and they are wanting to do a Head MRI without contrast they have already put in the order for that. ?

## 2021-12-15 ENCOUNTER — Ambulatory Visit: Payer: Medicare Other

## 2021-12-15 ENCOUNTER — Other Ambulatory Visit: Payer: Self-pay

## 2021-12-15 ENCOUNTER — Other Ambulatory Visit: Payer: Medicare Other

## 2021-12-15 ENCOUNTER — Other Ambulatory Visit: Payer: Self-pay | Admitting: Family Medicine

## 2021-12-15 DIAGNOSIS — N400 Enlarged prostate without lower urinary tract symptoms: Secondary | ICD-10-CM | POA: Diagnosis not present

## 2021-12-15 DIAGNOSIS — I2510 Atherosclerotic heart disease of native coronary artery without angina pectoris: Secondary | ICD-10-CM | POA: Diagnosis not present

## 2021-12-15 DIAGNOSIS — E785 Hyperlipidemia, unspecified: Secondary | ICD-10-CM

## 2021-12-15 DIAGNOSIS — Z125 Encounter for screening for malignant neoplasm of prostate: Secondary | ICD-10-CM

## 2021-12-15 DIAGNOSIS — G2581 Restless legs syndrome: Secondary | ICD-10-CM

## 2021-12-15 DIAGNOSIS — Z0000 Encounter for general adult medical examination without abnormal findings: Secondary | ICD-10-CM

## 2021-12-15 DIAGNOSIS — I10 Essential (primary) hypertension: Secondary | ICD-10-CM

## 2021-12-15 DIAGNOSIS — E611 Iron deficiency: Secondary | ICD-10-CM | POA: Diagnosis not present

## 2021-12-15 DIAGNOSIS — R351 Nocturia: Secondary | ICD-10-CM

## 2021-12-16 LAB — COMPLETE METABOLIC PANEL WITH GFR
AG Ratio: 1.7 (calc) (ref 1.0–2.5)
ALT: 17 U/L (ref 9–46)
AST: 24 U/L (ref 10–35)
Albumin: 3.9 g/dL (ref 3.6–5.1)
Alkaline phosphatase (APISO): 88 U/L (ref 35–144)
BUN: 12 mg/dL (ref 7–25)
CO2: 26 mmol/L (ref 20–32)
Calcium: 9 mg/dL (ref 8.6–10.3)
Chloride: 109 mmol/L (ref 98–110)
Creat: 0.94 mg/dL (ref 0.70–1.28)
Globulin: 2.3 g/dL (calc) (ref 1.9–3.7)
Glucose, Bld: 88 mg/dL (ref 65–99)
Potassium: 4 mmol/L (ref 3.5–5.3)
Sodium: 145 mmol/L (ref 135–146)
Total Bilirubin: 0.7 mg/dL (ref 0.2–1.2)
Total Protein: 6.2 g/dL (ref 6.1–8.1)
eGFR: 85 mL/min/{1.73_m2} (ref >=60)

## 2021-12-16 LAB — CBC WITH DIFFERENTIAL/PLATELET
Absolute Monocytes: 504 cells/uL (ref 200–950)
Basophils Absolute: 28 cells/uL (ref 0–200)
Basophils Relative: 0.4 %
Eosinophils Absolute: 270 cells/uL (ref 15–500)
Eosinophils Relative: 3.8 %
HCT: 44.8 % (ref 38.5–50.0)
Hemoglobin: 14.7 g/dL (ref 13.2–17.1)
Lymphs Abs: 916 cells/uL (ref 850–3900)
MCH: 29.8 pg (ref 27.0–33.0)
MCHC: 32.8 g/dL (ref 32.0–36.0)
MCV: 90.9 fL (ref 80.0–100.0)
MPV: 9.7 fL (ref 7.5–12.5)
Monocytes Relative: 7.1 %
Neutro Abs: 5382 cells/uL (ref 1500–7800)
Neutrophils Relative %: 75.8 %
Platelets: 185 10*3/uL (ref 140–400)
RBC: 4.93 10*6/uL (ref 4.20–5.80)
RDW: 13.3 % (ref 11.0–15.0)
Total Lymphocyte: 12.9 %
WBC: 7.1 10*3/uL (ref 3.8–10.8)

## 2021-12-16 LAB — LIPID PANEL
Cholesterol: 93 mg/dL (ref <200)
HDL: 34 mg/dL — ABNORMAL LOW (ref >=40)
LDL Cholesterol (Calc): 43 mg/dL (calc)
Non-HDL Cholesterol (Calc): 59 mg/dL (calc) (ref <130)
Total CHOL/HDL Ratio: 2.7 (calc) (ref <5.0)
Triglycerides: 84 mg/dL (ref <150)

## 2021-12-16 LAB — MAGNESIUM: Magnesium: 2 mg/dL (ref 1.5–2.5)

## 2021-12-16 LAB — IRON,TIBC AND FERRITIN PANEL
%SAT: 21 % (calc) (ref 20–48)
Ferritin: 21 ng/mL — ABNORMAL LOW (ref 24–380)
Iron: 54 ug/dL (ref 50–180)
TIBC: 258 mcg/dL (calc) (ref 250–425)

## 2021-12-16 LAB — PSA: PSA: 0.4 ng/mL (ref <=4.00)

## 2021-12-19 ENCOUNTER — Other Ambulatory Visit (HOSPITAL_COMMUNITY): Payer: Self-pay | Admitting: Neurology

## 2021-12-19 ENCOUNTER — Other Ambulatory Visit: Payer: Self-pay | Admitting: Neurology

## 2021-12-19 ENCOUNTER — Ambulatory Visit (INDEPENDENT_AMBULATORY_CARE_PROVIDER_SITE_OTHER): Payer: Medicare Other | Admitting: Family Medicine

## 2021-12-19 ENCOUNTER — Other Ambulatory Visit: Payer: Self-pay

## 2021-12-19 VITALS — BP 130/70 | HR 59 | Temp 97.3°F | Ht 75.0 in | Wt 271.8 lb

## 2021-12-19 DIAGNOSIS — E785 Hyperlipidemia, unspecified: Secondary | ICD-10-CM

## 2021-12-19 DIAGNOSIS — Z0001 Encounter for general adult medical examination with abnormal findings: Secondary | ICD-10-CM | POA: Diagnosis not present

## 2021-12-19 DIAGNOSIS — I2510 Atherosclerotic heart disease of native coronary artery without angina pectoris: Secondary | ICD-10-CM

## 2021-12-19 DIAGNOSIS — R42 Dizziness and giddiness: Secondary | ICD-10-CM

## 2021-12-19 DIAGNOSIS — I10 Essential (primary) hypertension: Secondary | ICD-10-CM

## 2021-12-19 DIAGNOSIS — Z1211 Encounter for screening for malignant neoplasm of colon: Secondary | ICD-10-CM | POA: Diagnosis not present

## 2021-12-19 DIAGNOSIS — Z0000 Encounter for general adult medical examination without abnormal findings: Secondary | ICD-10-CM

## 2021-12-19 DIAGNOSIS — N400 Enlarged prostate without lower urinary tract symptoms: Secondary | ICD-10-CM

## 2021-12-19 DIAGNOSIS — J431 Panlobular emphysema: Secondary | ICD-10-CM | POA: Diagnosis not present

## 2021-12-19 DIAGNOSIS — Z125 Encounter for screening for malignant neoplasm of prostate: Secondary | ICD-10-CM | POA: Diagnosis not present

## 2021-12-19 DIAGNOSIS — S1095XA Superficial foreign body of unspecified part of neck, initial encounter: Secondary | ICD-10-CM

## 2021-12-19 DIAGNOSIS — R29810 Facial weakness: Secondary | ICD-10-CM

## 2021-12-19 DIAGNOSIS — R292 Abnormal reflex: Secondary | ICD-10-CM

## 2021-12-19 MED ORDER — TIZANIDINE HCL 4 MG PO TABS: 4.0000 mg | ORAL_TABLET | Freq: Three times a day (TID) | ORAL | 1 refills | Status: DC | PRN

## 2021-12-19 NOTE — Progress Notes (Signed)
? ?Subjective:  ? ? Patient ID: Gregory Suarez, male    DOB: [birth year], 76 y.o.   MRN: [id number] ? ?HPI  ?Patient is a very pleasant 76 year old Caucasian gentleman here today for an annual wellness visit.  Gregory Suarez is due for colonoscopy.  His last colonoscopy was in 2018 and they recommended a repeat colonoscopy in 5 years.  Gregory Suarez does not want to do a colonoscopy but Gregory Suarez does want to try to do the Cologuard.  Gregory Suarez has a history of tubular adenomas and we discussed that this is not indicated however the patient would prefer to do the Cologuard and request that I schedule this for him.  His most recent PSA on his prostate was excellent at 0.4.  Gregory Suarez quit smoking more than 15 years ago so Gregory Suarez does not require lung cancer screening.  Gregory Suarez does complain of restless leg syndrome.  Has been having burning and pain in his legs at night that goes away only if Gregory Suarez moves his legs.  Gregory Suarez has been seeing a neurologist who requested that we check an iron panel.  His ferritin was low so we have recommended that Gregory Suarez start ferrous sulfate 325 mg a day to see if this will help with the restless leg syndrome.  Otherwise Gregory Suarez is doing well with no concerns. ?Immunization History  ?Administered Date(s) Administered  ? Fluad Quad(high Dose 65+) 07/23/2019  ? Influenza, High Dose Seasonal PF 07/24/2018  ? PFIZER(Purple Top)SARS-COV-2 Vaccination 10/27/2019, 11/17/2019, 07/26/2020  ? Pneumococcal Conjugate-13 01/16/2014  ? Pneumococcal Polysaccharide-23 06/01/2010, 03/02/2011, 04/01/2011, 02/04/2015, 08/15/2020  ? Td 03/31/2009  ? Tdap 05/24/2009, 10/01/2010  ? Zoster Recombinat (Shingrix) 08/15/2020, 10/20/2020  ? Zoster, Live 07/06/2011, 03/01/2014  ? ? ?Appointment on 12/15/2021  ?Component Date Value Ref Range Status  ? PSA 12/15/2021 0.40  < OR = 4.00 ng/mL Final  ? Comment: The total PSA value from this assay system is  ?standardized against the WHO standard. The test  ?result will be approximately 20% lower when compared  ?to the equimolar-standardized  total PSA (Beckman  ?Coulter). Comparison of serial PSA results should be  ?interpreted with this fact in mind. ?. ?This test was performed using the Siemens  ?chemiluminescent method. Values obtained from  ?different assay methods cannot be used ?interchangeably. PSA levels, regardless of ?value, should not be interpreted as absolute ?evidence of the presence or absence of disease. ?  ? Magnesium 12/15/2021 2.0  1.5 - 2.5 mg/dL Final  ? Glucose, Bld 12/15/2021 88  65 - 99 mg/dL Final  ? Comment: . ?           Fasting reference interval ?. ?  ? BUN 12/15/2021 12  7 - 25 mg/dL Final  ? Creat 12/15/2021 0.94  0.70 - 1.28 mg/dL Final  ? eGFR 12/15/2021 85  > OR = 60 mL/min/1.3m Final  ? Comment: The eGFR is based on the CKD-EPI 2021 equation. To calculate  ?the new eGFR from a previous Creatinine or Cystatin C ?result, go to https://www.kidney.org/professionals/ ?kdoqi/gfr%5Fcalculator ?  ? BUN/Creatinine Ratio 099/35/7017NOT APPLICABLE  6 - 22 (calc) Final  ? Sodium 12/15/2021 145  135 - 146 mmol/L Final  ? Potassium 12/15/2021 4.0  3.5 - 5.3 mmol/L Final  ? Chloride 12/15/2021 109  98 - 110 mmol/L Final  ? CO2 12/15/2021 26  20 - 32 mmol/L Final  ? Calcium 12/15/2021 9.0  8.6 - 10.3 mg/dL Final  ? Total Protein 12/15/2021 6.2  6.1 - 8.1 g/dL Final  ? Albumin  12/15/2021 3.9  3.6 - 5.1 g/dL Final  ? Globulin 12/15/2021 2.3  1.9 - 3.7 g/dL (calc) Final  ? AG Ratio 12/15/2021 1.7  1.0 - 2.5 (calc) Final  ? Total Bilirubin 12/15/2021 0.7  0.2 - 1.2 mg/dL Final  ? Alkaline phosphatase (APISO) 12/15/2021 88  35 - 144 U/L Final  ? AST 12/15/2021 24  10 - 35 U/L Final  ? ALT 12/15/2021 17  9 - 46 U/L Final  ? WBC 12/15/2021 7.1  3.8 - 10.8 Thousand/uL Final  ? RBC 12/15/2021 4.93  4.20 - 5.80 Million/uL Final  ? Hemoglobin 12/15/2021 14.7  13.2 - 17.1 g/dL Final  ? HCT 12/15/2021 44.8  38.5 - 50.0 % Final  ? MCV 12/15/2021 90.9  80.0 - 100.0 fL Final  ? MCH 12/15/2021 29.8  27.0 - 33.0 pg Final  ? MCHC 12/15/2021 32.8  32.0  - 36.0 g/dL Final  ? RDW 12/15/2021 13.3  11.0 - 15.0 % Final  ? Platelets 12/15/2021 185  140 - 400 Thousand/uL Final  ? MPV 12/15/2021 9.7  7.5 - 12.5 fL Final  ? Neutro Abs 12/15/2021 5,382  1,500 - 7,800 cells/uL Final  ? Lymphs Abs 12/15/2021 916  850 - 3,900 cells/uL Final  ? Absolute Monocytes 12/15/2021 504  200 - 950 cells/uL Final  ? Eosinophils Absolute 12/15/2021 270  15 - 500 cells/uL Final  ? Basophils Absolute 12/15/2021 28  0 - 200 cells/uL Final  ? Neutrophils Relative % 12/15/2021 75.8  % Final  ? Total Lymphocyte 12/15/2021 12.9  % Final  ? Monocytes Relative 12/15/2021 7.1  % Final  ? Eosinophils Relative 12/15/2021 3.8  % Final  ? Basophils Relative 12/15/2021 0.4  % Final  ? Cholesterol 12/15/2021 93  <200 mg/dL Final  ? HDL 12/15/2021 34 (L)  > OR = 40 mg/dL Final  ? Triglycerides 12/15/2021 84  <150 mg/dL Final  ? LDL Cholesterol (Calc) 12/15/2021 43  mg/dL (calc) Final  ? Comment: Reference range: <100 ?Marland Kitchen ?Desirable range <100 mg/dL for primary prevention;   ?<70 mg/dL for patients with CHD or diabetic patients  ?with > or = 2 CHD risk factors. ?. ?LDL-C is now calculated using the Martin-Hopkins  ?calculation, which is a validated novel method providing  ?better accuracy than the Friedewald equation in the  ?estimation of LDL-C.  ?Cresenciano Genre et al. Annamaria Helling. 6803;212(24): 2061-2068  ?(http://education.[employer].com/faq/FAQ164) ?  ? Total CHOL/HDL Ratio 12/15/2021 2.7  <5.0 (calc) Final  ? Non-HDL Cholesterol (Calc) 12/15/2021 59  <130 mg/dL (calc) Final  ? Comment: For patients with diabetes plus 1 major ASCVD risk  ?factor, treating to a non-HDL-C goal of <100 mg/dL  ?(LDL-C of <70 mg/dL) is considered a therapeutic  ?option. ?  ? Iron 12/15/2021 54  50 - 180 mcg/dL Final  ? TIBC 12/15/2021 258  250 - 425 mcg/dL (calc) Final  ? %SAT 12/15/2021 21  20 - 48 % (calc) Final  ? Ferritin 12/15/2021 21 (L)  24 - 380 ng/mL Final  ? ? ?Past Medical History:  ?Diagnosis Date  ? Basal cell  carcinoma 06/28/2015  ? left temple - excised  ? Basal cell carcinoma 08/05/2017  ? right cheek inf to zygoma  ? Basal cell carcinoma 05/09/2020  ? right prox mandible  ? CAD (coronary artery disease)   ? Elevated PSA   ? Erectile dysfunction   ? GERD (gastroesophageal reflux disease)   ? Hyperlipidemia   ? Hypertension   ? Mitral valve regurgitation   ?  OSA treated with BiPAP   ? Pneumothorax   ? ultimatley required talc pleurodiesis (2021)  ? RLS (restless legs syndrome)   ? 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 right temple : sccis - shave  ? Tubular adenoma of colon   ? ?Past Surgical History:  ?Procedure Laterality Date  ? ANKLE SURGERY    ? CHOLECYSTECTOMY    ? CHOLECYSTECTOMY    ? LEFT HEART CATH AND CORONARY ANGIOGRAPHY N/A 02/13/2019  ? Procedure: LEFT HEART CATH AND CORONARY ANGIOGRAPHY;  Surgeon: Troy Sine, MD;  Location: [facility];  Service: Cardiovascular;  Laterality: N/A;  ? ?Current Outpatient Medications on File Prior to Visit  ?Medication Sig Dispense Refill  ? amLODipine (NORVASC) 2.5 MG tablet TAKE 1 TABLET BY MOUTH EVERY DAY 90 tablet 3  ? aspirin EC 81 MG tablet Take 1 tablet (81 mg total) by mouth daily. 90 tablet 3  ? atorvastatin (LIPITOR) 80 MG tablet TAKE 1 TABLET BY MOUTH EVERY DAY 90 tablet 3  ? benzonatate (TESSALON) 100 MG capsule Take 100 mg by mouth 3 (three) times daily as needed for cough.    ? chlorpheniramine (CHLOR-TRIMETON) 4 MG tablet Take 8 mg by mouth at bedtime.     ? finasteride (PROSCAR) 5 MG tablet Take 1 tablet (5 mg total) by mouth daily. 90 tablet 3  ? hydrochlorothiazide (MICROZIDE) 12.5 MG capsule Take 12.5 mg by mouth daily as needed (fluid/swelling).    ? HYDROcodone-homatropine (HYCODAN) 5-1.5 MG/5ML syrup Take 5 mLs by mouth every 8 (eight) hours as needed for cough. 120 mL 0  ? hydrocortisone 2.5 % cream Apply topically as directed. Apply to scaly rash on face hs on Tuesday, Thursday, and Saturday 30 g 3  ? lidocaine (LIDODERM) 5 % Place 2  patches onto the skin daily. Remove & Discard patch within 12 hours or as directed by MD 30 patch 0  ? Melatonin 5 MG TABS Take 10 mg by mouth at bedtime.     ? metoprolol tartrate (LOPRESSOR) 25 MG tablet

## 2021-12-20 DIAGNOSIS — M25561 Pain in right knee: Secondary | ICD-10-CM | POA: Diagnosis not present

## 2021-12-26 NOTE — Telephone Encounter (Signed)
Patient had labs drawn on 12/15/21. ?

## 2021-12-27 ENCOUNTER — Other Ambulatory Visit: Payer: Self-pay

## 2021-12-27 ENCOUNTER — Ambulatory Visit (INDEPENDENT_AMBULATORY_CARE_PROVIDER_SITE_OTHER): Payer: Medicare Other | Admitting: Dermatology

## 2021-12-27 DIAGNOSIS — B353 Tinea pedis: Secondary | ICD-10-CM | POA: Diagnosis not present

## 2021-12-27 DIAGNOSIS — Z1211 Encounter for screening for malignant neoplasm of colon: Secondary | ICD-10-CM | POA: Diagnosis not present

## 2021-12-27 DIAGNOSIS — I2510 Atherosclerotic heart disease of native coronary artery without angina pectoris: Secondary | ICD-10-CM

## 2021-12-27 DIAGNOSIS — L219 Seborrheic dermatitis, unspecified: Secondary | ICD-10-CM

## 2021-12-27 LAB — COLOGUARD

## 2021-12-27 MED ORDER — HYDROCORTISONE 2.5 % EX CREA: TOPICAL_CREAM | CUTANEOUS | 6 refills | Status: AC

## 2021-12-27 MED ORDER — KETOCONAZOLE 2 % EX CREA: 1.0000 "application " | TOPICAL_CREAM | Freq: Every day | CUTANEOUS | 11 refills | Status: AC

## 2021-12-27 MED ORDER — KETOCONAZOLE 2 % EX CREA: 1.0000 "application " | TOPICAL_CREAM | Freq: Every day | CUTANEOUS | 11 refills | Status: DC

## 2021-12-27 NOTE — Progress Notes (Signed)
? ?  Follow-Up Visit ?  ?Subjective  ?Gregory Suarez is a 76 y.o. male who presents for the following: Tinea Pedis (Bil feet, 50mf/u total of 468mf Lamisil '250mg'$ , pt did not take the last month). ?He is also follow-up for seborrheic dermatitis of the face.  He is doing better on current treatment regimen with the 2 creams. ? ?The following portions of the chart were reviewed this encounter and updated as appropriate:  ? Tobacco  Allergies  Meds  Problems  Med Hx  Surg Hx  Fam Hx   ?  ?Review of Systems:  No other skin or systemic complaints except as noted in HPI or Assessment and Plan. ? ?Objective  ?Well appearing patient in no apparent distress; mood and affect are within normal limits. ? ?A focused examination was performed including lower extremities, including the legs, feet, toes, and toenails. Relevant physical exam findings are noted in the Assessment and Plan. ? ?bil feet ?Mild peeling heels ? ?Head - Anterior (Face) ?Mild erythema and scale face ? ? ?Assessment & Plan  ?Tinea pedis of both feet ?bil feet ?Chronic and persistent condition with duration or expected duration over one year. Condition is symptomatic / bothersome to patient. Not to goal -but improving. ? ?Improved from oral Lamisil x 4 months, but not resolved. ? ?Start Ketoconazole 2% cr qhs x 1 month ? ?Related Medications ?ketoconazole (NIZORAL) 2 % cream ?Apply 1 application. topically daily. Qhs to feet for 1 month, Apply to scaly areas on face 3 times weekly, Monday, Wednesday and Friday ? ?Seborrheic dermatitis ?Head - Anterior (Face) ? ?Seborrheic Dermatitis  ?-  is a chronic persistent rash characterized by pinkness and scaling most commonly of the mid face but also can occur on the scalp (dandruff), ears; mid chest, mid back and groin.  It tends to be exacerbated by stress and cooler weather.  People who have neurologic disease may experience new onset or exacerbation of existing seborrheic dermatitis.  The condition is not  curable but treatable and can be controlled. ? ?Cont Ketoconazole 2% cr 3x/wk to face, Monday, Wednesday, Friday ?Cont HC 2.5% cr 3x/wk Tuesday, Thursday and Saturday ? ?Related Medications ?hydrocortisone 2.5 % cream ?Apply topically as directed. Apply to scaly rash on face hs on Tuesday, Thursday, and Saturday ? ?Return in about 1 year (around 12/28/2022) for TBSE, Hx of BCC. ? ?I, SoOthelia PullingRMA, am acting as scribe for DaSarina SerMD . ?Documentation: I have reviewed the above documentation for accuracy and completeness, and I agree with the above. ? ?DaSarina SerMD ? ?

## 2021-12-27 NOTE — Patient Instructions (Addendum)
Start Ketoconazole 2% cream nightly to feet for 1 month. ? ? ? ?If You Need Anything After Your Visit ? ?If you have any questions or concerns for your doctor, please call our main line at [phone] and press option 4 to reach your doctor's medical assistant. If no one answers, please leave a voicemail as directed and we will return your call as soon as possible. Messages left after 4 pm will be answered the following business day.  ? ?You may also send [place] a message via MyChart. We typically respond to MyChart messages within 1-2 business days. ? ?For prescription refills, please ask your pharmacy to contact our office. Our fax number is [phone]. ? ?If you have an urgent issue when the clinic is closed that cannot wait until the next business day, you can page your doctor at the number below.   ? ?Please note that while we do our best to be available for urgent issues outside of office hours, we are not available 24/7.  ? ?If you have an urgent issue and are unable to reach [place], you may choose to seek medical care at your doctor's office, retail clinic, urgent care center, or emergency room. ? ?If you have a medical emergency, please immediately call 911 or go to the emergency department. ? ?Pager Numbers ? ?- Dr. Nehemiah Massed: [phone] ? ?- Dr. Laurence Ferrari: [phone] ? ?- Dr. Nicole Kindred: [phone] ? ?In the event of inclement weather, please call our main line at [phone] for an update on the status of any delays or closures. ? ?Dermatology Medication Tips: ?Please keep the boxes that topical medications come in in order to help keep track of the instructions about where and how to use these. Pharmacies typically print the medication instructions only on the boxes and not directly on the medication tubes.  ? ?If your medication is too expensive, please contact our office at [phone] option 4 or send [place] a message through [place].  ? ?We are unable to tell what your co-pay for medications will be in  advance as this is different depending on your insurance coverage. However, we may be able to find a substitute medication at lower cost or fill out paperwork to get insurance to cover a needed medication.  ? ?If a prior authorization is required to get your medication covered by your insurance company, please allow [place] 1-2 business days to complete this process. ? ?Drug prices often vary depending on where the prescription is filled and some pharmacies may offer cheaper prices. ? ?The website www.goodrx.com contains coupons for medications through different pharmacies. The prices here do not account for what the cost may be with help from insurance (it may be cheaper with your insurance), but the website can give you the price if you did not use any insurance.  ?- You can print the associated coupon and take it with your prescription to the pharmacy.  ?- You may also stop by our office during regular business hours and pick up a GoodRx coupon card.  ?- If you need your prescription sent electronically to a different pharmacy, notify our office through [facility] or by phone at [phone] option 4. ? ? ? ? ?Si Usted Necesita Algo Despu?s de Su Visita ? ?Tambi?n puede enviarnos un mensaje a trav?s de MyChart. Por lo general respondemos a los mensajes de MyChart en el transcurso de 1 a 2 d?as h?biles. ? ?Para renovar recetas, por favor pida a su farmacia que se ponga en contacto  con nuestra oficina. Nuestro n?mero de fax es el [phone]. ? ?Si tiene un asunto urgente cuando la cl?nica est? cerrada y que no puede esperar hasta el siguiente d?a h?bil, puede llamar/localizar a su doctor(a) al n?mero que aparece a continuaci?n.  ? ?Por favor, tenga en cuenta que aunque hacemos todo lo posible para estar disponibles para asuntos urgentes fuera del horario de oficina, no estamos disponibles las 24 horas del d?a, los 7 d?as de la semana.  ? ?Si tiene un problema urgente y no puede comunicarse con nosotros, puede  optar por buscar atenci?n m?dica  en el consultorio de su doctor(a), en una cl?nica privada, en un centro de atenci?n urgente o en una sala de emergencias. ? ?Si tiene Engineer, maintenance (IT) m?dica, por favor llame inmediatamente al 911 o vaya a la sala de emergencias. ? ?N?meros de b?per ? ?- Dr. Nehemiah Massed: [phone] ? ?- Dra. Moye: [phone] ? ?- Dra. Nicole Kindred: [phone] ? ?En caso de inclemencias del tiempo, por favor llame a nuestra l?nea principal al [phone] para una actualizaci?n sobre el estado de cualquier retraso o cierre. ? ?Consejos para la medicaci?n en dermatolog?a: ?Por favor, guarde las cajas en las que vienen los medicamentos de uso t?pico para ayudarle a seguir las instrucciones sobre d?nde y c?mo usarlos. Las farmacias generalmente imprimen las instrucciones del medicamento s?lo en las cajas y no directamente en los tubos del [place].  ? ?Si su medicamento es muy caro, por favor, p?ngase en contacto con Zigmund Daniel llamando al [phone] y presione la opci?n 4 o env?enos un mensaje a trav?s de MyChart.  ? ?No podemos decirle cu?l ser? su copago por los medicamentos por adelantado ya que esto es diferente dependiendo de la cobertura de su seguro. Sin embargo, es posible que podamos encontrar un medicamento sustituto a Electrical engineer un formulario para que el seguro cubra el medicamento que se considera necesario.  ? ?Si se requiere Ardelia Mems autorizaci?n previa para que su compa??a de seguros [place] su medicamento, por favor perm?tanos de 1 a 2 d?as h?biles para completar este proceso. ? ?Los precios de los medicamentos var?an con frecuencia dependiendo del Environmental consultant de d?nde se surte la receta y alguna farmacias pueden ofrecer precios m?s baratos. ? ?El sitio web www.goodrx.com tiene cupones para medicamentos de Airline pilot. Los precios aqu? no tienen en cuenta lo que podr?a costar con la ayuda del seguro (puede ser m?s barato con su seguro), pero el sitio web puede darle el  precio si no utiliz? ning?n seguro.  ?- Puede imprimir el cup?n correspondiente y llevarlo con su receta a la farmacia.  ?- Tambi?n puede pasar por nuestra oficina durante el horario de atenci?n regular y recoger una tarjeta de cupones de GoodRx.  ?- Si necesita que su receta se env?e electr?nicamente a Chiropodist, informe a nuestra oficina a trav?s de MyChart de [place] o por tel?fono llamando al [phone] y presione la opci?n 4.  ?

## 2021-12-28 ENCOUNTER — Encounter: Payer: Self-pay | Admitting: Dermatology

## 2021-12-28 DIAGNOSIS — M9903 Segmental and somatic dysfunction of lumbar region: Secondary | ICD-10-CM | POA: Diagnosis not present

## 2021-12-28 DIAGNOSIS — M461 Sacroiliitis, not elsewhere classified: Secondary | ICD-10-CM | POA: Diagnosis not present

## 2021-12-28 DIAGNOSIS — M5441 Lumbago with sciatica, right side: Secondary | ICD-10-CM | POA: Diagnosis not present

## 2021-12-28 DIAGNOSIS — M9904 Segmental and somatic dysfunction of sacral region: Secondary | ICD-10-CM | POA: Diagnosis not present

## 2022-01-01 DIAGNOSIS — M461 Sacroiliitis, not elsewhere classified: Secondary | ICD-10-CM | POA: Diagnosis not present

## 2022-01-01 DIAGNOSIS — M9904 Segmental and somatic dysfunction of sacral region: Secondary | ICD-10-CM | POA: Diagnosis not present

## 2022-01-01 DIAGNOSIS — M9903 Segmental and somatic dysfunction of lumbar region: Secondary | ICD-10-CM | POA: Diagnosis not present

## 2022-01-01 DIAGNOSIS — M5441 Lumbago with sciatica, right side: Secondary | ICD-10-CM | POA: Diagnosis not present

## 2022-01-04 ENCOUNTER — Other Ambulatory Visit: Payer: Self-pay | Admitting: Family Medicine

## 2022-01-04 DIAGNOSIS — R195 Other fecal abnormalities: Secondary | ICD-10-CM

## 2022-01-04 DIAGNOSIS — M9903 Segmental and somatic dysfunction of lumbar region: Secondary | ICD-10-CM | POA: Diagnosis not present

## 2022-01-04 DIAGNOSIS — M5441 Lumbago with sciatica, right side: Secondary | ICD-10-CM | POA: Diagnosis not present

## 2022-01-04 DIAGNOSIS — M9904 Segmental and somatic dysfunction of sacral region: Secondary | ICD-10-CM | POA: Diagnosis not present

## 2022-01-04 DIAGNOSIS — M461 Sacroiliitis, not elsewhere classified: Secondary | ICD-10-CM | POA: Diagnosis not present

## 2022-01-04 LAB — COLOGUARD: COLOGUARD: POSITIVE — AB

## 2022-01-05 ENCOUNTER — Ambulatory Visit (HOSPITAL_COMMUNITY)
Admission: RE | Admit: 2022-01-05 | Discharge: 2022-01-05 | Disposition: A | Discharge status: Home / Self Care | Payer: Medicare Other | Source: Ambulatory Visit | Attending: Neurology | Admitting: Neurology

## 2022-01-05 DIAGNOSIS — H9190 Unspecified hearing loss, unspecified ear: Secondary | ICD-10-CM | POA: Diagnosis not present

## 2022-01-05 DIAGNOSIS — R42 Dizziness and giddiness: Secondary | ICD-10-CM | POA: Insufficient documentation

## 2022-01-05 DIAGNOSIS — S1095XA Superficial foreign body of unspecified part of neck, initial encounter: Secondary | ICD-10-CM

## 2022-01-05 DIAGNOSIS — R292 Abnormal reflex: Secondary | ICD-10-CM | POA: Insufficient documentation

## 2022-01-05 DIAGNOSIS — R29810 Facial weakness: Secondary | ICD-10-CM | POA: Diagnosis not present

## 2022-01-05 DIAGNOSIS — R262 Difficulty in walking, not elsewhere classified: Secondary | ICD-10-CM | POA: Diagnosis not present

## 2022-01-05 DIAGNOSIS — M2578 Osteophyte, vertebrae: Secondary | ICD-10-CM | POA: Diagnosis not present

## 2022-01-05 DIAGNOSIS — Z0389 Encounter for observation for other suspected diseases and conditions ruled out: Secondary | ICD-10-CM | POA: Diagnosis not present

## 2022-01-07 ENCOUNTER — Other Ambulatory Visit: Payer: Self-pay | Admitting: Cardiovascular Disease

## 2022-01-07 ENCOUNTER — Other Ambulatory Visit: Payer: Self-pay | Admitting: Family Medicine

## 2022-01-07 DIAGNOSIS — Z0000 Encounter for general adult medical examination without abnormal findings: Secondary | ICD-10-CM

## 2022-01-09 DIAGNOSIS — M9904 Segmental and somatic dysfunction of sacral region: Secondary | ICD-10-CM | POA: Diagnosis not present

## 2022-01-09 DIAGNOSIS — M9903 Segmental and somatic dysfunction of lumbar region: Secondary | ICD-10-CM | POA: Diagnosis not present

## 2022-01-09 DIAGNOSIS — U071 COVID-19: Secondary | ICD-10-CM | POA: Diagnosis not present

## 2022-01-09 DIAGNOSIS — M5441 Lumbago with sciatica, right side: Secondary | ICD-10-CM | POA: Diagnosis not present

## 2022-01-09 DIAGNOSIS — M461 Sacroiliitis, not elsewhere classified: Secondary | ICD-10-CM | POA: Diagnosis not present

## 2022-01-11 ENCOUNTER — Other Ambulatory Visit: Payer: Self-pay | Admitting: Cardiovascular Disease

## 2022-01-11 DIAGNOSIS — M461 Sacroiliitis, not elsewhere classified: Secondary | ICD-10-CM | POA: Diagnosis not present

## 2022-01-11 DIAGNOSIS — M5441 Lumbago with sciatica, right side: Secondary | ICD-10-CM | POA: Diagnosis not present

## 2022-01-11 DIAGNOSIS — M9903 Segmental and somatic dysfunction of lumbar region: Secondary | ICD-10-CM | POA: Diagnosis not present

## 2022-01-11 DIAGNOSIS — M9904 Segmental and somatic dysfunction of sacral region: Secondary | ICD-10-CM | POA: Diagnosis not present

## 2022-01-15 DIAGNOSIS — M9903 Segmental and somatic dysfunction of lumbar region: Secondary | ICD-10-CM | POA: Diagnosis not present

## 2022-01-15 DIAGNOSIS — M461 Sacroiliitis, not elsewhere classified: Secondary | ICD-10-CM | POA: Diagnosis not present

## 2022-01-15 DIAGNOSIS — M9904 Segmental and somatic dysfunction of sacral region: Secondary | ICD-10-CM | POA: Diagnosis not present

## 2022-01-15 DIAGNOSIS — M5441 Lumbago with sciatica, right side: Secondary | ICD-10-CM | POA: Diagnosis not present

## 2022-01-22 ENCOUNTER — Encounter (INDEPENDENT_AMBULATORY_CARE_PROVIDER_SITE_OTHER): Payer: Self-pay | Admitting: *Deleted

## 2022-01-22 DIAGNOSIS — M9903 Segmental and somatic dysfunction of lumbar region: Secondary | ICD-10-CM | POA: Diagnosis not present

## 2022-01-22 DIAGNOSIS — M461 Sacroiliitis, not elsewhere classified: Secondary | ICD-10-CM | POA: Diagnosis not present

## 2022-01-22 DIAGNOSIS — M5441 Lumbago with sciatica, right side: Secondary | ICD-10-CM | POA: Diagnosis not present

## 2022-01-22 DIAGNOSIS — M9904 Segmental and somatic dysfunction of sacral region: Secondary | ICD-10-CM | POA: Diagnosis not present

## 2022-01-25 DIAGNOSIS — M9903 Segmental and somatic dysfunction of lumbar region: Secondary | ICD-10-CM | POA: Diagnosis not present

## 2022-01-25 DIAGNOSIS — M9904 Segmental and somatic dysfunction of sacral region: Secondary | ICD-10-CM | POA: Diagnosis not present

## 2022-01-25 DIAGNOSIS — M5441 Lumbago with sciatica, right side: Secondary | ICD-10-CM | POA: Diagnosis not present

## 2022-01-25 DIAGNOSIS — M461 Sacroiliitis, not elsewhere classified: Secondary | ICD-10-CM | POA: Diagnosis not present

## 2022-01-29 ENCOUNTER — Telehealth: Payer: Self-pay | Admitting: *Deleted

## 2022-01-29 NOTE — Chronic Care Management (AMB) (Signed)
?  Care Management  ? ?Outreach Note ? ?01/29/2022 ?Name: Gregory Suarez MRN: [id number] DOB: Mar 18, 1946 ? ?Referred by: [name], MD ?Reason for referral : Care Coordination (Outreach to schedule initial with [facility] ) ? ? ?An unsuccessful telephone outreach was attempted today. The patient was referred to the case management team for assistance with care management and care coordination.  ? ?Follow Up Plan:  ?A HIPAA compliant phone message was left for the patient providing contact information and requesting a return call.  ?The care management team will reach out to the patient again over the next 7 days.  ?If patient returns call to provider office, please advise to call [place] * at [phone].* ? ?Laverda Sorenson  ?Care Guide, Embedded Care Coordination ?[place]  Care Management  ?Direct Dial: [phone] ? ?

## 2022-02-05 DIAGNOSIS — M461 Sacroiliitis, not elsewhere classified: Secondary | ICD-10-CM | POA: Diagnosis not present

## 2022-02-05 DIAGNOSIS — M9904 Segmental and somatic dysfunction of sacral region: Secondary | ICD-10-CM | POA: Diagnosis not present

## 2022-02-05 DIAGNOSIS — M5441 Lumbago with sciatica, right side: Secondary | ICD-10-CM | POA: Diagnosis not present

## 2022-02-05 DIAGNOSIS — M9903 Segmental and somatic dysfunction of lumbar region: Secondary | ICD-10-CM | POA: Diagnosis not present

## 2022-02-05 NOTE — Chronic Care Management (AMB) (Signed)
?  Care Management  ? ?Outreach Note ? ?02/05/2022 ?Name: Gregory Suarez MRN: [id number] DOB: Apr 22, 1946 ? ?Referred by: [name], MD ?Reason for referral : Care Coordination (Outreach to schedule initial with [facility] ) ? ? ?A second unsuccessful telephone outreach was attempted today. The patient was referred to the case management team for assistance with care management and care coordination.  ? ?Follow Up Plan:  ?A HIPAA compliant phone message was left for the patient providing contact information and requesting a return call.  ?The care management team will reach out to the patient again over the next 7 days.  ?If patient returns call to provider office, please advise to call [place]* at [phone].* ? ?Laverda Sorenson  ?Care Guide, Embedded Care Coordination ?[place]  Care Management  ?Direct Dial: [phone] ? ?

## 2022-02-07 NOTE — Chronic Care Management (AMB) (Signed)
?  Care Management  ? ?Note ? ?02/07/2022 ?Name: Gregory Suarez MRN: [id number] DOB: 06-12-1946 ? ?Gregory Suarez is a 76 y.o. year old male who is a primary care patient of [name], [name], MD. I reached out to [name] by phone today offer care coordination services.  ? ?Gregory Suarez was given information about care management services today including:  ?Care management services include personalized support from designated clinical staff supervised by his physician, including individualized plan of care and coordination with other care providers ?24/7 contact phone numbers for assistance for urgent and routine care needs. ?The patient may stop care management services at any time by phone call to the office staff. ? ?Patient did not agree to enrollment in care management services and does not wish to consider at this time. ? ?Follow up plan: ?Patient spouse declines engagement by the care management team.  The care management team is available to follow up with the patient after provider conversation with the patient regarding recommendation for care management engagement and subsequent re-referral to the care management team.  ? ?Gregory Suarez  ?Care Guide, Embedded Care Coordination ?[place]  Care Management  ?Direct Dial: [phone] ? ?

## 2022-02-13 ENCOUNTER — Encounter (INDEPENDENT_AMBULATORY_CARE_PROVIDER_SITE_OTHER): Payer: Self-pay | Admitting: *Deleted

## 2022-03-04 ENCOUNTER — Other Ambulatory Visit: Payer: Self-pay | Admitting: Family Medicine

## 2022-03-05 NOTE — Telephone Encounter (Signed)
Requested Prescriptions  Pending Prescriptions Disp Refills  . finasteride (PROSCAR) 5 MG tablet [Pharmacy Med Name: FINASTERIDE 5 MG TABLET] 90 tablet 3    Sig: TAKE 1 TABLET (5 MG TOTAL) BY MOUTH DAILY.     Urology: 5-alpha Reductase Inhibitors Passed - 03/04/2022  9:33 AM      Passed - PSA in normal range and within 360 days    PSA  Date Value Ref Range Status  12/15/2021 0.40 < OR = 4.00 ng/mL Final    Comment:    The total PSA value from this assay system is  standardized against the WHO standard. The test  result will be approximately 20% lower when compared  to the equimolar-standardized total PSA (Beckman  Coulter). Comparison of serial PSA results should be  interpreted with this fact in mind. . This test was performed using the Siemens  chemiluminescent method. Values obtained from  different assay methods cannot be used interchangeably. PSA levels, regardless of value, should not be interpreted as absolute evidence of the presence or absence of disease.          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

## 2022-03-08 ENCOUNTER — Other Ambulatory Visit: Payer: Self-pay | Admitting: Family Medicine

## 2022-03-19 ENCOUNTER — Encounter (INDEPENDENT_AMBULATORY_CARE_PROVIDER_SITE_OTHER): Payer: Self-pay | Admitting: Gastroenterology

## 2022-03-19 ENCOUNTER — Ambulatory Visit (INDEPENDENT_AMBULATORY_CARE_PROVIDER_SITE_OTHER): Payer: No Typology Code available for payment source | Admitting: Gastroenterology

## 2022-03-19 ENCOUNTER — Encounter (INDEPENDENT_AMBULATORY_CARE_PROVIDER_SITE_OTHER): Payer: Self-pay

## 2022-03-19 ENCOUNTER — Other Ambulatory Visit (INDEPENDENT_AMBULATORY_CARE_PROVIDER_SITE_OTHER): Payer: Self-pay

## 2022-03-19 VITALS — BP 153/77 | HR 76 | Temp 97.9°F | Ht 75.0 in | Wt 264.4 lb

## 2022-03-19 DIAGNOSIS — R790 Abnormal level of blood mineral: Secondary | ICD-10-CM | POA: Insufficient documentation

## 2022-03-19 DIAGNOSIS — R197 Diarrhea, unspecified: Secondary | ICD-10-CM | POA: Diagnosis not present

## 2022-03-19 DIAGNOSIS — R195 Other fecal abnormalities: Secondary | ICD-10-CM | POA: Insufficient documentation

## 2022-03-19 NOTE — Patient Instructions (Addendum)
Cologuard is intended for the qualitative detection of colorectal neoplasia associated DNA markers and for the presence of occult hemoglobin in human stool. A positive result may indicate the presence of colorectal cancer (CRC) or pre cancerous polyps, and should be followed by colonoscopy. A negative Cologuard test result does not guarantee absence of cancer or pre cancerous polyp. False positives and false negatives do occur. Cologuard is not recommended in patients who have history of colon polyps.   We will get you scheduled for colonoscopy given your recent positive cologuard,it is possible that they may also like to proceed with upper endoscopy at time of colonoscopy as well given your slightly low ferritin (one of your iron studies) this will be determined the day of the procedure.   In regards to looser stools, we will check thyroid and celiac panel to rule these out as underlying cause of diarrhea.  Follow up 3 months

## 2022-03-19 NOTE — Progress Notes (Signed)
Referring Provider: [name], MD Primary Care Physician:  [name], MD Primary GI Physician: new  Chief Complaint  Patient presents with   Positive cologuard    Patient referred for positive cologuard test.    HPI:   Gregory Suarez is a 76 y.o. male with past medical history of BCC, CAD, ED, GERD, HLD, HTN, MVR, OSA, RLS, SCC, tubular adenoma of colon   Patient presenting today as a new patient for positive cologuard.  History: Cologuard 12/27/21: Positive  Last labs 12/15/21 with hgb of 14.7 (per chart review, appears WNL over the past few years), iron 54, TIBC 258, sat 21 and Ferritin low at 21, patient started on PO iron by PCP.  TSH was normal at 3.080 in feb 2022 Notably he had positive cologuard in 2018 as well, followed by colonoscopy with findings as outlined below  Present: patient reports recent positive cologuard, he does note that he is maintained on iron pills. Has darker stools since starting iron, denies rectal bleeding.  He states that he was started on iron pills about 6 weeks ago. seen for RLS and was told to have his iron checked as this could be contributing to his RLS.  Notably, labs as listed above. He has 1-3 BMs per day sometimes, other times he can go up to 1 week without a BM, will use miralax PRN. He goes between diarrhea and constipation, though over the last few months stools have been more loose, has not pinpointed any specific triggers to his symptoms. He will take imodium PRN for diarrhea with good results. He reports some fecal urgency but denies fecal incontinence. Denies any abdominal pain, changes in appetite or weight loss. Denies nausea or vomiting.   He is maintained on Protonix '40mg'$  daily with good results. Denies dysphagia or odynophagia.   NSAID use: uses tylenol  Social hx:no etoh or tobacco Fam hx:no CRC or liver disease   Last Colonoscopy:07/17/17 five 7-67m, polyps in sigmoid (hyperplastic), proximal and mid ascending  colon(tubular adenomas), one small sessile in mid transverse, prominent internal hemorrhoids Last Endoscopy: 20-30 years ago   Recommendations:    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 90 tablet 3   atorvastatin (LIPITOR) 80 MG tablet TAKE 1 TABLET BY MOUTH EVERY DAY 90 tablet 3   benzonatate (TESSALON) 100 MG capsule Take 100 mg by mouth 3 (three) times daily as needed for cough.     chlorpheniramine (CHLOR-TRIMETON) 4 MG tablet Take 8 mg by mouth at bedtime.      finasteride (PROSCAR) 5 MG tablet TAKE 1 TABLET (5 MG TOTAL) BY MOUTH DAILY. 90 tablet 3   hydrochlorothiazide (MICROZIDE) 12.5 MG capsule Take 12.5 mg by mouth daily as needed (fluid/swelling).     hydrocortisone 2.5 % cream Apply topically as directed. Apply to scaly rash on face hs on Tuesday,  Thursday, and Saturday 30 g 6   ketoconazole (NIZORAL) 2 % cream Apply 1 application. topically daily. Qhs to feet for 1 month, Apply to scaly areas on face 3 times weekly, Monday, Wednesday and Friday 60 g 11   lidocaine (LIDODERM) 5 % Place 2 patches onto the skin daily. Remove & Discard patch within 12 hours or as directed by MD 30 patch 0   Melatonin 5 MG TABS Take 10 mg by mouth at bedtime.      metoprolol tartrate (LOPRESSOR) 25 MG tablet TAKE 1 TABLET BY MOUTH TWICE A DAY 180 tablet 1   mometasone (ELOCON) 0.1 % cream APPLY TO AFFECTED AREA TWICE A DAY 45 g 0   Multiple Vitamin (MULTIVITAMIN WITH MINERALS) TABS tablet Take 1 tablet by mouth in the morning and at bedtime.      OVER THE COUNTER MEDICATION Iron one daily     OVER THE COUNTER MEDICATION Vit D3 one daily     OVER THE COUNTER MEDICATION Vitamin K2 one daily     pantoprazole (PROTONIX) 40 MG tablet TAKE 1 TABLET BY MOUTH TWICE A DAY 180 tablet 3   pramipexole (MIRAPEX) 0.25 MG tablet TAKE 2 TABLETS (0.5 MG TOTAL) BY MOUTH AT BEDTIME. 180 tablet 1   tadalafil (CIALIS) 5 MG tablet TAKE ONE TABLET BY MOUTH ONE TIME DAILY 90 tablet 2   terbinafine (LAMISIL) 250 MG tablet Take 1 tablet (250 mg total) by mouth daily. 30 tablet 0   terbinafine (LAMISIL) 250 MG tablet Take 1 tablet (250 mg total) by mouth daily. 30 tablet 1   tiZANidine (ZANAFLEX) 4 MG tablet Take 1 tablet (4 mg total) by mouth every 8 (eight) hours as needed for muscle spasms. 30 tablet 1   valACYclovir (VALTREX) 1000 MG tablet Take 1,000 mg by mouth daily as needed (outbreak).     No current facility-administered medications for this visit.    Allergies as of 03/19/2022 - Review Complete 03/19/2022  Allergen Reaction Noted   Rosuvastatin Other (See Comments) 02/02/2015    Family History  Problem Relation Age of Onset   Diabetes Other    Breast cancer Other    Coronary artery disease Other     Social History   Socioeconomic History   Marital status:  Married    Spouse name: Not on file   Number of children: Not on file   Years of education: Not on file   Highest education level: Not on file  Occupational History   Occupation: RETIRED  Tobacco Use   Smoking status: Former    Packs/day: 0.50    Years: 15.00    Total pack years: 7.50    Types: Cigarettes    Quit date: 10/01/1988    Years since quitting: 33.4    Passive exposure: Past  Smokeless tobacco: Never  Substance and Sexual Activity   Alcohol use: No    Alcohol/week: 0.0 standard drinks of alcohol   Drug use: No   Sexual activity: Yes    Comment: married, 2 grown sons, retired maintenance man  Other Topics Concern   Not on file  Social History Narrative   Not on file   Social Determinants of Health   Financial Resource Strain: Low Risk  (05/13/2020)   Overall Financial Resource Strain (CARDIA)    Difficulty of Paying Living Expenses: Not very hard  Food Insecurity: Not on file  Transportation Needs: Not on file  Physical Activity: Not on file  Stress: Not on file  Social Connections: Not on file   Review of systems General: negative for malaise, night sweats, fever, chills, weight loss Neck: Negative for lumps, goiter, pain and significant neck swelling Resp: Negative for cough, wheezing, dyspnea at rest CV: Negative for chest pain, leg swelling, palpitations, orthopnea GI: denies melena, hematochezia, nausea, vomiting, constipation, dysphagia, odyonophagia, early satiety or unintentional weight loss. +diarrhea  MSK: Negative for joint pain or swelling, back pain, and muscle pain. Derm: Negative for itching or rash Psych: Denies depression, anxiety, memory loss, confusion. No homicidal or suicidal ideation.  Heme: Negative for prolonged bleeding, bruising easily, and swollen nodes. Endocrine: Negative for cold or heat intolerance, polyuria, polydipsia and goiter. Neuro: negative for tremor, gait imbalance, syncope and seizures. The remainder of the review of  systems is noncontributory.  Physical Exam: BP (!) 153/77 (BP Location: Left Arm, Patient Position: Sitting, Cuff Size: Large)   Pulse 76   Temp 97.9 F (36.6 C) (Oral)   Ht '6\' 3"'$  (1.905 m)   Wt 264 lb 6.4 oz (119.9 kg)   BMI 33.05 kg/m  General:   Alert and oriented. No distress noted. Pleasant and cooperative.  Head:  Normocephalic and atraumatic. Eyes:  Conjuctiva clear without scleral icterus. Mouth:  Oral mucosa pink and moist. Good dentition. No lesions. Heart: Normal rate and rhythm, s1 and s2 heart sounds present.  Lungs: Clear lung sounds in all lobes. Respirations equal and unlabored. Abdomen:  +BS, soft, non-tender and non-distended. No rebound or guarding. No HSM or masses noted. Derm: No palmar erythema or jaundice Msk:  Symmetrical without gross deformities. Normal posture. Extremities:  Without edema. Neurologic:  Alert and  oriented x4 Psych:  Alert and cooperative. Normal mood and affect.  Invalid input(s): "6 MONTHS"   ASSESSMENT: Gregory Suarez is a 76 y.o. male presenting today as a new patient for positive cologuard.  Patient with history of colon polyps, had positive cologuard in 2018 with colonoscopy following with presence of polyps, had repeat cologuard in March this year that was positive. I did discuss with patient that cologuard testing is contraindicated in patients with history of colon polyps and recommended against further cologuard testing. Patient denies rectal bleeding, melena. Notably, patient with low normal iron studies and low ferritin at 21, though normal hemoglobin in march. Due to low ferritin and low normal iron studies, otherwise, concern for possible evolving IDA will schedule colonoscopy +/- EGD for further evaluation. Indications, risks and benefits of procedure discussed in detail with patient. Patient verbalized understanding and is in agreement to proceed with colonoscopy +/- EGD at this time.  In regards to more looser stools recently,  will check TSH and celiac to rule out underlying thyroid dysfunction or gluten allergy as contributors to his symptoms. Can continue imodium PRN. Symptoms could be related to IBS-M, given his  previous mix of both constipation and diarrhea and he may benefit from food journal and low FODMAP if above studies are unremarkable.    PLAN:  Schedule colonoscopy +/- EGD (low ferritin/possibly evolving IDA) 2.  Celiac panel and TSH 3. Continue oral iron 4. Recommend against further cologuard testing given colon polyp history   All questions were answered, patient verbalized understanding and is in agreement with plan as outlined above.   Follow Up: 3 months  Graclyn Lawther L. Alver Sorrow, MSN, APRN, AGNP-C Adult-Gerontology Nurse Practitioner [facility]

## 2022-03-20 ENCOUNTER — Ambulatory Visit: Payer: BLUE CROSS/BLUE SHIELD | Admitting: Podiatry

## 2022-03-20 ENCOUNTER — Encounter (INDEPENDENT_AMBULATORY_CARE_PROVIDER_SITE_OTHER): Payer: Self-pay

## 2022-03-21 DIAGNOSIS — M25511 Pain in right shoulder: Secondary | ICD-10-CM | POA: Diagnosis not present

## 2022-03-21 DIAGNOSIS — M160 Bilateral primary osteoarthritis of hip: Secondary | ICD-10-CM | POA: Diagnosis not present

## 2022-03-21 DIAGNOSIS — M25561 Pain in right knee: Secondary | ICD-10-CM | POA: Diagnosis not present

## 2022-03-21 DIAGNOSIS — M5136 Other intervertebral disc degeneration, lumbar region: Secondary | ICD-10-CM | POA: Diagnosis not present

## 2022-03-21 DIAGNOSIS — M545 Low back pain, unspecified: Secondary | ICD-10-CM | POA: Diagnosis not present

## 2022-03-22 ENCOUNTER — Encounter: Payer: Self-pay | Admitting: Podiatry

## 2022-03-22 ENCOUNTER — Ambulatory Visit (INDEPENDENT_AMBULATORY_CARE_PROVIDER_SITE_OTHER): Payer: Medicare Other | Admitting: Podiatry

## 2022-03-22 DIAGNOSIS — M79675 Pain in left toe(s): Secondary | ICD-10-CM | POA: Diagnosis not present

## 2022-03-22 DIAGNOSIS — M79674 Pain in right toe(s): Secondary | ICD-10-CM

## 2022-03-22 DIAGNOSIS — L608 Other nail disorders: Secondary | ICD-10-CM | POA: Diagnosis not present

## 2022-03-22 DIAGNOSIS — B351 Tinea unguium: Secondary | ICD-10-CM | POA: Diagnosis not present

## 2022-03-22 LAB — CELIAC DISEASE PANEL
(tTG) Ab, IgA: <1 U/mL
(tTG) Ab, IgG: 3 U/mL
Gliadin IgA: 1.1 U/mL
Gliadin IgG: <1 U/mL
Immunoglobulin A: 297 mg/dL (ref 70–320)

## 2022-03-22 LAB — TSH: TSH: 1.64 mIU/L (ref 0.40–4.50)

## 2022-03-22 NOTE — Progress Notes (Signed)
This patient presents to the office with chief complaint of long thick painful nails.  Patient says the nails are painful walking and wearing shoes.  This patient is unable to self treat.  This patient is unable to trim his  nails since she is unable to reach his nails.  She presents to the office for preventative foot care services.  General Appearance  Alert, conversant and in no acute stress.  Vascular  Dorsalis pedis and posterior tibial  pulses are absent   bilaterally.  Capillary return is within normal limits  bilaterally. Temperature is within normal limits  bilaterally.  Neurologic  Senn-Weinstein monofilament wire test within normal limits  bilaterally. Muscle power within normal limits bilaterally.  Nails Thick disfigured discolored nails with subungual debris hallux nails . No evidence of bacterial infection or drainage bilaterally.  Orthopedic  No limitations of motion  feet .  No crepitus or effusions noted.  No bony pathology or digital deformities noted. Mild HAV  B/L.  Hammer toes with deviated toe 3,4  B/L.  Prominent metsatarsal heads.  Cavus foot type  B/L.  Skin  normotropic skin with no porokeratosis noted bilaterally.  No signs of infections or ulcers noted.     Onychomycosis  Nails  B/L.  Pain in right toes  Pain in left toes  Debridement of nails both feet followed trimming the nails with dremel tool.    RTC 3 months.   Dewel Lotter DPM   

## 2022-03-26 DIAGNOSIS — M9903 Segmental and somatic dysfunction of lumbar region: Secondary | ICD-10-CM | POA: Diagnosis not present

## 2022-03-26 DIAGNOSIS — M461 Sacroiliitis, not elsewhere classified: Secondary | ICD-10-CM | POA: Diagnosis not present

## 2022-03-26 DIAGNOSIS — M9904 Segmental and somatic dysfunction of sacral region: Secondary | ICD-10-CM | POA: Diagnosis not present

## 2022-03-26 DIAGNOSIS — M5441 Lumbago with sciatica, right side: Secondary | ICD-10-CM | POA: Diagnosis not present

## 2022-03-27 DIAGNOSIS — E531 Pyridoxine deficiency: Secondary | ICD-10-CM | POA: Diagnosis not present

## 2022-03-27 DIAGNOSIS — E559 Vitamin D deficiency, unspecified: Secondary | ICD-10-CM | POA: Diagnosis not present

## 2022-03-27 DIAGNOSIS — Z79899 Other long term (current) drug therapy: Secondary | ICD-10-CM | POA: Diagnosis not present

## 2022-03-27 DIAGNOSIS — E519 Thiamine deficiency, unspecified: Secondary | ICD-10-CM | POA: Diagnosis not present

## 2022-03-27 DIAGNOSIS — R251 Tremor, unspecified: Secondary | ICD-10-CM | POA: Diagnosis not present

## 2022-03-27 DIAGNOSIS — E538 Deficiency of other specified B group vitamins: Secondary | ICD-10-CM | POA: Diagnosis not present

## 2022-03-28 ENCOUNTER — Telehealth: Payer: Self-pay

## 2022-03-28 NOTE — Telephone Encounter (Signed)
FYI (conversation w/wife--very condescending demeanor)  Return call to pt from 03/27/22 VM, spoke with pt's wife Malachy Mood. Wife started to tell me about the message that was left yesterday. Wife, then mention that the office was dark and no one came to the door when pt knock on it. Told wife we are closed on Wed. And always have for as long as I've been working here. I told wife there should be a sign at the door.  Per pt stated there was no sign, just regular hours.  Wife, then, asked if she could have notes dated back to 5/21 printed so that pt could come and pick it up . I told pt's wife that all the notes for the pt could be retrieved from his 51, per wife, they don't want to do that. Per wife she wanted me to do that for them. I also told the wife that she would have to call our medical records dept and they can do that for her as well, wife refused, she just kept on asking,"why I would not do it for her?" I, then reword my answer and told wife I will call the medical records dept and give them the fax number so that they can fax to [place] electronically. Wife, continue to say, "so you're not going to do this for your pt?" I told wife that I would not print out 57yr of notes for her, plus I will need a medical release form filled out before we can fax/send anything to the other provider([facility], fax [phone]. Wife stated that," I am giving you verbal permission to do it for the pt." I, told wife, no, and that we need the pt to come in and sign the release form. Pt's wife said, will pt already made a trip to come out today (Wed) and no one was there to open the door. I told wife, first, no one knew pt was going to come today, plus we are close on Wed. Wife condescendingly said,"fine"we will make another trip to come tom to fill out the release form and hung up.

## 2022-04-04 DIAGNOSIS — M25551 Pain in right hip: Secondary | ICD-10-CM | POA: Diagnosis not present

## 2022-04-05 ENCOUNTER — Telehealth: Payer: Self-pay | Admitting: Pulmonary Disease

## 2022-04-10 DIAGNOSIS — M1611 Unilateral primary osteoarthritis, right hip: Secondary | ICD-10-CM | POA: Diagnosis not present

## 2022-04-24 ENCOUNTER — Other Ambulatory Visit: Payer: Self-pay | Admitting: Family Medicine

## 2022-04-24 DIAGNOSIS — M1611 Unilateral primary osteoarthritis, right hip: Secondary | ICD-10-CM | POA: Diagnosis not present

## 2022-04-24 DIAGNOSIS — M25561 Pain in right knee: Secondary | ICD-10-CM | POA: Diagnosis not present

## 2022-05-03 ENCOUNTER — Other Ambulatory Visit: Payer: Self-pay

## 2022-05-03 ENCOUNTER — Encounter (HOSPITAL_COMMUNITY): Payer: Self-pay

## 2022-05-03 ENCOUNTER — Encounter (HOSPITAL_COMMUNITY)
Admission: RE | Admit: 2022-05-03 | Discharge: 2022-05-03 | Disposition: A | Discharge status: Home / Self Care | Payer: No Typology Code available for payment source | Source: Ambulatory Visit | Attending: Gastroenterology | Admitting: Gastroenterology

## 2022-05-08 ENCOUNTER — Ambulatory Visit (HOSPITAL_COMMUNITY): Payer: No Typology Code available for payment source | Admitting: Anesthesiology

## 2022-05-08 ENCOUNTER — Encounter (INDEPENDENT_AMBULATORY_CARE_PROVIDER_SITE_OTHER): Payer: Self-pay | Admitting: *Deleted

## 2022-05-08 ENCOUNTER — Encounter (HOSPITAL_COMMUNITY)
Admission: RE | Disposition: A | Discharge status: Home / Self Care | Payer: Self-pay | Source: Home / Self Care | Attending: Gastroenterology

## 2022-05-08 ENCOUNTER — Ambulatory Visit (HOSPITAL_COMMUNITY)
Admission: RE | Admit: 2022-05-08 | Discharge: 2022-05-08 | Disposition: A | Discharge status: Home / Self Care | Payer: No Typology Code available for payment source | Attending: Gastroenterology | Admitting: Gastroenterology

## 2022-05-08 ENCOUNTER — Ambulatory Visit (HOSPITAL_BASED_OUTPATIENT_CLINIC_OR_DEPARTMENT_OTHER): Payer: No Typology Code available for payment source | Admitting: Anesthesiology

## 2022-05-08 ENCOUNTER — Encounter (HOSPITAL_COMMUNITY): Payer: Self-pay | Admitting: Gastroenterology

## 2022-05-08 DIAGNOSIS — K219 Gastro-esophageal reflux disease without esophagitis: Secondary | ICD-10-CM | POA: Diagnosis not present

## 2022-05-08 DIAGNOSIS — K635 Polyp of colon: Secondary | ICD-10-CM | POA: Diagnosis not present

## 2022-05-08 DIAGNOSIS — D124 Benign neoplasm of descending colon: Secondary | ICD-10-CM | POA: Insufficient documentation

## 2022-05-08 DIAGNOSIS — J449 Chronic obstructive pulmonary disease, unspecified: Secondary | ICD-10-CM | POA: Insufficient documentation

## 2022-05-08 DIAGNOSIS — K449 Diaphragmatic hernia without obstruction or gangrene: Secondary | ICD-10-CM | POA: Diagnosis not present

## 2022-05-08 DIAGNOSIS — Z85828 Personal history of other malignant neoplasm of skin: Secondary | ICD-10-CM | POA: Insufficient documentation

## 2022-05-08 DIAGNOSIS — Z1211 Encounter for screening for malignant neoplasm of colon: Secondary | ICD-10-CM | POA: Insufficient documentation

## 2022-05-08 DIAGNOSIS — K31A19 Gastric intestinal metaplasia without dysplasia, unspecified site: Secondary | ICD-10-CM | POA: Diagnosis not present

## 2022-05-08 DIAGNOSIS — E785 Hyperlipidemia, unspecified: Secondary | ICD-10-CM | POA: Insufficient documentation

## 2022-05-08 DIAGNOSIS — K648 Other hemorrhoids: Secondary | ICD-10-CM | POA: Diagnosis not present

## 2022-05-08 DIAGNOSIS — D122 Benign neoplasm of ascending colon: Secondary | ICD-10-CM | POA: Diagnosis not present

## 2022-05-08 DIAGNOSIS — D123 Benign neoplasm of transverse colon: Secondary | ICD-10-CM | POA: Insufficient documentation

## 2022-05-08 DIAGNOSIS — R790 Abnormal level of blood mineral: Secondary | ICD-10-CM

## 2022-05-08 DIAGNOSIS — I10 Essential (primary) hypertension: Secondary | ICD-10-CM | POA: Insufficient documentation

## 2022-05-08 DIAGNOSIS — G4733 Obstructive sleep apnea (adult) (pediatric): Secondary | ICD-10-CM | POA: Insufficient documentation

## 2022-05-08 DIAGNOSIS — K2289 Other specified disease of esophagus: Secondary | ICD-10-CM | POA: Diagnosis not present

## 2022-05-08 DIAGNOSIS — K31819 Angiodysplasia of stomach and duodenum without bleeding: Secondary | ICD-10-CM | POA: Diagnosis not present

## 2022-05-08 DIAGNOSIS — K317 Polyp of stomach and duodenum: Secondary | ICD-10-CM | POA: Insufficient documentation

## 2022-05-08 DIAGNOSIS — D509 Iron deficiency anemia, unspecified: Secondary | ICD-10-CM | POA: Diagnosis not present

## 2022-05-08 DIAGNOSIS — R195 Other fecal abnormalities: Secondary | ICD-10-CM | POA: Insufficient documentation

## 2022-05-08 DIAGNOSIS — Z87891 Personal history of nicotine dependence: Secondary | ICD-10-CM | POA: Diagnosis not present

## 2022-05-08 DIAGNOSIS — I2510 Atherosclerotic heart disease of native coronary artery without angina pectoris: Secondary | ICD-10-CM | POA: Diagnosis not present

## 2022-05-08 DIAGNOSIS — K2270 Barrett's esophagus without dysplasia: Secondary | ICD-10-CM | POA: Diagnosis not present

## 2022-05-08 DIAGNOSIS — K208 Other esophagitis without bleeding: Secondary | ICD-10-CM | POA: Diagnosis not present

## 2022-05-08 HISTORY — PX: POLYPECTOMY: SHX5525

## 2022-05-08 HISTORY — PX: BIOPSY: SHX5522

## 2022-05-08 HISTORY — PX: ESOPHAGOGASTRODUODENOSCOPY (EGD) WITH PROPOFOL: SHX5813

## 2022-05-08 HISTORY — PX: COLONOSCOPY WITH PROPOFOL: SHX5780

## 2022-05-08 LAB — HM COLONOSCOPY

## 2022-05-08 SURGERY — COLONOSCOPY WITH PROPOFOL
Anesthesia: General

## 2022-05-08 MED ORDER — PROPOFOL 500 MG/50ML IV EMUL
INTRAVENOUS | Status: AC
  Filled 2022-05-08: qty 50

## 2022-05-08 MED ORDER — LIDOCAINE HCL (PF) 2 % IJ SOLN
INTRAMUSCULAR | Status: AC
  Filled 2022-05-08: qty 5

## 2022-05-08 MED ORDER — LIDOCAINE HCL (CARDIAC) PF 100 MG/5ML IV SOSY
PREFILLED_SYRINGE | INTRAVENOUS | Status: DC | PRN
  Administered 2022-05-08: 100 mg via INTRAVENOUS

## 2022-05-08 MED ORDER — PROPOFOL 10 MG/ML IV BOLUS
INTRAVENOUS | Status: DC | PRN
  Administered 2022-05-08: 40 mg via INTRAVENOUS

## 2022-05-08 MED ORDER — PHENYLEPHRINE 80 MCG/ML (10ML) SYRINGE FOR IV PUSH (FOR BLOOD PRESSURE SUPPORT)
PREFILLED_SYRINGE | INTRAVENOUS | Status: DC | PRN
  Administered 2022-05-08 (×7): 80 ug via INTRAVENOUS

## 2022-05-08 MED ORDER — PROPOFOL 500 MG/50ML IV EMUL
INTRAVENOUS | Status: DC | PRN
  Administered 2022-05-08: 150 ug/kg/min via INTRAVENOUS

## 2022-05-08 MED ORDER — LACTATED RINGERS IV SOLN
INTRAVENOUS | Status: DC
Start: 2022-05-08 — End: 2022-05-08
  Administered 2022-05-08: 1000 mL via INTRAVENOUS

## 2022-05-08 MED ORDER — GLYCOPYRROLATE PF 0.2 MG/ML IJ SOSY
PREFILLED_SYRINGE | INTRAMUSCULAR | Status: AC
  Filled 2022-05-08: qty 1

## 2022-05-08 MED ORDER — STERILE WATER FOR IRRIGATION IR SOLN
Status: DC | PRN
  Administered 2022-05-08: .6 mL

## 2022-05-08 MED ORDER — PROPOFOL 500 MG/50ML IV EMUL
INTRAVENOUS | Status: AC
  Filled 2022-05-08: qty 100

## 2022-05-08 MED ORDER — PHENYLEPHRINE 80 MCG/ML (10ML) SYRINGE FOR IV PUSH (FOR BLOOD PRESSURE SUPPORT)
PREFILLED_SYRINGE | INTRAVENOUS | Status: AC
  Filled 2022-05-08: qty 10

## 2022-05-08 MED ORDER — DEXMEDETOMIDINE HCL IN NACL 80 MCG/20ML IV SOLN
INTRAVENOUS | Status: AC
  Filled 2022-05-08: qty 20

## 2022-05-08 MED ORDER — DEXMEDETOMIDINE (PRECEDEX) IN NS 20 MCG/5ML (4 MCG/ML) IV SYRINGE
PREFILLED_SYRINGE | INTRAVENOUS | Status: DC | PRN
  Administered 2022-05-08: 4 ug via INTRAVENOUS
  Administered 2022-05-08: 8 ug via INTRAVENOUS
  Administered 2022-05-08: 4 ug via INTRAVENOUS

## 2022-05-08 NOTE — Op Note (Signed)
[facility] Patient Name: Gregory Suarez Procedure Date: 05/08/2022 7:11 AM MRN: [id number] Date of Birth: 12/08/1945 Attending MD: Maylon Peppers ,  CSN: [id number] Age: 76 Admit Type: Outpatient Procedure:                Colonoscopy Indications:              Positive Cologuard test Providers:                Maylon Peppers, Janeece Riggers, RN, Raphael Gibney,                            Technician Referring MD:              Medicines:                Monitored Anesthesia Care Complications:            No immediate complications. Estimated Blood Loss:     Estimated blood loss: none. Procedure:                Pre-Anesthesia Assessment:                           - Prior to the procedure, a History and Physical                            was performed, and patient medications, allergies                            and sensitivities were reviewed. The patient's                            tolerance of previous anesthesia was reviewed.                           - The risks and benefits of the procedure and the                            sedation options and risks were discussed with the                            patient. All questions were answered and informed                            consent was obtained.                           - ASA Grade Assessment: III - A patient with severe                            systemic disease.                           After obtaining informed consent, the colonoscope                            was passed under direct vision. Throughout the  procedure, the patient's blood pressure, pulse, and                            oxygen saturations were monitored continuously. The                            [id number]-HQ190L ([id number]) scope was introduced through                            the anus and advanced to the the cecum, identified                            by appendiceal orifice and ileocecal valve. The                             colonoscopy was performed without difficulty. The                            patient tolerated the procedure well. The quality                            of the bowel preparation was adequate. Scope In: 7:49:32 AM Scope Out: 8:15:06 AM Scope Withdrawal Time: 0 hours 17 minutes 16 seconds  Total Procedure Duration: 0 hours 25 minutes 34 seconds  Findings:      The perianal and digital rectal examinations were normal.      Five sessile polyps were found in the transverse colon and ascending       colon. The polyps were 4 to 8 mm in size. These polyps were removed with       a cold snare. Resection and retrieval were complete.      A 5 mm polyp was found in the descending colon. The polyp was sessile.       The polyp was removed with a cold snare. Resection and retrieval were       complete.      Non-bleeding internal hemorrhoids were found during retroflexion. The       hemorrhoids were medium-sized. Impression:               - Five 4 to 8 mm polyps in the transverse colon and                            in the ascending colon, removed with a cold snare.                            Resected and retrieved.                           - One 5 mm polyp in the descending colon, removed                            with a cold snare. Resected and retrieved.                           - Non-bleeding internal hemorrhoids. Moderate Sedation:  Per Anesthesia Care Recommendation:           - Discharge patient to home (ambulatory).                           - Resume previous diet.                           - Await pathology results.                           - Repeat colonoscopy in 3 years for surveillance. Procedure Code(s):        --- Professional ---                           [phone], Colonoscopy, flexible; with removal of                            tumor(s), polyp(s), or other lesion(s) by snare                            technique Diagnosis Code(s):        --- Professional ---                            K63.5, Polyp of colon                           K64.8, Other hemorrhoids                           R19.5, Other fecal abnormalities CPT copyright 2019 [employer]. All rights reserved. The codes documented in this report are preliminary and upon coder review may  be revised to meet current compliance requirements. Maylon Peppers, MD Maylon Peppers,  05/08/2022 8:22:55 AM This report has been signed electronically. Number of Addenda: 0

## 2022-05-08 NOTE — H&P (Signed)
Gregory Suarez is an 76 y.o. male.   Chief Complaint: iron deficiency and positive Cologuard HPI: 76 y/o M withPMh basal cell carcinoma, CAD, OSA, GERD, ED, HLD, HTN, who comes for positive iron deficiency anemia and positive Cologuard.  Patient denies having any complaints.  No melena, hematochezia, abdominal pain, nausea, vomiting, fever or chills.  Has presented some lightheadedness.  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 His smoking use included cigarettes. He has a 7.50 pack-year smoking history. He has been exposed to tobacco smoke. He has never used smokeless tobacco. He reports that he does not drink alcohol and does not use drugs.  Allergies:  Allergies  Allergen Reactions   Crestor [Rosuvastatin] Rash and Other (See Comments)    "Did not feel well"     Medications Prior to  Admission  Medication Sig Dispense Refill   acetaminophen (TYLENOL) 500 MG tablet Take 500-1,000 mg by mouth every 6 (six) hours as needed (pain.).     amLODipine (NORVASC) 2.5 MG tablet TAKE 1 TABLET BY MOUTH EVERY DAY (Patient taking differently: Take 2.5 mg by mouth every evening.) 90 tablet 3   aspirin EC 81 MG tablet Take 1 tablet (81 mg total) by mouth daily. 90 tablet 3   atorvastatin (LIPITOR) 80 MG tablet TAKE 1 TABLET BY MOUTH EVERY DAY (Patient taking differently: Take 80 mg by mouth at bedtime.) 90 tablet 3   benzonatate (TESSALON) 100 MG capsule Take 100 mg by mouth 3 (three) times daily as needed for cough.     Carbidopa-Levodopa ER (SINEMET CR) 25-100 MG tablet controlled release Take 1 tablet by mouth at bedtime.     CHLORPHENIRAMINE MALEATE PO Take 8 mg by mouth at bedtime.      Cholecalciferol (VITAMIN D-3) 125 MCG (5000 UT) TABS Take 5,000 Units by mouth in the morning.     cyanocobalamin (VITAMIN B12) 1000 MCG/ML injection Inject 1,000 mcg into the muscle once a week.     ferrous sulfate 325 (65 FE) MG tablet Take 325 mg by mouth daily with breakfast.     finasteride (PROSCAR) 5 MG tablet TAKE 1 TABLET (5 MG TOTAL) BY MOUTH DAILY. (Patient taking differently:  Take 5 mg by mouth every evening.) 90 tablet 3   hydrochlorothiazide (MICROZIDE) 12.5 MG capsule Take 12.5 mg by mouth daily as needed (fluid/swelling).     hydrocortisone 2.5 % cream Apply topically as directed. Apply to scaly rash on face hs on Tuesday, Thursday, and Saturday (Patient taking differently: Apply 1 Application topically 3 (three) times a week. Apply to scaly rash on face hs on Tuesday, Thursday, and Saturday) 30 g 6   ketoconazole (NIZORAL) 2 % cream Apply 1 application. topically daily. Qhs to feet for 1 month, Apply to scaly areas on face 3 times weekly, Monday, Wednesday and Friday (Patient taking differently: Apply 1 application  topically every Monday, Wednesday, and Friday. Qhs to feet for 1 month, Apply  to scaly areas on face 3 times weekly, Monday, Wednesday and Friday) 60 g 11   meclizine (ANTIVERT) 25 MG tablet Take 25 mg by mouth 3 (three) times daily as needed for dizziness.     Melatonin 5 MG TABS Take 10 mg by mouth at bedtime.      Menatetrenone (VITAMIN K2) 100 MCG TABS Take by mouth in the morning.     metoprolol tartrate (LOPRESSOR) 25 MG tablet TAKE 1 TABLET BY MOUTH TWICE A DAY 180 tablet 3   mometasone (ELOCON) 0.1 % cream APPLY TO AFFECTED AREA TWICE A DAY (Patient taking differently: Apply 1 Application topically daily as needed (skin irritation.).) 45 g 0   Nutritional Supplements (PRO-PEPTIDE PO) Take 1 capsule by mouth in the morning.  BPC 157 (gastric pentadecapeptide)     pantoprazole (PROTONIX) 40 MG tablet TAKE 1 TABLET BY MOUTH TWICE A DAY 180 tablet 3   pramipexole (MIRAPEX) 0.25 MG tablet TAKE 2 TABLETS (0.5 MG TOTAL) BY MOUTH AT BEDTIME. 180 tablet 1   pyridOXINE (VITAMIN B6) 100 MG tablet Take 100 mg by mouth in the morning.     tadalafil (CIALIS) 5 MG tablet TAKE ONE TABLET BY MOUTH ONE TIME DAILY (Patient taking differently: Take 5 mg by mouth in the morning.) 90 tablet 2   terbinafine (LAMISIL) 250 MG tablet Take 1 tablet (250 mg total) by mouth daily. 30 tablet 1   tiZANidine (ZANAFLEX) 4 MG tablet Take 1 tablet (4 mg total) by mouth every 8 (eight) hours as needed for muscle spasms. 30 tablet 1   valACYclovir (VALTREX) 1000 MG tablet Take 1,000 mg by mouth daily as needed (outbreak).      No results found for this or any previous visit (from the past 48 hour(s)). No results found.  Review of Systems  All other systems reviewed and are negative.   Blood pressure (!) 165/87, temperature 98.3 F (36.8 C), temperature source Oral, resp. rate 19, SpO2 97 %. Physical Exam  GENERAL: The patient is AO x3, in no acute distress. HEENT: Head is normocephalic and atraumatic. EOMI are intact. Mouth is well hydrated and without lesions. NECK: Supple. No masses LUNGS:  Clear to auscultation. No presence of rhonchi/wheezing/rales. Adequate chest expansion HEART: RRR, normal s1 and s2. ABDOMEN: Soft, nontender, no guarding, no peritoneal signs, and nondistended. BS +. No masses. EXTREMITIES: Without any cyanosis, clubbing, rash, lesions or edema. NEUROLOGIC: AOx3, no focal motor deficit. SKIN: no jaundice, no rashes  Assessment/Plan  76 y/o M withPMh basal cell carcinoma, CAD, OSA, GERD, ED, HLD, HTN, who comes for positive iron deficiency anemia and positive Cologuard.  We will proceed with EGD and colonoscopy.  Harvel Quale, MD 05/08/2022, 7:27 AM

## 2022-05-08 NOTE — Anesthesia Postprocedure Evaluation (Signed)
Anesthesia Post Note  Patient: Gregory Suarez  Procedure(s) Performed: COLONOSCOPY WITH PROPOFOL ESOPHAGOGASTRODUODENOSCOPY (EGD) WITH PROPOFOL BIOPSY POLYPECTOMY  Patient location during evaluation: Phase II Anesthesia Type: General Level of consciousness: awake and alert and oriented Pain management: pain level controlled Vital Signs Assessment: post-procedure vital signs reviewed and stable Respiratory status: spontaneous breathing, nonlabored ventilation and respiratory function stable Cardiovascular status: blood pressure returned to baseline and stable Postop Assessment: no apparent nausea or vomiting Anesthetic complications: no   No notable events documented.   Last Vitals:  Vitals:   05/08/22 0635 05/08/22 0818  BP: (!) 165/87 106/60  Pulse:  65  Resp: 19 17  Temp: 36.8 C 36.7 C  SpO2: 97% 98%    Last Pain:  Vitals:   05/08/22 0818  TempSrc: Oral  PainSc: Asleep                 Lael Wetherbee C Gradie Butrick

## 2022-05-08 NOTE — Transfer of Care (Signed)
Immediate Anesthesia Transfer of Care Note  Patient: Gregory Suarez  Procedure(s) Performed: COLONOSCOPY WITH PROPOFOL ESOPHAGOGASTRODUODENOSCOPY (EGD) WITH PROPOFOL BIOPSY POLYPECTOMY  Patient Location: PACU  Anesthesia Type:MAC  Level of Consciousness: drowsy  Airway & Oxygen Therapy: Patient Spontanous Breathing and Patient connected to nasal cannula oxygen  Post-op Assessment: Report given to RN and Post -op Vital signs reviewed and stable  Post vital signs: Reviewed and stable  Last Vitals:  Vitals Value Taken Time  BP 106/60 05/08/22 0818  Temp 36.7 C 05/08/22 0818  Pulse 65 05/08/22 0818  Resp 17 05/08/22 0818  SpO2 98 % 05/08/22 0818    Last Pain:  Vitals:   05/08/22 0818  TempSrc: Oral  PainSc: Asleep      Patients Stated Pain Goal: 8 (29/47/65 4650)  Complications: No notable events documented.

## 2022-05-08 NOTE — Discharge Instructions (Addendum)
You are being discharged home Resume your previous diet We are waiting for your pathology results Your physician has recommended a repeat colonoscopy in 3 years

## 2022-05-08 NOTE — Anesthesia Preprocedure Evaluation (Signed)
Anesthesia Evaluation  Patient identified by MRN, date of birth, ID band Patient awake    Reviewed: Allergy & Precautions, NPO status , Patient's Chart, lab work & pertinent test results, reviewed documented beta blocker date and time   History of Anesthesia Complications Negative for: history of anesthetic complications  Airway Mallampati: III  TM Distance: >3 FB Neck ROM: Full    Dental  (+) Dental Advisory Given, Caps Crowns :   Pulmonary sleep apnea and Continuous Positive Airway Pressure Ventilation , COPD,  COPD inhaler, former smoker,  Pneuthorax   Pulmonary exam normal breath sounds clear to auscultation       Cardiovascular Exercise Tolerance: Good hypertension, Pt. on medications and Pt. on home beta blockers + CAD  Normal cardiovascular exam+ Valvular Problems/Murmurs MR  Rhythm:Regular Rate:Normal  01/2019 CATH Diffuse multivessel coronary calcification 50 to 60% proximal LAD stenosis, diffuse 50% stenosis in the first diagonal branch of the LAD and mild irregularity in the mid distal LAD without significant stenoses; left circumflex vessel with a large OM1 vessel with smooth 60% mid distal OM stenosis proximal to a bifurcation; and mild irregularity with proximal to mid calcification of a dominant RCA with stenoses of approximately 20%.  Preserved LV contractility with EF estimated 55%; LVEDP 16 mmHg.  RECOMMENDATION: Initial medical therapy.  The patient had recently been started on low-dose beta-blocker therapy which may be able to be titrated as blood pressure and heart rate allow.  He was hypertensive initially.  We will add amlodipine 5 mg to his medical regimen.  Recommend aggressive lipid-lowering therapy with increase of atorvastatin to 80 mg in attempt to induce plaque regression.     Neuro/Psych  Neuromuscular disease (RLS) negative psych ROS   GI/Hepatic Neg liver ROS, GERD  Medicated and Controlled,   Endo/Other  negative endocrine ROS  Renal/GU negative Renal ROS  negative genitourinary   Musculoskeletal  (+) Arthritis , Osteoarthritis,    Abdominal   Peds negative pediatric ROS (+)  Hematology negative hematology ROS (+)   Anesthesia Other Findings   Reproductive/Obstetrics negative OB ROS                           Anesthesia Physical Anesthesia Plan  ASA: 3  Anesthesia Plan: General   Post-op Pain Management: Minimal or no pain anticipated   Induction: Intravenous  PONV Risk Score and Plan: Propofol infusion  Airway Management Planned: Nasal Cannula and Natural Airway  Additional Equipment:   Intra-op Plan:   Post-operative Plan:   Informed Consent: I have reviewed the patients History and Physical, chart, labs and discussed the procedure including the risks, benefits and alternatives for the proposed anesthesia with the patient or authorized representative who has indicated his/her understanding and acceptance.     Dental advisory given  Plan Discussed with: CRNA and Surgeon  Anesthesia Plan Comments:         Anesthesia Quick Evaluation

## 2022-05-08 NOTE — Op Note (Signed)
[facility] Patient Name: Gregory Suarez Procedure Date: 05/08/2022 7:13 AM MRN: [id number] Date of Birth: 1946/06/17 Attending MD: Maylon Peppers ,  CSN: [id number] Age: 76 Admit Type: Outpatient Procedure:                Upper GI endoscopy Indications:              Iron deficiency anemia Providers:                Maylon Peppers, Janeece Riggers, RN, Raphael Gibney,                            Technician Referring MD:              Medicines:                Monitored Anesthesia Care Complications:            No immediate complications. Estimated Blood Loss:     Estimated blood loss: none. Procedure:                Pre-Anesthesia Assessment:                           - Prior to the procedure, a History and Physical                            was performed, and patient medications, allergies                            and sensitivities were reviewed. The patient's                            tolerance of previous anesthesia was reviewed.                           - The risks and benefits of the procedure and the                            sedation options and risks were discussed with the                            patient. All questions were answered and informed                            consent was obtained.                           - ASA Grade Assessment: III - A patient with severe                            systemic disease.                           After obtaining informed consent, the endoscope was                            passed under direct vision. Throughout the  procedure, the patient's blood pressure, pulse, and                            oxygen saturations were monitored continuously. The                            [id number]-H190 ([id number]) scope was introduced through the                            mouth, and advanced to the second part of duodenum.                            The upper GI endoscopy was accomplished without                             difficulty. The patient tolerated the procedure                            well. Scope In: 7:35:38 AM Scope Out: 7:43:36 AM Total Procedure Duration: 0 hours 7 minutes 58 seconds  Findings:      A 1 cm hiatal hernia was present.      The Z-line was irregular and was found 40 cm from the incisors. Biopsies       were taken with a cold forceps for histology.      The gastroesophageal flap valve was visualized endoscopically and       classified as Hill Grade IV (no fold, wide open lumen, hiatal hernia       present).      The entire examined stomach was normal. Biopsies were taken with a cold       forceps for Helicobacter pylori testing.      The examined duodenum was normal. Biopsies were taken with a cold       forceps for histology. Impression:               - 1 cm hiatal hernia.                           - Z-line irregular, 40 cm from the incisors.                            Biopsied.                           - Normal stomach. Biopsied.                           - Normal examined duodenum. Biopsied. Moderate Sedation:      Per Anesthesia Care Recommendation:           - Discharge patient to home (ambulatory).                           - Resume previous diet.                           - Await pathology results. Procedure Code(s):        ---  Professional ---                           [phone], Esophagogastroduodenoscopy, flexible,                            transoral; with biopsy, single or multiple Diagnosis Code(s):        --- Professional ---                           K44.9, Diaphragmatic hernia without obstruction or                            gangrene                           K22.8, Other specified diseases of esophagus                           D50.9, Iron deficiency anemia, unspecified CPT copyright 2019 American Medical Association. All rights reserved. The codes documented in this report are preliminary and upon coder review may  be revised to meet current compliance  requirements. Maylon Peppers, MD Maylon Peppers,  05/08/2022 7:47:41 AM This report has been signed electronically. Number of Addenda: 0

## 2022-05-09 DIAGNOSIS — R200 Anesthesia of skin: Secondary | ICD-10-CM | POA: Diagnosis not present

## 2022-05-09 DIAGNOSIS — R202 Paresthesia of skin: Secondary | ICD-10-CM | POA: Diagnosis not present

## 2022-05-09 LAB — SURGICAL PATHOLOGY

## 2022-05-11 ENCOUNTER — Encounter (HOSPITAL_COMMUNITY): Payer: Self-pay | Admitting: Gastroenterology

## 2022-05-31 DIAGNOSIS — R001 Bradycardia, unspecified: Secondary | ICD-10-CM | POA: Diagnosis not present

## 2022-05-31 DIAGNOSIS — R42 Dizziness and giddiness: Secondary | ICD-10-CM | POA: Diagnosis not present

## 2022-05-31 DIAGNOSIS — R251 Tremor, unspecified: Secondary | ICD-10-CM | POA: Diagnosis not present

## 2022-05-31 DIAGNOSIS — G629 Polyneuropathy, unspecified: Secondary | ICD-10-CM | POA: Diagnosis not present

## 2022-05-31 DIAGNOSIS — G4733 Obstructive sleep apnea (adult) (pediatric): Secondary | ICD-10-CM | POA: Diagnosis not present

## 2022-06-01 ENCOUNTER — Other Ambulatory Visit: Payer: Self-pay | Admitting: Student

## 2022-06-01 DIAGNOSIS — R42 Dizziness and giddiness: Secondary | ICD-10-CM

## 2022-06-18 ENCOUNTER — Telehealth: Payer: Self-pay | Admitting: Family Medicine

## 2022-06-18 ENCOUNTER — Other Ambulatory Visit: Payer: Self-pay | Admitting: Family Medicine

## 2022-06-18 MED ORDER — MOLNUPIRAVIR EUA 200MG CAPSULE: 4.0000 | ORAL_CAPSULE | Freq: Two times a day (BID) | ORAL | 0 refills | Status: AC

## 2022-06-18 NOTE — Telephone Encounter (Signed)
Received call from patient's spouse Gregory Suarez to report positive covid test today. Requesting for medication to be called into pharmacy.  Pharmacy confirmed as:  [facility], [place] [address]  [address] [place], [place] [address]  Phone:  [phone]  Fax:  [phone]  DEA #:  [id number]  Please advise at [phone], or [phone].

## 2022-06-19 ENCOUNTER — Ambulatory Visit: Payer: Medicare Other

## 2022-06-21 ENCOUNTER — Ambulatory Visit: Payer: Medicare Other | Admitting: Podiatry

## 2022-06-26 ENCOUNTER — Ambulatory Visit: Payer: Medicare Other

## 2022-06-28 NOTE — Telephone Encounter (Signed)
Received call from Mrs. Madredn to follow up on patient's status; stated he's still testing positive as of this morning. Current sx are coughing, fatigue, and aching; stated chills are gone as of this morning.   Patient has completed his rx of molnupiravir as of 5 days ago.  Requesting call back.  Pharmacy confirmed as:  [id number] [place] [address] [place] [address] Phone:  [phone] Fax:  [phone] DEA #:  [id number] Please advise at [phone] or [phone]

## 2022-07-03 ENCOUNTER — Ambulatory Visit (INDEPENDENT_AMBULATORY_CARE_PROVIDER_SITE_OTHER): Payer: Non-veteran care | Admitting: Gastroenterology

## 2022-07-04 DIAGNOSIS — G20C Parkinsonism, unspecified: Secondary | ICD-10-CM | POA: Diagnosis not present

## 2022-07-04 DIAGNOSIS — R251 Tremor, unspecified: Secondary | ICD-10-CM | POA: Diagnosis not present

## 2022-07-05 DIAGNOSIS — M12819 Other specific arthropathies, not elsewhere classified, unspecified shoulder: Secondary | ICD-10-CM | POA: Insufficient documentation

## 2022-07-05 DIAGNOSIS — M160 Bilateral primary osteoarthritis of hip: Secondary | ICD-10-CM | POA: Diagnosis not present

## 2022-07-05 DIAGNOSIS — M25551 Pain in right hip: Secondary | ICD-10-CM | POA: Diagnosis not present

## 2022-07-05 DIAGNOSIS — M25511 Pain in right shoulder: Secondary | ICD-10-CM | POA: Diagnosis not present

## 2022-07-06 ENCOUNTER — Ambulatory Visit
Admission: RE | Admit: 2022-07-06 | Discharge: 2022-07-06 | Disposition: A | Discharge status: Home / Self Care | Payer: Medicare Other | Source: Ambulatory Visit | Attending: Student | Admitting: Student

## 2022-07-06 DIAGNOSIS — R42 Dizziness and giddiness: Secondary | ICD-10-CM | POA: Diagnosis not present

## 2022-07-19 DIAGNOSIS — M1611 Unilateral primary osteoarthritis, right hip: Secondary | ICD-10-CM | POA: Diagnosis not present

## 2022-08-02 ENCOUNTER — Ambulatory Visit (INDEPENDENT_AMBULATORY_CARE_PROVIDER_SITE_OTHER): Payer: Medicare Other | Admitting: Podiatry

## 2022-08-02 ENCOUNTER — Encounter: Payer: Self-pay | Admitting: Podiatry

## 2022-08-02 DIAGNOSIS — M79675 Pain in left toe(s): Secondary | ICD-10-CM

## 2022-08-02 DIAGNOSIS — M79674 Pain in right toe(s): Secondary | ICD-10-CM | POA: Diagnosis not present

## 2022-08-02 DIAGNOSIS — B351 Tinea unguium: Secondary | ICD-10-CM | POA: Diagnosis not present

## 2022-08-02 DIAGNOSIS — L608 Other nail disorders: Secondary | ICD-10-CM

## 2022-08-02 NOTE — Progress Notes (Signed)
This patient presents to the office with chief complaint of long thick painful nails.  Patient says the nails are painful walking and wearing shoes.  This patient is unable to self treat.  This patient is unable to trim his  nails since she is unable to reach his nails.  She presents to the office for preventative foot care services.  General Appearance  Alert, conversant and in no acute stress.  Vascular  Dorsalis pedis and posterior tibial  pulses are absent   bilaterally.  Capillary return is within normal limits  bilaterally. Temperature is within normal limits  bilaterally.  Neurologic  Senn-Weinstein monofilament wire test within normal limits  bilaterally. Muscle power within normal limits bilaterally.  Nails Thick disfigured discolored nails with subungual debris hallux nails . No evidence of bacterial infection or drainage bilaterally.  Orthopedic  No limitations of motion  feet .  No crepitus or effusions noted.  No bony pathology or digital deformities noted. Mild HAV  B/L.  Hammer toes with deviated toe 3,4  B/L.  Prominent metsatarsal heads.  Cavus foot type  B/L.  Skin  normotropic skin with no porokeratosis noted bilaterally.  No signs of infections or ulcers noted.     Onychomycosis  Nails  B/L.  Pain in right toes  Pain in left toes  Debridement of nails both feet followed trimming the nails with dremel tool.    RTC 3 months.   Eve Rey DPM   

## 2022-08-16 ENCOUNTER — Telehealth: Payer: Self-pay | Admitting: Family Medicine

## 2022-08-16 NOTE — Telephone Encounter (Signed)
error 

## 2022-08-29 DIAGNOSIS — G2581 Restless legs syndrome: Secondary | ICD-10-CM | POA: Diagnosis not present

## 2022-08-29 DIAGNOSIS — G629 Polyneuropathy, unspecified: Secondary | ICD-10-CM | POA: Diagnosis not present

## 2022-08-29 DIAGNOSIS — E538 Deficiency of other specified B group vitamins: Secondary | ICD-10-CM | POA: Diagnosis not present

## 2022-08-29 DIAGNOSIS — R2689 Other abnormalities of gait and mobility: Secondary | ICD-10-CM | POA: Diagnosis not present

## 2022-10-10 DIAGNOSIS — M25511 Pain in right shoulder: Secondary | ICD-10-CM | POA: Diagnosis not present

## 2022-10-10 DIAGNOSIS — M25512 Pain in left shoulder: Secondary | ICD-10-CM | POA: Diagnosis not present

## 2022-10-11 ENCOUNTER — Other Ambulatory Visit: Payer: Self-pay

## 2022-10-11 ENCOUNTER — Telehealth: Payer: Self-pay | Admitting: Family Medicine

## 2022-10-11 NOTE — Telephone Encounter (Signed)
Received refill request from pharmacy for refill or new script of  tiZANidine (ZANAFLEX) 4 MG tablet [[id number]]   LOV: 12/19/2021  Pharmacy:  [id number]  [place], [place] [address] Phone: [phone] Fax: [phone]  Please advise pharmacist.

## 2022-10-12 ENCOUNTER — Other Ambulatory Visit: Payer: Self-pay | Admitting: Family Medicine

## 2022-10-12 MED ORDER — TIZANIDINE HCL 4 MG PO TABS
4.0000 mg | ORAL_TABLET | Freq: Three times a day (TID) | ORAL | 1 refills | Status: DC | PRN
Start: 2022-10-12 — End: 2023-06-07

## 2022-11-15 ENCOUNTER — Encounter: Payer: Self-pay | Admitting: Podiatry

## 2022-11-15 ENCOUNTER — Ambulatory Visit (INDEPENDENT_AMBULATORY_CARE_PROVIDER_SITE_OTHER): Payer: Medicare Other | Admitting: Podiatry

## 2022-11-15 VITALS — BP 117/68 | HR 52

## 2022-11-15 DIAGNOSIS — L608 Other nail disorders: Secondary | ICD-10-CM | POA: Diagnosis not present

## 2022-11-15 DIAGNOSIS — M79675 Pain in left toe(s): Secondary | ICD-10-CM | POA: Diagnosis not present

## 2022-11-15 DIAGNOSIS — B351 Tinea unguium: Secondary | ICD-10-CM | POA: Diagnosis not present

## 2022-11-15 DIAGNOSIS — M79674 Pain in right toe(s): Secondary | ICD-10-CM

## 2022-11-15 NOTE — Progress Notes (Signed)
This patient presents to the office with chief complaint of long thick painful nails.  Patient says the nails are painful walking and wearing shoes.  This patient is unable to self treat.  This patient is unable to trim his  nails since she is unable to reach his nails.  She presents to the office for preventative foot care services.  General Appearance  Alert, conversant and in no acute stress.  Vascular  Dorsalis pedis and posterior tibial  pulses are absent   bilaterally.  Capillary return is within normal limits  bilaterally. Temperature is within normal limits  bilaterally.  Neurologic  Senn-Weinstein monofilament wire test within normal limits  bilaterally. Muscle power within normal limits bilaterally.  Nails Thick disfigured discolored nails with subungual debris hallux nails . No evidence of bacterial infection or drainage bilaterally.  Orthopedic  No limitations of motion  feet .  No crepitus or effusions noted.  No bony pathology or digital deformities noted. Mild HAV  B/L.  Hammer toes with deviated toe 3,4  B/L.  Prominent metsatarsal heads.  Cavus foot type  B/L.  Skin  normotropic skin with no porokeratosis noted bilaterally.  No signs of infections or ulcers noted.     Onychomycosis  Nails  B/L.  Pain in right toes  Pain in left toes  Debridement of nails both feet followed trimming the nails with dremel tool.    RTC 3 months.   Gardiner Barefoot DPM

## 2022-12-04 ENCOUNTER — Telehealth: Payer: Self-pay | Admitting: *Deleted

## 2022-12-04 DIAGNOSIS — M19111 Post-traumatic osteoarthritis, right shoulder: Secondary | ICD-10-CM | POA: Diagnosis not present

## 2022-12-04 NOTE — Telephone Encounter (Signed)
Patient states that he is not planning to have surgery until May, patient states that he would like to see Dr. Claiborne Billings. Patient is scheduled to see Dr. Claiborne Billings on 01/29/23 for yearly visit and preop clearance

## 2022-12-04 NOTE — Telephone Encounter (Signed)
    Primary Cardiologist:Thomas Claiborne Billings, MD  Chart reviewed as part of pre-operative protocol coverage. Because of Gregory Suarez past medical history and time since last visit, he/she will require a follow-up Office visit in order to better assess preoperative cardiovascular risk.  Pre-op covering staff: - Please schedule appointment and call patient to inform them. - Please contact requesting surgeon's office via preferred method (i.e, phone, fax) to inform them of need for appointment prior to surgery.  If applicable, this message will also be routed to pharmacy pool and/or primary cardiologist for input on holding anticoagulant/antiplatelet agent as requested below so that this information is available at time of patient's appointment.   Deberah Pelton, NP  12/04/2022, 4:38 PM

## 2022-12-04 NOTE — Telephone Encounter (Signed)
   Pre-operative Risk Assessment    Patient Name: Gregory Suarez  DOB: Nov 06, 1945 MRN: [id number]      Request for Surgical Clearance    Procedure:   RIGHT REVERSE TSA  Date of Surgery:  Clearance TBD                                 Surgeon:  DR. Esmond Plants  Surgeon's Group or Practice Name:  [facility]  Phone number:  [phone] ATTN: [place]  Fax number:  [phone]   Type of Clearance Requested:   - Medical ; ASA    Type of Anesthesia:  General    Additional requests/questions:    Gregory Suarez   12/04/2022, 4:24 PM

## 2022-12-06 ENCOUNTER — Telehealth: Payer: Self-pay

## 2022-12-06 NOTE — Telephone Encounter (Signed)
Form received from [facility] for surgical clearance. Form placed in green folder. Mjp,lpn

## 2022-12-19 ENCOUNTER — Other Ambulatory Visit: Payer: Medicare Other

## 2022-12-19 DIAGNOSIS — I10 Essential (primary) hypertension: Secondary | ICD-10-CM

## 2022-12-19 DIAGNOSIS — Z125 Encounter for screening for malignant neoplasm of prostate: Secondary | ICD-10-CM

## 2022-12-19 DIAGNOSIS — R790 Abnormal level of blood mineral: Secondary | ICD-10-CM

## 2022-12-19 DIAGNOSIS — R5383 Other fatigue: Secondary | ICD-10-CM

## 2022-12-19 DIAGNOSIS — G2581 Restless legs syndrome: Secondary | ICD-10-CM

## 2022-12-19 DIAGNOSIS — I2510 Atherosclerotic heart disease of native coronary artery without angina pectoris: Secondary | ICD-10-CM

## 2022-12-19 DIAGNOSIS — E785 Hyperlipidemia, unspecified: Secondary | ICD-10-CM

## 2022-12-19 DIAGNOSIS — N400 Enlarged prostate without lower urinary tract symptoms: Secondary | ICD-10-CM

## 2022-12-20 LAB — COMPLETE METABOLIC PANEL WITH GFR
AG Ratio: 1.8 (calc) (ref 1.0–2.5)
ALT: 14 U/L (ref 9–46)
AST: 23 U/L (ref 10–35)
Albumin: 4.1 g/dL (ref 3.6–5.1)
Alkaline phosphatase (APISO): 100 U/L (ref 35–144)
BUN: 14 mg/dL (ref 7–25)
CO2: 25 mmol/L (ref 20–32)
Calcium: 9.2 mg/dL (ref 8.6–10.3)
Chloride: 106 mmol/L (ref 98–110)
Creat: 0.86 mg/dL (ref 0.70–1.28)
Globulin: 2.3 g/dL (calc) (ref 1.9–3.7)
Glucose, Bld: 84 mg/dL (ref 65–99)
Potassium: 4 mmol/L (ref 3.5–5.3)
Sodium: 144 mmol/L (ref 135–146)
Total Bilirubin: 0.9 mg/dL (ref 0.2–1.2)
Total Protein: 6.4 g/dL (ref 6.1–8.1)
eGFR: 90 mL/min/{1.73_m2} (ref >=60)

## 2022-12-20 LAB — CBC WITH DIFFERENTIAL/PLATELET
Absolute Monocytes: 679 cells/uL (ref 200–950)
Basophils Absolute: 39 cells/uL (ref 0–200)
Basophils Relative: 0.5 %
Eosinophils Absolute: 226 cells/uL (ref 15–500)
Eosinophils Relative: 2.9 %
HCT: 45.5 % (ref 38.5–50.0)
Hemoglobin: 15.3 g/dL (ref 13.2–17.1)
Lymphs Abs: 967 cells/uL (ref 850–3900)
MCH: 31 pg (ref 27.0–33.0)
MCHC: 33.6 g/dL (ref 32.0–36.0)
MCV: 92.3 fL (ref 80.0–100.0)
MPV: 9.5 fL (ref 7.5–12.5)
Monocytes Relative: 8.7 %
Neutro Abs: 5889 cells/uL (ref 1500–7800)
Neutrophils Relative %: 75.5 %
Platelets: 206 10*3/uL (ref 140–400)
RBC: 4.93 10*6/uL (ref 4.20–5.80)
RDW: 13.3 % (ref 11.0–15.0)
Total Lymphocyte: 12.4 %
WBC: 7.8 10*3/uL (ref 3.8–10.8)

## 2022-12-20 LAB — LIPID PANEL
Cholesterol: 104 mg/dL (ref <200)
HDL: 40 mg/dL (ref >=40)
LDL Cholesterol (Calc): 47 mg/dL (calc)
Non-HDL Cholesterol (Calc): 64 mg/dL (calc) (ref <130)
Total CHOL/HDL Ratio: 2.6 (calc) (ref <5.0)
Triglycerides: 88 mg/dL (ref <150)

## 2022-12-20 LAB — PSA: PSA: 0.5 ng/mL (ref <=4.00)

## 2022-12-25 ENCOUNTER — Encounter: Payer: Self-pay | Admitting: Family Medicine

## 2022-12-25 ENCOUNTER — Ambulatory Visit (INDEPENDENT_AMBULATORY_CARE_PROVIDER_SITE_OTHER): Payer: Medicare Other | Admitting: Family Medicine

## 2022-12-25 VITALS — BP 118/62 | HR 61 | Temp 98.1°F | Ht 75.0 in | Wt 244.0 lb

## 2022-12-25 DIAGNOSIS — M6281 Muscle weakness (generalized): Secondary | ICD-10-CM

## 2022-12-25 DIAGNOSIS — Z0001 Encounter for general adult medical examination with abnormal findings: Secondary | ICD-10-CM | POA: Diagnosis not present

## 2022-12-25 DIAGNOSIS — R5383 Other fatigue: Secondary | ICD-10-CM | POA: Diagnosis not present

## 2022-12-25 DIAGNOSIS — Z125 Encounter for screening for malignant neoplasm of prostate: Secondary | ICD-10-CM | POA: Diagnosis not present

## 2022-12-25 DIAGNOSIS — I10 Essential (primary) hypertension: Secondary | ICD-10-CM | POA: Diagnosis not present

## 2022-12-25 DIAGNOSIS — Z0000 Encounter for general adult medical examination without abnormal findings: Secondary | ICD-10-CM

## 2022-12-25 DIAGNOSIS — E785 Hyperlipidemia, unspecified: Secondary | ICD-10-CM | POA: Diagnosis not present

## 2022-12-25 DIAGNOSIS — I2510 Atherosclerotic heart disease of native coronary artery without angina pectoris: Secondary | ICD-10-CM

## 2022-12-25 NOTE — Progress Notes (Signed)
Subjective:    Patient ID: Gregory Suarez, male    DOB: January 20, 1946, 77 y.o.   MRN: [id number]  HPI  Patient is a very pleasant 77 year old Caucasian gentleman here today for an annual wellness visit.  Patient had 3 polyps last year on his colonoscopy.  They recommended a repeat colonoscopy in 3 years so he is due in 2026.  His most recent PSA was excellent at 0.5.  He is due for a COVID booster.  The remainder of his vaccinations are up-to-date including the RSV vaccine.  He is dealing with muscle weakness.  He states that his leg muscles and his arm muscles are getting progressively weaker.  He also reports fatigue and lack of energy. Immunization History  Administered Date(s) Administered   Fluad Quad(high Dose 65+) 07/23/2019   Influenza, High Dose Seasonal PF 07/24/2018   PFIZER(Purple Top)SARS-COV-2 Vaccination 10/27/2019, 11/17/2019, 07/26/2020   Pneumococcal Conjugate-13 01/16/2014   Pneumococcal Polysaccharide-23 06/01/2010, 03/02/2011, 04/01/2011, 02/04/2015, 08/15/2020   Td 03/31/2009   Tdap 05/24/2009, 10/01/2010   Zoster Recombinat (Shingrix) 08/15/2020, 10/20/2020   Zoster, Live 07/06/2011, 03/01/2014    Lab on 12/19/2022  Component Date Value Ref Range Status   PSA 12/19/2022 0.50  < OR = 4.00 ng/mL Final   Comment: The total PSA value from this assay system is  standardized against the WHO standard. The test  result will be approximately 20% lower when compared  to the equimolar-standardized total PSA (Beckman  Coulter). Comparison of serial PSA results should be  interpreted with this fact in mind. . This test was performed using the Siemens  chemiluminescent method. Values obtained from  different assay methods cannot be used interchangeably. PSA levels, regardless of value, should not be interpreted as absolute evidence of the presence or absence of disease.    Cholesterol 12/19/2022 104  <200 mg/dL Final   HDL 12/19/2022 40  > OR = 40 mg/dL Final    Triglycerides 12/19/2022 88  <150 mg/dL Final   LDL Cholesterol (Calc) 12/19/2022 47  mg/dL (calc) Final   Comment: Reference range: <100 . Desirable range <100 mg/dL for primary prevention;   <70 mg/dL for patients with CHD or diabetic patients  with > or = 2 CHD risk factors. Marland Kitchen LDL-C is now calculated using the Martin-Hopkins  calculation, which is a validated novel method providing  better accuracy than the Friedewald equation in the  estimation of LDL-C.  Cresenciano Genre et al. Annamaria Helling. MU:[id number]): 2061-2068  (http://education.[facility].com/faq/FAQ164)    Total CHOL/HDL Ratio 12/19/2022 2.6  <5.0 (calc) Final   Non-HDL Cholesterol (Calc) 12/19/2022 64  <130 mg/dL (calc) Final   Comment: For patients with diabetes plus 1 major ASCVD risk  factor, treating to a non-HDL-C goal of <100 mg/dL  (LDL-C of <70 mg/dL) is considered a therapeutic  option.    Glucose, Bld 12/19/2022 84  65 - 99 mg/dL Final   Comment: .            Fasting reference interval .    BUN 12/19/2022 14  7 - 25 mg/dL Final   Creat 12/19/2022 0.86  0.70 - 1.28 mg/dL Final   eGFR 12/19/2022 90  > OR = 60 mL/min/1.53m2 Final   BUN/Creatinine Ratio 12/19/2022 SEE NOTE:  6 - 22 (calc) Final   Comment:    Not Reported: BUN and Creatinine are within    reference range. .    Sodium 12/19/2022 144  135 - 146 mmol/L Final   Potassium 12/19/2022 4.0  3.5 -  5.3 mmol/L Final   Chloride 12/19/2022 106  98 - 110 mmol/L Final   CO2 12/19/2022 25  20 - 32 mmol/L Final   Calcium 12/19/2022 9.2  8.6 - 10.3 mg/dL Final   Total Protein 12/19/2022 6.4  6.1 - 8.1 g/dL Final   Albumin 12/19/2022 4.1  3.6 - 5.1 g/dL Final   Globulin 12/19/2022 2.3  1.9 - 3.7 g/dL (calc) Final   AG Ratio 12/19/2022 1.8  1.0 - 2.5 (calc) Final   Total Bilirubin 12/19/2022 0.9  0.2 - 1.2 mg/dL Final   Alkaline phosphatase (APISO) 12/19/2022 100  35 - 144 U/L Final   AST 12/19/2022 23  10 - 35 U/L Final   ALT 12/19/2022 14  9 - 46 U/L Final    WBC 12/19/2022 7.8  3.8 - 10.8 Thousand/uL Final   RBC 12/19/2022 4.93  4.20 - 5.80 Million/uL Final   Hemoglobin 12/19/2022 15.3  13.2 - 17.1 g/dL Final   HCT 12/19/2022 45.5  38.5 - 50.0 % Final   MCV 12/19/2022 92.3  80.0 - 100.0 fL Final   MCH 12/19/2022 31.0  27.0 - 33.0 pg Final   MCHC 12/19/2022 33.6  32.0 - 36.0 g/dL Final   RDW 12/19/2022 13.3  11.0 - 15.0 % Final   Platelets 12/19/2022 206  140 - 400 Thousand/uL Final   MPV 12/19/2022 9.5  7.5 - 12.5 fL Final   Neutro Abs 12/19/2022 5,889  1,500 - 7,800 cells/uL Final   Lymphs Abs 12/19/2022 967  850 - 3,900 cells/uL Final   Absolute Monocytes 12/19/2022 679  200 - 950 cells/uL Final   Eosinophils Absolute 12/19/2022 226  15 - 500 cells/uL Final   Basophils Absolute 12/19/2022 39  0 - 200 cells/uL Final   Neutrophils Relative % 12/19/2022 75.5  % Final   Total Lymphocyte 12/19/2022 12.4  % Final   Monocytes Relative 12/19/2022 8.7  % Final   Eosinophils Relative 12/19/2022 2.9  % Final   Basophils Relative 12/19/2022 0.5  % Final    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   right temple : sccis - shave   Tubular adenoma of colon    Past Surgical History:  Procedure Laterality Date   ANKLE SURGERY     BIOPSY  05/08/2022   Procedure: BIOPSY;  Surgeon: Harvel Quale, MD;  [facility];  Service: Gastroenterology;;   CHOLECYSTECTOMY     CHOLECYSTECTOMY     COLONOSCOPY WITH  PROPOFOL N/A 05/08/2022   Procedure: COLONOSCOPY WITH PROPOFOL;  Surgeon: Harvel Quale, MD;  Location: [facility];  Service: Gastroenterology;  Laterality: N/A;  730 ASA 2   ESOPHAGOGASTRODUODENOSCOPY (EGD) WITH PROPOFOL N/A 05/08/2022   Procedure: ESOPHAGOGASTRODUODENOSCOPY (EGD) WITH PROPOFOL;  Surgeon: Harvel Quale, MD;  [facility];  Service: Gastroenterology;  Laterality: N/A;   LEFT HEART CATH AND CORONARY  ANGIOGRAPHY N/A 02/13/2019   Procedure: LEFT HEART CATH AND CORONARY ANGIOGRAPHY;  Surgeon: Troy Sine, MD;  Location: [facility];  Service: Cardiovascular;  Laterality: N/A;   POLYPECTOMY  05/08/2022   Procedure: POLYPECTOMY;  Surgeon: Montez Morita, Quillian Quince, MD;  Location: [facility];  Service: Gastroenterology;;   Current Outpatient Medications on File Prior to Visit  Medication Sig Dispense Refill   acetaminophen (TYLENOL) 500 MG tablet Take 500-1,000 mg by mouth every 6 (six) hours as needed (pain.).     amLODipine (NORVASC) 2.5 MG tablet TAKE 1 TABLET BY MOUTH EVERY DAY (Patient taking differently: Take 2.5 mg by mouth every evening.) 90 tablet 3   aspirin EC 81 MG tablet Take 1 tablet (81 mg total) by mouth daily. 90 tablet 3   atorvastatin (LIPITOR) 80 MG tablet TAKE 1 TABLET BY MOUTH EVERY DAY (Patient taking differently: Take 80 mg by mouth at bedtime.) 90 tablet 3   benzonatate (TESSALON) 100 MG capsule Take 100 mg by mouth 3 (three) times daily as needed for cough.     Carbidopa-Levodopa ER (SINEMET CR) 25-100 MG tablet controlled release Take 1 tablet by mouth at bedtime.     CHLORPHENIRAMINE MALEATE PO Take 8 mg by mouth at bedtime.      Cholecalciferol (VITAMIN D-3) 125 MCG (5000 UT) TABS Take 5,000 Units by mouth in the morning.     cyanocobalamin (VITAMIN B12) 1000 MCG/ML injection Inject 1,000 mcg into the muscle once a week.     ferrous sulfate 325 (65 FE) MG tablet Take 325 mg by mouth daily with breakfast.      finasteride (PROSCAR) 5 MG tablet TAKE 1 TABLET (5 MG TOTAL) BY MOUTH DAILY. (Patient taking differently: Take 5 mg by mouth every evening.) 90 tablet 3   hydrochlorothiazide (MICROZIDE) 12.5 MG capsule Take 12.5 mg by mouth daily as needed (fluid/swelling).     hydrocortisone 2.5 % cream Apply topically as directed. Apply to scaly rash on face hs on Tuesday, Thursday, and Saturday (Patient taking differently: Apply 1 Application topically 3 (three) times a week. Apply to scaly rash on face hs on Tuesday, Thursday, and Saturday) 30 g 6   ketoconazole (NIZORAL) 2 % cream Apply 1 application. topically daily. Qhs to feet for 1 month, Apply to scaly areas on face 3 times weekly, Monday, Wednesday and Friday (Patient taking differently: Apply 1 application  topically every Monday, Wednesday, and Friday. Qhs to feet for 1 month, Apply to scaly areas on face 3 times weekly, Monday, Wednesday and Friday) 60 g 11   meclizine (ANTIVERT) 25 MG tablet Take 25 mg by mouth 3 (three) times daily as needed for dizziness.     Melatonin 5 MG TABS Take 10 mg by mouth at bedtime.      Menatetrenone (VITAMIN K2) 100 MCG TABS Take by mouth in the morning.     metoprolol tartrate (LOPRESSOR) 25 MG tablet TAKE 1 TABLET BY MOUTH TWICE A DAY 180 tablet 3   mometasone (ELOCON) 0.1 % cream APPLY TO AFFECTED AREA TWICE A DAY (Patient taking differently: Apply 1 Application topically daily as needed (skin irritation.).) 45 g 0   Nutritional Supplements (PRO-PEPTIDE PO) Take 1 capsule by mouth in the morning.  BPC 157 (gastric pentadecapeptide)     pantoprazole (PROTONIX) 40 MG tablet TAKE 1 TABLET BY MOUTH TWICE A DAY 180 tablet 3   pramipexole (MIRAPEX) 0.25 MG tablet TAKE 2 TABLETS (0.5 MG TOTAL) BY MOUTH AT BEDTIME. 180 tablet  1   pyridOXINE (VITAMIN B6) 100 MG tablet Take 100 mg by mouth in the morning.     tadalafil (CIALIS) 5 MG tablet TAKE ONE TABLET BY MOUTH ONE TIME DAILY (Patient taking differently: Take 5 mg by mouth in  the morning.) 90 tablet 2   terbinafine (LAMISIL) 250 MG tablet Take 1 tablet (250 mg total) by mouth daily. 30 tablet 1   tiZANidine (ZANAFLEX) 4 MG tablet Take 1 tablet (4 mg total) by mouth every 8 (eight) hours as needed for muscle spasms. 30 tablet 1   valACYclovir (VALTREX) 1000 MG tablet Take 1,000 mg by mouth daily as needed (outbreak).     No current facility-administered medications on file prior to visit.   Allergies  Allergen Reactions   Crestor [Rosuvastatin] Rash and Other (See Comments)    "Did not feel well"    Social History   Socioeconomic History   Marital status: Married    Spouse name: Not on file   Number of children: Not on file   Years of education: Not on file   Highest education level: Not on file  Occupational History   Occupation: RETIRED  Tobacco Use   Smoking status: Former    Packs/day: 0.50    Years: 15.00    Additional pack years: 0.00    Total pack years: 7.50    Types: Cigarettes    Quit date: 10/01/1988    Years since quitting: 34.2    Passive exposure: Past   Smokeless tobacco: Never  Substance and Sexual Activity   Alcohol use: No    Alcohol/week: 0.0 standard drinks of alcohol   Drug use: No   Sexual activity: Yes    Comment: married, 2 grown sons, retired maintenance man  Other Topics Concern   Not on file  Social History Narrative   Not on file   Social Determinants of Health   Financial Resource Strain: Low Risk  (05/13/2020)   Overall Financial Resource Strain (CARDIA)    Difficulty of Paying Living Expenses: Not very hard  Food Insecurity: Not on file  Transportation Needs: Not on file  Physical Activity: Not on file  Stress: Not on file  Social Connections: Not on file  Intimate Partner Violence: Not on file   Family History  Problem Relation Age of Onset   Diabetes Other    Breast cancer Other    Coronary artery disease Other       Review of Systems  All other systems reviewed and are negative.       Objective:   Physical Exam Vitals reviewed.  Constitutional:      General: He is not in acute distress.    Appearance: He is well-developed. He is not diaphoretic.  HENT:     Head: Normocephalic and atraumatic.     Right Ear: External ear normal.     Left Ear: External ear normal.     Nose: Nose normal.     Mouth/Throat:     Pharynx: No oropharyngeal exudate.  Eyes:     General: No scleral icterus.       Right eye: No discharge.        Left eye: No discharge.     Conjunctiva/sclera: Conjunctivae normal.     Pupils: Pupils are equal, round, and reactive to light.  Neck:     Thyroid: No thyromegaly.     Vascular: No JVD.     Trachea: No tracheal deviation.  Cardiovascular:     Rate and  Rhythm: Normal rate and regular rhythm.     Heart sounds: Normal heart sounds. No murmur heard.    No friction rub. No gallop.  Pulmonary:     Effort: Pulmonary effort is normal. No respiratory distress.     Breath sounds: Normal breath sounds. No stridor. No wheezing or rales.  Chest:     Chest wall: No tenderness.  Abdominal:     General: Bowel sounds are normal. There is no distension.     Palpations: Abdomen is soft. There is no mass.     Tenderness: There is no abdominal tenderness. There is no guarding or rebound.  Musculoskeletal:        General: No tenderness. Normal range of motion.     Cervical back: Normal range of motion and neck supple.  Lymphadenopathy:     Cervical: No cervical adenopathy.  Skin:    General: Skin is warm.     Coloration: Skin is not pale.     Findings: No erythema or rash.  Neurological:     Mental Status: He is alert and oriented to person, place, and time.     Cranial Nerves: No cranial nerve deficit.     Motor: No abnormal muscle tone.     Coordination: Coordination normal.     Deep Tendon Reflexes: Reflexes are normal and symmetric.  Psychiatric:        Behavior: Behavior normal.        Thought Content: Thought content normal.        Judgment:  Judgment normal.           Assessment & Plan:    Fatigue, unspecified type - Plan: Vitamin B12, TSH, Testosterone Total,Free,Bio, Males  Muscle weakness - Plan: CK  General medical exam  Coronary artery disease involving native coronary artery without angina pectoris, unspecified whether native or transplanted heart  Prostate cancer screening  Essential hypertension  Hyperlipidemia, unspecified hyperlipidemia type Cancer screening is up-to-date.  Colonoscopy is due in 2026 and PSA was excellent.  Immunizations are up-to-date except for the most recent COVID-vaccine.  His RSV vaccination is up-to-date.  His lab work is outstanding including his LDL cholesterol.  His blood pressure is excellent.  Given his fatigue and muscle weakness I will check a CK level to rule out statin induced myopathy.  I will also check for B12 deficiency as well as hypothyroidism hypergonadism.

## 2022-12-26 ENCOUNTER — Encounter: Payer: Self-pay | Admitting: Dermatology

## 2022-12-26 ENCOUNTER — Ambulatory Visit (INDEPENDENT_AMBULATORY_CARE_PROVIDER_SITE_OTHER): Payer: Medicare Other | Admitting: Dermatology

## 2022-12-26 VITALS — BP 135/66

## 2022-12-26 DIAGNOSIS — D692 Other nonthrombocytopenic purpura: Secondary | ICD-10-CM | POA: Diagnosis not present

## 2022-12-26 DIAGNOSIS — L821 Other seborrheic keratosis: Secondary | ICD-10-CM

## 2022-12-26 DIAGNOSIS — L918 Other hypertrophic disorders of the skin: Secondary | ICD-10-CM | POA: Diagnosis not present

## 2022-12-26 DIAGNOSIS — Z85828 Personal history of other malignant neoplasm of skin: Secondary | ICD-10-CM

## 2022-12-26 DIAGNOSIS — L814 Other melanin hyperpigmentation: Secondary | ICD-10-CM | POA: Diagnosis not present

## 2022-12-26 DIAGNOSIS — Z79899 Other long term (current) drug therapy: Secondary | ICD-10-CM

## 2022-12-26 DIAGNOSIS — D229 Melanocytic nevi, unspecified: Secondary | ICD-10-CM | POA: Diagnosis not present

## 2022-12-26 DIAGNOSIS — L820 Inflamed seborrheic keratosis: Secondary | ICD-10-CM | POA: Diagnosis not present

## 2022-12-26 DIAGNOSIS — Z1283 Encounter for screening for malignant neoplasm of skin: Secondary | ICD-10-CM

## 2022-12-26 DIAGNOSIS — L570 Actinic keratosis: Secondary | ICD-10-CM

## 2022-12-26 DIAGNOSIS — L209 Atopic dermatitis, unspecified: Secondary | ICD-10-CM

## 2022-12-26 DIAGNOSIS — L578 Other skin changes due to chronic exposure to nonionizing radiation: Secondary | ICD-10-CM

## 2022-12-26 DIAGNOSIS — D1801 Hemangioma of skin and subcutaneous tissue: Secondary | ICD-10-CM

## 2022-12-26 LAB — TESTOSTERONE TOTAL,FREE,BIO, MALES
Albumin: 4.1 g/dL (ref 3.6–5.1)
Sex Hormone Binding: 76 nmol/L (ref 22–77)
Testosterone, Bioavailable: 90.2 ng/dL (ref 15.0–150.0)
Testosterone, Free: 47.9 pg/mL (ref 6.0–73.0)
Testosterone: 715 ng/dL (ref 250–827)

## 2022-12-26 LAB — TSH: TSH: 1.54 mIU/L (ref 0.40–4.50)

## 2022-12-26 LAB — VITAMIN B12: Vitamin B-12: 495 pg/mL (ref 200–1100)

## 2022-12-26 LAB — CK: Total CK: 38 U/L — ABNORMAL LOW (ref 44–196)

## 2022-12-26 MED ORDER — MOMETASONE FUROATE 0.1 % EX CREA: 1.0000 | TOPICAL_CREAM | Freq: Every day | CUTANEOUS | 3 refills | Status: DC

## 2022-12-26 NOTE — Patient Instructions (Signed)
Recommend 1% hydrocortisone cream to ears daily 3 times per week.  Cryotherapy Aftercare  Wash gently with soap and water everyday.   Apply Vaseline and Band-Aid daily until healed.    Due to recent changes in healthcare laws, you may see results of your pathology and/or laboratory studies on MyChart before the doctors have had a chance to review them. We understand that in some cases there may be results that are confusing or concerning to you. Please understand that not all results are received at the same time and often the doctors may need to interpret multiple results in order to provide you with the best plan of care or course of treatment. Therefore, we ask that you please give [place] 2 business days to thoroughly review all your results before contacting the office for clarification. Should we see a critical lab result, you will be contacted sooner.   If You Need Anything After Your Visit  If you have any questions or concerns for your doctor, please call our main line at [phone] and press option 4 to reach your doctor's medical assistant. If no one answers, please leave a voicemail as directed and we will return your call as soon as possible. Messages left after 4 pm will be answered the following business day.   You may also send [place] a message via [place]. We typically respond to MyChart messages within 1-2 business days.  For prescription refills, please ask your pharmacy to contact our office. Our fax number is [phone].  If you have an urgent issue when the clinic is closed that cannot wait until the next business day, you can page your doctor at the number below.    Please note that while we do our best to be available for urgent issues outside of office hours, we are not available 24/7.   If you have an urgent issue and are unable to reach [place], you may choose to seek medical care at your doctor's office, retail clinic, urgent care center, or emergency room.  If you have a  medical emergency, please immediately call 911 or go to the emergency department.  Pager Numbers  - Dr. Nehemiah Massed: [phone]  - Dr. Laurence Ferrari: [phone]  - Dr. Nicole Kindred: [phone]  In the event of inclement weather, please call our main line at [phone] for an update on the status of any delays or closures.  Dermatology Medication Tips: Please keep the boxes that topical medications come in in order to help keep track of the instructions about where and how to use these. Pharmacies typically print the medication instructions only on the boxes and not directly on the medication tubes.   If your medication is too expensive, please contact our office at [phone] option 4 or send [place] a message through [place].   We are unable to tell what your co-pay for medications will be in advance as this is different depending on your insurance coverage. However, we may be able to find a substitute medication at lower cost or fill out paperwork to get insurance to cover a needed medication.   If a prior authorization is required to get your medication covered by your insurance company, please allow [place] 1-2 business days to complete this process.  Drug prices often vary depending on where the prescription is filled and some pharmacies may offer cheaper prices.  The website www.goodrx.com contains coupons for medications through different pharmacies. The prices here do not account for what the cost may be with help from insurance (  it may be cheaper with your insurance), but the website can give you the price if you did not use any insurance.  - You can print the associated coupon and take it with your prescription to the pharmacy.  - You may also stop by our office during regular business hours and pick up a GoodRx coupon card.  - If you need your prescription sent electronically to a different pharmacy, notify our office through [facility] or by phone at [phone] option 4.     Si  Usted Necesita Algo Despus de Su Visita  Tambin puede enviarnos un mensaje a travs de Pharmacist, community. Por lo general respondemos a los mensajes de MyChart en el transcurso de 1 a 2 das hbiles.  Para renovar recetas, por favor pida a su farmacia que se ponga en contacto con nuestra oficina. Harland Dingwall de fax es [place] [phone].  Si tiene un asunto urgente cuando la clnica est cerrada y que no puede esperar hasta el siguiente da hbil, puede llamar/localizar a su doctor(a) al nmero que aparece a continuacin.   Por favor, tenga en cuenta que aunque hacemos todo lo posible para estar disponibles para asuntos urgentes fuera del horario de [place], no estamos disponibles las 24 horas del da, los 7 das de la [place].   Si tiene un problema urgente y no puede comunicarse con nosotros, puede optar por buscar atencin mdica  en el consultorio de su doctor(a), en una clnica privada, en un centro de atencin urgente o en una sala de emergencias.  Si tiene Engineering geologist, por favor llame inmediatamente al 911 o vaya a la sala de emergencias.  Nmeros de bper  - Dr. Nehemiah Massed: [phone]  - Dra. Moye: [phone]  - Dra. Nicole Kindred: [phone]  En caso de inclemencias del [place], por favor llame a Johnsie Kindred principal al [phone] para una actualizacin sobre el [place] de cualquier retraso o cierre.  Consejos para la medicacin en dermatologa: Por favor, guarde las cajas en las que vienen los medicamentos de uso tpico para ayudarle a seguir las instrucciones sobre dnde y cmo usarlos. Las farmacias generalmente imprimen las instrucciones del medicamento slo en las cajas y no directamente en los tubos del [place].   Si su medicamento es muy caro, por favor, pngase en contacto con Zigmund Daniel llamando al [phone] y presione la opcin 4 o envenos un mensaje a travs de Pharmacist, community.   No podemos decirle cul ser su copago por los medicamentos por adelantado ya que  esto es diferente dependiendo de la cobertura de su seguro. Sin embargo, es posible que podamos encontrar un medicamento sustituto a Electrical engineer un formulario para que el seguro cubra el medicamento que se considera necesario.   Si se requiere una autorizacin previa para que su compaa de seguros [place] su medicamento, por favor permtanos de 1 a 2 das hbiles para completar este proceso.  Los precios de los medicamentos varan con frecuencia dependiendo del Environmental consultant de dnde se surte la receta y alguna farmacias pueden ofrecer precios ms baratos.  El sitio web www.goodrx.com tiene cupones para medicamentos de Airline pilot. Los precios aqu no tienen en cuenta lo que podra costar con la ayuda del seguro (puede ser ms barato con su seguro), pero el sitio web puede darle el precio si no utiliz Research scientist (physical sciences).  - Puede imprimir el cupn correspondiente y llevarlo con su receta a la farmacia.  - Tambin puede pasar por nuestra oficina durante el horario de atencin  regular y recoger una tarjeta de cupones de GoodRx.  - Si necesita que su receta se enve electrnicamente a una farmacia diferente, informe a nuestra oficina a travs de MyChart de [place] o por telfono llamando al [phone] y presione la opcin 4.

## 2022-12-26 NOTE — Progress Notes (Signed)
Follow-Up Visit   Subjective  Gregory Suarez is a 77 y.o. male who presents for the following: Skin Cancer Screening and Full Body Skin Exam The patient presents for Total-Body Skin Exam (TBSE) for skin cancer screening and mole check. The patient has spots, moles and lesions to be evaluated, some may be new or changing and the patient has concerns that these could be cancer.  The following portions of the chart were reviewed this encounter and updated as appropriate: medications, allergies, medical history  Review of Systems:  No other skin or systemic complaints except as noted in HPI or Assessment and Plan.  Objective  Well appearing patient in no apparent distress; mood and affect are within normal limits.  A full examination was performed including scalp, head, eyes, ears, nose, lips, neck, chest, axillae, abdomen, back, buttocks, bilateral upper extremities, bilateral lower extremities, hands, feet, fingers, toes, fingernails, and toenails. All findings within normal limits unless otherwise noted below.   Relevant physical exam findings are noted in the Assessment and Plan.   Assessment & Plan   HISTORY OF BASAL CELL CARCINOMA OF THE SKIN - No evidence of recurrence today - Recommend regular full body skin exams - Recommend daily broad spectrum sunscreen SPF 30+ to sun-exposed areas, reapply every 2 hours as needed.  - Call if any new or changing lesions are noted between office visits  LENTIGINES, SEBORRHEIC KERATOSES, HEMANGIOMAS - Benign normal skin lesions - Benign-appearing - Call for any changes  MELANOCYTIC NEVI - Tan-brown and/or pink-flesh-colored symmetric macules and papules - Benign appearing on exam today - Observation - Call clinic for new or changing moles - Recommend daily use of broad spectrum spf 30+ sunscreen to sun-exposed areas.   ACTINIC DAMAGE - Chronic condition, secondary to cumulative UV/sun exposure - diffuse scaly erythematous macules with  underlying dyspigmentation - Recommend daily broad spectrum sunscreen SPF 30+ to sun-exposed areas, reapply every 2 hours as needed.  - Staying in the shade or wearing long sleeves, sun glasses (UVA+UVB protection) and wide brim hats (4-inch brim around the entire circumference of the hat) are also recommended for sun protection.  - Call for new or changing lesions.  SKIN CANCER SCREENING PERFORMED TODAY.  ACTINIC KERATOSIS Exam: Erythematous thin papules/macules with gritty scale  Actinic keratoses are precancerous spots that appear secondary to cumulative UV radiation exposure/sun exposure over time. They are chronic with expected duration over 1 year. A portion of actinic keratoses will progress to squamous cell carcinoma of the skin. It is not possible to reliably predict which spots will progress to skin cancer and so treatment is recommended to prevent development of skin cancer.  Recommend daily broad spectrum sunscreen SPF 30+ to sun-exposed areas, reapply every 2 hours as needed.  Recommend staying in the shade or wearing long sleeves, sun glasses (UVA+UVB protection) and wide brim hats (4-inch brim around the entire circumference of the hat). Call for new or changing lesions.  Treatment Plan:  Prior to procedure, discussed risks of blister formation, small wound, skin dyspigmentation, or rare scar following cryotherapy. Recommend Vaseline ointment to treated areas while healing.  Destruction Procedure Note Destruction method: cryotherapy   Informed consent: discussed and consent obtained   Lesion destroyed using liquid nitrogen: Yes   Outcome: patient tolerated procedure well with no complications   Post-procedure details: wound care instructions given   Locations: right cheek # of Lesions Treated: 1   INFLAMED SEBORRHEIC KERATOSIS Exam: Erythematous keratotic or waxy stuck-on papule or plaque. Symptomatic, irritating,  patient would like treated. Benign-appearing.  Call  clinic for new or changing lesions.  Prior to procedure, discussed risks of blister formation, small wound, skin dyspigmentation, or rare scar following treatment. Recommend Vaseline ointment to treated areas while healing.  Destruction Procedure Note Destruction method: cryotherapy   Informed consent: discussed and consent obtained   Lesion destroyed using liquid nitrogen: Yes   Outcome: patient tolerated procedure well with no complications   Post-procedure details: wound care instructions given   Locations: right cheek # of Lesions Treated: 1  SEBORRHEIC DERMATITIS Exam: Pink patches with greasy scale at ears  Seborrheic Dermatitis is a chronic persistent rash characterized by pinkness and scaling most commonly of the mid face but also can occur on the scalp (dandruff), ears; mid chest, mid back and groin.  It tends to be exacerbated by stress and cooler weather.  People who have neurologic disease may experience new onset or exacerbation of existing seborrheic dermatitis.  The condition is not curable but treatable and can be controlled.  Treatment Plan: Recommend 1% hydrocortisone cream qd 3 times per week  ATOPIC DERMATITIS Exam: Scaly pink papules coalescing to plaques Atopic dermatitis (eczema) is a chronic, relapsing, pruritic condition that can significantly affect quality of life. It is often associated with allergic rhinitis and/or asthma and can require treatment with topical medications, phototherapy, or in severe cases biologic injectable medication (Dupixent; Adbry) or Oral JAK inhibitors.  Treatment Plan: Mometasone cream qd up to 5 times per week Recommend gentle skin care.   Purpura - Chronic; persistent and recurrent.  Treatable, but not curable. - Violaceous macules and patches - Benign - Related to trauma, age, sun damage and/or use of blood thinners, chronic use of topical and/or oral steroids - Observe - Can use OTC arnica containing moisturizer such as  Dermend Bruise Formula if desired - Call for worsening or other concerns  Acrochordons (Skin Tags) - Fleshy, skin-colored pedunculated papules - Benign appearing.  - Observe. - If desired, they can be removed with an in office procedure that is not covered by insurance. - Please call the clinic if you notice any new or changing lesions.   Return in about 1 year (around 12/26/2023) for TBSE.  I, Ashok Cordia, CMA, am acting as scribe for Sarina Ser, MD .  Documentation: I have reviewed the above documentation for accuracy and completeness, and I agree with the above.  Sarina Ser, MD

## 2022-12-30 ENCOUNTER — Other Ambulatory Visit: Payer: Self-pay | Admitting: Cardiovascular Disease

## 2022-12-30 DIAGNOSIS — Z0000 Encounter for general adult medical examination without abnormal findings: Secondary | ICD-10-CM

## 2023-01-03 ENCOUNTER — Telehealth: Payer: Self-pay

## 2023-01-03 ENCOUNTER — Other Ambulatory Visit: Payer: Self-pay

## 2023-01-03 ENCOUNTER — Other Ambulatory Visit: Payer: Self-pay | Admitting: Family Medicine

## 2023-01-03 DIAGNOSIS — N400 Enlarged prostate without lower urinary tract symptoms: Secondary | ICD-10-CM

## 2023-01-03 DIAGNOSIS — I10 Essential (primary) hypertension: Secondary | ICD-10-CM

## 2023-01-03 DIAGNOSIS — N529 Male erectile dysfunction, unspecified: Secondary | ICD-10-CM

## 2023-01-03 MED ORDER — TADALAFIL 5 MG PO TABS: 5.0000 mg | ORAL_TABLET | Freq: Every day | ORAL | 1 refills | Status: DC

## 2023-01-03 NOTE — Telephone Encounter (Signed)
Prescription Request  01/03/2023  LOV: 12/25/22  What is the name of the medication or equipment? tadalafil (CIALIS) 5 MG tablet [id number]  Have you contacted your pharmacy to request a refill? Yes   Which pharmacy would you like this sent to?  [id number] [place], [address] AT [address] [place] [address] Phone: [phone] Fax: [phone]    Patient notified that their request is being sent to the clinical staff for review and that they should receive a response within 2 business days.   Please advise at [facility] [phone]

## 2023-01-08 ENCOUNTER — Encounter: Payer: Self-pay | Admitting: Cardiovascular Disease

## 2023-01-08 ENCOUNTER — Ambulatory Visit: Payer: Medicare Other | Attending: Cardiovascular Disease | Admitting: Cardiovascular Disease

## 2023-01-08 VITALS — BP 114/68 | HR 59 | Ht 75.0 in | Wt 243.8 lb

## 2023-01-08 DIAGNOSIS — G4733 Obstructive sleep apnea (adult) (pediatric): Secondary | ICD-10-CM | POA: Diagnosis not present

## 2023-01-08 DIAGNOSIS — G2581 Restless legs syndrome: Secondary | ICD-10-CM | POA: Insufficient documentation

## 2023-01-08 DIAGNOSIS — I2510 Atherosclerotic heart disease of native coronary artery without angina pectoris: Secondary | ICD-10-CM | POA: Insufficient documentation

## 2023-01-08 DIAGNOSIS — E785 Hyperlipidemia, unspecified: Secondary | ICD-10-CM | POA: Diagnosis not present

## 2023-01-08 DIAGNOSIS — I10 Essential (primary) hypertension: Secondary | ICD-10-CM | POA: Insufficient documentation

## 2023-01-08 DIAGNOSIS — Z01818 Encounter for other preprocedural examination: Secondary | ICD-10-CM | POA: Diagnosis not present

## 2023-01-08 DIAGNOSIS — K219 Gastro-esophageal reflux disease without esophagitis: Secondary | ICD-10-CM | POA: Diagnosis not present

## 2023-01-08 NOTE — Patient Instructions (Addendum)
Cardiac PET scanning medication Instructions:   No  changes *If you need a refill on your cardiac medications before your next appointment, please call your pharmacy*   Lab Work: Not  needed    Testing/Procedures:  [facility] - Cardiac PET Scan -- .card  Follow-Up: At [facility], you and your health needs are our priority.  As part of our continuing mission to provide you with exceptional heart care, we have created designated Provider Care Teams.  These Care Teams include your primary Cardiologist (physician) and Advanced Practice Providers (APPs -  Physician Assistants and Nurse Practitioners) who all work together to provide you with the care you need, when you need it.     Your next appointment:   6 month(s)  The format for your next appointment:   In Person  Provider:   Nicki Guadalajarahomas Keddrick Wyne, MD    Other Instructions   How to Prepare for Your Cardiac PET/CT Stress Test:  1. Please do not take these medications before your test:   Medications that may interfere with the cardiac pharmacological stress agent (ex. nitrates - including erectile dysfunction medications, isosorbide mononitrate, tamulosin or beta-blockers) the day of the exam. (Erectile dysfunction medication should be held for at least 72 hrs prior to test) Theophylline containing medications for 12 hours. Dipyridamole 48 hours prior to the test. Your remaining medications may be taken with water.  2. Nothing to eat or drink, except water, 3 hours prior to arrival time.   NO caffeine/decaffeinated products, or chocolate 12 hours prior to arrival.  3. NO perfume, cologne or lotion  4. Total time is 1 to 2 hours; you may want to bring reading material for the waiting time.  5. Please report to Radiology at the [facility] Main Entrance 30 minutes early for your test.  [address]  [place], [place] [id number]  Diabetic Preparation:  Hold oral medications. You may  take NPH and Lantus insulin. Do not take Humalog or Humulin R (Regular Insulin) the day of your test. Check blood sugars prior to leaving the house. If able to eat breakfast prior to 3 hour fasting, you may take all medications, including your insulin, Do not worry if you miss your breakfast dose of insulin - start at your next meal.  IF YOU THINK YOU MAY BE PREGNANT, OR ARE NURSING PLEASE INFORM THE TECHNOLOGIST.  In preparation for your appointment, medication and supplies will be purchased.  Appointment availability is limited, so if you need to cancel or reschedule, please call the Radiology Department at [phone]  24 hours in advance to avoid a cancellation fee of $100.00  What to Expect After you Arrive:  Once you arrive and check in for your appointment, you will be taken to a preparation room within the Radiology Department.  A technologist or Nurse will obtain your medical history, verify that you are correctly prepped for the exam, and explain the procedure.  Afterwards,  an IV will be started in your arm and electrodes will be placed on your skin for EKG monitoring during the stress portion of the exam. Then you will be escorted to the PET/CT scanner.  There, staff will get you positioned on the scanner and obtain a blood pressure and EKG.  During the exam, you will continue to be connected to the EKG and blood pressure machines.  A small, safe amount of a radioactive tracer will be injected in your IV to obtain a series of  pictures of your heart along with an injection of a stress agent.    After your Exam:  It is recommended that you eat a meal and drink a caffeinated beverage to counter act any effects of the stress agent.  Drink plenty of fluids for the remainder of the day and urinate frequently for the first couple of hours after the exam.  Your doctor will inform you of your test results within 7-10 business days.  For questions about your test or how to prepare for your test,  please call: Rockwell AlexandriaSara Wallace, Cardiac Imaging Nurse Navigator  Larey BrickMerle Prescott, Cardiac Imaging Nurse Navigator Office: [phone]

## 2023-01-08 NOTE — Progress Notes (Signed)
Primary MD: Dr. Lynnea FerrierWarren Pickard  PATIENT PROFILE: Gregory Suarez is a 77 y.o. male who presents for a 13 month follow-up evaluaiton.  HPI:  Gregory Suarez is a former patient of Dr. Scotty CourtStafford.  He underwent heart catheterization over 20 ago and was told of having mild blockage which medical therapy was recommended. He has a long history of sleep apnea and initially was followed by Dr. Marcelyn BruinsKeith Clance.  He on his third CPAP machine.  He has a istory of significant obesity when I last saw him in 2017 he had lost over 50 pounds   He denied any exertional chest pain but admits to intermittent left sharp, achy discomfort which is nonexertional.  He denied associated palpitations or shortness of breath.  He admitted to leg swelling and wore an ankle brace..  When I last saw him, he was stable from a cardiac standpoint.  An echo Doppler study in December 2017 showed an EF of 55 to 60%.  Over the past several years, he had done well with reference to chest pain or palpitations.  I  saw him in February 2020.  He had undergone a recent chest CT which showed emphysema, aortic atherosclerosis, as well as extensive coronary calcifications.  He has not been using his CPAP therapy and I obtained a download  in the office which showed only 2 days of use over the last 90 days.  He also has a history of restless leg syndrome.  He typically goes to bed between 9 and 11 PM and wakes up between 7 and 9 AM.  He had see Dr. Tanya NonesPickard and because of his abnormal CT scan was sent to me in February for follow-up evaluation.  At that time, in light of his significant coronary calcification I recommended that he undergo a coronary CT angiogram.  This was significantly abnormal and revealed a markedly elevated calcium score of 2264 with diffuse coronary calcification and stenoses in the proximal LAD, diagonal, circumflex marginal, and RCA.    As a result he was referred for cardiac catheterization which I performed on Feb 13 2019.  He was  found to have diffuse multivessel coronary calcification with 50 to 60% proximal LAD stenoses, diffuse 50% stenosis in the first diagonal branch of the LAD with mild irregularity in the mid distal LAD without high-grade stenosis.  Left circumflex vessel had smooth 60% mid distal OM stenosis in the RCA had mild irregularity with proximal to mid calcification and a dominant RCA with stenosis of 20%.  He had preserved LV contractility with an EF at 55%. Medical therapy was recommended.  He had been started on low-dose beta-blocker therapy prior to the catheterization and subsequent to the procedure I recommended initiation of amlodipine 5 mg and further titration of atorvastatin to 80 mg.  I saw him in August 2020.  At that time he continued to feel well and denied any recurrent chest pain, palpitations or shortness of breath.  His heart rate was running in the mid 50s.  He had noticed leg edema.  He has been 100% compliant with BiPAP therapy.  A download was obtained from July 6 through May 05, 2019.  This shows 100% use and he is averaging 8 hours and 56 minutes of BiPAP therapy per night.  At an 18/14 pressure, AHI is excellent at 1.6.  There is no leak.  He continues to be on atorvastatin 80 mg.  LDL cholesterol on April 22, 2019 was excellent at 6644 with  total cholesterol 102 HDL 43 triglyceride 76.    he was hospitalized in June 2021 with a collapsed lung.  He ultimately underwent talc pleurodesis during his hospitalization.  He has been seen by Azalee Course, PA and floor his lower extremity edema he was given several days of Lasix with benefit.  I saw him in November 21, 2020 at which time he was doing well.  He has arthritic issues involving his knees and feet with some discomfort with walking.  He is unaware of palpitations.  He denies recurrent anginal symptomatology.  He has continued to be on amlodipine 2.5 mg, furosemide 20 mg, metoprolol tartrate 25 mg twice a day for blood pressure.  He is on  atorvastatin 80 mg for hyperlipidemia.  Recent laboratory on November 14, 2020 showed an LDL cholesterol of 53.  He continues to use BiPAP.  I obtained a new download from January 20 through November 18, 2020 which confirms excellent compliance with average use at 9 hours and 40 minutes.  At an 18/14 pressure AHI is excellent at 1.0.    When I last saw him on December 11, 2021 he was having  arthritic issues and has had knees and shoulders injections.  He is followed by Dr. Tanya Nones who will be checking complete set of laboratory.  He is on amlodipine 2.5 mg, metoprolol tartrate 25 mg twice a day, and hydrochlorothiazide 12.5 mg as needed for blood pressure and edema.  He has a history of restless leg syndrome and takes Mirapex at bedtime.  He continues to use CPAP and a download was obtained from February 12 through December 11, 2021 evealed excellent compliance.  Average sleep was over 10 hours per night.  His BiPAP is set at a 18/14 pressure.  There is no leak.  AHI is excellent at 2.1.    Since I last saw him, he denies any chest pain or shortness of breath.  He continues to be followed by Dr. Tanya Nones.  He has experienced some mild ankle edema.  He has been having difficulty with his right shoulder and may require reverse shoulder replacement to be done by Dr. Devonne Doughty in the near future.  He has had difficulty falling asleep.  He continues to use his ResMed air care of 10 via auto BiPAP unit with set up date May 13, 2018.  A download was obtained from March 10 through January 07, 2023.  Compliance is suboptimal with average use on days used at 4 hours and 25 minutes.  His device is set on a BiPAP pressure of 18/14.  AHI is 2.4.  He continues to be on amlodipine 2.5 mg, HCTZ 12.5 mg as needed, and metoprolol tartrate 25 mg twice a day for his CAD and hypertension.  He is on carbidopa/levodopa for restless leg syndrome.  He is on atorvastatin 80 mg daily for hyperlipidemia with most recent LDL cholesterol on December 19, 2022 excellent at 47.  He presents for cardiology evaluation and preoperative clearance prior to right reverse shoulder replacement surgery.   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   right temple : sccis - shave   Tubular adenoma of colon     Past Surgical History:  Procedure Laterality Date   ANKLE SURGERY     BIOPSY  05/08/2022   Procedure: BIOPSY;  Surgeon: Dolores Frame, MD;  Location: [facility];  Service: Gastroenterology;;   CHOLECYSTECTOMY     CHOLECYSTECTOMY     COLONOSCOPY WITH PROPOFOL N/A 05/08/2022   Procedure: COLONOSCOPY WITH PROPOFOL;  Surgeon: Dolores Frame, MD;  Location: [facility];  Service: Gastroenterology;  Laterality: N/A;  730 ASA 2   ESOPHAGOGASTRODUODENOSCOPY (EGD) WITH PROPOFOL N/A 05/08/2022   Procedure: ESOPHAGOGASTRODUODENOSCOPY (EGD) WITH PROPOFOL;  Surgeon: Dolores Frame, MD;  Location: [facility];  Service: Gastroenterology;  Laterality: N/A;   LEFT HEART CATH AND CORONARY ANGIOGRAPHY N/A 02/13/2019   Procedure: LEFT HEART CATH AND CORONARY ANGIOGRAPHY;  Surgeon: Lennette Bihari, MD;  Location: [facility];  Service: Cardiovascular;  Laterality: N/A;   POLYPECTOMY  05/08/2022   Procedure: POLYPECTOMY;  Surgeon: Marguerita Merles, Reuel Boom, MD;  Location: [facility];  Service: Gastroenterology;;    Allergies  Allergen Reactions   Crestor [Rosuvastatin] Rash and Other (See Comments)     "Did not feel well"     Current Outpatient Medications  Medication Sig Dispense Refill   acetaminophen (TYLENOL) 500 MG tablet Take 500-1,000 mg by mouth every 6 (six) hours as needed (pain.).     amLODipine (NORVASC) 2.5 MG tablet TAKE 1 TABLET BY MOUTH EVERY DAY (Patient taking differently: Take 2.5 mg by mouth every evening.) 90 tablet 3   aspirin EC 81 MG tablet Take 1 tablet (81 mg total) by mouth daily. 90 tablet 3   atorvastatin (LIPITOR) 80 MG tablet TAKE 1 TABLET BY MOUTH EVERY DAY 90 tablet 0   benzonatate (TESSALON) 100 MG capsule Take 100 mg by mouth 3 (three) times daily as needed for cough.     Carbidopa-Levodopa ER (SINEMET CR) 25-100 MG tablet controlled release Take 1 tablet by mouth at bedtime.     CHLORPHENIRAMINE MALEATE PO Take 8 mg by mouth at bedtime.      Cholecalciferol (VITAMIN D-3) 125 MCG (5000 UT) TABS Take 5,000 Units by mouth in the morning.     ferrous sulfate 325 (65 FE) MG tablet Take 325 mg by mouth daily with breakfast.     finasteride (PROSCAR) 5 MG tablet TAKE 1 TABLET (5 MG TOTAL) BY MOUTH DAILY. (Patient taking differently: Take 5 mg by mouth every evening.) 90 tablet 3   hydrochlorothiazide (MICROZIDE) 12.5 MG capsule Take 12.5 mg by mouth daily as needed (fluid/swelling).     hydrocortisone 2.5 % cream Apply topically as directed. Apply to scaly rash on face hs on Tuesday, Thursday, and Saturday (Patient taking differently: Apply 1 Application topically 3 (three) times a week. Apply to scaly rash on face hs on Tuesday, Thursday, and Saturday) 30 g 6   ketoconazole (NIZORAL) 2 % cream Apply 1 application. topically daily. Qhs to feet for 1 month, Apply to scaly areas on face 3 times weekly, Monday, Wednesday and Friday (Patient taking differently: Apply 1 application  topically every Monday, Wednesday, and Friday. Qhs to feet for 1 month, Apply to scaly areas on face 3 times weekly, Monday, Wednesday and Friday) 60 g 11  meclizine (ANTIVERT) 25 MG tablet  Take 25 mg by mouth 3 (three) times daily as needed for dizziness.     Melatonin 5 MG TABS Take 10 mg by mouth at bedtime.      Menatetrenone (VITAMIN K2) 100 MCG TABS Take by mouth in the morning.     metoprolol tartrate (LOPRESSOR) 25 MG tablet TAKE 1 TABLET BY MOUTH TWICE A DAY 180 tablet 3   mometasone (ELOCON) 0.1 % cream Apply 1 Application topically daily. Apply to affected area of back up to 5 days per week prn 45 g 3   Nutritional Supplements (PRO-PEPTIDE PO) Take 1 capsule by mouth in the morning.  BPC 157 (gastric pentadecapeptide)     pantoprazole (PROTONIX) 40 MG tablet TAKE 1 TABLET BY MOUTH TWICE A DAY 180 tablet 3   pyridOXINE (VITAMIN B6) 100 MG tablet Take 100 mg by mouth in the morning.     tadalafil (CIALIS) 5 MG tablet Take 1 tablet (5 mg total) by mouth daily. 90 tablet 1   terbinafine (LAMISIL) 250 MG tablet Take 1 tablet (250 mg total) by mouth daily. 30 tablet 1   tiZANidine (ZANAFLEX) 4 MG tablet Take 1 tablet (4 mg total) by mouth every 8 (eight) hours as needed for muscle spasms. 30 tablet 1   valACYclovir (VALTREX) 1000 MG tablet Take 1,000 mg by mouth daily as needed (outbreak).     No current facility-administered medications for this visit.    Social History   Socioeconomic History   Marital status: Married    Spouse name: Not on file   Number of children: Not on file   Years of education: Not on file   Highest education level: Not on file  Occupational History   Occupation: RETIRED  Tobacco Use   Smoking status: Former    Packs/day: 0.50    Years: 15.00    Additional pack years: 0.00    Total pack years: 7.50    Types: Cigarettes    Quit date: 10/01/1988    Years since quitting: 34.3    Passive exposure: Past   Smokeless tobacco: Never  Substance and Sexual Activity   Alcohol use: No    Alcohol/week: 0.0 standard drinks of alcohol   Drug use: No   Sexual activity: Yes    Comment: married, 2 grown sons, retired maintenance man  Other Topics  Concern   Not on file  Social History Narrative   Not on file   Social Determinants of Health   Financial Resource Strain: Low Risk  (05/13/2020)   Overall Financial Resource Strain (CARDIA)    Difficulty of Paying Living Expenses: Not very hard  Food Insecurity: Not on file  Transportation Needs: Not on file  Physical Activity: Not on file  Stress: Not on file  Social Connections: Not on file  Intimate Partner Violence: Not on file   Additional social history is notable that he is married for 44 years.  He has 2 children and 4 grandchildren.  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  He remotely smoked but quit in 1990.  He is not routinely exercise.  Family History  Problem Relation Age of Onset   Diabetes Other    Breast cancer Other    Coronary artery disease Other    Family history is notable that his mother died at 8396 with cancer.  Father had heart disease and diabetes mellitus.  He has 3 brothers, one had heart valve replacement in 2017 and another  had undergone CABG revascularization surgery.  He has 2 living sisters and one sister died in an accident.  ROS General: Negative; No fevers, chills, or night sweats HEENT: Negative; No changes in vision or hearing, sinus congestion, difficulty swallowing Pulmonary: Negative; No cough, wheezing, shortness of breath, hemoptysis Cardiovascular:  See HPI; No chest pain, presyncope, syncope, palpitations, edema GI: Negative; No nausea, vomiting, diarrhea, or abdominal pain GU: Negative; No dysuria, hematuria, or difficulty voiding Musculoskeletal: Arthritic issues, knees/shoulders Hematologic/Oncologic: Negative; no easy bruising, bleeding Endocrine: Negative; no heat/cold intolerance; no diabetes Neuro: Negative; no changes in balance, headaches Skin: Negative; No rashes or skin lesions Psychiatric: Negative; No behavioral problems, depression Sleep: Positive for obstructive sleep apnea on his third BiPAP  machine.  No daytime sleepiness, hypersomnolence, bruxism, restless legs, hypnogagnic hallucinations Other comprehensive 14 point system review is negative   Physical Exam BP 114/68 (BP Location: Right Arm, Patient Position: Sitting, Cuff Size: Large)   Pulse (!) 59   Ht 6\' 3"  (1.905 m)   Wt 243 lb 12.8 oz (110.6 kg)   SpO2 97%   BMI 30.47 kg/m    Repeat blood pressure by me was 134/70  Wt Readings from Last 3 Encounters:  01/08/23 243 lb 12.8 oz (110.6 kg)  12/25/22 244 lb (110.7 kg)  05/03/22 264 lb 5.3 oz (119.9 kg)   General: Alert, oriented, no distress.  Skin: normal turgor, no rashes, warm and dry HEENT: Normocephalic, atraumatic. Pupils equal round and reactive to light; sclera anicteric; extraocular muscles intact;  Nose without nasal septal hypertrophy Mouth/Parynx benign; Mallinpatti scale 3 Neck: No JVD, no carotid bruits; normal carotid upstroke Lungs: clear to ausculatation and percussion; no wheezing or rales Chest wall: without tenderness to palpitation Heart: PMI not displaced, RRR, s1 s2 normal, 1/6 systolic murmur, no diastolic murmur, no rubs, gallops, thrills, or heaves Abdomen: soft, nontender; no hepatosplenomehaly, BS+; abdominal aorta nontender and not dilated by palpation. Back: no CVA tenderness Pulses 2+ Musculoskeletal: full range of motion, normal strength, no joint deformities Extremities: no clubbing cyanosis or edema, Homan's sign negative  Neurologic: grossly nonfocal; Cranial nerves grossly wnl Psychologic: Normal mood and affect   January 08, 2023   ECG (independently read by me): Sinus bradycardia at 59, LAD, nonspecific intraventricular block  December 11, 2021 ECG (independently read by me): Sinus bradycardia at 57, LVH, no ectopy  November 21, 2020 ECG (independently read by me): Sinus bradycardia at 54; LAHB, LVH, no ectopy  August 2020 ECG (independently read by me): Sinus bradycardia 53 bpm.  LVH by voltage criteria.  Left anterior  hemiblock.  Normal intervals.  October 2017 ECG (independently read by me): Marked sinus bradycardia at 42 bpm.  No ectopy.  LABS:     Latest Ref Rng & Units 12/19/2022    8:19 AM 12/15/2021    8:43 AM 12/08/2020    8:43 AM  BMP  Glucose 65 - 99 mg/dL 84  88  86   BUN 7 - 25 mg/dL 14  12  13    Creatinine 0.70 - 1.28 mg/dL 8.46  9.62  9.52   BUN/Creat Ratio 6 - 22 (calc) SEE NOTE:  NOT APPLICABLE  NOT APPLICABLE   Sodium 135 - 146 mmol/L 144  145  142   Potassium 3.5 - 5.3 mmol/L 4.0  4.0  4.3   Chloride 98 - 110 mmol/L 106  109  109   CO2 20 - 32 mmol/L 25  26  23    Calcium 8.6 - 10.3 mg/dL  9.2  9.0  8.5         Latest Ref Rng & Units 12/19/2022    8:19 AM 12/15/2021    8:43 AM 06/28/2021   11:57 AM  Hepatic Function  Total Protein 6.1 - 8.1 g/dL 6.4  6.2  6.5   Albumin 3.7 - 4.7 g/dL   4.2   AST 10 - 35 U/L 23  24  18    ALT 9 - 46 U/L 14  17  13    Alk Phosphatase 44 - 121 IU/L   125   Total Bilirubin 0.2 - 1.2 mg/dL 0.9  0.7  0.7   Bilirubin, Direct 0.00 - 0.40 mg/dL   1.61        Latest Ref Rng & Units 12/19/2022    8:19 AM 12/15/2021    8:43 AM 06/28/2021   11:57 AM  CBC  WBC 3.8 - 10.8 Thousand/uL 7.8  7.1  8.2   Hemoglobin 13.2 - 17.1 g/dL 09.6  04.5  40.9   Hematocrit 38.5 - 50.0 % 45.5  44.8  44.2   Platelets 140 - 400 Thousand/uL 206  185  217    Lab Results  Component Value Date   MCV 92.3 12/19/2022   MCV 90.9 12/15/2021   MCV 91 06/28/2021   Lab Results  Component Value Date   TSH 1.54 12/25/2022   No results found for: "HGBA1C"   BNP No results found for: "BNP"  ProBNP No results found for: "PROBNP"   Lipid Panel     Component Value Date/Time   CHOL 104 12/19/2022 0819   CHOL 109 11/14/2020 0849   TRIG 88 12/19/2022 0819   HDL 40 12/19/2022 0819   HDL 39 (L) 11/14/2020 0849   CHOLHDL 2.6 12/19/2022 0819   VLDL 18 02/07/2017 0809   LDLCALC 47 12/19/2022 0819    RADIOLOGY: No results found.    IMPRESSION: 1. Coronary artery  disease involving native coronary artery of native heart without angina pectoris   2. Pre-op evaluation   3. OSA treated with BiPAP   4. Essential hypertension   5. Hyperlipidemia LDL goal <70   6. RLS (restless legs syndrome)   7. Gastroesophageal reflux disease without esophagitis      ASSESSMENT AND PLAN: 1.  Coronary calcification/CAD: Calcium score 2264 with diffuse coronary calcification and stenosis in the proximal LAD, diagonal, circumflex marginal and RCA.  Definitive cardiac catheterization was performed on Feb 13, 2019 which reveale diffuse multivessel coronary calcification of 50 to 60% in the proximal LAD, diffuse 50% stenosis in the first diagonal branch of the LAD and irregularity of the mid distal LAD without significant stenosis.  The left circumflex vessel had 60% mid distal OM stenosis proximal to the bifurcation and there was mild irregularity with proximal to mid calcification and a dominant RCA.  Presently, he denies any chest pain or significant shortness of breath but due to arthritic issues is not very active.  He continues to be on low-dose amlodipine at 2.5 mg, and metoprolol tartrate 25 mg twice a day.  He takes HCTZ on a as needed basis for leg swelling.  He also has continued to be on high potency statin therapy with atorvastatin and baby aspirin with most recent LDL cholesterol at 47.  2.  Preoperative clearance.  Patient is in need to undergo right shoulder reverse shoulder replacement to be done by Dr. Devonne Doughty.  He is now over 4 years since his definitive catheterization.  I have recommended he  undergo stress PET evaluation for preoperative assessment in light of his multivessel CAD to make certain there is no high risk ischemic findings prior to his elective surgery.  3.  Essential hypertension.  His blood pressure today on presentation was 114/68 and on repeat by me 134/70.  He continues to be on low-dose therapy with amlodipine 2.5 mg and metoprolol to tartrate 25 mg  twice a day.  He takes HCTZ 12.5 mg on an as-needed basis.  I discussed target blood pressure less than 130/80.  4.  OSA on BiPAP: Set up date May 13, 2018 with the ResMed AirCurve 10 VAuto unit.  He continues to use therapy.  Previous evaluation has shown excellent compliance.  Most recent assessment from March 10 through January 07, 2023 shows only 4 hours and 25 minutes of use per night.  He has had difficulty falling asleep.  He also has restless legs.  I again discussed optimal sleep duration at 7 and 9 hours and that he continue to use CPAP for the nights duration percent currently since sleep apnea is worse in REM sleep and apartments of REM sleep occurs in the second half of the night. He was inquiring about potentially obtaining a new unit.  I discussed with him that he will need to complete 5 years with his current machine unless the machine has issues.  It will be 5 years after August 2024.  I will see him in October for follow-up evaluation.  5. Hyperlipidemia: On atorvastatin 80 mg. Laboratory from Feb 11, 2021 total cholesterol 109, LDL 53 triglycerides 83 HDL 39.  Most recent laboratory from December 19, 2022 showed total cholesterol 104, HDL 40, triglycerides 88, and LDL cholesterol at 47.  6. GERD: He continues to be on pantoprazole.  7.  Posttraumatic stress disorder, diagnosed at the [place]. He continues to be followed at the [place]  He is stable cardiovascularly.  Definitive clearance for his shoulder surgery will be completed following his cardiac PET scan.I will see him in 6 months for follow-up evaluation.  Lennette Bihari, MD, [facility] 01/13/2023 10:19 AM

## 2023-01-13 ENCOUNTER — Encounter: Payer: Self-pay | Admitting: Cardiovascular Disease

## 2023-01-20 ENCOUNTER — Other Ambulatory Visit: Payer: Self-pay | Admitting: Cardiovascular Disease

## 2023-01-21 NOTE — Telephone Encounter (Signed)
Rx request sent to pharmacy.  

## 2023-01-23 DIAGNOSIS — E538 Deficiency of other specified B group vitamins: Secondary | ICD-10-CM | POA: Diagnosis not present

## 2023-01-23 DIAGNOSIS — G4752 REM sleep behavior disorder: Secondary | ICD-10-CM | POA: Diagnosis not present

## 2023-01-23 DIAGNOSIS — R2689 Other abnormalities of gait and mobility: Secondary | ICD-10-CM | POA: Diagnosis not present

## 2023-01-23 DIAGNOSIS — G629 Polyneuropathy, unspecified: Secondary | ICD-10-CM | POA: Diagnosis not present

## 2023-01-23 DIAGNOSIS — R5383 Other fatigue: Secondary | ICD-10-CM | POA: Diagnosis not present

## 2023-01-23 DIAGNOSIS — G2581 Restless legs syndrome: Secondary | ICD-10-CM | POA: Diagnosis not present

## 2023-01-23 DIAGNOSIS — G4733 Obstructive sleep apnea (adult) (pediatric): Secondary | ICD-10-CM | POA: Diagnosis not present

## 2023-01-29 ENCOUNTER — Ambulatory Visit: Payer: No Typology Code available for payment source | Admitting: Cardiovascular Disease

## 2023-02-12 ENCOUNTER — Encounter: Payer: Self-pay | Admitting: Podiatry

## 2023-02-12 ENCOUNTER — Ambulatory Visit (INDEPENDENT_AMBULATORY_CARE_PROVIDER_SITE_OTHER): Payer: Medicare Other | Admitting: Podiatry

## 2023-02-12 DIAGNOSIS — L608 Other nail disorders: Secondary | ICD-10-CM

## 2023-02-12 DIAGNOSIS — B351 Tinea unguium: Secondary | ICD-10-CM

## 2023-02-12 DIAGNOSIS — M79674 Pain in right toe(s): Secondary | ICD-10-CM | POA: Diagnosis not present

## 2023-02-12 DIAGNOSIS — M79675 Pain in left toe(s): Secondary | ICD-10-CM

## 2023-02-12 NOTE — Progress Notes (Signed)
This patient presents to the office with chief complaint of long thick painful nails.  Patient says the nails are painful walking and wearing shoes.  This patient is unable to self treat.  This patient is unable to trim his  nails since she is unable to reach his nails.  She presents to the office for preventative foot care services.  General Appearance  Alert, conversant and in no acute stress.  Vascular  Dorsalis pedis and posterior tibial  pulses are absent   bilaterally.  Capillary return is within normal limits  bilaterally. Temperature is within normal limits  bilaterally.  Neurologic  Senn-Weinstein monofilament wire test within normal limits  bilaterally. Muscle power within normal limits bilaterally.  Nails Thick disfigured discolored nails with subungual debris hallux nails . No evidence of bacterial infection or drainage bilaterally.  Orthopedic  No limitations of motion  feet .  No crepitus or effusions noted.  No bony pathology or digital deformities noted. Mild HAV  B/L.  Hammer toes with deviated toe 3,4  B/L.  Prominent metsatarsal heads.  Cavus foot type  B/L.  Skin  normotropic skin with no porokeratosis noted bilaterally.  No signs of infections or ulcers noted.     Onychomycosis  Nails  B/L.  Pain in right toes  Pain in left toes  Debridement of nails both feet followed trimming the nails with dremel tool.    RTC 3 months.   Larisa Lanius DPM   

## 2023-02-15 ENCOUNTER — Ambulatory Visit: Payer: Medicare Other | Admitting: Podiatry

## 2023-02-17 ENCOUNTER — Other Ambulatory Visit: Payer: Self-pay | Admitting: Family Medicine

## 2023-02-19 NOTE — Telephone Encounter (Signed)
Requested Prescriptions  Pending Prescriptions Disp Refills   finasteride (PROSCAR) 5 MG tablet [Pharmacy Med Name: FINASTERIDE 5 MG TABLET] 90 tablet 3    Sig: TAKE 1 TABLET (5 MG TOTAL) BY MOUTH DAILY.     Urology: 5-alpha Reductase Inhibitors Failed - 02/17/2023  8:48 AM      Failed - Valid encounter within last 12 months    Recent Outpatient Visits           1 year ago General medical exam   [facility] Donita Brooks, MD   2 years ago Panlobular emphysema [facility])   [facility] Tanya Nones, Priscille Heidelberg, MD   2 years ago Skin lesion of face   [facility] Donita Brooks, MD   2 years ago Other pneumothorax   [facility] Tanya Nones, Priscille Heidelberg, MD   2 years ago Shortness of breath   [facility] Elmore Guise, FNP       Future Appointments             In 10 months Deirdre Evener, MD [facility]            Passed - PSA in normal range and within 360 days    PSA  Date Value Ref Range Status  12/19/2022 0.50 < OR = 4.00 ng/mL Final    Comment:    The total PSA value from this assay system is  standardized against the WHO standard. The test  result will be approximately 20% lower when compared  to the equimolar-standardized total PSA (Beckman  Coulter). Comparison of serial PSA results should be  interpreted with this fact in mind. . This test was performed using the Siemens  chemiluminescent method. Values obtained from  different assay methods cannot be used interchangeably. PSA levels, regardless of value, should not be interpreted as absolute evidence of the presence or absence of disease.

## 2023-02-20 ENCOUNTER — Other Ambulatory Visit: Payer: Self-pay | Admitting: Cardiovascular Disease

## 2023-02-20 ENCOUNTER — Other Ambulatory Visit: Payer: Self-pay | Admitting: Family Medicine

## 2023-02-20 DIAGNOSIS — Z0000 Encounter for general adult medical examination without abnormal findings: Secondary | ICD-10-CM

## 2023-02-20 NOTE — Telephone Encounter (Signed)
Requested Prescriptions  Pending Prescriptions Disp Refills   pantoprazole (PROTONIX) 40 MG tablet [Pharmacy Med Name: PANTOPRAZOLE SOD DR 40 MG TAB] 180 tablet 2    Sig: TAKE 1 TABLET BY MOUTH TWICE A DAY     Gastroenterology: Proton Pump Inhibitors Failed - 02/20/2023  2:29 AM      Failed - Valid encounter within last 12 months    Recent Outpatient Visits           1 year ago General medical exam   [facility] Donita Brooks, MD   2 years ago Panlobular emphysema [facility])   [facility] Donita Brooks, MD   2 years ago Skin lesion of face   [facility] Donita Brooks, MD   2 years ago Other pneumothorax   [facility] Tanya Nones, Priscille Heidelberg, MD   2 years ago Shortness of breath   [facility] Elmore Guise, FNP       Future Appointments             In 10 months Deirdre Evener, MD [facility]

## 2023-02-21 ENCOUNTER — Ambulatory Visit: Payer: Medicare Other | Admitting: Podiatry

## 2023-02-26 ENCOUNTER — Telehealth: Payer: Self-pay

## 2023-02-26 NOTE — Telephone Encounter (Addendum)
   Name:  Gregory Suarez  DOB:  [birth year]  MRN:  [id number]   Primary Cardiologist: Nicki Guadalajara, MD  Chart reviewed as part of pre-operative protocol coverage.   Per Dr. Tresa Endo' recent office note patient is pending ischemic evaluation prior to clearance being granted.  Cardiac PET stress test ordered and not yet scheduled  Pre-op covering staff: - Please schedule appointment and call patient to inform them. If patient already had an upcoming appointment within acceptable timeframe, please add "pre-op clearance" to the appointment notes so provider is aware. - Please contact requesting surgeon's office via preferred method (i.e, phone, fax) to inform them of need for appointment prior to surgery.  Napoleon Form, Leodis Rains, NP 02/26/2023, 11:40 AM

## 2023-02-26 NOTE — Telephone Encounter (Signed)
Surgeons office sent duplicate request in reference to clearance In process. Pt saw Dr. Tresa Endo on 01/08/23 however Dr. Tresa Endo has ordered a stress test before clearance can be granted.

## 2023-02-27 DIAGNOSIS — M75102 Unspecified rotator cuff tear or rupture of left shoulder, not specified as traumatic: Secondary | ICD-10-CM | POA: Diagnosis not present

## 2023-03-07 ENCOUNTER — Telehealth: Payer: Self-pay

## 2023-03-07 NOTE — Telephone Encounter (Signed)
  Chart reviewed as part of pre-operative protocol coverage.    Per Dr. Tresa Endo' recent office note patient is pending ischemic evaluation prior to clearance being granted.   Cardiac PET stress test ordered and not yet scheduled  This is a duplicate request.  Sharlene Dory, PA-C

## 2023-03-07 NOTE — Telephone Encounter (Signed)
   Pre-operative Risk Assessment    Patient Name: Gregory Suarez  DOB: 1946-06-14 MRN: [id number]      Request for Surgical Clearance    Procedure:   Right Reverse TSA  Date of Surgery:  Clearance TBD                                 Surgeon:  Dr. Malon Kindle Surgeon's Group or Practice Name:  [facility] Phone number:  [phone] Fax number:  [phone]   Type of Clearance Requested:   - Medical  - Pharmacy:  Hold Aspirin pt will need instructions on when/if to hold   Type of Anesthesia:  General    Additional requests/questions:    SignedZada Finders   03/07/2023, 10:29 AM

## 2023-04-03 ENCOUNTER — Encounter (HOSPITAL_COMMUNITY): Payer: Self-pay

## 2023-04-05 ENCOUNTER — Telehealth (HOSPITAL_COMMUNITY): Payer: Self-pay | Admitting: *Deleted

## 2023-04-05 NOTE — Telephone Encounter (Signed)
Reaching out to patient to offer assistance regarding upcoming cardiac imaging study; pt verbalizes understanding of appt date/time, parking situation and where to check in, pre-test NPO status and verified current allergies; name and call back number provided for further questions should they arise  Larey Brick RN Navigator Cardiac Imaging [facility] [phone] office [phone] cell  Patient aware to avoid cialis 72 hours prior and caffeine 12 hours prior to his cardiac PET scan.

## 2023-04-09 ENCOUNTER — Encounter (HOSPITAL_COMMUNITY)
Admission: RE | Admit: 2023-04-09 | Discharge: 2023-04-09 | Disposition: A | Discharge status: Home / Self Care | Payer: Medicare Other | Source: Ambulatory Visit | Attending: Cardiovascular Disease | Admitting: Cardiovascular Disease

## 2023-04-09 DIAGNOSIS — J432 Centrilobular emphysema: Secondary | ICD-10-CM | POA: Diagnosis not present

## 2023-04-09 DIAGNOSIS — I2510 Atherosclerotic heart disease of native coronary artery without angina pectoris: Secondary | ICD-10-CM | POA: Insufficient documentation

## 2023-04-09 DIAGNOSIS — Z01818 Encounter for other preprocedural examination: Secondary | ICD-10-CM | POA: Insufficient documentation

## 2023-04-09 DIAGNOSIS — I700 Atherosclerosis of aorta: Secondary | ICD-10-CM | POA: Diagnosis not present

## 2023-04-09 MED ORDER — RUBIDIUM RB82 GENERATOR (RUBYFILL)
25.0000 | PACK | Freq: Once | INTRAVENOUS | Status: AC
  Administered 2023-04-09: 28.5 via INTRAVENOUS

## 2023-04-09 MED ORDER — RUBIDIUM RB82 GENERATOR (RUBYFILL)
25.0000 | PACK | Freq: Once | INTRAVENOUS | Status: AC
  Administered 2023-04-09: 28.53 via INTRAVENOUS

## 2023-04-09 MED ORDER — REGADENOSON 0.4 MG/5ML IV SOLN
INTRAVENOUS | Status: AC
  Filled 2023-04-09: qty 5

## 2023-04-09 MED ORDER — REGADENOSON 0.4 MG/5ML IV SOLN: 0.4000 mg | Freq: Once | INTRAVENOUS | Status: DC

## 2023-04-09 NOTE — Progress Notes (Signed)
Tolerated test well.  VS WNL

## 2023-04-10 LAB — NM PET CT CARDIAC PERFUSION MULTI W/ABSOLUTE BLOODFLOW
LV dias vol: 152 mL (ref 62–150)
LV sys vol: 58 mL
MBFR: 2.72
Nuc Rest EF: 57 %
Nuc Stress EF: 62 %
Peak HR: 81 {beats}/min
Rest HR: 62 {beats}/min
Rest MBF: 0.65 ml/g/min
Rest Nuclear Isotope Dose: 28.5 mCi
ST Depression (mm): 0 mm
Stress MBF: 1.77 ml/g/min
Stress Nuclear Isotope Dose: 28.5 mCi
TID: 1.03

## 2023-04-16 NOTE — Telephone Encounter (Addendum)
     Primary Cardiologist: Nicki Guadalajara, MD  Chart reviewed as part of pre-operative protocol coverage. Given past medical history and time since last visit, based on ACC/AHA guidelines, Babatunde Seago would be at acceptable risk for the planned procedure without further cardiovascular testing.   His RCRI is a class II risk, 0.9% risk of major cardiac event.  Cardiac PET/CT 04/09/2023 showed low risk and no evidence of ischemia.  If necessary, his aspirin will be held for 5-7 days prior to his surgery.  Please resume as soon as hemostasis is achieved.  I will route this recommendation to the requesting party via Epic fax function and remove from pre-op pool.  Please call with questions.  Thomasene Ripple. Samanthamarie Ezzell NP-C     04/16/2023, 8:49 AM [facility] [address] Office [phone] Fax [phone]

## 2023-05-16 NOTE — H&P (Signed)
Patient's anticipated LOS is less than 2 midnights, meeting these requirements: - Younger than 45 - Lives within 1 hour of care - Has a competent adult at home to recover with post-op recover - NO history of  - Chronic pain requiring opiods  - Diabetes  - Coronary Artery Disease  - Heart failure  - Heart attack  - Stroke  - DVT/VTE  - Cardiac arrhythmia  - Respiratory Failure/COPD  - Renal failure  - Anemia  - Advanced Liver disease     Gregory Suarez is an 77 y.o. male.    Chief Complaint: right shoulder pain  HPI: Pt is a 77 y.o. male complaining of right shoulder pain for multiple years. Pain had continually increased since the beginning. X-rays in the clinic show end-stage arthritic changes of the right shoulder. Pt has tried various conservative treatments which have failed to alleviate their symptoms, including injections and therapy. Various options are discussed with the patient. Risks, benefits and expectations were discussed with the patient. Patient understand the risks, benefits and expectations and wishes to proceed with surgery.   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   right temple : sccis - shave   Tubular adenoma of colon     PSH: Past Surgical History:  Procedure Laterality Date   ANKLE SURGERY     BIOPSY  05/08/2022   Procedure: BIOPSY;  Surgeon: Dolores Frame, MD;   [facility]: [facility];  Service: Gastroenterology;;   CHOLECYSTECTOMY     CHOLECYSTECTOMY     COLONOSCOPY WITH PROPOFOL N/A 05/08/2022   Procedure: COLONOSCOPY WITH PROPOFOL;  Surgeon: Dolores Frame, MD;  Location: [facility];  Service: Gastroenterology;  Laterality: N/A;  730 ASA 2   ESOPHAGOGASTRODUODENOSCOPY (EGD) WITH PROPOFOL N/A 05/08/2022   Procedure: ESOPHAGOGASTRODUODENOSCOPY (EGD) WITH PROPOFOL;  Surgeon: Dolores Frame, MD;  Location: [facility];  Service: Gastroenterology;  Laterality: N/A;   LEFT HEART CATH AND CORONARY ANGIOGRAPHY N/A 02/13/2019   Procedure: LEFT HEART CATH AND CORONARY ANGIOGRAPHY;  Surgeon: Lennette Bihari, MD;  Location: [facility];  Service: Cardiovascular;  Laterality: N/A;   POLYPECTOMY  05/08/2022   Procedure: POLYPECTOMY;  Surgeon: Dolores Frame, MD;  Location: [facility];  Service: Gastroenterology;;    Social History:  reports that he quit smoking about 34 years ago. His smoking use included cigarettes. He started smoking about 49 years ago. He has a 7.5 pack-year smoking history. He has been exposed to tobacco smoke. He has never used smokeless tobacco. He reports that he does not drink alcohol and does not use drugs. BMI: Estimated body mass index is 30.47 kg/m as calculated from the following:   Height as of 01/08/23: 6\' 3"  (1.905 m).   Weight as of 01/08/23: 110.6 kg.  Lab Results  Component Value Date   ALBUMIN 4.2 06/28/2021   Diabetes: Patient does not have a diagnosis of diabetes.     Smoking Status:      Allergies:  Allergies  Allergen Reactions   Crestor [Rosuvastatin] Rash and Other (See Comments)    "Did not feel well"     Medications: No current facility-administered medications for this encounter.   Current Outpatient Medications  Medication Sig Dispense Refill   acetaminophen (TYLENOL) 500 MG tablet Take 500-1,000 mg by mouth every 6 (six) hours as needed (pain.).      amLODipine (NORVASC) 2.5 MG tablet Take 1 tablet (2.5 mg total) by mouth every evening. 90 tablet 3   aspirin EC 81 MG tablet Take 1 tablet (81 mg total) by mouth daily. 90 tablet 3   atorvastatin (LIPITOR) 80 MG tablet TAKE 1 TABLET BY MOUTH EVERY DAY 90 tablet 0   benzonatate (TESSALON) 100 MG capsule Take 100 mg by mouth 3 (three) times daily as needed for cough.     Carbidopa-Levodopa ER (SINEMET CR) 25-100 MG tablet controlled release Take 1 tablet by mouth at bedtime.     CHLORPHENIRAMINE MALEATE PO Take 8 mg by mouth at bedtime.      Cholecalciferol (VITAMIN D-3) 125 MCG (5000 UT) TABS Take 5,000 Units by mouth in the morning.     ferrous sulfate 325 (65 FE) MG tablet Take 325 mg by mouth daily with breakfast.     finasteride (PROSCAR) 5 MG tablet TAKE 1 TABLET (5 MG TOTAL) BY MOUTH DAILY. 90 tablet 3   hydrochlorothiazide (MICROZIDE) 12.5 MG capsule Take 12.5 mg by mouth daily as needed (fluid/swelling).     hydrocortisone 2.5 % cream Apply topically as directed. Apply to scaly rash on face hs on Tuesday, Thursday, and Saturday (Patient taking differently: Apply 1 Application topically 3 (three) times a week. Apply to scaly rash on face hs on Tuesday, Thursday, and Saturday) 30 g 6   ketoconazole (NIZORAL) 2 % cream Apply 1 application. topically daily. Qhs to feet for 1 month, Apply to scaly areas on face 3 times weekly, Monday, Wednesday and Friday (Patient taking differently: Apply 1 application  topically every Monday, Wednesday, and Friday. Qhs to feet for 1 month, Apply to scaly areas on face 3 times weekly, Monday, Wednesday and Friday) 60 g 11   meclizine (ANTIVERT) 25 MG tablet Take 25 mg by mouth 3 (three) times daily as needed for dizziness.     Melatonin 5 MG TABS Take 10 mg by mouth at bedtime.      Menatetrenone (VITAMIN K2) 100 MCG TABS Take by mouth in the morning.     metoprolol tartrate (LOPRESSOR) 25 MG tablet TAKE 1 TABLET BY MOUTH TWICE A DAY 180 tablet 3   mometasone  (ELOCON) 0.1 % cream Apply 1 Application topically daily. Apply to affected area of back up to 5 days per week prn 45 g 3   Nutritional Supplements (PRO-PEPTIDE PO) Take 1 capsule by mouth in the morning.  BPC 157 (gastric pentadecapeptide)     pantoprazole (PROTONIX) 40 MG tablet TAKE 1 TABLET BY MOUTH TWICE A DAY 180 tablet 2   pyridOXINE (VITAMIN B6) 100 MG tablet Take 100 mg by mouth in the morning.     tadalafil (CIALIS) 5 MG tablet Take 1 tablet (5 mg total) by mouth daily. 90 tablet 1   terbinafine (LAMISIL) 250 MG tablet Take 1 tablet (250 mg total) by mouth daily. 30 tablet 1   tiZANidine (  ZANAFLEX) 4 MG tablet Take 1 tablet (4 mg total) by mouth every 8 (eight) hours as needed for muscle spasms. 30 tablet 1   valACYclovir (VALTREX) 1000 MG tablet Take 1,000 mg by mouth daily as needed (outbreak).      No results found for this or any previous visit (from the past 48 hour(s)). No results found.  ROS: Pain with rom of the right upper extremity  Physical Exam: Alert and oriented 77 y.o. male in no acute distress Cranial nerves 2-12 intact Cervical spine: full rom with no tenderness, nv intact distally Chest: active breath sounds bilaterally, no wheeze rhonchi or rales Heart: regular rate and rhythm, no murmur Abd: non tender non distended with active bowel sounds Hip is stable with rom  Right shoulder painful and weak rom Nv intact distally No rashes or edema distally  Assessment/Plan Assessment: right shoulder cuff arthropathy  Plan:  Patient will undergo a right reverse total shoulder by Dr. Ranell Patrick at [place] Risks benefits and expectations were discussed with the patient. Patient understand risks, benefits and expectations and wishes to proceed. Preoperative templating of the joint replacement has been completed, documented, and submitted to the Operating Room personnel in order to optimize intra-operative equipment management.   Alphonsa Overall PA-C, [facility]  [facility]ics is now [employer] [address], [place], [place] [address] Phone: [phone] [email or web address] Facebook  [employer]

## 2023-05-24 NOTE — Progress Notes (Addendum)
Anesthesia Review:  GEX:[id number] clearance on chart dated 12/10/22 Cardiologist : Darlyn Read LOV 01/08/23  Neuro- DR Sherryll Burger- LOV 01/23/23 - clearance 12/05/22 on chart  Chest x-ray : EKG : 01/08/2023  Echo : 2017  Stress test: Cardiac Cath :  2020  CT cors- 2020  Activity level:  Sleep Study/ CPAP : Fasting Blood Sugar :      / Checks Blood Sugar -- times a day:   Blood Thinner/ Instructions /Last Dose: ASA / Instructions/ Last Dose :    I1 mg aspirin    HEart rate at preop was 45-50.  PT states at home he runs 40-60 and he has alarm on his watch for his heart rate.  PT states he feels fine at preop.  EKG- 01/08/23 heart rate-59.  AT preop heart rate was 47 on EKG.  PT takes Lopressor bid.  PT to take am of surgery.  Terance Hart [facility] aware.

## 2023-05-27 NOTE — Patient Instructions (Signed)
SURGICAL WAITING ROOM VISITATION  Patients having surgery or a procedure may have no more than 2 support people in the waiting area - these visitors may rotate.    Children under the age of 55 must have an adult with them who is not the patient.  Due to an increase in RSV and influenza rates and associated hospitalizations, children ages 6 and under may not visit patients in [facility] hospitals.  If the patient needs to stay at the hospital during part of their recovery, the visitor guidelines for inpatient rooms apply. Pre-op nurse will coordinate an appropriate time for 1 support person to accompany patient in pre-op.  This support person may not rotate.    Please refer to the [facility] website for the visitor guidelines for Inpatients (after your surgery is over and you are in a regular room).       Your procedure is scheduled on:  06/07/2023    Report to [facility] Main Entrance    Report to admitting at  0515 AM   Call this number if you have problems the morning of surgery [phone]   Do not eat food :After Midnight.   After Midnight you may have the following liquids until __ 0430____ AM DAY OF SURGERY  Water Non-Citrus Juices (without pulp, NO RED-Apple, White grape, White cranberry) Black Coffee (NO MILK/CREAM OR CREAMERS, sugar ok)  Clear Tea (NO MILK/CREAM OR CREAMERS, sugar ok) regular and decaf                             Plain Jell-O (NO RED)                                           Fruit ices (not with fruit pulp, NO RED)                                     Popsicles (NO RED)                                                               Sports drinks like Gatorade (NO RED)             The day of surgery:  Drink ONE (1) Pre-Surgery Clear Ensure or G2 at  0430 AM ( have completed by )  the morning of surgery. Drink in one sitting. Do not sip.  This drink was given to you during your hospital  pre-op appointment visit. Nothing else to drink after  completing the  Pre-Surgery Clear Ensure or G2.          If you have questions, please contact your surgeon's office.       Oral Hygiene is also important to reduce your risk of infection.                                    Remember - BRUSH YOUR TEETH THE MORNING OF SURGERY WITH YOUR REGULAR TOOTHPASTE  DENTURES WILL BE  REMOVED PRIOR TO SURGERY PLEASE DO NOT APPLY "Poly grip" OR ADHESIVES!!!   Do NOT smoke after Midnight   Stop all vitamins and herbal supplements 7 days before surgery.   Take these medicines the morning of surgery with A SIP OF WATER:  proscar, metoprolol, protonix   DO NOT TAKE ANY ORAL DIABETIC MEDICATIONS DAY OF YOUR SURGERY  Bring CPAP mask and tubing day of surgery.                              You may not have any metal on your body including hair pins, jewelry, and body piercing             Do not wear make-up, lotions, powders, perfumes/cologne, or deodorant  Do not wear nail polish including gel and S&S, artificial/acrylic nails, or any other type of covering on natural nails including finger and toenails. If you have artificial nails, gel coating, etc. that needs to be removed by a nail salon please have this removed prior to surgery or surgery may need to be canceled/ delayed if the surgeon/ anesthesia feels like they are unable to be safely monitored.   Do not shave  48 hours prior to surgery.               Men may shave face and neck.   Do not bring valuables to the hospital. [facility] IS NOT             RESPONSIBLE   FOR VALUABLES.   Contacts, glasses, dentures or bridgework may not be worn into surgery.   Bring small overnight bag day of surgery.   DO NOT BRING YOUR HOME MEDICATIONS TO THE HOSPITAL. PHARMACY WILL DISPENSE MEDICATIONS LISTED ON YOUR MEDICATION LIST TO YOU DURING YOUR ADMISSION IN THE HOSPITAL!    Patients discharged on the day of surgery will not be allowed to drive home.  Someone NEEDS to stay with you for the first 24  hours after anesthesia.   Special Instructions: Bring a copy of your healthcare power of attorney and living will documents the day of surgery if you haven't scanned them before.              Please read over the following fact sheets you were given: IF YOU HAVE QUESTIONS ABOUT YOUR PRE-OP INSTRUCTIONS PLEASE CALL [phone]   If you received a COVID test during your pre-op visit  it is requested that you wear a mask when out in public, stay away from anyone that may not be feeling well and notify your surgeon if you develop symptoms. If you test positive for Covid or have been in contact with anyone that has tested positive in the last 10 days please notify you surgeon.      Pre-operative 5 CHG Bath Instructions   You can play a key role in reducing the risk of infection after surgery. Your skin needs to be as free of germs as possible. You can reduce the number of germs on your skin by washing with CHG (chlorhexidine gluconate) soap before surgery. CHG is an antiseptic soap that kills germs and continues to kill germs even after washing.   DO NOT use if you have an allergy to chlorhexidine/CHG or antibacterial soaps. If your skin becomes reddened or irritated, stop using the CHG and notify one of our RNs at [phone].   Please shower with the CHG soap starting 4 days before surgery using  the following schedule:     Please keep in mind the following:  DO NOT shave, including legs and underarms, starting the day of your first shower.   You may shave your face at any point before/day of surgery.  Place clean sheets on your bed the day you start using CHG soap. Use a clean washcloth (not used since being washed) for each shower. DO NOT sleep with pets once you start using the CHG.   CHG Shower Instructions:  If you choose to wash your hair and private area, wash first with your normal shampoo/soap.  After you use shampoo/soap, rinse your hair and body thoroughly to remove  shampoo/soap residue.  Turn the water OFF and apply about 3 tablespoons (45 ml) of CHG soap to a CLEAN washcloth.  Apply CHG soap ONLY FROM YOUR NECK DOWN TO YOUR TOES (washing for 3-5 minutes)  DO NOT use CHG soap on face, private areas, open wounds, or sores.  Pay special attention to the area where your surgery is being performed.  If you are having back surgery, having someone wash your back for you may be helpful. Wait 2 minutes after CHG soap is applied, then you may rinse off the CHG soap.  Pat dry with a clean towel  Put on clean clothes/pajamas   If you choose to wear lotion, please use ONLY the CHG-compatible lotions on the back of this paper.     Additional instructions for the day of surgery: DO NOT APPLY any lotions, deodorants, cologne, or perfumes.   Put on clean/comfortable clothes.  Brush your teeth.  Ask your nurse before applying any prescription medications to the skin.      CHG Compatible Lotions   Aveeno Moisturizing lotion  Cetaphil Moisturizing Cream  Cetaphil Moisturizing Lotion  Clairol Herbal Essence Moisturizing Lotion, Dry Skin  Clairol Herbal Essence Moisturizing Lotion, Extra Dry Skin  Clairol Herbal Essence Moisturizing Lotion, Normal Skin  Curel Age Defying Therapeutic Moisturizing Lotion with Alpha Hydroxy  Curel Extreme Care Body Lotion  Curel Soothing Hands Moisturizing Hand Lotion  Curel Therapeutic Moisturizing Cream, Fragrance-Free  Curel Therapeutic Moisturizing Lotion, Fragrance-Free  Curel Therapeutic Moisturizing Lotion, Original Formula  Eucerin Daily Replenishing Lotion  Eucerin Dry Skin Therapy Plus Alpha Hydroxy Crme  Eucerin Dry Skin Therapy Plus Alpha Hydroxy Lotion  Eucerin Original Crme  Eucerin Original Lotion  Eucerin Plus Crme Eucerin Plus Lotion  Eucerin TriLipid Replenishing Lotion  Keri Anti-Bacterial Hand Lotion  Keri Deep Conditioning Original Lotion Dry Skin Formula Softly Scented  Keri Deep Conditioning  Original Lotion, Fragrance Free Sensitive Skin Formula  Keri Lotion Fast Absorbing Fragrance Free Sensitive Skin Formula  Keri Lotion Fast Absorbing Softly Scented Dry Skin Formula  Keri Original Lotion  Keri Skin Renewal Lotion Keri Silky Smooth Lotion  Keri Silky Smooth Sensitive Skin Lotion  Nivea Body Creamy Conditioning Oil  Nivea Body Extra Enriched Lotion  Nivea Body Original Lotion  Nivea Body Sheer Moisturizing Lotion Nivea Crme  Nivea Skin Firming Lotion  NutraDerm 30 Skin Lotion  NutraDerm Skin Lotion  NutraDerm Therapeutic Skin Cream  NutraDerm Therapeutic Skin Lotion  ProShield Protective Hand Cream  Provon moisturizing lotion   [place]- Preparing for Total Shoulder Arthroplasty    Before surgery, you can play an important role. Because skin is not sterile, your skin needs to be as free of germs as possible. You can reduce the number of germs on your skin by using the following products. Benzoyl Peroxide Gel Reduces the number of  germs present on the skin Applied twice a day to shoulder area starting two days before surgery    ==================================================================  Please follow these instructions carefully:  BENZOYL PEROXIDE 5% GEL  Please do not use if you have an allergy to benzoyl peroxide.   If your skin becomes reddened/irritated stop using the benzoyl peroxide.  Starting two days before surgery, apply as follows: Apply benzoyl peroxide in the morning and at night. Apply after taking a shower. If you are not taking a shower clean entire shoulder front, back, and side along with the armpit with a clean wet washcloth.  Place a quarter-sized dollop on your shoulder and rub in thoroughly, making sure to cover the front, back, and side of your shoulder, along with the armpit.   2 days before ____ AM   ____ PM              1 day before ____ AM   ____ PM                         Do this twice a day for two days.  (Last application  is the night before surgery, AFTER using the CHG soap as described below).  Do NOT apply benzoyl peroxide gel on the day of surgery.

## 2023-05-28 ENCOUNTER — Other Ambulatory Visit: Payer: Self-pay

## 2023-05-28 ENCOUNTER — Encounter (HOSPITAL_COMMUNITY)
Admission: RE | Admit: 2023-05-28 | Discharge: 2023-05-28 | Disposition: A | Discharge status: Home / Self Care | Payer: Medicare Other | Source: Ambulatory Visit | Attending: Orthopedic Surgery | Admitting: Orthopedic Surgery

## 2023-05-28 ENCOUNTER — Encounter (HOSPITAL_COMMUNITY): Payer: Self-pay

## 2023-05-28 ENCOUNTER — Encounter (HOSPITAL_COMMUNITY): Payer: Self-pay | Admitting: Orthopedic Surgery

## 2023-05-28 VITALS — BP 148/57 | HR 50 | Temp 98.4°F | Resp 16 | Ht 75.0 in | Wt 226.0 lb

## 2023-05-28 DIAGNOSIS — R9431 Abnormal electrocardiogram [ECG] [EKG]: Secondary | ICD-10-CM | POA: Diagnosis not present

## 2023-05-28 DIAGNOSIS — Z01818 Encounter for other preprocedural examination: Secondary | ICD-10-CM | POA: Diagnosis present

## 2023-05-28 DIAGNOSIS — Z01812 Encounter for preprocedural laboratory examination: Secondary | ICD-10-CM | POA: Insufficient documentation

## 2023-05-28 DIAGNOSIS — Z01810 Encounter for preprocedural cardiovascular examination: Secondary | ICD-10-CM | POA: Insufficient documentation

## 2023-05-28 HISTORY — DX: Other specified postprocedural states: Z98.890

## 2023-05-28 HISTORY — DX: Nausea with vomiting, unspecified: R11.2

## 2023-05-28 LAB — BASIC METABOLIC PANEL
Anion gap: 6 (ref 5–15)
BUN: 12 mg/dL (ref 8–23)
CO2: 27 mmol/L (ref 22–32)
Calcium: 9 mg/dL (ref 8.9–10.3)
Chloride: 108 mmol/L (ref 98–111)
Creatinine, Ser: 0.71 mg/dL (ref 0.61–1.24)
GFR, Estimated: >60 mL/min (ref >60)
Glucose, Bld: 98 mg/dL (ref 70–99)
Potassium: 3.9 mmol/L (ref 3.5–5.1)
Sodium: 141 mmol/L (ref 135–145)

## 2023-05-28 LAB — CBC
HCT: 43.2 % (ref 39.0–52.0)
Hemoglobin: 14 g/dL (ref 13.0–17.0)
MCH: 31.8 pg (ref 26.0–34.0)
MCHC: 32.4 g/dL (ref 30.0–36.0)
MCV: 98.2 fL (ref 80.0–100.0)
Platelets: 192 10*3/uL (ref 150–400)
RBC: 4.4 MIL/uL (ref 4.22–5.81)
RDW: 14.5 % (ref 11.5–15.5)
WBC: 7.4 10*3/uL (ref 4.0–10.5)
nRBC: 0 % (ref 0.0–0.2)

## 2023-05-28 LAB — SURGICAL PCR SCREEN
MRSA, PCR: NEGATIVE
Staphylococcus aureus: NEGATIVE

## 2023-05-29 ENCOUNTER — Ambulatory Visit (INDEPENDENT_AMBULATORY_CARE_PROVIDER_SITE_OTHER): Payer: Medicare Other | Admitting: Podiatry

## 2023-05-29 ENCOUNTER — Encounter: Payer: Self-pay | Admitting: Podiatry

## 2023-05-29 DIAGNOSIS — B351 Tinea unguium: Secondary | ICD-10-CM | POA: Diagnosis not present

## 2023-05-29 DIAGNOSIS — M79675 Pain in left toe(s): Secondary | ICD-10-CM

## 2023-05-29 DIAGNOSIS — L608 Other nail disorders: Secondary | ICD-10-CM

## 2023-05-29 DIAGNOSIS — M79674 Pain in right toe(s): Secondary | ICD-10-CM | POA: Diagnosis not present

## 2023-05-29 NOTE — Anesthesia Preprocedure Evaluation (Addendum)
Anesthesia Evaluation  Patient identified by MRN, date of birth, ID band Patient awake    Reviewed: Allergy & Precautions, NPO status , Patient's Chart, lab work & pertinent test results  History of Anesthesia Complications (+) PONV and history of anesthetic complications  Airway Mallampati: II  TM Distance: >3 FB Neck ROM: Full    Dental  (+) Missing,    Pulmonary sleep apnea , COPD, former smoker   Pulmonary exam normal        Cardiovascular hypertension, Pt. on medications + CAD  Normal cardiovascular exam  PET CT Cardiac Perfusion 04/09/2023   The study is normal. The study is low risk.   LV perfusion is normal. There is no evidence of ischemia. There is no evidence of infarction.   Rest left ventricular function is normal. Rest EF: 57 %. Stress left ventricular function is normal. Stress EF: 62 %. End diastolic cavity size is mildly enlarged. End systolic cavity size is normal. No evidence of transient ischemic dilation (TID) noted. Normal wall motion.   Myocardial blood flow was computed to be 0.46ml/g/min at rest and 1.64ml/g/min at stress. Global myocardial blood flow reserve was 2.72 and was normal.   Coronary calcium was present on the attenuation correction CT images. Severe coronary calcifications were present. Coronary calcifications were present in the left anterior descending artery, left circumflex artery and right coronary artery distribution(s).    Neuro/Psych   Anxiety Depression       GI/Hepatic Neg liver ROS,GERD  Medicated,,  Endo/Other  negative endocrine ROS    Renal/GU negative Renal ROS     Musculoskeletal  (+) Arthritis ,    Abdominal   Peds  Hematology negative hematology ROS (+)   Anesthesia Other Findings Day of surgery medications reviewed with patient.  Reproductive/Obstetrics                             Anesthesia Physical Anesthesia Plan  ASA:  2  Anesthesia Plan: General   Post-op Pain Management: Tylenol PO (pre-op)* and Regional block*   Induction: Intravenous  PONV Risk Score and Plan: 3 and Treatment may vary due to age or medical condition, Ondansetron, Dexamethasone, Midazolam and Propofol infusion  Airway Management Planned: Oral ETT  Additional Equipment: None  Intra-op Plan:   Post-operative Plan: Extubation in OR  Informed Consent: I have reviewed the patients History and Physical, chart, labs and discussed the procedure including the risks, benefits and alternatives for the proposed anesthesia with the patient or authorized representative who has indicated his/her understanding and acceptance.     Dental advisory given  Plan Discussed with: CRNA  Anesthesia Plan Comments: (See PAT note 05/28/2023)       Anesthesia Quick Evaluation

## 2023-05-29 NOTE — Progress Notes (Signed)
Anesthesia Chart Review   Case: [id number] Date/Time: 06/07/23 0715   Procedure: REVERSE SHOULDER ARTHROPLASTY (Right: Shoulder)   Anesthesia type: Choice   Pre-op diagnosis: right shoulder osteoarthritis   Location: [facility] ROOM 07 / [facility]   Surgeons: Gregory Low, MD       DISCUSSION:77 y.o. former smoker with h/o PONV, PTSD, COPD, CAD, mitral valve regurgitation, right shoulder OA scheduled for above procedure 06/07/2023 with Dr. Beverely Suarez.   Per cardiology preoperative evaluation 04/16/2023, "Chart reviewed as part of pre-operative protocol coverage. Given past medical history and time since last visit, based on ACC/AHA guidelines, Gregory Suarez would be at acceptable risk for the planned procedure without further cardiovascular testing.    His RCRI is a class II risk, 0.9% risk of major cardiac event.  Cardiac PET/CT 04/09/2023 showed Suarez risk and no evidence of ischemia."  Clearance from PCP on chart.  VS: BP (!) 148/57   Pulse (!) 50   Temp 36.9 C (Oral)   Resp 16   Ht 6\' 3"  (1.905 m)   Wt 102.5 kg   SpO2 100%   BMI 28.25 kg/m   PROVIDERS: Gregory Brooks, MD is PCP   Primary Cardiologist: Gregory Guadalajara, MD  LABS: Labs reviewed: Acceptable for surgery. (all labs ordered are listed, but only abnormal results are displayed)  Labs Reviewed  SURGICAL PCR SCREEN  CBC  BASIC METABOLIC PANEL     IMAGES:   EKG:   CV: PET CT Cardiac Perfusion 04/09/2023   The study is normal. The study is Suarez risk.   LV perfusion is normal. There is no evidence of ischemia. There is no evidence of infarction.   Rest left ventricular function is normal. Rest EF: 57 %. Stress left ventricular function is normal. Stress EF: 62 %. End diastolic cavity size is mildly enlarged. End systolic cavity size is normal. No evidence of transient ischemic dilation (TID) noted. Normal wall motion.   Myocardial blood flow was computed to be 0.61ml/g/min at rest and 1.38ml/g/min at stress. Global  myocardial blood flow reserve was 2.72 and was normal.   Coronary calcium was present on the attenuation correction CT images. Severe coronary calcifications were present. Coronary calcifications were present in the left anterior descending artery, left circumflex artery and right coronary artery distribution(s). Past Medical History:  Diagnosis Date   Anxiety    Arthritis    Basal cell carcinoma 06/28/2015   left temple - excised   Basal cell carcinoma 08/05/2017   right cheek inf to zygoma   Basal cell carcinoma 05/09/2020   right prox mandible   CAD (coronary artery disease)    COPD (chronic obstructive pulmonary disease) (HCC)    Depression    Elevated PSA    Erectile dysfunction    GERD (gastroesophageal reflux disease)    Hyperlipidemia    Mitral valve regurgitation    OSA treated with BiPAP    Pneumonia    Pneumothorax    ultimatley required talc pleurodiesis (2021)   PONV (postoperative nausea and vomiting)    Post traumatic stress disorder (PTSD)    RLS (restless legs syndrome)    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   right temple : sccis - shave   Tubular adenoma of colon     Past Surgical History:  Procedure Laterality Date   ANKLE SURGERY     BIOPSY  05/08/2022   Procedure: BIOPSY;  Surgeon: Dolores Frame, MD;  Location: [facility];  Service: Gastroenterology;;  CHOLECYSTECTOMY     CHOLECYSTECTOMY     COLONOSCOPY WITH PROPOFOL N/A 05/08/2022   Procedure: COLONOSCOPY WITH PROPOFOL;  Surgeon: Dolores Frame, MD;  [facility];  Service: Gastroenterology;  Laterality: N/A;  730 ASA 2   ESOPHAGOGASTRODUODENOSCOPY (EGD) WITH PROPOFOL N/A 05/08/2022   Procedure: ESOPHAGOGASTRODUODENOSCOPY (EGD) WITH PROPOFOL;  Surgeon: Dolores Frame, MD;  [facility]: [facility];  Service: Gastroenterology;  Laterality: N/A;   hydrocele surgery      LEFT HEART CATH AND CORONARY ANGIOGRAPHY N/A 02/13/2019   Procedure: LEFT  HEART CATH AND CORONARY ANGIOGRAPHY;  Surgeon: Lennette Bihari, MD;  Location: [facility]AS[facility];  Service: Cardiovascular;  Laterality: N/A;   MOHS SURGERY     POLYPECTOMY  05/08/2022   Procedure: POLYPECTOMY;  Surgeon: Marguerita Merles, Reuel Boom, MD;  Location: [facility];  Service: Gastroenterology;;    MEDICATIONS:  acetaminophen (TYLENOL) 500 MG tablet   Alpha-D-Galactosidase (BEANO) TABS   amLODipine (NORVASC) 2.5 MG tablet   aspirin EC 81 MG tablet   atorvastatin (LIPITOR) 80 MG tablet   benzonatate (TESSALON) 100 MG capsule   carbidopa-levodopa (SINEMET CR) 50-200 MG tablet   chlorpheniramine (CHLOR-TRIMETON) 4 MG tablet   cyanocobalamin (VITAMIN B12) 1000 MCG/ML injection   finasteride (PROSCAR) 5 MG tablet   hydrochlorothiazide (MICROZIDE) 12.5 MG capsule   hydrocortisone 2.5 % cream   ketoconazole (NIZORAL) 2 % cream   meclizine (ANTIVERT) 25 MG tablet   Melatonin 5 MG TABS   metoprolol tartrate (LOPRESSOR) 25 MG tablet   mometasone (ELOCON) 0.1 % cream   pantoprazole (PROTONIX) 40 MG tablet   tadalafil (CIALIS) 5 MG tablet   terbinafine (LAMISIL) 250 MG tablet   tiZANidine (ZANAFLEX) 4 MG tablet   valACYclovir (VALTREX) 1000 MG tablet   VITAMIN D-VITAMIN K PO   No current facility-administered medications for this encounter.    Gregory Cipro Ward, PA-C [facility] Pre-Surgical Testing [phone]

## 2023-05-29 NOTE — Progress Notes (Signed)
This patient presents to the office with chief complaint of long thick painful nails.  Patient says the nails are painful walking and wearing shoes.  This patient is unable to self treat.  This patient is unable to trim his  nails since she is unable to reach his nails.  She presents to the office for preventative foot care services.  General Appearance  Alert, conversant and in no acute stress.  Vascular  Dorsalis pedis and posterior tibial  pulses are absent   bilaterally.  Capillary return is within normal limits  bilaterally. Temperature is within normal limits  bilaterally.  Neurologic  Senn-Weinstein monofilament wire test within normal limits  bilaterally. Muscle power within normal limits bilaterally.  Nails Thick disfigured discolored nails with subungual debris hallux nails . No evidence of bacterial infection or drainage bilaterally.  Orthopedic  No limitations of motion  feet .  No crepitus or effusions noted.  No bony pathology or digital deformities noted. Mild HAV  B/L.  Hammer toes with deviated toe 3,4  B/L.  Prominent metsatarsal heads.  Cavus foot type  B/L.  Skin  normotropic skin with no porokeratosis noted bilaterally.  No signs of infections or ulcers noted.     Onychomycosis  Nails  B/L.  Pain in right toes  Pain in left toes  Debridement of nails both feet followed trimming the nails with dremel tool.    RTC 3 months.   Gregory Mayer DPM   

## 2023-06-07 ENCOUNTER — Other Ambulatory Visit: Payer: Self-pay

## 2023-06-07 ENCOUNTER — Observation Stay (HOSPITAL_COMMUNITY): Payer: Medicare Other

## 2023-06-07 ENCOUNTER — Encounter (HOSPITAL_COMMUNITY): Payer: Self-pay | Admitting: Orthopedic Surgery

## 2023-06-07 ENCOUNTER — Encounter (HOSPITAL_COMMUNITY)
Admission: RE | Disposition: A | Discharge status: Home / Self Care | Payer: Self-pay | Source: Ambulatory Visit | Attending: Orthopedic Surgery

## 2023-06-07 ENCOUNTER — Ambulatory Visit (HOSPITAL_COMMUNITY): Payer: Medicare Other | Admitting: Anesthesiology

## 2023-06-07 ENCOUNTER — Observation Stay (HOSPITAL_COMMUNITY)
Admission: RE | Admit: 2023-06-07 | Discharge: 2023-06-08 | Disposition: A | Discharge status: Home / Self Care | Payer: Medicare Other | Source: Ambulatory Visit | Attending: Orthopedic Surgery | Admitting: Orthopedic Surgery

## 2023-06-07 ENCOUNTER — Ambulatory Visit (HOSPITAL_COMMUNITY): Payer: Medicare Other | Admitting: Physician Assistant

## 2023-06-07 DIAGNOSIS — M75101 Unspecified rotator cuff tear or rupture of right shoulder, not specified as traumatic: Secondary | ICD-10-CM | POA: Diagnosis not present

## 2023-06-07 DIAGNOSIS — I10 Essential (primary) hypertension: Secondary | ICD-10-CM | POA: Diagnosis not present

## 2023-06-07 DIAGNOSIS — Z85038 Personal history of other malignant neoplasm of large intestine: Secondary | ICD-10-CM | POA: Diagnosis not present

## 2023-06-07 DIAGNOSIS — G8918 Other acute postprocedural pain: Secondary | ICD-10-CM | POA: Diagnosis not present

## 2023-06-07 DIAGNOSIS — Z01818 Encounter for other preprocedural examination: Secondary | ICD-10-CM

## 2023-06-07 DIAGNOSIS — Z96651 Presence of right artificial knee joint: Secondary | ICD-10-CM | POA: Diagnosis not present

## 2023-06-07 DIAGNOSIS — Z87891 Personal history of nicotine dependence: Secondary | ICD-10-CM | POA: Insufficient documentation

## 2023-06-07 DIAGNOSIS — Z85828 Personal history of other malignant neoplasm of skin: Secondary | ICD-10-CM | POA: Insufficient documentation

## 2023-06-07 DIAGNOSIS — J449 Chronic obstructive pulmonary disease, unspecified: Secondary | ICD-10-CM | POA: Insufficient documentation

## 2023-06-07 DIAGNOSIS — Z96611 Presence of right artificial shoulder joint: Principal | ICD-10-CM

## 2023-06-07 DIAGNOSIS — Z79899 Other long term (current) drug therapy: Secondary | ICD-10-CM | POA: Diagnosis not present

## 2023-06-07 DIAGNOSIS — I2510 Atherosclerotic heart disease of native coronary artery without angina pectoris: Secondary | ICD-10-CM | POA: Insufficient documentation

## 2023-06-07 DIAGNOSIS — Z7982 Long term (current) use of aspirin: Secondary | ICD-10-CM | POA: Diagnosis not present

## 2023-06-07 DIAGNOSIS — M19011 Primary osteoarthritis, right shoulder: Secondary | ICD-10-CM

## 2023-06-07 DIAGNOSIS — Z471 Aftercare following joint replacement surgery: Secondary | ICD-10-CM | POA: Diagnosis not present

## 2023-06-07 HISTORY — DX: Chronic obstructive pulmonary disease, unspecified: J44.9

## 2023-06-07 HISTORY — DX: Unspecified osteoarthritis, unspecified site: M19.90

## 2023-06-07 HISTORY — DX: Depression, unspecified: F32.A

## 2023-06-07 HISTORY — DX: Anxiety disorder, unspecified: F41.9

## 2023-06-07 HISTORY — DX: Post-traumatic stress disorder, unspecified: F43.10

## 2023-06-07 HISTORY — DX: Pneumonia, unspecified organism: J18.9

## 2023-06-07 HISTORY — PX: REVERSE SHOULDER ARTHROPLASTY: SHX5054

## 2023-06-07 SURGERY — ARTHROPLASTY, SHOULDER, TOTAL, REVERSE
Anesthesia: General | Site: Shoulder | Laterality: Right

## 2023-06-07 MED ORDER — TIZANIDINE HCL 4 MG PO TABS: 4.0000 mg | ORAL_TABLET | Freq: Three times a day (TID) | ORAL | 1 refills | Status: DC | PRN

## 2023-06-07 MED ORDER — STERILE WATER FOR IRRIGATION IR SOLN
Status: DC | PRN
  Administered 2023-06-07 (×2): 1000 mL

## 2023-06-07 MED ORDER — OXYCODONE HCL 5 MG PO TABS: 5.0000 mg | ORAL_TABLET | Freq: Once | ORAL | Status: DC | PRN

## 2023-06-07 MED ORDER — TADALAFIL 5 MG PO TABS: 5.0000 mg | ORAL_TABLET | Freq: Every day | ORAL | Status: DC

## 2023-06-07 MED ORDER — ATORVASTATIN CALCIUM 40 MG PO TABS
80.0000 mg | ORAL_TABLET | Freq: Every day | ORAL | Status: DC
  Administered 2023-06-07: 80 mg via ORAL
  Filled 2023-06-07 (×2): qty 2

## 2023-06-07 MED ORDER — TERBINAFINE HCL 250 MG PO TABS
250.0000 mg | ORAL_TABLET | Freq: Every day | ORAL | Status: DC
  Administered 2023-06-07: 250 mg via ORAL
  Filled 2023-06-07 (×2): qty 1

## 2023-06-07 MED ORDER — MELATONIN 5 MG PO TABS
10.0000 mg | ORAL_TABLET | Freq: Every day | ORAL | Status: DC
  Administered 2023-06-07: 10 mg via ORAL
  Filled 2023-06-07: qty 2

## 2023-06-07 MED ORDER — TIZANIDINE HCL 4 MG PO TABS: 4.0000 mg | ORAL_TABLET | Freq: Three times a day (TID) | ORAL | Status: DC | PRN

## 2023-06-07 MED ORDER — METOPROLOL TARTRATE 25 MG PO TABS
25.0000 mg | ORAL_TABLET | Freq: Two times a day (BID) | ORAL | Status: DC
  Administered 2023-06-07 – 2023-06-08 (×2): 25 mg via ORAL
  Filled 2023-06-07 (×3): qty 1

## 2023-06-07 MED ORDER — POLYETHYLENE GLYCOL 3350 17 G PO PACK: 17.0000 g | PACK | Freq: Every day | ORAL | Status: DC | PRN

## 2023-06-07 MED ORDER — BUPIVACAINE-EPINEPHRINE (PF) 0.25% -1:200000 IJ SOLN
INTRAMUSCULAR | Status: DC | PRN
  Administered 2023-06-07: 30 mL

## 2023-06-07 MED ORDER — VITAMIN D-VITAMIN K 20-120 MCG/0.1ML PO LIQD: Freq: Every day | ORAL | Status: DC

## 2023-06-07 MED ORDER — BUPIVACAINE-EPINEPHRINE (PF) 0.5% -1:200000 IJ SOLN
INTRAMUSCULAR | Status: DC | PRN
Start: 2023-06-07 — End: 2023-06-07
  Administered 2023-06-07: 15 mL via PERINEURAL

## 2023-06-07 MED ORDER — FENTANYL CITRATE (PF) 100 MCG/2ML IJ SOLN
INTRAMUSCULAR | Status: DC | PRN
  Administered 2023-06-07 (×2): 50 ug via INTRAVENOUS

## 2023-06-07 MED ORDER — KETOCONAZOLE 2 % EX CREA
1.0000 | TOPICAL_CREAM | CUTANEOUS | Status: DC
  Administered 2023-06-07: 1 via TOPICAL
  Filled 2023-06-07: qty 15

## 2023-06-07 MED ORDER — VALACYCLOVIR HCL 500 MG PO TABS: 1000.0000 mg | ORAL_TABLET | Freq: Every day | ORAL | Status: DC | PRN

## 2023-06-07 MED ORDER — SODIUM CHLORIDE 0.9 % IV SOLN: INTRAVENOUS | Status: DC

## 2023-06-07 MED ORDER — BENZONATATE 100 MG PO CAPS: 100.0000 mg | ORAL_CAPSULE | Freq: Three times a day (TID) | ORAL | Status: DC | PRN

## 2023-06-07 MED ORDER — BUPIVACAINE-EPINEPHRINE 0.25% -1:200000 IJ SOLN
INTRAMUSCULAR | Status: AC
  Filled 2023-06-07: qty 1

## 2023-06-07 MED ORDER — KETOCONAZOLE 2 % EX CREA
1.0000 | TOPICAL_CREAM | Freq: Every day | CUTANEOUS | Status: DC
  Filled 2023-06-07: qty 15

## 2023-06-07 MED ORDER — ACETAMINOPHEN 500 MG PO TABS
500.0000 mg | ORAL_TABLET | Freq: Four times a day (QID) | ORAL | Status: DC | PRN
  Administered 2023-06-07: 1000 mg via ORAL
  Filled 2023-06-07: qty 2

## 2023-06-07 MED ORDER — OXYCODONE HCL 5 MG/5ML PO SOLN: 5.0000 mg | Freq: Once | ORAL | Status: DC | PRN

## 2023-06-07 MED ORDER — HYDROCHLOROTHIAZIDE 12.5 MG PO TABS: 12.5000 mg | ORAL_TABLET | Freq: Every day | ORAL | Status: DC | PRN

## 2023-06-07 MED ORDER — ONDANSETRON HCL 4 MG/2ML IJ SOLN
INTRAMUSCULAR | Status: AC
  Filled 2023-06-07: qty 2

## 2023-06-07 MED ORDER — 0.9 % SODIUM CHLORIDE (POUR BTL) OPTIME
TOPICAL | Status: DC | PRN
  Administered 2023-06-07: 1000 mL

## 2023-06-07 MED ORDER — FINASTERIDE 5 MG PO TABS
5.0000 mg | ORAL_TABLET | Freq: Every day | ORAL | Status: DC
  Filled 2023-06-07 (×2): qty 1

## 2023-06-07 MED ORDER — HYDROCORTISONE 1 % EX CREA
TOPICAL_CREAM | CUTANEOUS | Status: DC
  Filled 2023-06-07: qty 28

## 2023-06-07 MED ORDER — DEXAMETHASONE SODIUM PHOSPHATE 10 MG/ML IJ SOLN
INTRAMUSCULAR | Status: DC | PRN
  Administered 2023-06-07: 8 mg via INTRAVENOUS

## 2023-06-07 MED ORDER — ROCURONIUM BROMIDE 10 MG/ML (PF) SYRINGE
PREFILLED_SYRINGE | INTRAVENOUS | Status: DC | PRN
  Administered 2023-06-07: 60 mg via INTRAVENOUS

## 2023-06-07 MED ORDER — MOMETASONE FUROATE 0.1 % EX CREA: 1.0000 | TOPICAL_CREAM | Freq: Every day | CUTANEOUS | Status: DC

## 2023-06-07 MED ORDER — MIDAZOLAM HCL 5 MG/5ML IJ SOLN
INTRAMUSCULAR | Status: DC | PRN
  Administered 2023-06-07: 1 mg via INTRAVENOUS

## 2023-06-07 MED ORDER — TRANEXAMIC ACID-NACL 1000-0.7 MG/100ML-% IV SOLN
1000.0000 mg | Freq: Once | INTRAVENOUS | Status: AC
  Administered 2023-06-07: 1000 mg via INTRAVENOUS
  Filled 2023-06-07: qty 100

## 2023-06-07 MED ORDER — SUGAMMADEX SODIUM 200 MG/2ML IV SOLN
INTRAVENOUS | Status: DC | PRN
  Administered 2023-06-07: 200 mg via INTRAVENOUS

## 2023-06-07 MED ORDER — PHENYLEPHRINE HCL (PRESSORS) 10 MG/ML IV SOLN
INTRAVENOUS | Status: AC
  Filled 2023-06-07: qty 1

## 2023-06-07 MED ORDER — MORPHINE SULFATE (PF) 2 MG/ML IV SOLN
0.5000 mg | INTRAVENOUS | Status: DC | PRN
  Administered 2023-06-08: 1 mg via INTRAVENOUS
  Filled 2023-06-07: qty 1

## 2023-06-07 MED ORDER — BUPIVACAINE HCL (PF) 0.25 % IJ SOLN
INTRAMUSCULAR | Status: AC
  Filled 2023-06-07: qty 30

## 2023-06-07 MED ORDER — BEANO PO TABS: 3.0000 | ORAL_TABLET | Freq: Every day | ORAL | Status: DC

## 2023-06-07 MED ORDER — ACETAMINOPHEN 325 MG PO TABS: 325.0000 mg | ORAL_TABLET | Freq: Four times a day (QID) | ORAL | Status: DC | PRN

## 2023-06-07 MED ORDER — DIPHENHYDRAMINE HCL 25 MG PO TABS: 25.0000 mg | ORAL_TABLET | Freq: Four times a day (QID) | ORAL | Status: DC

## 2023-06-07 MED ORDER — ASPIRIN 81 MG PO TBEC
81.0000 mg | DELAYED_RELEASE_TABLET | Freq: Every day | ORAL | Status: DC
  Administered 2023-06-07 – 2023-06-08 (×2): 81 mg via ORAL
  Filled 2023-06-07 (×2): qty 1

## 2023-06-07 MED ORDER — PROPOFOL 10 MG/ML IV BOLUS
INTRAVENOUS | Status: DC | PRN
  Administered 2023-06-07: 140 mg via INTRAVENOUS

## 2023-06-07 MED ORDER — METOCLOPRAMIDE HCL 5 MG PO TABS: 5.0000 mg | ORAL_TABLET | Freq: Three times a day (TID) | ORAL | Status: DC | PRN

## 2023-06-07 MED ORDER — DEXAMETHASONE SODIUM PHOSPHATE 10 MG/ML IJ SOLN
INTRAMUSCULAR | Status: AC
  Filled 2023-06-07: qty 1

## 2023-06-07 MED ORDER — BUPIVACAINE LIPOSOME 1.3 % IJ SUSP
INTRAMUSCULAR | Status: DC | PRN
  Administered 2023-06-07: 10 mL via PERINEURAL

## 2023-06-07 MED ORDER — MIDAZOLAM HCL 2 MG/2ML IJ SOLN
INTRAMUSCULAR | Status: AC
  Filled 2023-06-07: qty 2

## 2023-06-07 MED ORDER — FENTANYL CITRATE (PF) 100 MCG/2ML IJ SOLN
INTRAMUSCULAR | Status: AC
  Filled 2023-06-07: qty 2

## 2023-06-07 MED ORDER — PHENOL 1.4 % MT LIQD: 1.0000 | OROMUCOSAL | Status: DC | PRN

## 2023-06-07 MED ORDER — AMLODIPINE BESYLATE 5 MG PO TABS
2.5000 mg | ORAL_TABLET | Freq: Every evening | ORAL | Status: DC
  Administered 2023-06-07: 2.5 mg via ORAL
  Filled 2023-06-07: qty 1

## 2023-06-07 MED ORDER — BISACODYL 10 MG RE SUPP: 10.0000 mg | Freq: Every day | RECTAL | Status: DC | PRN

## 2023-06-07 MED ORDER — ROCURONIUM BROMIDE 10 MG/ML (PF) SYRINGE
PREFILLED_SYRINGE | INTRAVENOUS | Status: AC
  Filled 2023-06-07: qty 10

## 2023-06-07 MED ORDER — PROMETHAZINE HCL 25 MG/ML IJ SOLN: 6.2500 mg | INTRAMUSCULAR | Status: DC | PRN

## 2023-06-07 MED ORDER — PROPOFOL 10 MG/ML IV BOLUS
INTRAVENOUS | Status: AC
  Filled 2023-06-07: qty 20

## 2023-06-07 MED ORDER — PROPOFOL 500 MG/50ML IV EMUL
INTRAVENOUS | Status: DC | PRN
Start: 2023-06-07 — End: 2023-06-07
  Administered 2023-06-07: 130 ug/kg/min via INTRAVENOUS

## 2023-06-07 MED ORDER — EPINEPHRINE PF 1 MG/ML IJ SOLN
INTRAMUSCULAR | Status: AC
  Filled 2023-06-07: qty 1

## 2023-06-07 MED ORDER — CHLORHEXIDINE GLUCONATE 0.12 % MT SOLN
15.0000 mL | Freq: Once | OROMUCOSAL | Status: AC
  Administered 2023-06-07: 15 mL via OROMUCOSAL

## 2023-06-07 MED ORDER — ACETAMINOPHEN 500 MG PO TABS
1000.0000 mg | ORAL_TABLET | Freq: Once | ORAL | Status: AC
  Administered 2023-06-07: 1000 mg via ORAL
  Filled 2023-06-07: qty 2

## 2023-06-07 MED ORDER — DOCUSATE SODIUM 100 MG PO CAPS
100.0000 mg | ORAL_CAPSULE | Freq: Two times a day (BID) | ORAL | Status: DC
  Administered 2023-06-07 – 2023-06-08 (×3): 100 mg via ORAL
  Filled 2023-06-07 (×3): qty 1

## 2023-06-07 MED ORDER — CEFAZOLIN SODIUM-DEXTROSE 2-4 GM/100ML-% IV SOLN
2.0000 g | Freq: Four times a day (QID) | INTRAVENOUS | Status: AC
  Administered 2023-06-07 – 2023-06-08 (×3): 2 g via INTRAVENOUS
  Filled 2023-06-07 (×3): qty 100

## 2023-06-07 MED ORDER — PHENYLEPHRINE HCL-NACL 20-0.9 MG/250ML-% IV SOLN
INTRAVENOUS | Status: DC | PRN
  Administered 2023-06-07: 20 ug/min via INTRAVENOUS

## 2023-06-07 MED ORDER — FENTANYL CITRATE PF 50 MCG/ML IJ SOSY: 25.0000 ug | PREFILLED_SYRINGE | INTRAMUSCULAR | Status: DC | PRN

## 2023-06-07 MED ORDER — METOCLOPRAMIDE HCL 5 MG/ML IJ SOLN: 5.0000 mg | Freq: Three times a day (TID) | INTRAMUSCULAR | Status: DC | PRN

## 2023-06-07 MED ORDER — MENTHOL 3 MG MT LOZG: 1.0000 | LOZENGE | OROMUCOSAL | Status: DC | PRN

## 2023-06-07 MED ORDER — ONDANSETRON HCL 4 MG/2ML IJ SOLN: 4.0000 mg | Freq: Four times a day (QID) | INTRAMUSCULAR | Status: DC | PRN

## 2023-06-07 MED ORDER — LACTATED RINGERS IV SOLN: INTRAVENOUS | Status: DC

## 2023-06-07 MED ORDER — TRAMADOL HCL 50 MG PO TABS
50.0000 mg | ORAL_TABLET | Freq: Four times a day (QID) | ORAL | Status: DC | PRN
  Administered 2023-06-07 – 2023-06-08 (×2): 50 mg via ORAL
  Filled 2023-06-07 (×2): qty 1

## 2023-06-07 MED ORDER — PANTOPRAZOLE SODIUM 40 MG PO TBEC
40.0000 mg | DELAYED_RELEASE_TABLET | Freq: Two times a day (BID) | ORAL | Status: DC
  Administered 2023-06-07 – 2023-06-08 (×2): 40 mg via ORAL
  Filled 2023-06-07 (×2): qty 1

## 2023-06-07 MED ORDER — CEFAZOLIN SODIUM-DEXTROSE 2-4 GM/100ML-% IV SOLN
2.0000 g | INTRAVENOUS | Status: AC
  Administered 2023-06-07: 2 g via INTRAVENOUS
  Filled 2023-06-07: qty 100

## 2023-06-07 MED ORDER — ORAL CARE MOUTH RINSE: 15.0000 mL | Freq: Once | OROMUCOSAL | Status: AC

## 2023-06-07 MED ORDER — CARBIDOPA-LEVODOPA ER 50-200 MG PO TBCR
1.0000 | EXTENDED_RELEASE_TABLET | Freq: Every day | ORAL | Status: DC
  Administered 2023-06-07: 1 via ORAL
  Filled 2023-06-07: qty 1

## 2023-06-07 MED ORDER — LIDOCAINE 2% (20 MG/ML) 5 ML SYRINGE
INTRAMUSCULAR | Status: DC | PRN
  Administered 2023-06-07: 100 mg via INTRAVENOUS

## 2023-06-07 MED ORDER — TRAMADOL HCL 50 MG PO TABS
50.0000 mg | ORAL_TABLET | Freq: Four times a day (QID) | ORAL | 0 refills | Status: DC | PRN
Start: 2023-06-07 — End: 2024-06-06

## 2023-06-07 MED ORDER — TRANEXAMIC ACID-NACL 1000-0.7 MG/100ML-% IV SOLN
1000.0000 mg | INTRAVENOUS | Status: AC
  Administered 2023-06-07: 1000 mg via INTRAVENOUS
  Filled 2023-06-07: qty 100

## 2023-06-07 MED ORDER — ONDANSETRON HCL 4 MG PO TABS: 4.0000 mg | ORAL_TABLET | Freq: Four times a day (QID) | ORAL | Status: DC | PRN

## 2023-06-07 MED ORDER — CYANOCOBALAMIN 1000 MCG/ML IJ SOLN: 1000.0000 ug | INTRAMUSCULAR | Status: DC

## 2023-06-07 MED ORDER — MECLIZINE HCL 25 MG PO TABS: 25.0000 mg | ORAL_TABLET | Freq: Three times a day (TID) | ORAL | Status: DC | PRN

## 2023-06-07 SURGICAL SUPPLY — 69 items
AID PSTN UNV HD RSTRNT DISP (MISCELLANEOUS) ×1
BAG COUNTER SPONGE SURGICOUNT (BAG) IMPLANT
BAG SPEC THK2 15X12 ZIP CLS (MISCELLANEOUS)
BAG SPNG CNTER NS LX DISP (BAG)
BAG ZIPLOCK 12X15 (MISCELLANEOUS) IMPLANT
BIT DRILL 1.6MX128 (BIT) IMPLANT
BIT DRILL 170X2.5X (BIT) IMPLANT
BIT DRL 170X2.5X (BIT) ×1
BLADE SAG 18X100X1.27 (BLADE) ×2 IMPLANT
COVER BACK TABLE 60X90IN (DRAPES) ×2 IMPLANT
COVER SURGICAL LIGHT HANDLE (MISCELLANEOUS) ×2 IMPLANT
CUP HUMERAL 42 PLUS 3 (Orthopedic Implant) IMPLANT
DRAPE INCISE IOBAN 66X45 STRL (DRAPES) ×2 IMPLANT
DRAPE ORTHO SPLIT 77X108 STRL (DRAPES) ×2
DRAPE SHEET LG 3/4 BI-LAMINATE (DRAPES) ×2 IMPLANT
DRAPE SURG ORHT 6 SPLT 77X108 (DRAPES) ×4 IMPLANT
DRAPE TOP 10253 STERILE (DRAPES) ×2 IMPLANT
DRAPE U-SHAPE 47X51 STRL (DRAPES) ×2 IMPLANT
DRILL 2.5 (BIT) ×1
DRSG ADAPTIC 3X8 NADH LF (GAUZE/BANDAGES/DRESSINGS) ×2 IMPLANT
DURAPREP 26ML APPLICATOR (WOUND CARE) ×2 IMPLANT
ELECT BLADE TIP CTD 4 INCH (ELECTRODE) ×2 IMPLANT
ELECT NDL TIP 2.8 STRL (NEEDLE) ×2 IMPLANT
ELECT NEEDLE TIP 2.8 STRL (NEEDLE) ×2 IMPLANT
ELECT REM PT RETURN 15FT ADLT (MISCELLANEOUS) ×2 IMPLANT
EPIPHYSI RIGHT SZ 2 (Shoulder) ×1 IMPLANT
EPIPHYSIS RIGHT SZ 2 (Shoulder) IMPLANT
FACESHIELD WRAPAROUND (MASK) ×1 IMPLANT
FACESHIELD WRAPAROUND OR TEAM (MASK) ×2 IMPLANT
GAUZE PAD ABD 8X10 STRL (GAUZE/BANDAGES/DRESSINGS) ×2 IMPLANT
GAUZE SPONGE 4X4 12PLY STRL (GAUZE/BANDAGES/DRESSINGS) ×2 IMPLANT
GLENOSPHERE XTEND LAT 42+0 STD (Miscellaneous) IMPLANT
GLOVE BIOGEL PI IND STRL 7.5 (GLOVE) ×2 IMPLANT
GLOVE BIOGEL PI IND STRL 8.5 (GLOVE) ×2 IMPLANT
GLOVE ORTHO TXT STRL SZ7.5 (GLOVE) ×2 IMPLANT
GLOVE SURG ORTHO 8.5 STRL (GLOVE) ×2 IMPLANT
GOWN STRL REUS W/ TWL XL LVL3 (GOWN DISPOSABLE) ×4 IMPLANT
GOWN STRL REUS W/TWL XL LVL3 (GOWN DISPOSABLE) ×2
KIT BASIN OR (CUSTOM PROCEDURE TRAY) ×2 IMPLANT
KIT TURNOVER KIT A (KITS) IMPLANT
MANIFOLD NEPTUNE II (INSTRUMENTS) ×2 IMPLANT
METAGLENE DELTA EXTEND (Trauma) IMPLANT
METAGLENE DXTEND (Trauma) ×1 IMPLANT
NDL MAYO CATGUT SZ4 TPR NDL (NEEDLE) IMPLANT
NEEDLE MAYO CATGUT SZ4 (NEEDLE) ×1 IMPLANT
NS IRRIG 1000ML POUR BTL (IV SOLUTION) ×2 IMPLANT
PACK SHOULDER (CUSTOM PROCEDURE TRAY) ×2 IMPLANT
PIN GUIDE 1.2 (PIN) IMPLANT
PIN GUIDE GLENOPHERE 1.5MX300M (PIN) IMPLANT
PIN METAGLENE 2.5 (PIN) IMPLANT
PIN STEINMAN FIXATION KNEE (PIN) IMPLANT
RESTRAINT HEAD UNIVERSAL NS (MISCELLANEOUS) ×2 IMPLANT
SCREW 4.5X18MM (Screw) ×1 IMPLANT
SCREW 48L (Screw) IMPLANT
SCREW BN 18X4.5XSTRL SHLDR (Screw) IMPLANT
SCREW LOCK 42 (Screw) IMPLANT
SLING ARM FOAM STRAP LRG (SOFTGOODS) IMPLANT
SPIKE FLUID TRANSFER (MISCELLANEOUS) ×2 IMPLANT
SPONGE T-LAP 4X18 ~~LOC~~+RFID (SPONGE) IMPLANT
STEM DELTA DIA 10 HA (Stem) IMPLANT
STRIP CLOSURE SKIN 1/2X4 (GAUZE/BANDAGES/DRESSINGS) ×2 IMPLANT
SUT FIBERWIRE #2 38 T-5 BLUE (SUTURE) ×1
SUT MNCRL AB 4-0 PS2 18 (SUTURE) ×2 IMPLANT
SUT VIC AB 0 CT1 36 (SUTURE) ×2 IMPLANT
SUT VIC AB 0 CT2 27 (SUTURE) ×2 IMPLANT
SUT VIC AB 2-0 CT1 27 (SUTURE) ×1
SUT VIC AB 2-0 CT1 TAPERPNT 27 (SUTURE) ×2 IMPLANT
SUTURE FIBERWR #2 38 T-5 BLUE (SUTURE) ×2 IMPLANT
TOWEL OR 17X26 10 PK STRL BLUE (TOWEL DISPOSABLE) ×2 IMPLANT

## 2023-06-07 NOTE — Anesthesia Procedure Notes (Signed)
Anesthesia Regional Block: Interscalene brachial plexus block   Pre-Anesthetic Checklist: , timeout performed,  Correct Patient, Correct Site, Correct Laterality,  Correct Procedure, Correct Position, site marked,  Risks and benefits discussed,  Pre-op evaluation,  At surgeon's request and post-op pain management  Laterality: Right  Prep: Maximum Sterile Barrier Precautions used, chloraprep       Needles:  Injection technique: Single-shot  Needle Type: Echogenic Stimulator Needle     Needle Length: 4cm  Needle Gauge: 22     Additional Needles:   Procedures:,,,, ultrasound used (permanent image in chart),,    Narrative:  Start time: 06/07/2023 7:03 AM End time: 06/07/2023 7:06 AM Injection made incrementally with aspirations every 5 mL.  Performed by: Personally  Anesthesiologist: Kaylyn Layer, MD  Additional Notes: Risks, benefits, and alternative discussed. Patient gave consent for procedure. Patient prepped and draped in sterile fashion. Sedation administered, patient remains easily responsive to voice. Relevant anatomy identified with ultrasound guidance. Local anesthetic given in 5cc increments with no signs or symptoms of intravascular injection. No pain or paraesthesias with injection. Patient monitored throughout procedure with signs of LAST or immediate complications. Tolerated well. Ultrasound image placed in chart.  Amalia Greenhouse, MD

## 2023-06-07 NOTE — Anesthesia Procedure Notes (Signed)
Procedure Name: Intubation Date/Time: 06/07/2023 7:40 AM  Performed by: Ponciano Ort, CRNAPre-anesthesia Checklist: Patient identified, Emergency Drugs available, Suction available and Patient being monitored Patient Re-evaluated:Patient Re-evaluated prior to induction Oxygen Delivery Method: Circle system utilized Preoxygenation: Pre-oxygenation with 100% oxygen Induction Type: IV induction Ventilation: Mask ventilation without difficulty Laryngoscope Size: Mac and 3 Grade View: Grade II Tube type: Oral Tube size: 7.5 mm Number of attempts: 1 Airway Equipment and Method: Stylet and Oral airway Placement Confirmation: ETT inserted through vocal cords under direct vision, positive ETCO2 and breath sounds checked- equal and bilateral Secured at: 22 cm Tube secured with: Tape Dental Injury: Teeth and Oropharynx as per pre-operative assessment

## 2023-06-07 NOTE — Progress Notes (Signed)
Patient encouraged to ambulate this evening. Pt request to stand and sit along side of the bed for now. Pt complains of dizziness upon standing.

## 2023-06-07 NOTE — Progress Notes (Signed)
   06/07/23 1433  TOC Brief Assessment  Insurance and Status Reviewed  Patient has primary care physician Yes  Home environment has been reviewed Resides with spouse  Prior level of function: Indpendent at baseline  Prior/Current Home Services No current home services  Social Determinants of Health Reivew SDOH reviewed no interventions necessary  Readmission risk has been reviewed Yes  Transition of care needs no transition of care needs at this time

## 2023-06-07 NOTE — Transfer of Care (Signed)
Immediate Anesthesia Transfer of Care Note  Patient: Gregory Suarez  Procedure(s) Performed: REVERSE SHOULDER ARTHROPLASTY (Right: Shoulder)  Patient Location: PACU  Anesthesia Type:Regional and GA combined with regional for post-op pain  Level of Consciousness: awake, alert , oriented, and patient cooperative  Airway & Oxygen Therapy: Patient Spontanous Breathing and Patient connected to face mask oxygen  Post-op Assessment: Report given to RN and Post -op Vital signs reviewed and stable  Post vital signs: Reviewed and stable  Last Vitals:  Vitals Value Taken Time  BP    Temp    Pulse    Resp    SpO2      Last Pain:  Vitals:   06/07/23 0613  TempSrc: Oral         Complications: No notable events documented.

## 2023-06-07 NOTE — Discharge Instructions (Signed)
Ice to the shoulder constantly.  Keep the incision covered and clean and dry for one week, then ok to get it wet in the shower.  Do exercise as instructed several times per day.  DO NOT reach behind your back or push up out of a chair with the operative arm.  Use a sling while you are up and around for comfort, may remove while seated.  Keep pillow propped behind the operative elbow.  Follow up with Dr Ranell Patrick in two weeks in the office, call [phone] for appt  PLEASE CALL DR Geo Slone at [phone] (cell) with any questions or concerns

## 2023-06-07 NOTE — Anesthesia Postprocedure Evaluation (Signed)
Anesthesia Post Note  Patient: Gregory Suarez  Procedure(s) Performed: REVERSE SHOULDER ARTHROPLASTY (Right: Shoulder)     Patient location during evaluation: PACU Anesthesia Type: General Level of consciousness: awake and alert Pain management: pain level controlled Vital Signs Assessment: post-procedure vital signs reviewed and stable Respiratory status: spontaneous breathing, nonlabored ventilation and respiratory function stable Cardiovascular status: blood pressure returned to baseline Postop Assessment: no apparent nausea or vomiting Anesthetic complications: no   No notable events documented.  Last Vitals:  Vitals:   06/07/23 0932 06/07/23 0945  BP: 123/64 106/83  Pulse: 64 60  Resp: 19 13  Temp: (!) 36.4 C   SpO2: 99% 95%    Last Pain:  Vitals:   06/07/23 0945  TempSrc:   PainSc: 0-No pain                 Shanda Howells

## 2023-06-07 NOTE — Op Note (Signed)
Gregory Suarez, KORTAN MEDICAL RECORD NO: [id number] ACCOUNT NO: [id number] DATE OF BIRTH: 1946-09-29 FACILITY: [facility] LOCATION: [facility] PHYSICIAN: Almedia Balls. Ranell Patrick, MD  Operative Report   DATE OF PROCEDURE: 06/07/2023  PREOPERATIVE DIAGNOSIS:  Right shoulder end-stage arthritis.  POSTOPERATIVE DIAGNOSIS:  Right shoulder end-stage arthritis.  PROCEDURES PERFORMED:  Right reverse total shoulder replacement using DePuy Delta Xtend prosthesis with no subscap repair.  ATTENDING SURGEON:  Almedia Balls. Ranell Patrick, MD  ASSISTANT:  Konrad Felix Dixon, PA-C, who was scrubbed for the entire procedure, and necessary for satisfactory completion of surgery.  ANESTHESIA:  General anesthesia was used plus interscalene block.  ESTIMATED BLOOD LOSS:  175 mL.  FLUID REPLACEMENT:  1500 mL crystalloid.  COUNTS:  Instrument counts correct.  COMPLICATIONS:  No complications.  ANTIBIOTICS:  Perioperative antibiotics were given.  INDICATIONS:  The patient is a 77 year old male with worsening right shoulder pain due to end-stage arthritis.  The patient also has rotator cuff damage on his MRI.  Given the failure of conservative management, we discussed options, recommending reverse  shoulder replacement to eliminate pain and restore function.  Informed consent obtained.  DESCRIPTION OF PROCEDURE:  After an adequate level of general anesthesia was achieved, the patient was positioned in modified beach chair position.  Right shoulder correctly identified and sterile prep and drape performed.  Timeout called, verifying  correct patient, correct site. We entered the patient's shoulder using standard deltopectoral approach, starting at the coracoid process, extending down to the anterior humerus.  Dissection down through subcutaneous tissues using Bovie.  Cephalic vein  was identified and taken laterally with the deltoid, pectoralis taken medially.  Conjoined tendon identified and retracted medially.  Biceps tenodesed in  situ with 0 Vicryl figure of eight sutures x 2.  We then released the subscapularis off the lesser  tuberosity and tagged for protection of the axillary nerve.  We released the inferior capsule extending the shoulder and delivering the humeral head out of the wound.  We entered the proximal humerus with a 6 mm reamer and reamed up to a size 10.  The  patient had significant tapering of his humeral shaft, so tendon was as big as we could go.  Once we had our 10 mm reamed, we placed our 10 mm T handle guide and resected the head at 20 degrees of retroversion with the oscillating saw.  We removed excess  osteophytes with a rongeur.  We then subluxed the humerus posteriorly gaining good exposure of the glenoid face.  There was no cartilage on the humeral head or the glenoid.  We went ahead and placed our deep retractors.  We released the capsule, labrum  and then drilled our guide pin, centered low on the glenoid.  We reamed for the metaglene baseplate, did our peripheral hand reaming and then drilled our central peg hole.  Once the baseplate was prepared, we impacted the baseplate, which was HA coated  press fit prosthesis into the prepared glenoid.  We placed a 48 screw inferiorly, a 42 screw superiorly and an 18 nonlocked anteriorly.  We locked the superior and inferior screws.  Great baseplate, bone support and good stability.  We selected a 42+0  glenosphere and attached that to the baseplate with a screwdriver.  Next, we went to the humeral side and reamed for the 2 right metaphysis.  We then trialled with a 10 stem and the 2 right set on the 0 setting and placed in 20 degrees of retroversion.   We used a 42+3 trial,  placed that on the humeral tray, reduced the shoulder and we were happy with our soft tissue balancing.  We then removed all trial components from the humeral side.  We irrigated thoroughly and then used impaction grafting technique  with bone from the humeral head and the HA coated press fit  10 stem and the 2 right metaphysis set on the 0 setting and impacted in 20 degrees of retroversion.  With the stem in place, we selected the real 42+3 poly, placed on the humeral tray and  impacted it.  We then reduced the shoulder, had nice stability, throughout a full range of motion.  We then resected the subscap.  We irrigated thoroughly, closed the deltopectoral interval with 0 Vicryl suture followed by 2-0 Vicryl for subcutaneous  closure and 4-0 Monocryl for skin.  Steri-Strips applied followed by sterile dressing.  The patient tolerated surgery well.   MUK D: 06/07/2023 9:35:05 am T: 06/07/2023 10:02:00 am  JOB: [id number]/ [id number]

## 2023-06-07 NOTE — Interval H&P Note (Signed)
History and Physical Interval Note:  06/07/2023 7:28 AM  Gregory Suarez  has presented today for surgery, with the diagnosis of right shoulder osteoarthritis.  The various methods of treatment have been discussed with the patient and family. After consideration of risks, benefits and other options for treatment, the patient has consented to  Procedure(s): REVERSE SHOULDER ARTHROPLASTY (Right) as a surgical intervention.  The patient's history has been reviewed, patient examined, no change in status, stable for surgery.  I have reviewed the patient's chart and labs.  Questions were answered to the patient's satisfaction.     [name]

## 2023-06-07 NOTE — Progress Notes (Signed)
Proscar and Metoprolol held. Patient reports taking early this am prior to arriving to hospital for surgical procedure.

## 2023-06-07 NOTE — Brief Op Note (Signed)
06/07/2023  9:30 AM  PATIENT:  Rasheim Dahman  77 y.o. male  PRE-OPERATIVE DIAGNOSIS:  right shoulder osteoarthritis, end stage and rotator cuff tear  POST-OPERATIVE DIAGNOSIS:  right shoulder osteoarthritis. End stage and rotator cuff tear  PROCEDURE:  Procedure(s): REVERSE SHOULDER ARTHROPLASTY (Right) DePuy Delta Xtend with no subscap repair  SURGEON:  Surgeons and Role:    Beverely Low, MD - Primary  PHYSICIAN ASSISTANT:   ASSISTANTS: Thea Gist, PA-C   ANESTHESIA:   regional and general  EBL:  175 mL   BLOOD ADMINISTERED:none  DRAINS: none   LOCAL MEDICATIONS USED:  MARCAINE     SPECIMEN:  No Specimen  DISPOSITION OF SPECIMEN:  N/A  COUNTS:  YES  TOURNIQUET:  * No tourniquets in log *  DICTATION: .Other Dictation: Dictation Number [id number]  PLAN OF CARE: Admit for overnight observation  PATIENT DISPOSITION:  PACU - hemodynamically stable.   Delay start of Pharmacological VTE agent (>24hrs) due to surgical blood loss or risk of bleeding: not applicable

## 2023-06-08 DIAGNOSIS — Z85828 Personal history of other malignant neoplasm of skin: Secondary | ICD-10-CM | POA: Diagnosis not present

## 2023-06-08 DIAGNOSIS — I10 Essential (primary) hypertension: Secondary | ICD-10-CM | POA: Diagnosis not present

## 2023-06-08 DIAGNOSIS — M19011 Primary osteoarthritis, right shoulder: Secondary | ICD-10-CM | POA: Diagnosis not present

## 2023-06-08 DIAGNOSIS — Z87891 Personal history of nicotine dependence: Secondary | ICD-10-CM | POA: Diagnosis not present

## 2023-06-08 DIAGNOSIS — I2510 Atherosclerotic heart disease of native coronary artery without angina pectoris: Secondary | ICD-10-CM | POA: Diagnosis not present

## 2023-06-08 DIAGNOSIS — J449 Chronic obstructive pulmonary disease, unspecified: Secondary | ICD-10-CM | POA: Diagnosis not present

## 2023-06-08 MED ORDER — METHOCARBAMOL 500 MG PO TABS
500.0000 mg | ORAL_TABLET | Freq: Four times a day (QID) | ORAL | Status: DC | PRN
  Administered 2023-06-08: 500 mg via ORAL
  Filled 2023-06-08: qty 1

## 2023-06-08 MED ORDER — KETOROLAC TROMETHAMINE 30 MG/ML IJ SOLN
15.0000 mg | Freq: Once | INTRAMUSCULAR | Status: AC
  Administered 2023-06-08: 15 mg via INTRAVENOUS
  Filled 2023-06-08: qty 1

## 2023-06-08 MED ORDER — TIZANIDINE HCL 4 MG PO TABS: 4.0000 mg | ORAL_TABLET | Freq: Three times a day (TID) | ORAL | 0 refills | Status: AC | PRN

## 2023-06-08 MED ORDER — TRAMADOL HCL 50 MG PO TABS
50.0000 mg | ORAL_TABLET | Freq: Four times a day (QID) | ORAL | 0 refills | Status: AC | PRN
Start: 2023-06-08 — End: 2024-06-07

## 2023-06-08 MED ORDER — METHOCARBAMOL 500 MG PO TABS: 500.0000 mg | ORAL_TABLET | ORAL | Status: DC | PRN

## 2023-06-08 NOTE — Progress Notes (Signed)
Subjective: 1 Day Post-Op Procedure(s) (LRB): REVERSE SHOULDER ARTHROPLASTY (Right)  Patient reports pain as mild to moderate.  Denies fever, chills, N/V, CP, SOB.  Tolerating POs well.  Admits to flatus.  Notes that his is ready for D/C home today.  Wife at bedside.  Objective:   VITALS:  Temp:  [97.5 F (36.4 C)-98.6 F (37 C)] 97.5 F (36.4 C) (09/07 0524) Pulse Rate:  [54-82] 71 (09/07 0524) Resp:  [16-20] 19 (09/07 0524) BP: (124-147)/(60-77) 138/68 (09/07 0524) SpO2:  [93 %-96 %] 96 % (09/07 0524) Weight:  [102.5 kg] 102.5 kg (09/06 1102)  General: WDWN patient in NAD. Psych:  Appropriate mood and affect. Neuro:  A&O x 3, Moving all extremities, sensation intact to light touch HEENT:  EOMs intact Chest:  Even non-labored respirations Skin:  Sling/Aquacel C/D/I, no rashes or lesions Extremities: warm/dry, mild edema to R shoulder, no erythema or echymosis.  No lymphadenopathy. Pulses: Radial 2+ MSK:  ROM: Full wrist ROM, Grip strength 4/5   LABS No results for input(s): "HGB", "WBC", "PLT" in the last 72 hours. No results for input(s): "NA", "K", "CL", "CO2", "BUN", "CREATININE", "GLUCOSE" in the last 72 hours. No results for input(s): "LABPT", "INR" in the last 72 hours.   Assessment/Plan: 1 Day Post-Op Procedure(s) (LRB): REVERSE SHOULDER ARTHROPLASTY (Right)  Patient seen in rounds for Dr. Derrill Kay on when up and walking.  May take sling off for comfort when seated. Up with therapy D/C home today after working with therapy Plan for 2 week outpatient post-op visit.  Alfredo Martinez PA-C [facility] Office:  [phone]

## 2023-06-08 NOTE — Plan of Care (Signed)

## 2023-06-08 NOTE — Discharge Summary (Signed)
Physician Discharge Summary  Patient ID: Gregory Suarez MRN: [id number] DOB/AGE: [birth year] 77 y.o.  Admit date: 06/07/2023 Discharge date: 06/08/2023  Admission Diagnoses: R shoulder OA; hx of GERD, CAD, ED, mitral valve regurg, HTN, hyperlipidemia, abnormal cardiac CT angiography, chronic cough, RLS, tubular adenoma of colon, spontaneous pneumothorax, COPD, hx of R TKA, congenital pes cavus, low ferritin, colorectal CA, diarrhea.   Discharge Diagnoses:  Principal Problem:   S/P shoulder replacement, right Same as above  Discharged Condition: stable  Hospital Course: Patient presented to [facility] OR for elective R reverse TSA by Dr. Ranell Patrick.  The patient tolerated the procedure well without difficulty.  He was admitted to the hospital.  He worked well with therapy.  He tolerated his stay well without complication.  He is to be D/C'd home.  Consults:  PT  Significant Diagnostic Studies: N/A  Treatments: IV hydration, antibiotics: Ancef, analgesia: acetaminophen and tramadol, cardiac meds: metoprolol and amlodipine, anticoagulation: ASA, and surgery: as stated above.  Discharge Exam: Blood pressure 138/68, pulse 71, temperature (!) 97.5 F (36.4 C), resp. rate 19, height 6\' 3"  (1.905 m), weight 102.5 kg, SpO2 96%. General: WDWN patient in NAD. Psych:  Appropriate mood and affect. Neuro:  A&O x 3, Moving all extremities, sensation intact to light touch HEENT:  EOMs intact Chest:  Even non-labored respirations Skin:  Sling/Aquacel C/D/I, no rashes or lesions Extremities: warm/dry, mild edema to R shoulder, no erythema or echymosis.  No lymphadenopathy. Pulses: Radial 2+ MSK:  ROM: Full wrist ROM, Grip strength 4/5  Disposition: Discharge disposition: 01-Home or Self Care       Discharge Instructions     Call MD / Call 911   Complete by: As directed    If you experience chest pain or shortness of breath, CALL 911 and be transported to the hospital emergency room.  If you develope a  fever above 101 F, pus (white drainage) or increased drainage or redness at the wound, or calf pain, call your surgeon's office.   Constipation Prevention   Complete by: As directed    Drink plenty of fluids.  Prune juice may be helpful.  You may use a stool softener, such as Colace (over the counter) 100 mg twice a day.  Use MiraLax (over the counter) for constipation as needed.   Diet - low sodium heart healthy   Complete by: As directed    Increase activity slowly as tolerated   Complete by: As directed    Post-operative opioid taper instructions:   Complete by: As directed    POST-OPERATIVE OPIOID TAPER INSTRUCTIONS: It is important to wean off of your opioid medication as soon as possible. If you do not need pain medication after your surgery it is ok to stop day one. Opioids include: Codeine, Hydrocodone(Norco, Vicodin), Oxycodone(Percocet, oxycontin) and hydromorphone amongst others.  Long term and even short term use of opiods can cause: Increased pain response Dependence Constipation Depression Respiratory depression And more.  Withdrawal symptoms can include Flu like symptoms Nausea, vomiting And more Techniques to manage these symptoms Hydrate well Eat regular healthy meals Stay active Use relaxation techniques(deep breathing, meditating, yoga) Do Not substitute Alcohol to help with tapering If you have been on opioids for less than two weeks and do not have pain than it is ok to stop all together.  Plan to wean off of opioids This plan should start within one week post op of your joint replacement. Maintain the same interval or time between taking each dose and first  decrease the dose.  Cut the total daily intake of opioids by one tablet each day Next start to increase the time between doses. The last dose that should be eliminated is the evening dose.         Allergies as of 06/08/2023       Reactions   Tape    Pt allergic to tape used for CT insertion     Crestor [rosuvastatin] Rash, Other (See Comments)   "Did not feel well"         Medication List     TAKE these medications    acetaminophen 500 MG tablet Commonly known as: TYLENOL Take 500-1,000 mg by mouth every 6 (six) hours as needed for moderate pain.   amLODipine 2.5 MG tablet Commonly known as: NORVASC Take 1 tablet (2.5 mg total) by mouth every evening.   aspirin EC 81 MG tablet Take 1 tablet (81 mg total) by mouth daily.   atorvastatin 80 MG tablet Commonly known as: LIPITOR TAKE 1 TABLET BY MOUTH EVERY DAY   Beano Tabs Take 3 tablets by mouth at bedtime.   benzonatate 100 MG capsule Commonly known as: TESSALON Take 100 mg by mouth 3 (three) times daily as needed for cough.   carbidopa-levodopa 50-200 MG tablet Commonly known as: SINEMET CR Take 1 tablet by mouth at bedtime.   chlorpheniramine 4 MG tablet Commonly known as: CHLOR-TRIMETON Take 8 mg by mouth at bedtime.   cyanocobalamin 1000 MCG/ML injection Commonly known as: VITAMIN B12 Inject 1,000 mcg into the muscle every 30 (thirty) days.   finasteride 5 MG tablet Commonly known as: PROSCAR TAKE 1 TABLET (5 MG TOTAL) BY MOUTH DAILY.   hydrochlorothiazide 12.5 MG capsule Commonly known as: MICROZIDE Take 12.5 mg by mouth daily as needed (fluid/swelling).   hydrocortisone 2.5 % cream Apply topically as directed. Apply to scaly rash on face hs on Tuesday, Thursday, and Saturday   ketoconazole 2 % cream Commonly known as: NIZORAL Apply 1 application. topically daily. Qhs to feet for 1 month, Apply to scaly areas on face 3 times weekly, Monday, Wednesday and Friday What changed: when to take this   meclizine 25 MG tablet Commonly known as: ANTIVERT Take 25 mg by mouth 3 (three) times daily as needed for dizziness.   melatonin 5 MG Tabs Take 10 mg by mouth at bedtime.   metoprolol tartrate 25 MG tablet Commonly known as: LOPRESSOR TAKE 1 TABLET BY MOUTH TWICE A DAY   mometasone 0.1 %  cream Commonly known as: ELOCON Apply 1 Application topically daily. Apply to affected area of back up to 5 days per week prn   pantoprazole 40 MG tablet Commonly known as: PROTONIX TAKE 1 TABLET BY MOUTH TWICE A DAY   tadalafil 5 MG tablet Commonly known as: CIALIS Take 1 tablet (5 mg total) by mouth daily.   terbinafine 250 MG tablet Commonly known as: LamISIL Take 1 tablet (250 mg total) by mouth daily.   tiZANidine 4 MG tablet Commonly known as: Zanaflex Take 1 tablet (4 mg total) by mouth every 8 (eight) hours as needed for muscle spasms.   traMADol 50 MG tablet Commonly known as: Ultram Take 1 tablet (50 mg total) by mouth every 6 (six) hours as needed.   valACYclovir 1000 MG tablet Commonly known as: VALTREX Take 1,000 mg by mouth daily as needed (outbreak).   VITAMIN D-VITAMIN K PO Take 1 tablet by mouth daily.        Follow-up Information  Beverely Low, MD. Call in 2 week(s).   Specialty: Orthopedic Surgery Why: call [phone] for follow up appointment in two weeks with Dr Phineas Inches information: [address] [place] [address] [phone]                 Signed: Lolly Mustache Office:  [phone]

## 2023-06-08 NOTE — Progress Notes (Signed)
   06/08/23 0016  BiPAP/CPAP/SIPAP  $ Non-Invasive Ventilator  Non-Invasive Vent Initial  BiPAP/CPAP/SIPAP Pt Type Adult  BiPAP/CPAP/SIPAP DREAMSTATIOND  Mask Type Full face mask (Pt mask & Tubing)  Mask Size Large  Respiratory Rate 18 breaths/min  EPAP 15 cmH2O (Pt request pressure of 15 per his home regimen.)  Patient Home Equipment No  Auto Titrate No (CPAP 15)

## 2023-06-08 NOTE — Progress Notes (Addendum)
The patient is stable, incision and dressing site to right shoulder are clan dry intact. Pulse BUE 2 + warm and dry, capillary refill less than 3 sec. No change from am assessment.  Discharge instructions were reviewed with the patient and spouse. Patient states that he will resume medications according to his schedule. Questions, concerns denied at this time.

## 2023-06-08 NOTE — Evaluation (Signed)
Occupational Therapy Evaluation Patient Details Name: Gregory Suarez MRN: [id number] DOB: [birth year] Today's Date: 06/08/2023   History of Present Illness Mr. Gregory Suarez is a 77 yr old male who is s/p R reverse total shoulder replacement on 06-07-23, due to R shoulder end stage arthritis.   Clinical Impression   Pt is s/p shoulder replacement of R non-dominant upper extremity on 06-07-23, due to R shoulder end stage arthritis Therapist provided education and instruction to patient and spouse with regards to ROM/exercises, post-op precautions, UE and sling positioning, donning upper extremity clothing, recommendations for bathing while maintaining shoulder precautions, use of ice for pain and edema management and donning/doffing sling. Patient and spouse verbalized and demonstrated understanding as needed. Patient needed assistance to donn shirt, pants, and shoes with instruction on compensatory strategies to perform ADLs. Patient to follow up with MD for further therapy needs.         If plan is discharge home, recommend the following: Assistance with cooking/housework;Assist for transportation;Help with stairs or ramp for entrance;A little help with bathing/dressing/bathroom    Functional Status Assessment  Patient has had a recent decline in their functional status and demonstrates the ability to make significant improvements in function in a reasonable and predictable amount of time.  Equipment Recommendations  None recommended by OT    Recommendations for Other Services       Precautions / Restrictions Precautions Precautions: Shoulder Type of Shoulder Precautions: Sling on at all times except ADLs/exercise, okay to perform elbow, wrist, and hand ROM, no shoulder ROM Shoulder Interventions: Shoulder sling/immobilizer Required Braces or Orthoses: Sling Restrictions Weight Bearing Restrictions: Yes RUE Weight Bearing: Non weight bearing      Mobility Bed Mobility Overal bed mobility:  Needs Assistance Bed Mobility: Supine to Sit     Supine to sit: Used rails, Contact guard, HOB elevated          Transfers Overall transfer level: Needs assistance Equipment used: None Transfers: Sit to/from Stand Sit to Stand: Contact guard assist                  ADL either performed or assessed with clinical judgement      Vision Baseline Vision/History: 1 Wears glasses              Pertinent Vitals/Pain Pain Assessment Pain Assessment: 0-10 Pain Score: 4  Pain Location: R shoulder Pain Intervention(s): Limited activity within patient's tolerance, Monitored during session, Patient requesting pain meds-RN notified, Repositioned     Extremity/Trunk Assessment Upper Extremity Assessment Upper Extremity Assessment: Left hand dominant           Communication Communication Communication: No apparent difficulties   Cognition Arousal: Alert Behavior During Therapy: WFL for tasks assessed/performed Overall Cognitive Status: Within Functional Limits for tasks assessed      General Comments: Oriented x4, able to follow 1-2 step commands consistently           Shoulder Instructions Shoulder Instructions Donning/doffing shirt without moving shoulder: Caregiver independent with task Method for sponge bathing under operated UE:  (Caregiver and patient verbalized understanding) Donning/doffing sling/immobilizer: Minimal assistance (With patient's caregiver performing) Correct positioning of sling/immobilizer: Minimal assistance (With patient's caregiver performing) Pendulum exercises (written home exercise program):  (N/A) ROM for elbow, wrist and digits of operated UE: Caregiver independent with task;Patient able to independently direct caregiver Sling wearing schedule (on at all times/off for ADL's): Caregiver independent with task;Patient able to independently direct caregiver Proper positioning of operated UE when showering: Caregiver independent with  task;Patient able to independently direct caregiver Dressing change:  (N/A) Positioning of UE while sleeping: Caregiver independent with task    Home Living Family/patient expects to be discharged to:: Private residence Living Arrangements: Spouse/significant other Available Help at Discharge: Family Type of Home: House Home Access: Stairs to enter Secretary/administrator of Steps: 1   Home Layout: Two level;1/2 bath on main level;Able to live on main level with bedroom/bathroom;Full bath on main level     Bathroom Shower/Tub: Walk-in shower         Home Equipment: BSC/3in1;Wheelchair - Biomedical scientist (2 wheels);Lift chair   Additional Comments: Has a CPAP and lift chair      Prior Functioning/Environment Prior Level of Function : Independent/Modified Independent;Driving             Mobility Comments: He was independent with ambulation. ADLs Comments: He was independent with ADLs and he shared household chores with his spouse.        OT Problem List: Impaired UE functional use      OT Treatment/Interventions:   N/A      OT Frequency:  N/A       AM-PAC OT "6 Clicks" Daily Activity     Outcome Measure Help from another person eating meals?: None Help from another person taking care of personal grooming?: None Help from another person toileting, which includes using toliet, bedpan, or urinal?: A Little Help from another person bathing (including washing, rinsing, drying)?: A Little Help from another person to put on and taking off regular upper body clothing?: A Little Help from another person to put on and taking off regular lower body clothing?: A Little 6 Click Score: 20   End of Session Equipment Utilized During Treatment: Other (comment) Nurse Communication: Other (comment) (Shoulder education completed)  Activity Tolerance: Patient tolerated treatment well Patient left: in bed;with call bell/phone within reach;with family/visitor  present  OT Visit Diagnosis: Muscle weakness (generalized) (M62.81)                Time: 4034-7425 OT Time Calculation (min): 43 min Charges:  OT General Charges $OT Visit: 1 Visit OT Evaluation $OT Eval Low Complexity: 1 Low OT Treatments $Self Care/Home Management : 8-22 mins    Reuben Likes, OTR/L 06/08/2023, 2:02 PM

## 2023-06-10 ENCOUNTER — Encounter (HOSPITAL_COMMUNITY): Payer: Self-pay | Admitting: Orthopedic Surgery

## 2023-06-20 DIAGNOSIS — Z4789 Encounter for other orthopedic aftercare: Secondary | ICD-10-CM | POA: Diagnosis not present

## 2023-06-25 ENCOUNTER — Other Ambulatory Visit: Payer: Self-pay

## 2023-06-25 ENCOUNTER — Other Ambulatory Visit: Payer: Self-pay | Admitting: Cardiovascular Disease

## 2023-06-25 DIAGNOSIS — Z0000 Encounter for general adult medical examination without abnormal findings: Secondary | ICD-10-CM

## 2023-06-25 NOTE — Telephone Encounter (Signed)
Requested medications are due for refill today.  yes  Requested medications are on the active medications list.  yes  Last refill. 04/24/2022 #180 3 rf  Future visit scheduled.   no  Notes to clinic.  Pt is due for CPE.    Requested Prescriptions  Pending Prescriptions Disp Refills   metoprolol tartrate (LOPRESSOR) 25 MG tablet 180 tablet 3    Sig: Take 1 tablet (25 mg total) by mouth 2 (two) times daily.     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[facility])   [facility] Donita Brooks, MD   3 years ago Skin lesion of face   [facility] Donita Brooks, MD   3 years ago Other pneumothorax   [facility] Tanya Nones, Priscille Heidelberg, MD   3 years ago Shortness of breath   [facility] Elmore Guise, FNP       Future Appointments             In 6 months Deirdre Evener, MD [facility]            Passed - Last BP in normal range    BP Readings from Last 1 Encounters:  06/08/23 137/73         Passed - Last Heart Rate in normal range    Pulse Readings from Last 1 Encounters:  06/08/23 69

## 2023-06-25 NOTE — Telephone Encounter (Signed)
Prescription Request  06/25/2023  LOV: 12/25/22 CPE  What is the name of the medication or equipment? metoprolol tartrate (LOPRESSOR) 25 MG tablet [[id number]]  Have you contacted your pharmacy to request a refill? No   Which pharmacy would you like this sent to?  [id number] Hassell Halim [address] [place] [address] Phone: [phone] Fax: [phone]    Patient notified that their request is being sent to the clinical staff for review and that they should receive a response within 2 business days.   Please advise at [facility] [phone]

## 2023-06-27 ENCOUNTER — Other Ambulatory Visit: Payer: Self-pay | Admitting: Family Medicine

## 2023-06-27 DIAGNOSIS — I10 Essential (primary) hypertension: Secondary | ICD-10-CM

## 2023-06-27 DIAGNOSIS — N529 Male erectile dysfunction, unspecified: Secondary | ICD-10-CM

## 2023-06-27 DIAGNOSIS — N400 Enlarged prostate without lower urinary tract symptoms: Secondary | ICD-10-CM

## 2023-06-28 DIAGNOSIS — G2581 Restless legs syndrome: Secondary | ICD-10-CM | POA: Diagnosis not present

## 2023-06-28 DIAGNOSIS — E538 Deficiency of other specified B group vitamins: Secondary | ICD-10-CM | POA: Diagnosis not present

## 2023-06-28 DIAGNOSIS — R2689 Other abnormalities of gait and mobility: Secondary | ICD-10-CM | POA: Diagnosis not present

## 2023-06-28 DIAGNOSIS — R7989 Other specified abnormal findings of blood chemistry: Secondary | ICD-10-CM | POA: Diagnosis not present

## 2023-06-28 DIAGNOSIS — G629 Polyneuropathy, unspecified: Secondary | ICD-10-CM | POA: Diagnosis not present

## 2023-06-28 DIAGNOSIS — G4752 REM sleep behavior disorder: Secondary | ICD-10-CM | POA: Diagnosis not present

## 2023-06-28 NOTE — Telephone Encounter (Signed)
LOV 12/25/22  Pharmacy sent script to follow up on refill requested for   metoprolol tartrate (LOPRESSOR) 25 MG tablet [[id number]]/ SIG: TAKE 1 TABLET BY MOUTH TWICE A DAY/QTY: 180     **90 day script requested**  Lov 12/25/22  Pharmacy:  [id number] Nicholes Rough, [facility] [address], [place] [address] Phone: [phone]  Fax: [phone] DEA #: [id number]   Please advise pharmacist.

## 2023-06-28 NOTE — Telephone Encounter (Signed)
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)   [facility] Donita Brooks, MD   3 years ago Skin lesion of face   [facility] Donita Brooks, MD   3 years ago Other pneumothorax   [facility] Donita Brooks, MD   3 years ago Shortness of breath   [facility] Elmore Guise, FNP       Future Appointments             In 6 months Deirdre Evener, MD [facility]            Passed - AST in normal range and within 360 days    AST  Date Value Ref Range Status  12/19/2022 23 10 - 35 U/L Final         Passed - ALT in normal range and within 360 days    ALT  Date Value Ref Range Status  12/19/2022 14 9 - 46 U/L Final         Passed - Last BP in normal range    BP Readings from Last 1 Encounters:  06/08/23 137/73

## 2023-07-01 DIAGNOSIS — G2581 Restless legs syndrome: Secondary | ICD-10-CM | POA: Diagnosis not present

## 2023-07-03 MED ORDER — METOPROLOL TARTRATE 25 MG PO TABS: 25.0000 mg | ORAL_TABLET | Freq: Two times a day (BID) | ORAL | 1 refills | Status: DC

## 2023-08-01 ENCOUNTER — Other Ambulatory Visit: Payer: Self-pay | Admitting: Family Medicine

## 2023-08-01 NOTE — Telephone Encounter (Signed)
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)   [facility] Donita Brooks, MD   3 years ago Skin lesion of face   [facility] Donita Brooks, MD   3 years ago Other pneumothorax   [facility] Tanya Nones, Priscille Heidelberg, MD   3 years ago Shortness of breath   [facility] Elmore Guise, FNP       Future Appointments             In 4 months Deirdre Evener, MD [facility]            Passed - Last BP in normal range    BP Readings from Last 1 Encounters:  06/08/23 137/73         Passed - Last Heart Rate in normal range    Pulse Readings from Last 1 Encounters:  06/08/23 69

## 2023-08-02 DIAGNOSIS — Z659 Problem related to unspecified psychosocial circumstances: Secondary | ICD-10-CM | POA: Insufficient documentation

## 2023-08-28 DIAGNOSIS — M25512 Pain in left shoulder: Secondary | ICD-10-CM | POA: Diagnosis not present

## 2023-08-28 DIAGNOSIS — Z4789 Encounter for other orthopedic aftercare: Secondary | ICD-10-CM | POA: Diagnosis not present

## 2023-09-02 ENCOUNTER — Ambulatory Visit (INDEPENDENT_AMBULATORY_CARE_PROVIDER_SITE_OTHER): Payer: Medicare Other | Admitting: Podiatry

## 2023-09-02 ENCOUNTER — Encounter: Payer: Self-pay | Admitting: Podiatry

## 2023-09-02 DIAGNOSIS — M79675 Pain in left toe(s): Secondary | ICD-10-CM

## 2023-09-02 DIAGNOSIS — B351 Tinea unguium: Secondary | ICD-10-CM | POA: Diagnosis not present

## 2023-09-02 DIAGNOSIS — M79674 Pain in right toe(s): Secondary | ICD-10-CM | POA: Diagnosis not present

## 2023-09-02 DIAGNOSIS — L608 Other nail disorders: Secondary | ICD-10-CM | POA: Diagnosis not present

## 2023-09-02 NOTE — Progress Notes (Signed)
This patient presents to the office with chief complaint of long thick painful nails.  Patient says the nails are painful walking and wearing shoes.  This patient is unable to self treat.  This patient is unable to trim his  nails since she is unable to reach his nails.  She presents to the office for preventative foot care services.  General Appearance  Alert, conversant and in no acute stress.  Vascular  Dorsalis pedis and posterior tibial  pulses are absent   bilaterally.  Capillary return is within normal limits  bilaterally. Temperature is within normal limits  bilaterally.  Neurologic  Senn-Weinstein monofilament wire test within normal limits  bilaterally. Muscle power within normal limits bilaterally.  Nails Thick disfigured discolored nails with subungual debris hallux nails . No evidence of bacterial infection or drainage bilaterally.  Orthopedic  No limitations of motion  feet .  No crepitus or effusions noted.  No bony pathology or digital deformities noted. Mild HAV  B/L.  Hammer toes with deviated toe 3,4  B/L.  Prominent metsatarsal heads.  Cavus foot type  B/L.  Skin  normotropic skin with no porokeratosis noted bilaterally.  No signs of infections or ulcers noted.     Onychomycosis  Nails  B/L.  Pain in right toes  Pain in left toes  Debridement of nails both feet followed trimming the nails with dremel tool.    RTC 3 months.   Eve Rey DPM   

## 2023-09-30 ENCOUNTER — Telehealth: Payer: Self-pay

## 2023-09-30 ENCOUNTER — Other Ambulatory Visit: Payer: Self-pay | Admitting: Family Medicine

## 2023-09-30 MED ORDER — NIRMATRELVIR/RITONAVIR (PAXLOVID)TABLET: 3.0000 | ORAL_TABLET | Freq: Two times a day (BID) | ORAL | 0 refills | Status: AC

## 2023-09-30 NOTE — Telephone Encounter (Signed)
Copied from CRM [id number]. Topic: Clinical - Medication Question >> Sep 30, 2023  8:03 AM Fonda Kinder J wrote: Reason for CRM: Pt tested positive for covid and wants to know if he could be prescribed the antibiotic for covid

## 2023-10-27 ENCOUNTER — Other Ambulatory Visit: Payer: Self-pay | Admitting: Family Medicine

## 2023-11-01 DIAGNOSIS — G629 Polyneuropathy, unspecified: Secondary | ICD-10-CM | POA: Diagnosis not present

## 2023-11-01 DIAGNOSIS — G2581 Restless legs syndrome: Secondary | ICD-10-CM | POA: Diagnosis not present

## 2023-11-01 DIAGNOSIS — G4733 Obstructive sleep apnea (adult) (pediatric): Secondary | ICD-10-CM | POA: Diagnosis not present

## 2023-11-01 DIAGNOSIS — L819 Disorder of pigmentation, unspecified: Secondary | ICD-10-CM | POA: Diagnosis not present

## 2023-11-01 DIAGNOSIS — I998 Other disorder of circulatory system: Secondary | ICD-10-CM | POA: Diagnosis not present

## 2023-11-01 DIAGNOSIS — R2689 Other abnormalities of gait and mobility: Secondary | ICD-10-CM | POA: Diagnosis not present

## 2023-11-01 DIAGNOSIS — I890 Lymphedema, not elsewhere classified: Secondary | ICD-10-CM | POA: Diagnosis not present

## 2023-11-08 ENCOUNTER — Other Ambulatory Visit: Payer: Self-pay | Admitting: Family Medicine

## 2023-12-03 ENCOUNTER — Encounter: Payer: Self-pay | Admitting: Podiatry

## 2023-12-03 ENCOUNTER — Ambulatory Visit (INDEPENDENT_AMBULATORY_CARE_PROVIDER_SITE_OTHER): Payer: Medicare Other | Admitting: Podiatry

## 2023-12-03 DIAGNOSIS — B351 Tinea unguium: Secondary | ICD-10-CM | POA: Diagnosis not present

## 2023-12-03 DIAGNOSIS — L608 Other nail disorders: Secondary | ICD-10-CM

## 2023-12-03 DIAGNOSIS — M79674 Pain in right toe(s): Secondary | ICD-10-CM | POA: Diagnosis not present

## 2023-12-03 DIAGNOSIS — M79675 Pain in left toe(s): Secondary | ICD-10-CM | POA: Diagnosis not present

## 2023-12-03 NOTE — Progress Notes (Signed)
This patient presents to the office with chief complaint of long thick painful nails.  Patient says the nails are painful walking and wearing shoes.  This patient is unable to self treat.  This patient is unable to trim his  nails since she is unable to reach his nails.  She presents to the office for preventative foot care services.  General Appearance  Alert, conversant and in no acute stress.  Vascular  Dorsalis pedis and posterior tibial  pulses are absent   bilaterally.  Capillary return is within normal limits  bilaterally. Temperature is within normal limits  bilaterally.  Neurologic  Senn-Weinstein monofilament wire test within normal limits  bilaterally. Muscle power within normal limits bilaterally.  Nails Thick disfigured discolored nails with subungual debris hallux nails . No evidence of bacterial infection or drainage bilaterally.  Orthopedic  No limitations of motion  feet .  No crepitus or effusions noted.  No bony pathology or digital deformities noted. Mild HAV  B/L.  Hammer toes with deviated toe 3,4  B/L.  Prominent metsatarsal heads.  Cavus foot type  B/L.  Skin  normotropic skin with no porokeratosis noted bilaterally.  No signs of infections or ulcers noted.     Onychomycosis  Nails  B/L.  Pain in right toes  Pain in left toes  Debridement of nails both feet followed trimming the nails with dremel tool.    RTC 3 months.   Eve Rey DPM   

## 2023-12-26 ENCOUNTER — Ambulatory Visit: Admitting: Dermatology

## 2023-12-26 ENCOUNTER — Ambulatory Visit: Payer: No Typology Code available for payment source | Admitting: Dermatology

## 2023-12-26 DIAGNOSIS — L821 Other seborrheic keratosis: Secondary | ICD-10-CM | POA: Diagnosis not present

## 2023-12-26 DIAGNOSIS — D1801 Hemangioma of skin and subcutaneous tissue: Secondary | ICD-10-CM | POA: Diagnosis not present

## 2023-12-26 DIAGNOSIS — L219 Seborrheic dermatitis, unspecified: Secondary | ICD-10-CM

## 2023-12-26 DIAGNOSIS — L814 Other melanin hyperpigmentation: Secondary | ICD-10-CM | POA: Diagnosis not present

## 2023-12-26 DIAGNOSIS — L209 Atopic dermatitis, unspecified: Secondary | ICD-10-CM

## 2023-12-26 DIAGNOSIS — D492 Neoplasm of unspecified behavior of bone, soft tissue, and skin: Secondary | ICD-10-CM | POA: Diagnosis not present

## 2023-12-26 DIAGNOSIS — W908XXA Exposure to other nonionizing radiation, initial encounter: Secondary | ICD-10-CM

## 2023-12-26 DIAGNOSIS — D692 Other nonthrombocytopenic purpura: Secondary | ICD-10-CM | POA: Diagnosis not present

## 2023-12-26 DIAGNOSIS — Z1283 Encounter for screening for malignant neoplasm of skin: Secondary | ICD-10-CM | POA: Diagnosis not present

## 2023-12-26 DIAGNOSIS — D489 Neoplasm of uncertain behavior, unspecified: Secondary | ICD-10-CM

## 2023-12-26 DIAGNOSIS — L578 Other skin changes due to chronic exposure to nonionizing radiation: Secondary | ICD-10-CM

## 2023-12-26 DIAGNOSIS — L308 Other specified dermatitis: Secondary | ICD-10-CM

## 2023-12-26 DIAGNOSIS — L570 Actinic keratosis: Secondary | ICD-10-CM | POA: Diagnosis not present

## 2023-12-26 DIAGNOSIS — L2081 Atopic neurodermatitis: Secondary | ICD-10-CM

## 2023-12-26 DIAGNOSIS — Z85828 Personal history of other malignant neoplasm of skin: Secondary | ICD-10-CM

## 2023-12-26 DIAGNOSIS — L309 Dermatitis, unspecified: Secondary | ICD-10-CM | POA: Diagnosis not present

## 2023-12-26 DIAGNOSIS — Z86007 Personal history of in-situ neoplasm of skin: Secondary | ICD-10-CM

## 2023-12-26 DIAGNOSIS — D229 Melanocytic nevi, unspecified: Secondary | ICD-10-CM

## 2023-12-26 HISTORY — DX: Actinic keratosis: L57.0

## 2023-12-26 NOTE — Progress Notes (Signed)
 Follow-Up Visit   Subjective  Gregory Suarez is a 78 y.o. male who presents for the following: Skin Cancer Screening and Full Body Skin Exam Hx of Itchy spots shoulders, back of neck, face, left hip area  The patient presents for Total-Body Skin Exam (TBSE) for skin cancer screening and mole check. The patient has spots, moles and lesions to be evaluated, some may be new or changing and the patient may have concern these could be cancer.    The following portions of the chart were reviewed this encounter and updated as appropriate: medications, allergies, medical history  Review of Systems:  No other skin or systemic complaints except as noted in HPI or Assessment and Plan.  Objective  Well appearing patient in no apparent distress; mood and affect are within normal limits.  A full examination was performed including scalp, head, eyes, ears, nose, lips, neck, chest, axillae, abdomen, back, buttocks, bilateral upper extremities, bilateral lower extremities, hands, feet, fingers, toes, fingernails, and toenails. All findings within normal limits unless otherwise noted below.   Relevant physical exam findings are noted in the Assessment and Plan.  right preauricular  x 1, nasal tip x 1 (2) Erythematous thin papules/macules with gritty scale.  right lateral cheek 5 mm pink crusted papule   left zygoma 5 mm pink pearly papule    Assessment & Plan   SKIN CANCER SCREENING PERFORMED TODAY.  ACTINIC DAMAGE - Chronic condition, secondary to cumulative UV/sun exposure - diffuse scaly erythematous macules with underlying dyspigmentation - Recommend daily broad spectrum sunscreen SPF 30+ to sun-exposed areas, reapply every 2 hours as needed.  - Staying in the shade or wearing long sleeves, sun glasses (UVA+UVB protection) and wide brim hats (4-inch brim around the entire circumference of the hat) are also recommended for sun protection.  - Call for new or changing lesions.  LENTIGINES,  SEBORRHEIC KERATOSES, HEMANGIOMAS - Benign normal skin lesions - Benign-appearing - Call for any changes  MELANOCYTIC NEVI - Tan-brown and/or pink-flesh-colored symmetric macules and papules - Benign appearing on exam today - Observation - Call clinic for new or changing moles - Recommend daily use of broad spectrum spf 30+ sunscreen to sun-exposed areas.  Purpura - Chronic; persistent and recurrent.  Treatable, but not curable. 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 Condition is improving with treatment but not currently at goal.    Seborrheic Dermatitis is a chronic persistent rash characterized by pinkness and scaling most commonly of the mid face but also can occur on the scalp (dandruff), ears; mid chest, mid back and groin.  It tends to be exacerbated by stress and cooler weather.  People who have neurologic disease may experience new onset or exacerbation of existing seborrheic dermatitis.  The condition is not curable but treatable and can be controlled.   Treatment Plan: Continue 1% hydrocortisone cream every day/bid prn flares Can send in rfs Ketoconazole 2% cream when needed  ATOPIC DERMATITIS Exam: Scaly pink patches back  Chronic and persistent condition with duration or expected duration over one year. Condition is improving with treatment but  not currently at goal.   Atopic dermatitis (eczema) is a chronic, relapsing, pruritic condition that can significantly affect quality of life. It is often associated with allergic rhinitis and/or asthma and can  require treatment with topical medications, phototherapy, or in severe cases biologic injectable medication (Dupixent; Adbry) or Oral JAK inhibitors.   Treatment Plan: Continue Mometasone cream qd up to 5 times per week Recommend gentle skin care.    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  - Call if any new or changing lesions are noted between office visits   ACTINIC KERATOSIS (2) right preauricular  x 1, nasal tip x 1 (2) Actinic keratoses are precancerous spots that appear secondary to cumulative UV radiation exposure/sun exposure over time. They are chronic with expected duration over 1 year. A portion of actinic keratoses will progress to squamous cell carcinoma of the skin. It is not possible to reliably predict which spots will progress to skin cancer and so treatment is recommended to prevent development of skin cancer.  Recommend daily broad spectrum sunscreen SPF 30+ to sun-exposed areas, reapply every 2 hours as needed.  Recommend staying in the shade or wearing long sleeves, sun glasses (UVA+UVB protection) and wide brim hats (4-inch brim around the entire circumference of the hat). Call for new or changing lesions. Destruction of lesion - right preauricular  x 1, nasal tip x 1 (2)  Destruction method: cryotherapy   Informed consent: discussed and consent  obtained   Lesion destroyed using liquid nitrogen: Yes   Region frozen until ice ball extended beyond lesion: Yes   Outcome: patient tolerated procedure well with no complications   Post-procedure details: wound care instructions given   Additional details:  Prior to procedure, discussed risks of blister formation, small wound, skin dyspigmentation, or rare scar following cryotherapy. Recommend Vaseline ointment to treated areas while healing.  NEOPLASM OF UNCERTAIN BEHAVIOR (2) right lateral cheek Epidermal / dermal shaving  Lesion diameter (cm):  0.5 Informed consent: discussed and consent obtained   Patient was prepped and draped in usual sterile fashion: Area prepped with alcohol. Anesthesia: the lesion was anesthetized in a standard fashion   Anesthetic:  1% lidocaine w/ epinephrine 1-100,000 buffered w/ 8.4% NaHCO3 Instrument used: flexible razor blade   Hemostasis achieved with: pressure, aluminum chloride and electrodesiccation   Outcome: patient tolerated procedure well    Destruction of lesion  Destruction method: electrodesiccation and curettage   Informed consent: discussed and consent obtained   Curettage performed in three different directions: Yes   Electrodesiccation performed over the curetted area: Yes   Final wound size (cm):  0.6 Hemostasis achieved with:  pressure, aluminum chloride and electrodesiccation Outcome: patient tolerated procedure well with no complications   Post-procedure details: wound care instructions given   Additional details:  Mupirocin ointment and Bandaid applied  Specimen 1 - Surgical pathology Differential Diagnosis: r/o bcc  ED&C done today   Check Margins: No left zygoma Epidermal / dermal shaving  Lesion diameter (cm):  0.5 Informed consent: discussed and consent obtained   Patient was prepped and draped in usual sterile fashion: Area prepped with alcohol. Anesthesia: the lesion was anesthetized in a standard fashion    Anesthetic:  1% lidocaine w/ epinephrine 1-100,000 buffered w/ 8.4% NaHCO3 Instrument used: flexible razor blade   Hemostasis achieved with: pressure, aluminum chloride and electrodesiccation   Outcome: patient tolerated procedure well    Destruction of lesion  Destruction method: electrodesiccation and curettage   Informed consent: discussed and consent obtained   Curettage performed in three different directions: Yes   Electrodesiccation performed over the curetted area: Yes   Final wound size (cm):  0.8 Hemostasis achieved with:  pressure, aluminum chloride and electrodesiccation Outcome: patient tolerated procedure well with no complications   Post-procedure details: wound care instructions given   Additional details:  Mupirocin ointment and Bandaid applied  Specimen 2 - Surgical pathology Differential Diagnosis: r/o bcc ED&C done today   Check Margins: No R/o bcc ED&C done today both sites Return in about 1 year (around 12/25/2024) for TBSE.  I, Asher Muir, CMA, am acting as scribe for Willeen Niece, MD.   Documentation: I have reviewed the above documentation for accuracy and completeness, and I agree with the above.  Willeen Niece, MD

## 2023-12-26 NOTE — Patient Instructions (Addendum)
 Recommend sunscreen for scar  Actinic keratoses are precancerous spots that appear secondary to cumulative UV radiation exposure/sun exposure over time. They are chronic with expected duration over 1 year. A portion of actinic keratoses will progress to squamous cell carcinoma of the skin. It is not possible to reliably predict which spots will progress to skin cancer and so treatment is recommended to prevent development of skin cancer.  Recommend daily broad spectrum sunscreen SPF 30+ to sun-exposed areas, reapply every 2 hours as needed.  Recommend staying in the shade or wearing long sleeves, sun glasses (UVA+UVB protection) and wide brim hats (4-inch brim around the entire circumference of the hat). Call for new or changing lesions.   Cryotherapy Aftercare  Wash gently with soap and water everyday.   Apply Vaseline and Band-Aid daily until healed.    For itchy spots on back of left shoulder   Can use mometasone cream daily to twice daily as needed use up to 5 times per week Topical steroids (such as triamcinolone, fluocinolone, fluocinonide, mometasone, clobetasol, halobetasol, betamethasone, hydrocortisone) can cause thinning and lightening of the skin if they are used for too long in the same area. Your physician has selected the right strength medicine for your problem and area affected on the body. Please use your medication only as directed by your physician to prevent side effects.      Gentle Skin Care Guide  1. Bathe no more than once a day.  2. Avoid bathing in hot water  3. Use a mild soap like Dove, Vanicream, Cetaphil, CeraVe. Can use Lever 2000 or Cetaphil antibacterial soap  4. Use soap only where you need it. On most days, use it under your arms, between your legs, and on your feet. Let the water rinse other areas unless visibly dirty.  5. When you get out of the bath/shower, use a towel to gently blot your skin dry, don't rub it.  6. While your skin is still a  little damp, apply a moisturizing cream such as Vanicream, CeraVe, Cetaphil, Eucerin, Sarna lotion or plain Vaseline Jelly. For hands apply Neutrogena [place] Hand Cream or Excipial Hand Cream.  7. Reapply moisturizer any time you start to itch or feel dry.  8. Sometimes using free and clear laundry detergents can be helpful. Fabric softener sheets should be avoided. Downy Free & Gentle liquid, or any liquid fabric softener that is free of dyes and perfumes, it acceptable to use  9. If your doctor has given you prescription creams you may apply moisturizers over them     Biopsy Wound Care Instructions  Leave the original bandage on for 24 hours if possible.  If the bandage becomes soaked or soiled before that time, it is OK to remove it and examine the wound.  A small amount of post-operative bleeding is normal.  If excessive bleeding occurs, remove the bandage, place gauze over the site and apply continuous pressure (no peeking) over the area for 30 minutes. If this does not work, please call our clinic as soon as possible or page your doctor if it is after hours.   Once a day, cleanse the wound with soap and water. It is fine to shower. If a thick crust develops you may use a Q-tip dipped into dilute hydrogen peroxide (mix 1:1 with water) to dissolve it.  Hydrogen peroxide can slow the healing process, so use it only as needed.    After washing, apply petroleum jelly (Vaseline) or an antibiotic ointment if your  doctor prescribed one for you, followed by a bandage.    For best healing, the wound should be covered with a layer of ointment at all times. If you are not able to keep the area covered with a bandage to hold the ointment in place, this may mean re-applying the ointment several times a day.  Continue this wound care until the wound has healed and is no longer open.   Itching and mild discomfort is normal during the healing process. However, if you develop pain or severe itching,  please call our office.   If you have any discomfort, you can take Tylenol (acetaminophen) or ibuprofen as directed on the bottle. (Please do not take these if you have an allergy to them or cannot take them for another reason).  Some redness, tenderness and white or yellow material in the wound is normal healing.  If the area becomes very sore and red, or develops a thick yellow-green material (pus), it may be infected; please notify us.    If you have stitches, return to clinic as directed to have the stitches removed. You will continue wound care for 2-3 days after the stitches are removed.   Wound healing continues for up to one year following surgery. It is not unusual to experience pain in the scar from time to time during the interval.  If the pain becomes severe or the scar thickens, you should notify the office.    A slight amount of redness in a scar is expected for the first six months.  After six months, the redness will fade and the scar will soften and fade.  The color difference becomes less noticeable with time.  If there are any problems, return for a post-op surgery check at your earliest convenience.  To improve the appearance of the scar, you can use silicone scar gel, cream, or sheets (such as Mederma or Serica) every night for up to one year. These are available over the counter (without a prescription).  Please call our office at [phone] for any questions or concerns.    Electrodesiccation and Curettage ("Scrape and Burn") Wound Care Instructions  Leave the original bandage on for 24 hours if possible.  If the bandage becomes soaked or soiled before that time, it is OK to remove it and examine the wound.  A small amount of post-operative bleeding is normal.  If excessive bleeding occurs, remove the bandage, place gauze over the site and apply continuous pressure (no peeking) over the area for 30 minutes. If this does not work, please call our clinic as soon as  possible or page your doctor if it is after hours.   Once a day, cleanse the wound with soap and water. It is fine to shower. If a thick crust develops you may use a Q-tip dipped into dilute hydrogen peroxide (mix 1:1 with water) to dissolve it.  Hydrogen peroxide can slow the healing process, so use it only as needed.    After washing, apply petroleum jelly (Vaseline) or an antibiotic ointment if your doctor prescribed one for you, followed by a bandage.    For best healing, the wound should be covered with a layer of ointment at all times. If you are not able to keep the area covered with a bandage to hold the ointment in place, this may mean re-applying the ointment several times a day.  Continue this wound care until the wound has healed and is no longer open. It may take several  weeks for the wound to heal and close.  Itching and mild discomfort is normal during the healing process.  If you have any discomfort, you can take Tylenol (acetaminophen) or ibuprofen as directed on the bottle. (Please do not take these if you have an allergy to them or cannot take them for another reason).  Some redness, tenderness and white or yellow material in the wound is normal healing.  If the area becomes very sore and red, or develops a thick yellow-green material (pus), it may be infected; please notify us.    Wound healing continues for up to one year following surgery. It is not unusual to experience pain in the scar from time to time during the interval.  If the pain becomes severe or the scar thickens, you should notify the office.    A slight amount of redness in a scar is expected for the first six months.  After six months, the redness will fade and the scar will soften and fade.  The color difference becomes less noticeable with time.  If there are any problems, return for a post-op surgery check at your earliest convenience.  To improve the appearance of the scar, you can use silicone scar gel,  cream, or sheets (such as Mederma or Serica) every night for up to one year. These are available over the counter (without a prescription).  Please call our office at [phone] for any questions or concerns.  Melanoma ABCDEs  Melanoma is the most dangerous type of skin cancer, and is the leading cause of death from skin disease.  You are more likely to develop melanoma if you: Have light-colored skin, light-colored eyes, or red or blond hair Spend a lot of time in the sun Tan regularly, either outdoors or in a tanning bed Have had blistering sunburns, especially during childhood Have a close family member who has had a melanoma Have atypical moles or large birthmarks  Early detection of melanoma is key since treatment is typically straightforward and cure rates are extremely high if we catch it early.   The first sign of melanoma is often a change in a mole or a new dark spot.  The ABCDE system is a way of remembering the signs of melanoma.  A for asymmetry:  The two halves do not match. B for border:  The edges of the growth are irregular. C for color:  A mixture of colors are present instead of an even brown color. D for diameter:  Melanomas are usually (but not always) greater than 6mm - the size of a pencil eraser. E for evolution:  The spot keeps changing in size, shape, and color.  Please check your skin once per month between visits. You can use a small mirror in front and a large mirror behind you to keep an eye on the back side or your body.   If you see any new or changing lesions before your next follow-up, please call to schedule a visit.  Please continue daily skin protection including broad spectrum sunscreen SPF 30+ to sun-exposed areas, reapplying every 2 hours as needed when you're outdoors.   Staying in the shade or wearing long sleeves, sun glasses (UVA+UVB protection) and wide brim hats (4-inch brim around the entire circumference of the hat) are also recommended  for sun protection.    Due to recent changes in healthcare laws, you may see results of your pathology and/or laboratory studies on MyChart before the doctors have had a chance to  review them. We understand that in some cases there may be results that are confusing or concerning to you. Please understand that not all results are received at the same time and often the doctors may need to interpret multiple results in order to provide you with the best plan of care or course of treatment. Therefore, we ask that you please give [place] 2 business days to thoroughly review all your results before contacting the office for clarification. Should we see a critical lab result, you will be contacted sooner.   If You Need Anything After Your Visit  If you have any questions or concerns for your doctor, please call our main line at [phone] and press option 4 to reach your doctor's medical assistant. If no one answers, please leave a voicemail as directed and we will return your call as soon as possible. Messages left after 4 pm will be answered the following business day.   You may also send [place] a message via MyChart. We typically respond to MyChart messages within 1-2 business days.  For prescription refills, please ask your pharmacy to contact our office. Our fax number is [phone].  If you have an urgent issue when the clinic is closed that cannot wait until the next business day, you can page your doctor at the number below.    Please note that while we do our best to be available for urgent issues outside of office hours, we are not available 24/7.   If you have an urgent issue and are unable to reach [place], you may choose to seek medical care at your doctor's office, retail clinic, urgent care center, or emergency room.  If you have a medical emergency, please immediately call 911 or go to the emergency department.  Pager Numbers  - Dr. Gwen Pounds: [phone]  - Dr. Roseanne Reno: [phone]  -  Dr. Katrinka Blazing: [phone]   In the event of inclement weather, please call our main line at [phone] for an update on the status of any delays or closures.  Dermatology Medication Tips: Please keep the boxes that topical medications come in in order to help keep track of the instructions about where and how to use these. Pharmacies typically print the medication instructions only on the boxes and not directly on the medication tubes.   If your medication is too expensive, please contact our office at [phone] option 4 or send [place] a message through MyChart.   We are unable to tell what your co-pay for medications will be in advance as this is different depending on your insurance coverage. However, we may be able to find a substitute medication at lower cost or fill out paperwork to get insurance to cover a needed medication.   If a prior authorization is required to get your medication covered by your insurance company, please allow [place] 1-2 business days to complete this process.  Drug prices often vary depending on where the prescription is filled and some pharmacies may offer cheaper prices.  The website www.goodrx.com contains coupons for medications through different pharmacies. The prices here do not account for what the cost may be with help from insurance (it may be cheaper with your insurance), but the website can give you the price if you did not use any insurance.  - You can print the associated coupon and take it with your prescription to the pharmacy.  - You may also stop by our office during regular business hours and pick up a GoodRx coupon card.  -  If you need your prescription sent electronically to a different pharmacy, notify our office through [facility] or by phone at [phone] option 4.     Si Usted Necesita Algo Despus de Su Visita  Tambin puede enviarnos un mensaje a travs de Clinical cytogeneticist. Por lo general respondemos a los mensajes de MyChart en el  transcurso de 1 a 2 das hbiles.  Para renovar recetas, por favor pida a su farmacia que se ponga en contacto con nuestra oficina. Annie Sable de fax es [place] [phone].  Si tiene un asunto urgente cuando la clnica est cerrada y que no puede esperar hasta el siguiente da hbil, puede llamar/localizar a su doctor(a) al nmero que aparece a continuacin.   Por favor, tenga en cuenta que aunque hacemos todo lo posible para estar disponibles para asuntos urgentes fuera del horario de [place], no estamos disponibles las 24 horas del da, los 7 [address]  [address] de la [place].   Si tiene un problema urgente y no puede comunicarse con nosotros, puede optar por buscar atencin mdica  en el consultorio de su doctor(a), en una clnica privada, en un centro de atencin urgente o en una sala de emergencias.  Si tiene Engineer, drilling, por favor llame inmediatamente al 911 o vaya a la sala de emergencias.  Nmeros de bper  - Dr. Gwen Pounds: [phone]  - Dra. Roseanne Reno: [phone]  - Dr. Katrinka Blazing: [phone]   En caso de inclemencias del tiempo, por favor llame a Lacy Duverney principal al [phone] para una actualizacin sobre el [place] de cualquier retraso o cierre.  Consejos para la medicacin en dermatologa: Por favor, guarde las cajas en las que vienen los medicamentos de uso tpico para ayudarle a seguir las instrucciones sobre dnde y cmo usarlos. Las farmacias generalmente imprimen las instrucciones del medicamento slo en las cajas y no directamente en los tubos del [place].   Si su medicamento es muy caro, por favor, pngase en contacto con Rolm Gala llamando al [phone] y presione la opcin 4 o envenos un mensaje a travs de Clinical cytogeneticist.   No podemos decirle cul ser su copago por los medicamentos por adelantado ya que esto es diferente dependiendo de la cobertura de su seguro. Sin embargo, es posible que podamos encontrar un medicamento sustituto a Audiological scientist un  formulario para que el seguro cubra el medicamento que se considera necesario.   Si se requiere una autorizacin previa para que su compaa de seguros [place] su medicamento, por favor permtanos de 1 a 2 das hbiles para completar [address]  Los precios de los medicamentos varan con frecuencia dependiendo del Environmental consultant de dnde se surte la receta y alguna farmacias pueden ofrecer precios ms baratos.  El sitio web www.goodrx.com tiene cupones para medicamentos de Health and safety inspector. Los precios aqu no tienen en cuenta lo que podra costar con la ayuda del seguro (puede ser ms barato con su seguro), pero el sitio web puede darle el precio si no utiliz Tourist information centre manager.  - Puede imprimir el cupn correspondiente y llevarlo con su receta a la farmacia.  - Tambin puede pasar por nuestra oficina durante el horario de atencin regular y Education officer, museum una tarjeta de cupones de GoodRx.  - Si necesita que su receta se enve electrnicamente a una farmacia diferente, informe a nuestra oficina a travs de MyChart de [place] o por telfono llamando al [phone] y presione la opcin 4.

## 2023-12-30 ENCOUNTER — Telehealth: Payer: Self-pay

## 2023-12-30 LAB — SURGICAL PATHOLOGY

## 2023-12-30 NOTE — Telephone Encounter (Signed)
 Advised pt of bx results/sh ?

## 2023-12-30 NOTE — Telephone Encounter (Signed)
-----   Message from Willeen Niece sent at 12/30/2023  5:15 PM EDT ----- 1. 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 Skin, left zygoma :       ACTINIC KERATOSIS WITH SUPERIMPOSED SPONGIOTIC DERMATITIS, ULCERATED      1. and 2. are both precancers with associated inflammation of skin (dermatitis), both already treated with EDC at time of biopsy - please call patient

## 2024-01-15 ENCOUNTER — Other Ambulatory Visit: Payer: Self-pay | Admitting: Family Medicine

## 2024-01-16 NOTE — Telephone Encounter (Signed)
 OV needed for additional refills.  Requested Prescriptions  Pending Prescriptions Disp Refills   finasteride (PROSCAR) 5 MG tablet [Pharmacy Med Name: FINASTERIDE 5 MG TABLET] 90 tablet 3    Sig: TAKE 1 TABLET (5 MG TOTAL) BY MOUTH DAILY.     Urology: 5-alpha Reductase Inhibitors Failed - 01/16/2024  8:53 AM      Failed - PSA in normal range and within 360 days    PSA  Date Value Ref Range Status  12/19/2022 0.50 < OR = 4.00 ng/mL Final    Comment:    The total PSA value from this assay system is  standardized against the WHO standard. The test  result will be approximately 20% lower when compared  to the equimolar-standardized total PSA (Beckman  Coulter). Comparison of serial PSA results should be  interpreted with this fact in mind. . This test was performed using the Siemens  chemiluminescent method. Values obtained from  different assay methods cannot be used interchangeably. PSA levels, regardless of value, should not be interpreted as absolute evidence of the presence or absence of disease.          Failed - Valid encounter within last 12 months    Recent Outpatient Visits           1 year ago Fatigue, unspecified type   [place] [facility] Pickard, Cisco Crest, MD       Future Appointments             In 2 weeks Millicent Ally, MD [facility] at [facility]   In 11 months Artemio Daiki, MD [facility]

## 2024-01-20 ENCOUNTER — Other Ambulatory Visit: Payer: Self-pay | Admitting: Family Medicine

## 2024-01-21 NOTE — Telephone Encounter (Signed)
 Requested Prescriptions  Pending Prescriptions Disp Refills   metoprolol  tartrate (LOPRESSOR ) 25 MG tablet [Pharmacy Med Name: METOPROLOL  TARTRATE 25 MG TAB] 180 tablet 0    Sig: TAKE 1 TABLET BY MOUTH TWICE A DAY     Cardiovascular:  Beta Blockers Failed - 01/21/2024 12:22 PM      Failed - Valid encounter within last 6 months    Recent Outpatient Visits           1 year ago Fatigue, unspecified type   [facility] Pickard, Cisco Crest, MD       Future Appointments             In 2 weeks Millicent Ally, MD [facility] at [facility]   In 11 months Artemio Jonn, MD [facility]            Passed - Last BP in normal range    BP Readings from Last 1 Encounters:  06/08/23 137/73         Passed - Last Heart Rate in normal range    Pulse Readings from Last 1 Encounters:  06/08/23 69

## 2024-01-22 ENCOUNTER — Other Ambulatory Visit: Payer: Self-pay | Admitting: Family Medicine

## 2024-01-22 DIAGNOSIS — I10 Essential (primary) hypertension: Secondary | ICD-10-CM

## 2024-01-22 DIAGNOSIS — N529 Male erectile dysfunction, unspecified: Secondary | ICD-10-CM

## 2024-01-22 DIAGNOSIS — N400 Enlarged prostate without lower urinary tract symptoms: Secondary | ICD-10-CM

## 2024-01-23 NOTE — Telephone Encounter (Signed)
 Requested medication (s) are due for refill today: Yes  Requested medication (s) are on the active medication list: Yes  Last refill:  10/24/23  Future visit scheduled: No  Notes to clinic:  Protocol indicates OV needed.    Requested Prescriptions  Pending Prescriptions Disp Refills   tadalafil  (CIALIS ) 5 MG tablet [Pharmacy Med Name: TADALAFIL  5 MG TAB (GEN CIALIS )] 90 tablet 1    Sig: TAKE ONE TABLET BY MOUTH ONE TIME DAILY     Urology: Erectile Dysfunction Agents Failed - 01/23/2024  3:09 PM      Failed - AST in normal range and within 360 days    AST  Date Value Ref Range Status  12/19/2022 23 10 - 35 U/L Final         Failed - ALT in normal range and within 360 days    ALT  Date Value Ref Range Status  12/19/2022 14 9 - 46 U/L Final         Failed - Valid encounter within last 12 months    Recent Outpatient Visits           1 year ago Fatigue, unspecified type   [place] [facility] Pickard, Cisco Crest, MD       Future Appointments             In 1 week Millicent Ally, MD [facility] at [facility]   In 11 months Artemio Markon, MD [facility]            Passed - Last BP in normal range    BP Readings from Last 1 Encounters:  06/08/23 137/73

## 2024-01-24 ENCOUNTER — Telehealth: Payer: Self-pay

## 2024-01-24 ENCOUNTER — Telehealth: Payer: Self-pay | Admitting: Family Medicine

## 2024-01-24 ENCOUNTER — Other Ambulatory Visit: Payer: Self-pay | Admitting: Family Medicine

## 2024-01-24 DIAGNOSIS — Z0000 Encounter for general adult medical examination without abnormal findings: Secondary | ICD-10-CM

## 2024-01-24 DIAGNOSIS — I10 Essential (primary) hypertension: Secondary | ICD-10-CM

## 2024-01-24 DIAGNOSIS — N529 Male erectile dysfunction, unspecified: Secondary | ICD-10-CM

## 2024-01-24 DIAGNOSIS — N400 Enlarged prostate without lower urinary tract symptoms: Secondary | ICD-10-CM

## 2024-01-24 MED ORDER — ATORVASTATIN CALCIUM 80 MG PO TABS
80.0000 mg | ORAL_TABLET | Freq: Every day | ORAL | 3 refills | Status: AC
Start: 2024-01-24 — End: ?

## 2024-01-24 MED ORDER — MOMETASONE FUROATE 0.1 % EX CREA: 1.0000 | TOPICAL_CREAM | Freq: Every day | CUTANEOUS | 3 refills | Status: AC

## 2024-01-24 MED ORDER — FINASTERIDE 5 MG PO TABS: 5.0000 mg | ORAL_TABLET | Freq: Every day | ORAL | 3 refills | Status: DC

## 2024-01-24 MED ORDER — AMLODIPINE BESYLATE 2.5 MG PO TABS: 2.5000 mg | ORAL_TABLET | Freq: Every evening | ORAL | 3 refills | Status: AC

## 2024-01-24 MED ORDER — HYDROCHLOROTHIAZIDE 12.5 MG PO CAPS: 12.5000 mg | ORAL_CAPSULE | Freq: Every day | ORAL | 3 refills | Status: AC | PRN

## 2024-01-24 MED ORDER — PANTOPRAZOLE SODIUM 40 MG PO TBEC: 40.0000 mg | DELAYED_RELEASE_TABLET | Freq: Two times a day (BID) | ORAL | 2 refills | Status: AC

## 2024-01-24 MED ORDER — METOPROLOL TARTRATE 25 MG PO TABS
25.0000 mg | ORAL_TABLET | Freq: Two times a day (BID) | ORAL | 0 refills | Status: DC
Start: 2024-01-24 — End: 2024-07-13

## 2024-01-24 MED ORDER — TADALAFIL 5 MG PO TABS: 5.0000 mg | ORAL_TABLET | Freq: Every day | ORAL | 1 refills | Status: DC

## 2024-01-24 NOTE — Telephone Encounter (Signed)
 Requested medication (s) are due for refill today:   Yes  Requested medication (s) are on the active medication list:   Yes  Future visit scheduled:   Yes. 03/12/2024   LOV 12/25/2022    Last ordered: 06/28/2023 #90, 1 refill  Unable to refill because labs and OV are due.   His appt isn't until June.  Provider to review for refills prior to appt.     Requested Prescriptions  Pending Prescriptions Disp Refills   tadalafil  (CIALIS ) 5 MG tablet 90 tablet 1    Sig: Take 1 tablet (5 mg total) by mouth daily.     Urology: Erectile Dysfunction Agents Failed - 01/24/2024  3:26 PM      Failed - AST in normal range and within 360 days    AST  Date Value Ref Range Status  12/19/2022 23 10 - 35 U/L Final         Failed - ALT in normal range and within 360 days    ALT  Date Value Ref Range Status  12/19/2022 14 9 - 46 U/L Final         Failed - Valid encounter within last 12 months    Recent Outpatient Visits           1 year ago Fatigue, unspecified type   [facility] Pickard, Cisco Crest, MD       Future Appointments             In 1 week Millicent Ally, MD [facility] at [facility] [place]   In 11 months Artemio Denym, MD [facility] [place] [facility]            Passed - Last BP in normal range    BP Readings from Last 1 Encounters:  06/08/23 137/73

## 2024-01-24 NOTE — Telephone Encounter (Signed)
 Copied from CRM [id number]. Topic: Clinical - Medication Refill >> Jan 24, 2024  9:29 AM Leory Rands wrote: Most Recent Primary Care Visit:  Provider: Eliane Grooms T  [facility]  Visit Type: PHYSICAL  Date: 12/25/2022  Medication: tadalafil  (CIALIS ) 5 MG tablet [[id number]]  Has the patient contacted their pharmacy? Yes (Agent: If no, request that the patient contact the pharmacy for the refill. If patient does not wish to contact the pharmacy document the reason why and proceed with request.) (Agent: If yes, when and what did the pharmacy advise?)  Is this the correct pharmacy for this prescription? Yes If no, delete pharmacy and type the correct one.  This is the patient's preferred pharmacy:   [facility] [address] [place] - [place], [place] - [address] [facility] [address] [facility] [address] [place] [address] Phone: [phone] Fax: [phone]   Has the prescription been filled recently? Yes  Is the patient out of the medication? Yes  Has the patient been seen for an appointment in the last year OR does the patient have an upcoming appointment? Yes  Can we respond through MyChart? Yes  Agent: Please be advised that Rx refills may take up to 3 business days. We ask that you follow-up with your pharmacy.

## 2024-01-27 ENCOUNTER — Telehealth: Payer: Self-pay

## 2024-01-27 DIAGNOSIS — Z79899 Other long term (current) drug therapy: Secondary | ICD-10-CM

## 2024-01-27 NOTE — Telephone Encounter (Signed)
 Wife would like to know if patient needs any labs before appointment on 5/6.

## 2024-01-31 ENCOUNTER — Other Ambulatory Visit: Payer: Self-pay

## 2024-01-31 DIAGNOSIS — Z79899 Other long term (current) drug therapy: Secondary | ICD-10-CM | POA: Diagnosis not present

## 2024-01-31 LAB — CBC

## 2024-02-01 LAB — LIPID PANEL
Chol/HDL Ratio: 2.6 ratio (ref 0.0–5.0)
Cholesterol, Total: 86 mg/dL — ABNORMAL LOW (ref 100–199)
HDL: 33 mg/dL — ABNORMAL LOW (ref >39)
LDL Chol Calc (NIH): 38 mg/dL (ref 0–99)
Triglycerides: 65 mg/dL (ref 0–149)
VLDL Cholesterol Cal: 15 mg/dL (ref 5–40)

## 2024-02-01 LAB — COMPREHENSIVE METABOLIC PANEL WITH GFR
ALT: 13 IU/L (ref 0–44)
AST: 20 IU/L (ref 0–40)
Albumin: 3.8 g/dL (ref 3.8–4.8)
Alkaline Phosphatase: 122 IU/L — ABNORMAL HIGH (ref 44–121)
BUN/Creatinine Ratio: 13 (ref 10–24)
BUN: 13 mg/dL (ref 8–27)
Bilirubin Total: 0.5 mg/dL (ref 0.0–1.2)
CO2: 23 mmol/L (ref 20–29)
Calcium: 8.9 mg/dL (ref 8.6–10.2)
Chloride: 106 mmol/L (ref 96–106)
Creatinine, Ser: 1.02 mg/dL (ref 0.76–1.27)
Globulin, Total: 2.5 g/dL (ref 1.5–4.5)
Glucose: 84 mg/dL (ref 70–99)
Potassium: 3.7 mmol/L (ref 3.5–5.2)
Sodium: 144 mmol/L (ref 134–144)
Total Protein: 6.3 g/dL (ref 6.0–8.5)
eGFR: 76 mL/min/{1.73_m2} (ref >59)

## 2024-02-01 LAB — CBC
Hematocrit: 39.9 % (ref 37.5–51.0)
Hemoglobin: 13.1 g/dL (ref 13.0–17.7)
MCH: 30.3 pg (ref 26.6–33.0)
MCHC: 32.8 g/dL (ref 31.5–35.7)
MCV: 92 fL (ref 79–97)
Platelets: 217 10*3/uL (ref 150–450)
RBC: 4.33 x10E6/uL (ref 4.14–5.80)
RDW: 13.1 % (ref 11.6–15.4)
WBC: 7.7 10*3/uL (ref 3.4–10.8)

## 2024-02-01 LAB — TSH: TSH: 3.58 u[IU]/mL (ref 0.450–4.500)

## 2024-02-03 ENCOUNTER — Encounter (HOSPITAL_BASED_OUTPATIENT_CLINIC_OR_DEPARTMENT_OTHER): Payer: Self-pay

## 2024-02-04 ENCOUNTER — Ambulatory Visit
Payer: No Typology Code available for payment source | Attending: Cardiovascular Disease | Admitting: Cardiovascular Disease

## 2024-02-04 ENCOUNTER — Encounter: Payer: Self-pay | Admitting: Cardiovascular Disease

## 2024-02-04 VITALS — BP 128/60 | HR 57 | Ht 75.0 in | Wt 228.2 lb

## 2024-02-04 DIAGNOSIS — G4733 Obstructive sleep apnea (adult) (pediatric): Secondary | ICD-10-CM | POA: Insufficient documentation

## 2024-02-04 DIAGNOSIS — I10 Essential (primary) hypertension: Secondary | ICD-10-CM | POA: Insufficient documentation

## 2024-02-04 DIAGNOSIS — I2510 Atherosclerotic heart disease of native coronary artery without angina pectoris: Secondary | ICD-10-CM | POA: Diagnosis not present

## 2024-02-04 DIAGNOSIS — K219 Gastro-esophageal reflux disease without esophagitis: Secondary | ICD-10-CM | POA: Diagnosis not present

## 2024-02-04 DIAGNOSIS — E785 Hyperlipidemia, unspecified: Secondary | ICD-10-CM | POA: Insufficient documentation

## 2024-02-04 NOTE — Patient Instructions (Addendum)
 Medication Instructions:  NO CHANGES *If you need a refill on your cardiac medications before your next appointment, please call your pharmacy*  Lab Work: NO LABS If you have labs (blood work) drawn today and your tests are completely normal, you will receive your results only by: MyChart Message (if you have MyChart) OR A paper copy in the mail If you have any lab test that is abnormal or we need to change your treatment, we will call you to review the results.  Testing/Procedures: NO TESTING  Follow-Up: At [facility], you and your health needs are our priority.  As part of our continuing mission to provide you with exceptional heart care, our providers are all part of one team.  This team includes your primary Cardiologist (physician) and Advanced Practice Providers or APPs (Physician Assistants and Nurse Practitioners) who all work together to provide you with the care you need, when you need it.  Your next appointment:   6-8 month(s)  Provider:   Randene Bustard, MD and then 4 months with Gaylyn Keas, MD   once new machine is received.

## 2024-02-04 NOTE — Addendum Note (Signed)
 Addended by: Magnus Schuller A on: 02/04/2024 05:32 PM   Modules accepted: Level of Service

## 2024-02-04 NOTE — Progress Notes (Addendum)
 Primary MD: Dr. Eliane Suarez  PATIENT PROFILE: Gregory Suarez is a 78 y.o. male who presents for a 13 month follow-up evaluaiton.  HPI:  Gregory Suarez is a former patient of Dr. Vicenta Suarez.  He underwent heart catheterization over 20 ago and was told of having mild blockage which medical therapy was recommended. He has a long history of sleep apnea and initially was followed by Dr. Roma Suarez.  He on his third CPAP machine.  He has a istory of significant obesity when I last saw him in 2017 he had lost over 50 pounds   He denied any exertional chest pain but admits to intermittent left sharp, achy discomfort which is nonexertional.  He denied associated palpitations or shortness of breath.  He admitted to leg swelling and wore an ankle brace..  When I last saw him, he was stable from a cardiac standpoint.  An echo Doppler study in December 2017 showed an EF of 55 to 60%.  Over the past several years, he had done well with reference to chest pain or palpitations.  I  saw him in February 2020.  He had undergone a recent chest CT which showed emphysema, aortic atherosclerosis, as well as extensive coronary calcifications.  He has not been using his CPAP therapy and I obtained a download  in the office which showed only 2 days of use over the last 90 days.  He also has a history of restless leg syndrome.  He typically goes to bed between 9 and 11 PM and wakes up between 7 and 9 AM.  He had see Dr. Cheril Suarez and because of his abnormal CT scan was sent to me in February for follow-up evaluation.  At that time, in light of his significant coronary calcification I recommended that he undergo a coronary CT angiogram.  This was significantly abnormal and revealed a markedly elevated calcium  score of 2264 with diffuse coronary calcification and stenoses in the proximal LAD, diagonal, circumflex marginal, and RCA.    As a result he was referred for cardiac catheterization which I performed on Feb 13 2019.  He was  found to have diffuse multivessel coronary calcification with 50 to 60% proximal LAD stenoses, diffuse 50% stenosis in the first diagonal branch of the LAD with mild irregularity in the mid distal LAD without high-grade stenosis.  Left circumflex vessel had smooth 60% mid distal OM stenosis in the RCA had mild irregularity with proximal to mid calcification and a dominant RCA with stenosis of 20%.  He had preserved LV contractility with an EF at 55%. Medical therapy was recommended.  He had been started on low-dose beta-blocker therapy prior to the catheterization and subsequent to the procedure I recommended initiation of amlodipine  5 mg and further titration of atorvastatin  to 80 mg.  I saw him in August 2020.  At that time he continued to feel well and denied any recurrent chest pain, palpitations or shortness of breath.  His heart rate was running in the mid 50s.  He had noticed leg edema.  He has been 100% compliant with BiPAP therapy.  A download was obtained from July 6 through May 05, 2019.  This shows 100% use and he is averaging 8 hours and 56 minutes of BiPAP therapy per night.  At an 18/14 pressure, AHI is excellent at 1.6.  There is no leak.  He continues to be on atorvastatin  80 mg.  LDL cholesterol on April 22, 2019 was excellent at 65 with  total cholesterol 102 HDL 43 triglyceride 76.    He was hospitalized in June 2021 with a collapsed lung.  He ultimately underwent talc  pleurodesis during his hospitalization.  He has been seen by Gregory Heath, PA and floor his lower extremity edema he was given several days of Lasix  with benefit.  I saw him in November 21, 2020 at which time he was doing well.  He has arthritic issues involving his knees and feet with some discomfort with walking.  He is unaware of palpitations.  He denies recurrent anginal symptomatology.  He has continued to be on amlodipine  2.5 mg, furosemide  20 mg, metoprolol  tartrate 25 mg twice a day for blood pressure.  He is on  atorvastatin  80 mg for hyperlipidemia.  Recent laboratory on November 14, 2020 showed an LDL cholesterol of 53.  He continues to use BiPAP.  I obtained a new download from January 20 through November 18, 2020 which confirms excellent compliance with average use at 9 hours and 40 minutes.  At an 18/14 pressure AHI is excellent at 1.0.    When I saw him on December 11, 2021 he was having  arthritic issues and has had knees and shoulders injections.  He is followed by Dr. Cheril Suarez who will be checking complete set of laboratory.  He is on amlodipine  2.5 mg, metoprolol  tartrate 25 mg twice a day, and hydrochlorothiazide  12.5 mg as needed for blood pressure and edema.  He has a history of restless leg syndrome and takes Mirapex  at bedtime.  He continues to use CPAP and a download was obtained from February 12 through December 11, 2021 evealed excellent compliance.  Average sleep was over 10 hours per night.  His BiPAP is set at a 18/14 pressure.  There is no leak.  AHI is excellent at 2.1.    I last saw him on January 08, 2019 for at which time he denied he any chest pain or shortness of breath.  He continues to be followed by Dr. Cheril Suarez.  He has experienced some mild ankle edema.  He has been having difficulty with his right shoulder and may require reverse shoulder replacement to be done by Dr. Sedonia Suarez in the near future.  He has had difficulty falling asleep.  He continues to use his ResMed air care of 10 via auto BiPAP unit with set up date May 13, 2018.  A download was obtained from March 10 through January 07, 2023.  Compliance is suboptimal with average use on days used at 4 hours and 25 minutes.  His device is set on a BiPAP pressure of 18/14.  AHI is 2.4.  He continues to be on amlodipine  2.5 mg, HCTZ 12.5 mg as needed, and metoprolol  tartrate 25 mg twice a day for his CAD and hypertension.  He is on carbidopa /levodopa  for restless leg syndrome.  He is on atorvastatin  80 mg daily for hyperlipidemia with most recent LDL  cholesterol on December 19, 2022 excellent at 47.  He presented for cardiology evaluation and preoperative clearance prior to right reverse shoulder replacement surgery.  Since I last saw him, he underwent successful right reverse shoulder replacement surgery by Dr. Winston Hawking in September 2024.  He had been evaluated by Dr. Bernetta Brilliant of neurology in [place] for his lymphedema and has been undergoing sequential compression device to both legs 2 times per day with improvement in his lymphedema.  He denies any chest pain or significant shortness of breath.  He continues to use his BiPAP device  and is in need for new machine.  His current air curve 10 the auto unit set up date was May 13, 2018.  Apparently he had been set at a minimum EPAP pressure of 14 with maximum IPAP pressure of 18 pressure support of 4 which essentially was a set pressure.  He admits to excellent compliance.  He continues to be on amlodipine  2.5 mg and metoprolol  tartrate 25 mg twice a day for hypertension and has a prescription for HCTZ 12.5 mg daily which he takes only as needed.  He is on atorvastatin  80 mg for hyperlipidemia.  He takes pantoprazole  for GERD.  He is on aspirin , and takes finasteride .  He presents for evaluation.   Past Medical History:  Diagnosis Date   Actinic keratosis 12/26/2023   L zygoma, [facility]   AK (actinic keratosis) 12/26/2023   right lateral cheek, [facility]   Anxiety    Arthritis    Basal cell carcinoma 06/28/2015   left temple - excised   Basal cell carcinoma 08/05/2017   right cheek inf to zygoma   Basal cell carcinoma 05/09/2020   right prox mandible   CAD (coronary artery disease)    COPD (chronic obstructive pulmonary disease) (HCC)    Depression    Elevated PSA    Erectile dysfunction    GERD (gastroesophageal reflux disease)    Hyperlipidemia    Mitral valve regurgitation    OSA treated with BiPAP    Pneumonia    Pneumothorax    ultimatley required talc  pleurodiesis (2021)   PONV  (postoperative nausea and vomiting)    Post traumatic stress disorder (PTSD)    RLS (restless legs syndrome)    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   right temple : sccis - shave   Tubular adenoma of colon     Past Surgical History:  Procedure Laterality Date   ANKLE SURGERY     BIOPSY  05/08/2022   Procedure: BIOPSY;  Surgeon: Urban Garden, MD;  Location: [facility];  Service: Gastroenterology;;   CHOLECYSTECTOMY     CHOLECYSTECTOMY     COLONOSCOPY WITH PROPOFOL  N/A 05/08/2022   Procedure: COLONOSCOPY WITH PROPOFOL ;  Surgeon: Urban Garden, MD;  Location: [facility];  Service: Gastroenterology;  Laterality: N/A;  730 ASA 2   ESOPHAGOGASTRODUODENOSCOPY (EGD) WITH PROPOFOL  N/A 05/08/2022   Procedure: ESOPHAGOGASTRODUODENOSCOPY (EGD) WITH PROPOFOL ;  Surgeon: Urban Garden, MD;  Location: [facility];  Service: Gastroenterology;  Laterality: N/A;   hydrocele surgery      LEFT HEART CATH AND CORONARY ANGIOGRAPHY N/A 02/13/2019   Procedure: LEFT HEART CATH AND CORONARY ANGIOGRAPHY;  Surgeon: Millicent Ally, MD;  Location: [facility];  Service: Cardiovascular;  Laterality: N/A;   MOHS SURGERY     POLYPECTOMY  05/08/2022   Procedure: POLYPECTOMY;  Surgeon: Urban Garden, MD;  Location: [facility];  Service: Gastroenterology;;   REVERSE SHOULDER ARTHROPLASTY Right 06/07/2023   Procedure: REVERSE SHOULDER ARTHROPLASTY;  Surgeon: Winston Hawking, MD;  Location: [facility];  Service: Orthopedics;  Laterality: Right;    Allergies  Allergen Reactions   Tape     Pt allergic to tape used for CT insertion    Crestor [Rosuvastatin] Rash and Other (See Comments)    "Did not feel well"     Current Outpatient Medications  Medication Sig Dispense Refill   acetaminophen  (TYLENOL ) 500 MG tablet Take 500-1,000 mg by mouth every 6 (six) hours as needed for moderate pain.  Alpha-D-Galactosidase (BEANO) TABS Take 3 tablets by mouth  at bedtime.     amLODipine  (NORVASC ) 2.5 MG tablet Take 1 tablet (2.5 mg total) by mouth every evening. 90 tablet 3   aspirin  EC 81 MG tablet Take 1 tablet (81 mg total) by mouth daily. 90 tablet 3   atorvastatin  (LIPITOR ) 80 MG tablet Take 1 tablet (80 mg total) by mouth daily. 90 tablet 3   benzonatate  (TESSALON ) 100 MG capsule Take 100 mg by mouth 3 (three) times daily as needed for cough.     chlorpheniramine (CHLOR-TRIMETON) 4 MG tablet Take 8 mg by mouth at bedtime.     cyanocobalamin  (VITAMIN B12) 1000 MCG/ML injection Inject 1,000 mcg into the muscle every 30 (thirty) days.     finasteride  (PROSCAR ) 5 MG tablet Take 1 tablet (5 mg total) by mouth daily. 90 tablet 3   hydrochlorothiazide  (MICROZIDE ) 12.5 MG capsule Take 1 capsule (12.5 mg total) by mouth daily as needed (fluid/swelling). 90 capsule 3   hydrocortisone  2.5 % cream Apply topically as directed. Apply to scaly rash on face hs on Tuesday, Thursday, and Saturday 30 g 6   ketoconazole  (NIZORAL ) 2 % cream Apply 1 application. topically daily. Qhs to feet for 1 month, Apply to scaly areas on face 3 times weekly, Monday, Wednesday and Friday (Patient taking differently: Apply 1 application  topically every Monday, Wednesday, and Friday. Qhs to feet for 1 month, Apply to scaly areas on face 3 times weekly, Monday, Wednesday and Friday) 60 g 11   meclizine  (ANTIVERT ) 25 MG tablet Take 25 mg by mouth 3 (three) times daily as needed for dizziness.     Melatonin 5 MG TABS Take 10 mg by mouth at bedtime.      metoprolol  tartrate (LOPRESSOR ) 25 MG tablet Take 1 tablet (25 mg total) by mouth 2 (two) times daily. 180 tablet 0   mometasone  (ELOCON ) 0.1 % cream Apply 1 Application topically daily. Apply to affected area of back up to 5 days per week prn 45 g 3   pantoprazole  (PROTONIX ) 40 MG tablet Take 1 tablet (40 mg total) by mouth 2 (two) times daily. 180 tablet 2   tadalafil  (CIALIS ) 5 MG tablet Take 1 tablet (5 mg total) by mouth daily. 90  tablet 1   terbinafine  (LAMISIL ) 250 MG tablet Take 1 tablet (250 mg total) by mouth daily. 30 tablet 1   tiZANidine  (ZANAFLEX ) 4 MG tablet Take 1 tablet (4 mg total) by mouth every 8 (eight) hours as needed for muscle spasms. 20 tablet 0   traMADol  (ULTRAM ) 50 MG tablet Take 1 tablet (50 mg total) by mouth every 6 (six) hours as needed. 20 tablet 0   valACYclovir  (VALTREX ) 1000 MG tablet Take 1,000 mg by mouth daily as needed (outbreak).     VITAMIN D -VITAMIN K  PO Take 1 tablet by mouth daily.     carbidopa -levodopa  (SINEMET  CR) 50-200 MG tablet Take 1 tablet by mouth at bedtime. (Patient not taking: Reported on 02/04/2024)     No current facility-administered medications for this visit.    Social History   Socioeconomic History   Marital status: Married    Spouse name: Not on file   Number of children: Not on file   Years of education: Not on file   Highest education level: Not on file  Occupational History   Occupation: RETIRED  Tobacco Use   Smoking status: Former    Current packs/day: 0.00    Average packs/day: 0.5 packs/day  for 15.0 years (7.5 ttl pk-yrs)    Types: Cigarettes    Start date: 10/01/1973    Quit date: 10/01/1988    Years since quitting: 35.3    Passive exposure: Past   Smokeless tobacco: Never  Vaping Use   Vaping status: Never Used  Substance and Sexual Activity   Alcohol use: No    Alcohol/week: 0.0 standard drinks of alcohol   Drug use: No   Sexual activity: Yes    Comment: married, 2 grown sons, retired maintenance man  Other Topics Concern   Not on file  Social History Narrative   Not on file   Social Drivers of Health   Financial Resource Strain: Low Risk  (05/13/2020)   Overall Financial Resource Strain (CARDIA)    Difficulty of Paying Living Expenses: Not very hard  Food Insecurity: No Food Insecurity (06/07/2023)   Hunger Vital Sign    Worried About Running Out of Food in the Last Year: Never true    Ran Out of Food in the Last Year: Never true   Transportation Needs: No Transportation Needs (06/07/2023)   PRAPARE - Administrator, Civil Service (Medical): No    Lack of Transportation (Non-Medical): No  Physical Activity: Not on file  Stress: Not on file  Social Connections: Not on file  Intimate Partner Violence: Not At Risk (06/07/2023)   Humiliation, Afraid, Rape, and Kick questionnaire    Fear of Current or Ex-Partner: No    Emotionally Abused: No    Physically Abused: No    Sexually Abused: No   Additional social history is notable that he is married for 45 years.  He has 2 children and 4 grandchildren.  He previously worked as a Data processing manager.  He remotely smoked but quit in 1990.  He is not routinely exercise.  Family History  Problem Relation Age of Onset   Diabetes Other    Breast cancer Other    Coronary artery disease Other    Family history is notable that his mother died at 73 with cancer.  Father had heart disease and diabetes mellitus.  He has 3 brothers, one had heart valve replacement in 2017 and another had undergone CABG revascularization surgery.  He has 2 living sisters and one sister died in an accident.  ROS General: Negative; No fevers, chills, or night sweats HEENT: Negative; No changes in vision or hearing, sinus congestion, difficulty swallowing Pulmonary: Negative; No cough, wheezing, shortness of breath, hemoptysis Cardiovascular:  See HPI; No chest pain, presyncope, syncope, palpitations, edema GI: Negative; No nausea, vomiting, diarrhea, or abdominal pain GU: Negative; No dysuria, hematuria, or difficulty voiding Musculoskeletal: Arthritic issues, knees/shoulders Hematologic/Oncologic: Negative; no easy bruising, bleeding Endocrine: Negative; no heat/cold intolerance; no diabetes Neuro: Negative; no changes in balance, headaches Skin: Negative; No rashes or skin lesions Psychiatric: Negative; No behavioral problems, depression Sleep: Positive for obstructive sleep apnea on  his third BiPAP machine.  No daytime sleepiness, hypersomnolence, bruxism, restless legs, hypnogagnic hallucinations Other comprehensive 14 point system review is negative   Physical Exam BP 128/60 (BP Location: Right Arm, Patient Position: Sitting, Cuff Size: Normal)   Pulse (!) 57   Ht 6\' 3"  (1.905 m)   Wt 228 lb 3.2 oz (103.5 kg)   SpO2 95%   BMI 28.52 kg/m    Repeat blood pressure by me was 130/68  Wt Readings from Last 3 Encounters:  02/04/24 228 lb 3.2 oz (103.5 kg)  06/07/23 225 lb 15.5 oz (102.5 kg)  05/28/23 226 lb (102.5 kg)   General: Alert, oriented, no distress.  Skin: normal turgor, no rashes, warm and dry HEENT: Normocephalic, atraumatic. Pupils equal round and reactive to light; sclera anicteric; extraocular muscles intact;  Nose without nasal septal hypertrophy Mouth/Parynx benign; Mallinpatti scale 3 Neck: No JVD, no carotid bruits; normal carotid upstroke Lungs: clear to ausculatation and percussion; no wheezing or rales Chest wall: without tenderness to palpitation Heart: PMI not displaced, RRR, s1 s2 normal, 1/6 systolic murmur, no diastolic murmur, no rubs, gallops, thrills, or heaves Abdomen: soft, nontender; no hepatosplenomehaly, BS+; abdominal aorta nontender and not dilated by palpation. Back: no CVA tenderness Pulses 2+ Musculoskeletal: full range of motion, normal strength, no joint deformities Extremities: Mild lymphedema with compression stockings for which he undergo sequential compression devices to both legs twice a day no clubbing cyanosis, Homan's sign negative  Neurologic: grossly nonfocal; Cranial nerves grossly wnl Psychologic: Normal mood and affect    EKG Interpretation Date/Time:  Tuesday Feb 04 2024 13:52:40 EDT Ventricular Rate:  57 PR Interval:  198 QRS Duration:  118 QT Interval:  446 QTC Calculation: 434 R Axis:   -55  Text Interpretation: Sinus bradycardia Left anterior fascicular block Left ventricular hypertrophy with  QRS widening ( R in aVL , Cornell product ) When compared with ECG of 28-May-2023 10:56, No significant change was found Confirmed by Magnus Schuller ([id number]) on 02/04/2024 2:18:54 PM    January 08, 2023   ECG (independently read by me): Sinus bradycardia at 59, LAD, nonspecific intraventricular block  December 11, 2021 ECG (independently read by me): Sinus bradycardia at 57, LVH, no ectopy  November 21, 2020 ECG (independently read by me): Sinus bradycardia at 54; LAHB, LVH, no ectopy  August 2020 ECG (independently read by me): Sinus bradycardia 53 bpm.  LVH by voltage criteria.  Left anterior hemiblock.  Normal intervals.  October 2017 ECG (independently read by me): Marked sinus bradycardia at 42 bpm.  No ectopy.  LABS:     Latest Ref Rng & Units 01/31/2024    8:19 AM 05/28/2023   10:53 AM 12/19/2022    8:19 AM  BMP  Glucose 70 - 99 mg/dL 84  98  84   BUN 8 - 27 mg/dL 13  12  14    Creatinine 0.76 - 1.27 mg/dL 2.84  1.32  4.40   BUN/Creat Ratio 10 - 24 13   SEE NOTE:   Sodium 134 - 144 mmol/L 144  141  144   Potassium 3.5 - 5.2 mmol/L 3.7  3.9  4.0   Chloride 96 - 106 mmol/L 106  108  106   CO2 20 - 29 mmol/L 23  27  25    Calcium  8.6 - 10.2 mg/dL 8.9  9.0  9.2         Latest Ref Rng & Units 01/31/2024    8:19 AM 12/19/2022    8:19 AM 12/15/2021    8:43 AM  Hepatic Function  Total Protein 6.0 - 8.5 g/dL 6.3  6.4  6.2   Albumin 3.8 - 4.8 g/dL 3.8     AST 0 - 40 IU/L 20  23  24    ALT 0 - 44 IU/L 13  14  17    Alk Phosphatase 44 - 121 IU/L 122     Total Bilirubin 0.0 - 1.2 mg/dL 0.5  0.9  0.7        Latest Ref Rng & Units 01/31/2024    8:19 AM 05/28/2023   10:53  AM 12/19/2022    8:19 AM  CBC  WBC 3.4 - 10.8 x10E3/uL 7.7  7.4  7.8   Hemoglobin 13.0 - 17.7 g/dL 88.4  16.6  06.3   Hematocrit 37.5 - 51.0 % 39.9  43.2  45.5   Platelets 150 - 450 x10E3/uL 217  192  206    Lab Results  Component Value Date   MCV 92 01/31/2024   MCV 98.2 05/28/2023   MCV 92.3 12/19/2022   Lab Results   Component Value Date   TSH 3.580 01/31/2024   No results found for: "HGBA1C"   BNP No results found for: "BNP"  ProBNP No results found for: "PROBNP"   Lipid Panel     Component Value Date/Time   CHOL 86 (L) 01/31/2024 0819   TRIG 65 01/31/2024 0819   HDL 33 (L) 01/31/2024 0819   CHOLHDL 2.6 01/31/2024 0819   CHOLHDL 2.6 12/19/2022 0819   VLDL 18 02/07/2017 0809   LDLCALC 38 01/31/2024 0819   LDLCALC 47 12/19/2022 0819    RADIOLOGY: No results found.    IMPRESSION: 1. Essential hypertension   2. Coronary artery disease involving native coronary artery of native heart without angina pectoris   3. OSA treated with BiPAP   4. Hyperlipidemia LDL goal <70   5. Gastroesophageal reflux disease without esophagitis    ASSESSMENT AND PLAN: 1.  Coronary calcification/CAD: Calcium  score 2264 with diffuse coronary calcification and stenosis in the proximal LAD, diagonal, circumflex marginal and RCA.  Cardiac catheterization on Feb 13, 2019 revealed diffuse multivessel coronary calcification of 50 to 60% in the proximal LAD, diffuse 50% stenosis in the first diagonal branch of the LAD and irregularity of the mid distal LAD without significant stenosis.  The left circumflex vessel had 60% mid distal OM stenosis proximal to the bifurcation and there was mild irregularity with proximal to mid calcification and a dominant RCA.  Presently, he continues to be asymptomatic and denies any chest pain or shortness of breath but due to arthritic issues is not very active.  He continues to be on low-dose amlodipine  2.5 mg in addition to metoprolol  tartrate 25 mg twice a day.  He is on high potency statin therapy.  Most recent LDL cholesterol on Jan 31, 2024 at 38.  2.  Preoperative clearance.  When last seen in 2024, I recommended he undergo nuclear medicine PET CT cardiac perfusion study for preoperative clearance prior to undergoing right reverse shoulder replacement by Dr. Brunilda Capra.  The nuclear PET  study was normal and low risk.  He underwent successful shoulder surgery in September 2020 for without cardiovascular compromise.  3.  Essential hypertension.  Blood pressure today continues to be stable on current therapy with amlodipine  2.5 mg and metoprolol  tartrate 25 mg twice a day.  He has a prescription for HCTZ which she takes on a as needed basis if swelling is present.  4.  OSA on BiPAP: His last set up date May 13, 2018 with the ResMed AirCurve 10 VAuto unit.   He continues to use therapy.  Previous evaluation has shown excellent compliance.  He admits to continued compliance.  He now qualifies for new machine.  I will schedule him to receive a ResMed air curve 11 via auto unit to be in the auto mode with a minimum EPAP of 14, maximum IPAP of 20 with pressure support of 4.  [employer] is his DME company.  5. Hyperlipidemia: On atorvastatin  80 mg. Laboratory from Feb 11, 2021 total  cholesterol 109, LDL 53 triglycerides 83 HDL 39.  Most recent laboratory from December 19, 2022 showed total cholesterol 104, HDL 40, triglycerides 88, and LDL cholesterol at 47.  6. GERD: He continues to be on pantoprazole .  7.  Posttraumatic stress disorder, diagnosed at the [place]. he continues to be followed at the [place].  I discussed with him my upcoming retirement.  He will be transition to the sleep care of Dr. Micael Adas and will need to see her in approximately 3 to 4 months to meet the 90-day compliance window once he receives  new machine.  I will transition him to the cardiology care of Dr. Addie Holstein.  Millicent Ally, MD, [facility] 02/04/2024 5:31 PM

## 2024-02-17 DIAGNOSIS — H5710 Ocular pain, unspecified eye: Secondary | ICD-10-CM | POA: Diagnosis not present

## 2024-02-17 DIAGNOSIS — H25811 Combined forms of age-related cataract, right eye: Secondary | ICD-10-CM | POA: Diagnosis not present

## 2024-02-17 DIAGNOSIS — H26492 Other secondary cataract, left eye: Secondary | ICD-10-CM | POA: Diagnosis not present

## 2024-02-17 DIAGNOSIS — Z961 Presence of intraocular lens: Secondary | ICD-10-CM | POA: Diagnosis not present

## 2024-02-25 DIAGNOSIS — H2511 Age-related nuclear cataract, right eye: Secondary | ICD-10-CM | POA: Diagnosis not present

## 2024-03-02 DIAGNOSIS — H26492 Other secondary cataract, left eye: Secondary | ICD-10-CM | POA: Diagnosis not present

## 2024-03-04 ENCOUNTER — Ambulatory Visit (INDEPENDENT_AMBULATORY_CARE_PROVIDER_SITE_OTHER): Admitting: Podiatry

## 2024-03-04 ENCOUNTER — Encounter: Payer: Self-pay | Admitting: Podiatry

## 2024-03-04 DIAGNOSIS — L608 Other nail disorders: Secondary | ICD-10-CM

## 2024-03-04 DIAGNOSIS — M79674 Pain in right toe(s): Secondary | ICD-10-CM

## 2024-03-04 DIAGNOSIS — M79675 Pain in left toe(s): Secondary | ICD-10-CM

## 2024-03-04 DIAGNOSIS — B351 Tinea unguium: Secondary | ICD-10-CM | POA: Diagnosis not present

## 2024-03-04 NOTE — Progress Notes (Signed)
This patient presents to the office with chief complaint of long thick painful nails.  Patient says the nails are painful walking and wearing shoes.  This patient is unable to self treat.  This patient is unable to trim his  nails since she is unable to reach his nails.  She presents to the office for preventative foot care services.  General Appearance  Alert, conversant and in no acute stress.  Vascular  Dorsalis pedis and posterior tibial  pulses are absent   bilaterally.  Capillary return is within normal limits  bilaterally. Temperature is within normal limits  bilaterally.  Neurologic  Senn-Weinstein monofilament wire test within normal limits  bilaterally. Muscle power within normal limits bilaterally.  Nails Thick disfigured discolored nails with subungual debris hallux nails . No evidence of bacterial infection or drainage bilaterally.  Orthopedic  No limitations of motion  feet .  No crepitus or effusions noted.  No bony pathology or digital deformities noted. Mild HAV  B/L.  Hammer toes with deviated toe 3,4  B/L.  Prominent metsatarsal heads.  Cavus foot type  B/L.  Skin  normotropic skin with no porokeratosis noted bilaterally.  No signs of infections or ulcers noted.     Onychomycosis  Nails  B/L.  Pain in right toes  Pain in left toes  Debridement of nails both feet followed trimming the nails with dremel tool.    RTC 3 months.   Eve Rey DPM   

## 2024-03-05 ENCOUNTER — Telehealth: Payer: Self-pay

## 2024-03-05 NOTE — Telephone Encounter (Signed)
 Copied from CRM [id number]. Topic: Clinical - Lab/Test Results >> Mar 05, 2024  9:25 AM Zipporah Him wrote: Reason for CRM: Patient is coming in to the office for a visit they just wanted to make sure they weren't supposed to get labs done before the visit with dr pickard. They are not requesting labs, they would like a call to inform them yes or no.

## 2024-03-06 ENCOUNTER — Other Ambulatory Visit

## 2024-03-06 DIAGNOSIS — N400 Enlarged prostate without lower urinary tract symptoms: Secondary | ICD-10-CM

## 2024-03-06 DIAGNOSIS — Z125 Encounter for screening for malignant neoplasm of prostate: Secondary | ICD-10-CM

## 2024-03-06 LAB — PSA: PSA: 0.62 ng/mL (ref <=4.00)

## 2024-03-09 ENCOUNTER — Ambulatory Visit: Payer: Self-pay | Admitting: Family Medicine

## 2024-03-11 ENCOUNTER — Telehealth: Payer: Self-pay

## 2024-03-11 NOTE — Telephone Encounter (Signed)
 Copied from CRM [id number]. Topic: Appointments - Scheduling Inquiry for Clinic >> Mar 11, 2024  1:17 PM Oddis Bench wrote: Reason for CRM: Received a call from patient health coach she was calling stating that patient was informed that the appt scheduled for 06/12 was a physical instead of a AWV, she informed me that the patient and his wife had been told this and the time and date for the visit. She stated that the patients would like to have a physical scheduled and she is not sure what steps to take in order to achieve this for the patients. She also stated that she was going to keep the appt for AWV set on tomorrow. Attempted to schedule physical but no option. She informed patient will get call back from office.

## 2024-03-12 ENCOUNTER — Other Ambulatory Visit: Payer: Self-pay | Admitting: Family Medicine

## 2024-03-12 ENCOUNTER — Ambulatory Visit: Payer: No Typology Code available for payment source | Admitting: *Deleted

## 2024-03-12 DIAGNOSIS — Z0000 Encounter for general adult medical examination without abnormal findings: Secondary | ICD-10-CM

## 2024-03-12 MED ORDER — FINASTERIDE 5 MG PO TABS: 5.0000 mg | ORAL_TABLET | Freq: Every day | ORAL | 3 refills | Status: AC

## 2024-03-12 NOTE — Patient Instructions (Signed)
 Gregory Suarez , Thank you for taking time to come for your Medicare Wellness Visit. I appreciate your ongoing commitment to your health goals. Please review the following plan we discussed and let me know if I can assist you in the future.   Screening recommendations/referrals: Colonoscopy: no longer required Recommended yearly ophthalmology/optometry visit for glaucoma screening and checkup Recommended yearly dental visit for hygiene and checkup  Vaccinations: Influenza vaccine:  Pneumococcal vaccine:  Tdap vaccine:     Preventive Care 78 Years and Older, Male Preventive care refers to lifestyle choices and visits with your health care provider that can promote health and wellness. What does preventive care include? A yearly physical exam. This is also called an annual well check. Dental exams once or twice a year. Routine eye exams. Ask your health care provider how often you should have your eyes checked. Personal lifestyle choices, including: Daily care of your teeth and gums. Regular physical activity. Eating a healthy diet. Avoiding tobacco and drug use. Limiting alcohol use. Practicing safe sex. Taking low doses of aspirin  every day. Taking vitamin and mineral supplements as recommended by your health care provider. What happens during an annual well check? The services and screenings done by your health care provider during your annual well check will depend on your age, overall health, lifestyle risk factors, and family history of disease. Counseling  Your health care provider may ask you questions about your: Alcohol use. Tobacco use. Drug use. Emotional well-being. Home and relationship well-being. Sexual activity. Eating habits. History of falls. Memory and ability to understand (cognition). Work and work Astronomer. Screening  You may have the following tests or measurements: Height, weight, and BMI. Blood pressure. Lipid and cholesterol levels. These may be  checked every 5 years, or more frequently if you are over 66 years old. Skin check. Lung cancer screening. You may have this screening every year starting at age 78 if you have a 30-pack-year history of smoking and currently smoke or have quit within the past 15 years. Fecal occult blood test (FOBT) of the stool. You may have this test every year starting at age 78. Flexible sigmoidoscopy or colonoscopy. You may have a sigmoidoscopy every 5 years or a colonoscopy every 10 years starting at age 78. Prostate cancer screening. Recommendations will vary depending on your family history and other risks. Hepatitis C blood test. Hepatitis B blood test. Sexually transmitted disease (STD) testing. Diabetes screening. This is done by checking your blood sugar (glucose) after you have not eaten for a while (fasting). You may have this done every 1-3 years. Abdominal aortic aneurysm (AAA) screening. You may need this if you are a current or former smoker. Osteoporosis. You may be screened starting at age 44 if you are at high risk. Talk with your health care provider about your test results, treatment options, and if necessary, the need for more tests. Vaccines  Your health care provider may recommend certain vaccines, such as: Influenza vaccine. This is recommended every year. Tetanus, diphtheria, and acellular pertussis (Tdap, Td) vaccine. You may need a Td booster every 10 years. Zoster vaccine. You may need this after age 78. Pneumococcal 13-valent conjugate (PCV13) vaccine. One dose is recommended after age 78. Pneumococcal polysaccharide (PPSV23) vaccine. One dose is recommended after age 78. Talk to your health care provider about which screenings and vaccines you need and how often you need them. This information is not intended to replace advice given to you by your health care provider. Make sure  you discuss any questions you have with your health care provider. Document Released: 10/14/2015  Document Revised: 06/06/2016 Document Reviewed: 07/19/2015 Elsevier Interactive Patient Education  2017 [employer].  Fall Prevention in the Home Falls can cause injuries. They can happen to people of all ages. There are many things you can do to make your home safe and to help prevent falls. What can I do on the outside of my home? Regularly fix the edges of walkways and driveways and fix any cracks. Remove anything that might make you trip as you walk through a door, such as a raised step or threshold. Trim any bushes or trees on the path to your home. Use bright outdoor lighting. Clear any walking paths of anything that might make someone trip, such as rocks or tools. Regularly check to see if handrails are loose or broken. Make sure that both sides of any steps have handrails. Any raised decks and porches should have guardrails on the edges. Have any leaves, snow, or ice cleared regularly. Use sand or salt on walking paths during winter. Clean up any spills in your garage right away. This includes oil or grease spills. What can I do in the bathroom? Use night lights. Install grab bars by the toilet and in the tub and shower. Do not use towel bars as grab bars. Use non-skid mats or decals in the tub or shower. If you need to sit down in the shower, use a plastic, non-slip stool. Keep the floor dry. Clean up any water  that spills on the floor as soon as it happens. Remove soap buildup in the tub or shower regularly. Attach bath mats securely with double-sided non-slip rug tape. Do not have throw rugs and other things on the floor that can make you trip. What can I do in the bedroom? Use night lights. Make sure that you have a light by your bed that is easy to reach. Do not use any sheets or blankets that are too big for your bed. They should not hang down onto the floor. Have a firm chair that has side arms. You can use this for support while you get dressed. Do not have throw rugs  and other things on the floor that can make you trip. What can I do in the kitchen? Clean up any spills right away. Avoid walking on wet floors. Keep items that you use a lot in easy-to-reach places. If you need to reach something above you, use a strong step stool that has a grab bar. Keep electrical cords out of the way. Do not use floor polish or wax that makes floors slippery. If you must use wax, use non-skid floor wax. Do not have throw rugs and other things on the floor that can make you trip. What can I do with my stairs? Do not leave any items on the stairs. Make sure that there are handrails on both sides of the stairs and use them. Fix handrails that are broken or loose. Make sure that handrails are as long as the stairways. Check any carpeting to make sure that it is firmly attached to the stairs. Fix any carpet that is loose or worn. Avoid having throw rugs at the top or bottom of the stairs. If you do have throw rugs, attach them to the floor with carpet tape. Make sure that you have a light switch at the top of the stairs and the bottom of the stairs. If you do not have them, ask someone  to add them for you. What else can I do to help prevent falls? Wear shoes that: Do not have high heels. Have rubber bottoms. Are comfortable and fit you well. Are closed at the toe. Do not wear sandals. If you use a stepladder: Make sure that it is fully opened. Do not climb a closed stepladder. Make sure that both sides of the stepladder are locked into place. Ask someone to hold it for you, if possible. Clearly mark and make sure that you can see: Any grab bars or handrails. First and last steps. Where the edge of each step is. Use tools that help you move around (mobility aids) if they are needed. These include: Canes. Walkers. Scooters. Crutches. Turn on the lights when you go into a dark area. Replace any light bulbs as soon as they burn out. Set up your furniture so you have a  clear path. Avoid moving your furniture around. If any of your floors are uneven, fix them. If there are any pets around you, be aware of where they are. Review your medicines with your doctor. Some medicines can make you feel dizzy. This can increase your chance of falling. Ask your doctor what other things that you can do to help prevent falls. This information is not intended to replace advice given to you by your health care provider. Make sure you discuss any questions you have with your health care provider. Document Released: 07/14/2009 Document Revised: 02/23/2016 Document Reviewed: 10/22/2014 Elsevier Interactive Patient Education  2017 [employer].

## 2024-03-12 NOTE — Progress Notes (Signed)
 Subjective:   Gregory Suarez is a 78 y.o. male who presents for Medicare Annual/Subsequent preventive examination.  Visit Complete: Virtual I connected with  Gregory Suarez on 03/12/24 by a audio enabled telemedicine application and verified that I am speaking with the correct person using two identifiers.  Patient Location: Home  Provider Location: Home Office  I discussed the limitations of evaluation and management by telemedicine. The patient expressed understanding and agreed to proceed.  Vital Signs: Because this visit was a virtual/telehealth visit, some criteria may be missing or patient reported. Any vitals not documented were not able to be obtained and vitals that have been documented are patient reported.   Cardiac Risk Factors include: male gender;hypertension;advanced age (>33men, >44 women)     Objective:    Today's Vitals   03/12/24 1520  PainSc: 3    There is no height or weight on file to calculate BMI.     03/12/2024    3:26 PM 06/07/2023   11:07 AM 06/07/2023    5:55 AM 05/28/2023   10:18 AM 05/03/2022    1:39 PM 12/14/2020    9:39 AM 04/07/2020   10:55 AM  Advanced Directives  Does Patient Have a Medical Advance Directive? Yes Yes Yes No;Yes No Yes No  Type of Estate agent of [employer] Power of [place];Living will Healthcare Power of [place];Living will Healthcare Power of [place];Living will     Does patient want to make changes to medical advance directive?  No - Patient declined No - Patient declined   No - Patient declined   Copy of Healthcare Power of Attorney in Chart? Yes - validated most recent copy scanned in chart (See row information) No - copy requested No - copy requested      Would patient like information on creating a medical advance directive?     No - Patient declined  No - Patient declined    Current Medications (verified) Outpatient Encounter Medications as of 03/12/2024  Medication Sig   acetaminophen   (TYLENOL ) 500 MG tablet Take 500-1,000 mg by mouth every 6 (six) hours as needed for moderate pain.   Alpha-D-Galactosidase (BEANO) TABS Take 3 tablets by mouth at bedtime.   amLODipine  (NORVASC ) 2.5 MG tablet Take 1 tablet (2.5 mg total) by mouth every evening.   aspirin  EC 81 MG tablet Take 1 tablet (81 mg total) by mouth daily.   atorvastatin  (LIPITOR ) 80 MG tablet Take 1 tablet (80 mg total) by mouth daily.   benzonatate  (TESSALON ) 100 MG capsule Take 100 mg by mouth 3 (three) times daily as needed for cough.   chlorpheniramine (CHLOR-TRIMETON) 4 MG tablet Take 8 mg by mouth at bedtime.   cyanocobalamin  (VITAMIN B12) 1000 MCG/ML injection Inject 1,000 mcg into the muscle every 30 (thirty) days.   finasteride  (PROSCAR ) 5 MG tablet Take 1 tablet (5 mg total) by mouth daily.   hydrochlorothiazide  (MICROZIDE ) 12.5 MG capsule Take 1 capsule (12.5 mg total) by mouth daily as needed (fluid/swelling).   hydrocortisone  2.5 % cream Apply topically as directed. Apply to scaly rash on face hs on Tuesday, Thursday, and Saturday   ketoconazole  (NIZORAL ) 2 % cream Apply 1 application. topically daily. Qhs to feet for 1 month, Apply to scaly areas on face 3 times weekly, Monday, Wednesday and Friday   meclizine  (ANTIVERT ) 25 MG tablet Take 25 mg by mouth 3 (three) times daily as needed for dizziness.   Melatonin 5 MG TABS Take 10 mg by mouth at bedtime.  metoprolol  tartrate (LOPRESSOR ) 25 MG tablet Take 1 tablet (25 mg total) by mouth 2 (two) times daily.   mometasone  (ELOCON ) 0.1 % cream Apply 1 Application topically daily. Apply to affected area of back up to 5 days per week prn   pantoprazole  (PROTONIX ) 40 MG tablet Take 1 tablet (40 mg total) by mouth 2 (two) times daily.   prednisoLONE sodium phosphate  (INFLAMASE FORTE) 1 % ophthalmic solution 1 drop 4 (four) times daily.   tadalafil  (CIALIS ) 5 MG tablet Take 1 tablet (5 mg total) by mouth daily.   terbinafine  (LAMISIL ) 250 MG tablet Take 1 tablet (250  mg total) by mouth daily.   tiZANidine  (ZANAFLEX ) 4 MG tablet Take 1 tablet (4 mg total) by mouth every 8 (eight) hours as needed for muscle spasms.   traMADol  (ULTRAM ) 50 MG tablet Take 1 tablet (50 mg total) by mouth every 6 (six) hours as needed.   valACYclovir  (VALTREX ) 1000 MG tablet Take 1,000 mg by mouth daily as needed (outbreak).   VITAMIN D -VITAMIN K  PO Take 1 tablet by mouth daily.   carbidopa -levodopa  (SINEMET  CR) 50-200 MG tablet Take 1 tablet by mouth at bedtime. (Patient not taking: Reported on 03/12/2024)   No facility-administered encounter medications on file as of 03/12/2024.    Allergies (verified) Tape and Crestor [rosuvastatin]   History: Past Medical History:  Diagnosis Date   Actinic keratosis 12/26/2023   L zygoma, [facility]   AK (actinic keratosis) 12/26/2023   right lateral cheek, [facility]   Anxiety    Arthritis    Basal cell carcinoma 06/28/2015   left temple - excised   Basal cell carcinoma 08/05/2017   right cheek inf to zygoma   Basal cell carcinoma 05/09/2020   right prox mandible   CAD (coronary artery disease)    COPD (chronic obstructive pulmonary disease) (HCC)    Depression    Elevated PSA    Erectile dysfunction    GERD (gastroesophageal reflux disease)    Hyperlipidemia    Mitral valve regurgitation    OSA treated with BiPAP    Pneumonia    Pneumothorax    ultimatley required talc  pleurodiesis (2021)   PONV (postoperative nausea and vomiting)    Post traumatic stress disorder (PTSD)    RLS (restless legs syndrome)    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   right temple : sccis - shave   Tubular adenoma of colon    Past Surgical History:  Procedure Laterality Date   ANKLE SURGERY     BIOPSY  05/08/2022   Procedure: BIOPSY;  Surgeon: Urban Garden, MD;  Location: [facility];  Service: Gastroenterology;;   CHOLECYSTECTOMY     CHOLECYSTECTOMY     COLONOSCOPY WITH PROPOFOL  N/A 05/08/2022   Procedure: COLONOSCOPY WITH  PROPOFOL ;  Surgeon: Urban Garden, MD;  Location: [facility];  Service: Gastroenterology;  Laterality: N/A;  730 ASA 2   ESOPHAGOGASTRODUODENOSCOPY (EGD) WITH PROPOFOL  N/A 05/08/2022   Procedure: ESOPHAGOGASTRODUODENOSCOPY (EGD) WITH PROPOFOL ;  Surgeon: Urban Garden, MD;  Location: [facility];  Service: Gastroenterology;  Laterality: N/A;   hydrocele surgery      LEFT HEART CATH AND CORONARY ANGIOGRAPHY N/A 02/13/2019   Procedure: LEFT HEART CATH AND CORONARY ANGIOGRAPHY;  Surgeon: Millicent Ally, MD;  Location: [facility];  Service: Cardiovascular;  Laterality: N/A;   MOHS SURGERY     POLYPECTOMY  05/08/2022   Procedure: POLYPECTOMY;  Surgeon: Urban Garden, MD;  Location: [facility];  Service: Gastroenterology;;   REVERSE SHOULDER ARTHROPLASTY Right 06/07/2023   Procedure: REVERSE SHOULDER ARTHROPLASTY;  Surgeon: Winston Hawking, MD;  Location: [facility];  Service: Orthopedics;  Laterality: Right;   Family History  Problem Relation Age of Onset   Diabetes Other    Breast cancer Other    Coronary artery disease Other    Social History   Socioeconomic History   Marital status: Married    Spouse name: Not on file   Number of children: Not on file   Years of education: Not on file   Highest education level: Not on file  Occupational History   Occupation: RETIRED  Tobacco Use   Smoking status: Former    Current packs/day: 0.00    Average packs/day: 0.5 packs/day for 15.0 years (7.5 ttl pk-yrs)    Types: Cigarettes    Start date: 10/01/1973    Quit date: 10/01/1988    Years since quitting: 35.4    Passive exposure: Past   Smokeless tobacco: Never  Vaping Use   Vaping status: Never Used  Substance and Sexual Activity   Alcohol use: No    Alcohol/week: 0.0 standard drinks of alcohol   Drug use: No   Sexual activity: Yes    Comment: married, 2 grown sons, retired maintenance man  Other Topics Concern   Not on file  Social History  Narrative   Not on file   Social Drivers of Health   Financial Resource Strain: Low Risk  (03/12/2024)   Overall Financial Resource Strain (CARDIA)    Difficulty of Paying Living Expenses: Not hard at all  Food Insecurity: No Food Insecurity (03/12/2024)   Hunger Vital Sign    Worried About Running Out of Food in the Last Year: Never true    Ran Out of Food in the Last Year: Never true  Transportation Needs: No Transportation Needs (03/12/2024)   PRAPARE - Administrator, Civil Service (Medical): No    Lack of Transportation (Non-Medical): No  Physical Activity: Inactive (03/12/2024)   Exercise Vital Sign    Days of Exercise per Week: 0 days    Minutes of Exercise per Session: 0 min  Stress: No Stress Concern Present (03/12/2024)   [employer] of Occupational Health - Occupational Stress Questionnaire    Feeling of Stress: Only a little  Social Connections: Moderately Isolated (03/12/2024)   Social Connection and Isolation Panel    Frequency of Communication with Friends and Family: Once a week    Frequency of Social Gatherings with Friends and Family: Never    Attends Religious Services: More than 4 times per year    Active Member of [employer] or Organizations: No    Attends Engineer, structural: Never    Marital Status: Married    Tobacco Counseling Counseling given: Not Answered   Clinical Intake:  Pre-visit preparation completed: Yes  Pain : 0-10 Pain Score: 3  Pain Type: Chronic pain Pain Location: Back Pain Descriptors / Indicators: Burning, Aching, Dull Pain Onset: 1 to 4 weeks ago Pain Frequency: Constant     Diabetes: No  How often do you need to have someone help you when you read instructions, pamphlets, or other written materials from your doctor or pharmacy?: 1 - Never  Interpreter Needed?: No  Information entered by :: Kieth Pelt LPN   Activities of Daily Living    03/12/2024    3:27 PM 06/07/2023   11:13 AM  In your present  state of health,  do you have any difficulty performing the following activities:  Hearing? 1   Vision? 0   Difficulty concentrating or making decisions? 0   Walking or climbing stairs? 1   Dressing or bathing? 0   Doing errands, shopping? 0 0  Preparing Food and eating ? N   Using the Toilet? N   In the past six months, have you accidently leaked urine? Y   Do you have problems with loss of bowel control? N   Managing your Medications? N   Managing your Finances? N   Housekeeping or managing your Housekeeping? Y     Patient Care Team: Austine Lefort, MD as PCP - General (Family Medicine) Millicent Ally, MD as PCP - Cardiology (Cardiology) Millicent Ally, MD as PCP - Sleep Medicine (Cardiology) Myrle Aspen, [facility] (Inactive) as Pharmacist (Pharmacist)  Indicate any recent Medical Services you may have received from other than [facility] providers in the past year (date may be approximate).     Assessment:   This is a routine wellness examination for Gregory Suarez.  Hearing/Vision screen Hearing Screening - Comments:: Hearing aids bilateral  Vision Screening - Comments:: Up to date [facility]   Goals Addressed             This Visit's Progress    Patient Stated       Have more energy       Depression Screen    03/12/2024    3:32 PM 12/14/2020    9:38 AM 12/10/2019   10:53 AM 11/11/2018    9:04 AM 07/24/2018   10:31 AM 07/24/2018    8:39 AM 02/11/2017   10:52 AM  PHQ 2/9 Scores  PHQ - 2 Score 2 0 3 4  2  0  PHQ- 9 Score 10  15 22  13    Exception Documentation     Other- indicate reason in comment box    Not completed     Currently under the care of a psychiatrist for PTSD.  Is on Seroquel and hydroxyzine with BuSpar      Fall Risk    03/12/2024    3:21 PM 12/14/2020    9:38 AM 12/10/2019   10:53 AM 07/24/2018   10:31 AM 07/24/2018    8:39 AM  Fall Risk   Falls in the past year? 1 0 0  No  No   Number falls in past yr: 0      Injury with Fall? 0      Risk for fall  due to :  History of fall(s);No Fall Risks No Fall Risks    Follow up Falls evaluation completed;Education provided;Falls prevention discussed Falls evaluation completed  Falls evaluation completed        Data saved with a previous flowsheet row definition    MEDICARE RISK AT HOME: Medicare Risk at Home Any stairs in or around the home?: Yes If so, are there any without handrails?: No Home free of loose throw rugs in walkways, pet beds, electrical cords, etc?: Yes Adequate lighting in your home to reduce risk of falls?: Yes Life alert?: Yes Use of a cane, walker or w/c?: No Grab bars in the bathroom?: Yes Shower chair or bench in shower?: Yes Elevated toilet seat or a handicapped toilet?: Yes  TIMED UP AND GO:  Was the test performed?  No    Cognitive Function:        03/12/2024    3:28 PM  6CIT Screen  What Year? 0 points  What month? 0 points  What time? 0 points  Count back from 20 0 points  Months in reverse 0 points  Repeat phrase 0 points  Total Score 0 points    Immunizations Immunization History  Administered Date(s) Administered   Fluad Quad(high Dose 65+) 07/23/2019   Influenza, High Dose Seasonal PF 07/24/2018   PFIZER(Purple Top)SARS-COV-2 Vaccination 10/27/2019, 11/17/2019, 07/26/2020   Pneumococcal Conjugate-13 01/16/2014   Pneumococcal Polysaccharide-23 06/01/2010, 03/02/2011, 04/01/2011, 02/04/2015, 08/15/2020   Td 03/31/2009   Tdap 05/24/2009, 10/01/2010   Zoster Recombinant(Shingrix) 08/15/2020, 10/20/2020   Zoster, Live 07/06/2011, 03/01/2014    TDAP status: Due, Education has been provided regarding the importance of this vaccine. Advised may receive this vaccine at local pharmacy or Health Dept. Aware to provide a copy of the vaccination record if obtained from local pharmacy or Health Dept. Verbalized acceptance and understanding.  Flu Vaccine status: Up to date  Pneumococcal vaccine status: Up to date  Covid-19 vaccine status:  Information provided on how to obtain vaccines.   Qualifies for Shingles Vaccine? No   Zostavax completed Yes   Shingrix Completed?: Yes  Screening Tests Health Maintenance  Topic Date Due   DTaP/Tdap/Td (4 - Td or Tdap) 10/01/2020   COVID-19 Vaccine (4 - 2024-25 season) 06/02/2023   INFLUENZA VACCINE  05/01/2024   Medicare Annual Wellness (AWV)  03/12/2025   Colonoscopy  05/08/2025   Pneumococcal Vaccine: 50+ Years  Completed   Hepatitis C Screening  Completed   Zoster Vaccines- Shingrix  Completed   HPV VACCINES  Aged Out   Meningococcal B Vaccine  Aged Out    Health Maintenance  Health Maintenance Due  Topic Date Due   DTaP/Tdap/Td (4 - Td or Tdap) 10/01/2020   COVID-19 Vaccine (4 - 2024-25 season) 06/02/2023    Colorectal cancer screening: No longer required.   Lung Cancer Screening: (Low Dose CT Chest recommended if Age 66-80 years, 20 pack-year currently smoking OR have quit w/in 15years.) does not qualify.   Lung Cancer Screening Referral:   Additional Screening:  Hepatitis C Screening: does not qualify; Completed 2022  Vision Screening: Recommended annual ophthalmology exams for early detection of glaucoma and other disorders of the eye. Is the patient up to date with their annual eye exam?  Yes  Who is the provider or what is the name of the office in which the patient attends annual eye exams? [facility] If pt is not established with a provider, would they like to be referred to a provider to establish care? No .   Dental Screening: Recommended annual dental exams for proper oral hygiene    Community Resource Referral / Chronic Care Management: CRR required this visit?  No   CCM required this visit?  No     Plan:     I have personally reviewed and noted the following in the patient's chart:   Medical and social history Use of alcohol, tobacco or illicit drugs  Current medications and supplements including opioid prescriptions. Patient is not  currently taking opioid prescriptions. Functional ability and status Nutritional status Physical activity Advanced directives List of other physicians Hospitalizations, surgeries, and ER visits in previous 12 months Vitals Screenings to include cognitive, depression, and falls Referrals and appointments  In addition, I have reviewed and discussed with patient certain preventive protocols, quality metrics, and best practice recommendations. A written personalized care plan for preventive services as well as general preventive health recommendations were provided to patient.     Giomar Gusler, LPN  03/12/2024   After Visit Summary: (MyChart) Due to this being a telephonic visit, the after visit summary with patients personalized plan was offered to patient via MyChart   Nurse Notes:

## 2024-03-19 ENCOUNTER — Ambulatory Visit: Attending: Cardiology | Admitting: Cardiology

## 2024-03-19 ENCOUNTER — Encounter: Payer: Self-pay | Admitting: Cardiology

## 2024-03-19 VITALS — BP 108/62 | HR 46 | Ht 75.5 in | Wt 231.0 lb

## 2024-03-19 DIAGNOSIS — G4733 Obstructive sleep apnea (adult) (pediatric): Secondary | ICD-10-CM | POA: Diagnosis not present

## 2024-03-19 DIAGNOSIS — I10 Essential (primary) hypertension: Secondary | ICD-10-CM | POA: Diagnosis not present

## 2024-03-19 NOTE — Progress Notes (Signed)
 \Date:  03/19/2024   ID:  Gregory Suarez, DOB July 17, 1946, MRN [id number] The patient was identified using 2 identifiers.  PCP:  Austine Lefort, MD  Cardiologist:  Randene Bustard, MD Sleep Medicine:  Gaylyn Keas, MD  Referring MD: Austine Lefort, MD   Chief Complaint  Patient presents with   Sleep Apnea    History of Present Illness:    Gregory Suarez is a 78 y.o. male with a hx of CAD, COPD, depression, GERD, hyperlipidemia, MR and restless leg syndrome.  He also has a history of obstructive sleep apnea on BiPAP.  He had been followed for many years by Dr. Bennetta Braun and then followed by Dr. Loetta Ringer after Dr. Bennetta Braun  left.  When he last saw Dr. Loetta Ringer he was in need of a new BiPAP device since his was more than 78 years old.  He was prescribed a ResMed air curve 11 auto BiPAP with an EPAP min of 14 cm H2O and IPAP max of 20 cm H2O with a pressure support of 4.  Of note [facility] is his DME.  He is now back for follow-up after getting his new device over 5 weeks ago.  He is doing well with his PAP device and thinks that he has gotten used to it.  He tolerates the full face mask and feels the pressure is adequate. He has never felt rested when he wakes up in the am over the years that he has used a PAP device.  He has PTSD and does not sleep well at night so he naps during the day.  He denies any significant mouth or nasal dryness.  He has chronic sinus congestion and has seen ENT in the past but can only use nasal saline spray for a short period because he then gets epistaxis.  He does not think that he snores.     Past Medical History:  Diagnosis Date   Actinic keratosis 12/26/2023   L zygoma, [facility]   AK (actinic keratosis) 12/26/2023   right lateral cheek, [facility]   Anxiety    Arthritis    Basal cell carcinoma 06/28/2015   left temple - excised   Basal cell carcinoma 08/05/2017   right cheek inf to zygoma   Basal cell carcinoma 05/09/2020   right prox mandible   CAD (coronary artery disease)     COPD (chronic obstructive pulmonary disease) (HCC)    Depression    Elevated PSA    Erectile dysfunction    GERD (gastroesophageal reflux disease)    Hyperlipidemia    Mitral valve regurgitation    OSA treated with BiPAP    Pneumonia    Pneumothorax    ultimatley required talc  pleurodiesis (2021)   PONV (postoperative nausea and vomiting)    Post traumatic stress disorder (PTSD)    RLS (restless legs syndrome)    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   right temple : sccis - shave   Tubular adenoma of colon     Past Surgical History:  Procedure Laterality Date   ANKLE SURGERY     BIOPSY  05/08/2022   Procedure: BIOPSY;  Surgeon: Urban Garden, MD;  Location: [facility];  Service: Gastroenterology;;   CHOLECYSTECTOMY     CHOLECYSTECTOMY     COLONOSCOPY WITH PROPOFOL  N/A 05/08/2022   Procedure: COLONOSCOPY WITH PROPOFOL ;  Surgeon: Urban Garden, MD;  Location: [facility];  Service: Gastroenterology;  Laterality: N/A;  730 ASA 2   ESOPHAGOGASTRODUODENOSCOPY (EGD) WITH PROPOFOL  N/A  05/08/2022   Procedure: ESOPHAGOGASTRODUODENOSCOPY (EGD) WITH PROPOFOL ;  Surgeon: Urban Garden, MD;  Location: [facility];  Service: Gastroenterology;  Laterality: N/A;   hydrocele surgery      LEFT HEART CATH AND CORONARY ANGIOGRAPHY N/A 02/13/2019   Procedure: LEFT HEART CATH AND CORONARY ANGIOGRAPHY;  Surgeon: Millicent Ally, MD;  Location: [facility];  Service: Cardiovascular;  Laterality: N/A;   MOHS SURGERY     POLYPECTOMY  05/08/2022   Procedure: POLYPECTOMY;  Surgeon: Urban Garden, MD;  Location: [facility];  Service: Gastroenterology;;   REVERSE SHOULDER ARTHROPLASTY Right 06/07/2023   Procedure: REVERSE SHOULDER ARTHROPLASTY;  Surgeon: Winston Hawking, MD;  Location: [facility];  Service: Orthopedics;  Laterality: Right;    Current Medications: Current Meds  Medication Sig   acetaminophen  (TYLENOL ) 500 MG tablet Take  500-1,000 mg by mouth every 6 (six) hours as needed for moderate pain.   Alpha-D-Galactosidase (BEANO) TABS Take 3 tablets by mouth at bedtime.   amLODipine  (NORVASC ) 2.5 MG tablet Take 1 tablet (2.5 mg total) by mouth every evening.   aspirin  EC 81 MG tablet Take 1 tablet (81 mg total) by mouth daily.   atorvastatin  (LIPITOR ) 80 MG tablet Take 1 tablet (80 mg total) by mouth daily.   benzonatate  (TESSALON ) 100 MG capsule Take 100 mg by mouth 3 (three) times daily as needed for cough.   chlorpheniramine (CHLOR-TRIMETON) 4 MG tablet Take 8 mg by mouth at bedtime.   cyanocobalamin  (VITAMIN B12) 1000 MCG/ML injection Inject 1,000 mcg into the muscle every 30 (thirty) days.   finasteride  (PROSCAR ) 5 MG tablet Take 1 tablet (5 mg total) by mouth daily.   hydrochlorothiazide  (MICROZIDE ) 12.5 MG capsule Take 1 capsule (12.5 mg total) by mouth daily as needed (fluid/swelling).   hydrocortisone  2.5 % cream Apply topically as directed. Apply to scaly rash on face hs on Tuesday, Thursday, and Saturday   ketoconazole  (NIZORAL ) 2 % cream Apply 1 application. topically daily. Qhs to feet for 1 month, Apply to scaly areas on face 3 times weekly, Monday, Wednesday and Friday   meclizine  (ANTIVERT ) 25 MG tablet Take 25 mg by mouth 3 (three) times daily as needed for dizziness.   Melatonin 5 MG TABS Take 10 mg by mouth at bedtime.    metoprolol  tartrate (LOPRESSOR ) 25 MG tablet Take 1 tablet (25 mg total) by mouth 2 (two) times daily.   mometasone  (ELOCON ) 0.1 % cream Apply 1 Application topically daily. Apply to affected area of back up to 5 days per week prn   pantoprazole  (PROTONIX ) 40 MG tablet Take 1 tablet (40 mg total) by mouth 2 (two) times daily.   prednisoLONE sodium phosphate  (INFLAMASE FORTE) 1 % ophthalmic solution 1 drop 4 (four) times daily.   tadalafil  (CIALIS ) 5 MG tablet Take 1 tablet (5 mg total) by mouth daily.   terbinafine  (LAMISIL ) 250 MG tablet Take 1 tablet (250 mg total) by mouth daily.    tiZANidine  (ZANAFLEX ) 4 MG tablet Take 1 tablet (4 mg total) by mouth every 8 (eight) hours as needed for muscle spasms.   traMADol  (ULTRAM ) 50 MG tablet Take 1 tablet (50 mg total) by mouth every 6 (six) hours as needed.   valACYclovir  (VALTREX ) 1000 MG tablet Take 1,000 mg by mouth daily as needed (outbreak).   VITAMIN D -VITAMIN K  PO Take 1 tablet by mouth daily.     Allergies:   Tape and Crestor [rosuvastatin]   Social History   Socioeconomic History  Marital status: Married    Spouse name: Not on file   Number of children: Not on file   Years of education: Not on file   Highest education level: Not on file  Occupational History   Occupation: RETIRED  Tobacco Use   Smoking status: Former    Current packs/day: 0.00    Average packs/day: 0.5 packs/day for 15.0 years (7.5 ttl pk-yrs)    Types: Cigarettes    Start date: 10/01/1973    Quit date: 10/01/1988    Years since quitting: 35.4    Passive exposure: Past   Smokeless tobacco: Never  Vaping Use   Vaping status: Never Used  Substance and Sexual Activity   Alcohol use: No    Alcohol/week: 0.0 standard drinks of alcohol   Drug use: No   Sexual activity: Yes    Comment: married, 2 grown sons, retired maintenance man  Other Topics Concern   Not on file  Social History Narrative   Not on file   Social Drivers of Health   Financial Resource Strain: Low Risk  (03/12/2024)   Overall Financial Resource Strain (CARDIA)    Difficulty of Paying Living Expenses: Not hard at all  Food Insecurity: No Food Insecurity (03/12/2024)   Hunger Vital Sign    Worried About Running Out of Food in the Last Year: Never true    Ran Out of Food in the Last Year: Never true  Transportation Needs: No Transportation Needs (03/12/2024)   PRAPARE - Administrator, Civil Service (Medical): No    Lack of Transportation (Non-Medical): No  Physical Activity: Inactive (03/12/2024)   Exercise Vital Sign    Days of Exercise per Week: 0 days     Minutes of Exercise per Session: 0 min  Stress: No Stress Concern Present (03/12/2024)   [employer] of Occupational Health - Occupational Stress Questionnaire    Feeling of Stress: Only a little  Social Connections: Moderately Isolated (03/12/2024)   Social Connection and Isolation Panel    Frequency of Communication with Friends and Family: Once a week    Frequency of Social Gatherings with Friends and Family: Never    Attends Religious Services: More than 4 times per year    Active Member of [employer] or Organizations: No    Attends Engineer, structural: Never    Marital Status: Married     Family History: The patient's family history includes Breast cancer in an other family member; Coronary artery disease in an other family member; Diabetes in an other family member.  ROS:   Please see the history of present illness.    ROS  All other systems reviewed and negative.   EKGs/Labs/Other Studies Reviewed:    The following studies were reviewed today: PAP compliance download  Physical Exam:    VS:  BP 108/62   Pulse (!) 46   Ht 6' 3.5 (1.918 m)   Wt 231 lb (104.8 kg)   SpO2 98%   BMI 28.49 kg/m     Wt Readings from Last 3 Encounters:  03/19/24 231 lb (104.8 kg)  02/04/24 228 lb 3.2 oz (103.5 kg)  06/07/23 225 lb 15.5 oz (102.5 kg)    GEN: Well nourished, well developed in no acute distress HEENT: Normal NECK: No JVD; No carotid bruits LYMPHATICS: No lymphadenopathy CARDIAC:RRR, no murmurs, rubs, gallops RESPIRATORY:  Clear to auscultation without rales, wheezing or rhonchi  ABDOMEN: Soft, non-tender, non-distended MUSCULOSKELETAL:  No edema; No deformity  SKIN: Warm and  dry NEUROLOGIC:  Alert and oriented x 3 PSYCHIATRIC:  Normal affect   ASSESSMENT:    1. OSA treated with BiPAP   2. Essential hypertension    PLAN:    In order of problems listed above:  OSA - The patient is tolerating PAP therapy well without any problems. The PAP download  performed by his DME was personally reviewed and interpreted by me today and showed an AHI of 2.3 /hr on auto BiPAP with IPAP max 20 cm H2O, EPAP min 14 cm H2O and pressure support 4 cm H2O cm H2O with 100% compliance in using more than 4 hours nightly.  The patient has been using and benefiting from PAP use and will continue to benefit from therapy.   Hypertension - BP controlled on exam today - Continue amlodipine  2.5 mg daily, Lopressor  25 mg twice daily with as needed refills  Time Spent: 20 minutes total time of encounter, including 15 minutes spent in face-to-face patient care on the date of this encounter. This time includes coordination of care and counseling regarding above mentioned problem list. Remainder of non-face-to-face time involved reviewing chart documents/testing relevant to the patient encounter and documentation in the medical record. I have independently reviewed documentation from referring provider  Medication Adjustments/Labs and Tests Ordered: Current medicines are reviewed at length with the patient today.  Concerns regarding medicines are outlined above.  No orders of the defined types were placed in this encounter.  No orders of the defined types were placed in this encounter.   Signed, Gaylyn Keas, MD  03/19/2024 11:13 AM    [facility]

## 2024-03-19 NOTE — Patient Instructions (Signed)
 Medication Instructions:  Your physician recommends that you continue on your current medications as directed. Please refer to the Current Medication list given to you today.  *If you need a refill on your cardiac medications before your next appointment, please call your pharmacy*  Lab Work: None.  If you have labs (blood work) drawn today and your tests are completely normal, you will receive your results only by: MyChart Message (if you have MyChart) OR A paper copy in the mail If you have any lab test that is abnormal or we need to change your treatment, we will call you to review the results.  Testing/Procedures: None.  Follow-Up: At [facility], you and your health needs are our priority.  As part of our continuing mission to provide you with exceptional heart care, our providers are all part of one team.  This team includes your primary Cardiologist (physician) and Advanced Practice Providers or APPs (Physician Assistants and Nurse Practitioners) who all work together to provide you with the care you need, when you need it.  Your next appointment:   1 year(s)  Provider:   Dr. Gaylyn Keas, MD   We recommend signing up for the patient portal called MyChart.  Sign up information is provided on this After Visit Summary.  MyChart is used to connect with patients for Virtual Visits (Telemedicine).  Patients are able to view lab/test results, encounter notes, upcoming appointments, etc.  Non-urgent messages can be sent to your provider as well.   To learn more about what you can do with MyChart, go to [email or web address].   Other Instructions Please schedule to make an appointment with Dr. Addie Holstein for continued follow up care.

## 2024-04-24 ENCOUNTER — Ambulatory Visit: Admitting: Family Medicine

## 2024-04-24 ENCOUNTER — Encounter: Payer: Self-pay | Admitting: Family Medicine

## 2024-04-24 VITALS — BP 126/62 | HR 43 | Temp 98.9°F | Ht 75.5 in | Wt 231.0 lb

## 2024-04-24 DIAGNOSIS — E785 Hyperlipidemia, unspecified: Secondary | ICD-10-CM | POA: Diagnosis not present

## 2024-04-24 DIAGNOSIS — M7660 Achilles tendinitis, unspecified leg: Secondary | ICD-10-CM | POA: Insufficient documentation

## 2024-04-24 DIAGNOSIS — I10 Essential (primary) hypertension: Secondary | ICD-10-CM

## 2024-04-24 DIAGNOSIS — H903 Sensorineural hearing loss, bilateral: Secondary | ICD-10-CM | POA: Insufficient documentation

## 2024-04-24 DIAGNOSIS — I2510 Atherosclerotic heart disease of native coronary artery without angina pectoris: Secondary | ICD-10-CM | POA: Diagnosis not present

## 2024-04-24 DIAGNOSIS — N400 Enlarged prostate without lower urinary tract symptoms: Secondary | ICD-10-CM | POA: Diagnosis not present

## 2024-04-24 DIAGNOSIS — Z0000 Encounter for general adult medical examination without abnormal findings: Secondary | ICD-10-CM

## 2024-04-24 LAB — COMPREHENSIVE METABOLIC PANEL WITH GFR
AG Ratio: 1.3 (calc) (ref 1.0–2.5)
ALT: 9 U/L (ref 9–46)
AST: 16 U/L (ref 10–35)
Albumin: 4 g/dL (ref 3.6–5.1)
Alkaline phosphatase (APISO): 116 U/L (ref 35–144)
BUN: 13 mg/dL (ref 7–25)
CO2: 27 mmol/L (ref 20–32)
Calcium: 8.9 mg/dL (ref 8.6–10.3)
Chloride: 105 mmol/L (ref 98–110)
Creat: 0.88 mg/dL (ref 0.70–1.28)
Globulin: 3 g/dL (ref 1.9–3.7)
Glucose, Bld: 78 mg/dL (ref 65–99)
Potassium: 4.3 mmol/L (ref 3.5–5.3)
Sodium: 140 mmol/L (ref 135–146)
Total Bilirubin: 0.8 mg/dL (ref 0.2–1.2)
Total Protein: 7 g/dL (ref 6.1–8.1)
eGFR: 89 mL/min/1.73m2 (ref >=60)

## 2024-04-24 NOTE — Progress Notes (Signed)
 Subjective:    Patient ID: Gregory Suarez, male    DOB: 01-31-1946, 78 y.o.   MRN: [id number]  HPI  Patient is a very pleasant 78 year old Caucasian gentleman here today for an annual wellness visit.  Patient had 3 polyps in 2023 on his colonoscopy.  They recommended a repeat colonoscopy in 3 years so he is due in 2026.   Patient has significant bradycardia on exam.  His resting heart rate is typically in the 40s.  He states that with activity it will get as high as 60.  He complains of fatigue and feeling tired all the time.  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  He states that he also gets off balance easily.  He is currently wearing sequential compression devices on both legs due to lymphedema.  He has +1 to +2 pitting edema in his legs without the SCDs.  They are wearing compression pumps at home to help manage the lymphedema and this is helping substantially Immunization History  Administered Date(s) Administered   Fluad Quad(high Dose 65+) 07/23/2019   Influenza, High Dose Seasonal PF 07/24/2018   PFIZER(Purple Top)SARS-COV-2 Vaccination 10/27/2019, 11/17/2019, 07/26/2020   Pneumococcal Conjugate-13 01/16/2014   Pneumococcal Polysaccharide-23 06/01/2010, 03/02/2011, 04/01/2011, 02/04/2015, 08/15/2020   Td 03/31/2009   Tdap 05/24/2009, 10/01/2010   Zoster Recombinant(Shingrix) 08/15/2020, 10/20/2020   Zoster, Live 07/06/2011, 03/01/2014    Lab on 03/06/2024  Component Date Value Ref Range Status   PSA 03/06/2024 0.62  < OR = 4.00 ng/mL Final   Comment: The total PSA value from this assay system is  standardized against the WHO standard. The test  result will be approximately 20% lower when compared  to the equimolar-standardized total PSA (Beckman  Coulter). Comparison of serial PSA results should be  interpreted with this fact in mind. . This test was performed using the Siemens  chemiluminescent method. Values obtained from  different assay methods cannot be  used interchangeably. PSA levels, regardless of value, should not be interpreted as absolute evidence of the presence or absence of disease.   Orders Only on 01/31/2024  Component Date Value Ref Range Status   TSH 01/31/2024 3.580  0.450 - 4.500 uIU/mL Final   Cholesterol, Total 01/31/2024 86 (L)  100 - 199 mg/dL Final   Triglycerides 94/97/7974 65  0 - 149 mg/dL Final   HDL 94/97/7974 33 (L)  >39 mg/dL Final   VLDL Cholesterol Cal 01/31/2024 15  5 - 40 mg/dL Final   LDL Chol Calc (NIH) 01/31/2024 38  0 - 99 mg/dL Final   Chol/HDL Ratio 01/31/2024 2.6  0.0 - 5.0 ratio Final   Comment:                                   T. Chol/HDL Ratio                                             Men  Women                               1/2 Avg.Risk  3.4    3.3  Avg.Risk  5.0    4.4                                2X Avg.Risk  9.6    7.1                                3X Avg.Risk 23.4   11.0    WBC 01/31/2024 7.7  3.4 - 10.8 x10E3/uL Final   RBC 01/31/2024 4.33  4.14 - 5.80 x10E6/uL Final   Hemoglobin 01/31/2024 13.1  13.0 - 17.7 g/dL Final   Hematocrit 94/97/7974 39.9  37.5 - 51.0 % Final   MCV 01/31/2024 92  79 - 97 fL Final   MCH 01/31/2024 30.3  26.6 - 33.0 pg Final   MCHC 01/31/2024 32.8  31.5 - 35.7 g/dL Final   RDW 94/97/7974 13.1  11.6 - 15.4 % Final   Platelets 01/31/2024 217  150 - 450 x10E3/uL Final   Glucose 01/31/2024 84  70 - 99 mg/dL Final   BUN 94/97/7974 13  8 - 27 mg/dL Final   Creatinine, Ser 01/31/2024 1.02  0.76 - 1.27 mg/dL Final   eGFR 94/97/7974 76  >59 mL/min/1.73 Final   BUN/Creatinine Ratio 01/31/2024 13  10 - 24 Final   Sodium 01/31/2024 144  134 - 144 mmol/L Final   Potassium 01/31/2024 3.7  3.5 - 5.2 mmol/L Final   Chloride 01/31/2024 106  96 - 106 mmol/L Final   CO2 01/31/2024 23  20 - 29 mmol/L Final   Calcium  01/31/2024 8.9  8.6 - 10.2 mg/dL Final   Total Protein 94/97/7974 6.3  6.0 - 8.5 g/dL Final   Albumin 94/97/7974 3.8  3.8  - 4.8 g/dL Final   Globulin, Total 01/31/2024 2.5  1.5 - 4.5 g/dL Final   Bilirubin Total 01/31/2024 0.5  0.0 - 1.2 mg/dL Final   Alkaline Phosphatase 01/31/2024 122 (H)  44 - 121 IU/L Final   AST 01/31/2024 20  0 - 40 IU/L Final   ALT 01/31/2024 13  0 - 44 IU/L Final    Past Medical History:  Diagnosis Date   Actinic keratosis 12/26/2023   L zygoma, [facility]   AK (actinic keratosis) 12/26/2023   right lateral cheek, [facility]   Anxiety    Arthritis    Basal cell carcinoma 06/28/2015   left temple - excised   Basal cell carcinoma 08/05/2017   right cheek inf to zygoma   Basal cell carcinoma 05/09/2020   right prox mandible   CAD (coronary artery disease)    COPD (chronic obstructive pulmonary disease) (HCC)    Depression    Elevated PSA    Erectile dysfunction    GERD (gastroesophageal reflux disease)    Hyperlipidemia    Mitral valve regurgitation    OSA treated with BiPAP    Pneumonia    Pneumothorax    ultimatley required talc  pleurodiesis (2021)   PONV (postoperative nausea and vomiting)    Post traumatic stress disorder (PTSD)    RLS (restless legs syndrome)    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   right temple : sccis - shave   Tubular adenoma of colon    Past Surgical History:  Procedure Laterality Date   ANKLE SURGERY     BIOPSY  05/08/2022   Procedure: BIOPSY;  Surgeon: Eartha Angelia Sieving, MD;  Location: [facility];  Service: Gastroenterology;;   CHOLECYSTECTOMY     CHOLECYSTECTOMY     COLONOSCOPY WITH PROPOFOL  N/A 05/08/2022   Procedure: COLONOSCOPY WITH PROPOFOL ;  Surgeon: Eartha Angelia Sieving, MD;  Location: [facility];  Service: Gastroenterology;  Laterality: N/A;  730 ASA 2   ESOPHAGOGASTRODUODENOSCOPY (EGD) WITH PROPOFOL  N/A 05/08/2022   Procedure: ESOPHAGOGASTRODUODENOSCOPY (EGD) WITH PROPOFOL ;  Surgeon: Eartha Angelia Sieving, MD;  Location: [facility];  Service: Gastroenterology;  Laterality: N/A;   hydrocele surgery      LEFT  HEART CATH AND CORONARY ANGIOGRAPHY N/A 02/13/2019   Procedure: LEFT HEART CATH AND CORONARY ANGIOGRAPHY;  Surgeon: Burnard Debby LABOR, MD;  Location: [facility];  Service: Cardiovascular;  Laterality: N/A;   MOHS SURGERY     POLYPECTOMY  05/08/2022   Procedure: POLYPECTOMY;  Surgeon: Eartha Angelia Sieving, MD;  Location: [facility];  Service: Gastroenterology;;   REVERSE SHOULDER ARTHROPLASTY Right 06/07/2023   Procedure: REVERSE SHOULDER ARTHROPLASTY;  Surgeon: Kay Kemps, MD;  Location: [facility];  Service: Orthopedics;  Laterality: Right;   Current Outpatient Medications on File Prior to Visit  Medication Sig Dispense Refill   acetaminophen  (TYLENOL ) 500 MG tablet Take 500-1,000 mg by mouth every 6 (six) hours as needed for moderate pain.     Alpha-D-Galactosidase (BEANO) TABS Take 3 tablets by mouth at bedtime.     amLODipine  (NORVASC ) 2.5 MG tablet Take 1 tablet (2.5 mg total) by mouth every evening. 90 tablet 3   aspirin  EC 81 MG tablet Take 1 tablet (81 mg total) by mouth daily. 90 tablet 3   atorvastatin  (LIPITOR ) 80 MG tablet Take 1 tablet (80 mg total) by mouth daily. 90 tablet 3   benzonatate  (TESSALON ) 100 MG capsule Take 100 mg by mouth 3 (three) times daily as needed for cough.     carbidopa -levodopa  (SINEMET  CR) 50-200 MG tablet Take 1 tablet by mouth at bedtime. (Patient not taking: Reported on 03/19/2024)     chlorpheniramine (CHLOR-TRIMETON) 4 MG tablet Take 8 mg by mouth at bedtime.     cyanocobalamin  (VITAMIN B12) 1000 MCG/ML injection Inject 1,000 mcg into the muscle every 30 (thirty) days.     finasteride  (PROSCAR ) 5 MG tablet Take 1 tablet (5 mg total) by mouth daily. 90 tablet 3   hydrochlorothiazide  (MICROZIDE ) 12.5 MG capsule Take 1 capsule (12.5 mg total) by mouth daily as needed (fluid/swelling). 90 capsule 3   hydrocortisone  2.5 % cream Apply topically as directed. Apply to scaly rash on face hs on Tuesday, Thursday, and Saturday 30 g 6   ketoconazole   (NIZORAL ) 2 % cream Apply 1 application. topically daily. Qhs to feet for 1 month, Apply to scaly areas on face 3 times weekly, Monday, Wednesday and Friday 60 g 11   meclizine  (ANTIVERT ) 25 MG tablet Take 25 mg by mouth 3 (three) times daily as needed for dizziness.     Melatonin 5 MG TABS Take 10 mg by mouth at bedtime.      metoprolol  tartrate (LOPRESSOR ) 25 MG tablet Take 1 tablet (25 mg total) by mouth 2 (two) times daily. 180 tablet 0   mometasone  (ELOCON ) 0.1 % cream Apply 1 Application topically daily. Apply to affected area of back up to 5 days per week prn 45 g 3   pantoprazole  (PROTONIX ) 40 MG tablet Take 1 tablet (40 mg total) by mouth 2 (two) times daily. 180 tablet 2   prednisoLONE sodium phosphate  (INFLAMASE FORTE) 1 % ophthalmic solution 1 drop 4 (four) times daily.  tadalafil  (CIALIS ) 5 MG tablet Take 1 tablet (5 mg total) by mouth daily. 90 tablet 1   terbinafine  (LAMISIL ) 250 MG tablet Take 1 tablet (250 mg total) by mouth daily. 30 tablet 1   tiZANidine  (ZANAFLEX ) 4 MG tablet Take 1 tablet (4 mg total) by mouth every 8 (eight) hours as needed for muscle spasms. 20 tablet 0   traMADol  (ULTRAM ) 50 MG tablet Take 1 tablet (50 mg total) by mouth every 6 (six) hours as needed. 20 tablet 0   valACYclovir  (VALTREX ) 1000 MG tablet Take 1,000 mg by mouth daily as needed (outbreak).     VITAMIN D -VITAMIN K  PO Take 1 tablet by mouth daily.     No current facility-administered medications on file prior to visit.   Allergies  Allergen Reactions   Tape     Pt allergic to tape used for CT insertion    Crestor [Rosuvastatin] Rash and Other (See Comments)    Did not feel well    Social History   Socioeconomic History   Marital status: Married    Spouse name: Not on file   Number of children: Not on file   Years of education: Not on file   Highest education level: Not on file  Occupational History   Occupation: RETIRED  Tobacco Use   Smoking status: Former    Current  packs/day: 0.00    Average packs/day: 0.5 packs/day for 15.0 years (7.5 ttl pk-yrs)    Types: Cigarettes    Start date: 10/01/1973    Quit date: 10/01/1988    Years since quitting: 35.5    Passive exposure: Past   Smokeless tobacco: Never  Vaping Use   Vaping status: Never Used  Substance and Sexual Activity   Alcohol use: No    Alcohol/week: 0.0 standard drinks of alcohol   Drug use: No   Sexual activity: Yes    Comment: married, 2 grown sons, retired maintenance man  Other Topics Concern   Not on file  Social History Narrative   Not on file   Social Drivers of Health   Financial Resource Strain: Low Risk  (03/12/2024)   Overall Financial Resource Strain (CARDIA)    Difficulty of Paying Living Expenses: Not hard at all  Food Insecurity: No Food Insecurity (03/12/2024)   Hunger Vital Sign    Worried About Running Out of Food in the Last Year: Never true    Ran Out of Food in the Last Year: Never true  Transportation Needs: No Transportation Needs (03/12/2024)   PRAPARE - Administrator, Civil Service (Medical): No    Lack of Transportation (Non-Medical): No  Physical Activity: Inactive (03/12/2024)   Exercise Vital Sign    Days of Exercise per Week: 0 days    Minutes of Exercise per Session: 0 min  Stress: No Stress Concern Present (03/12/2024)   [employer] of Occupational Health - Occupational Stress Questionnaire    Feeling of Stress: Only a little  Social Connections: Moderately Isolated (03/12/2024)   Social Connection and Isolation Panel    Frequency of Communication with Friends and Family: Once a week    Frequency of Social Gatherings with Friends and Family: Never    Attends Religious Services: More than 4 times per year    Active Member of [employer] or Organizations: No    Attends Banker Meetings: Never    Marital Status: Married  Catering manager Violence: Not At Risk (03/12/2024)   Humiliation, Afraid, Rape, and Kick  questionnaire    Fear  of Current or Ex-Partner: No    Emotionally Abused: No    Physically Abused: No    Sexually Abused: No   Family History  Problem Relation Age of Onset   Diabetes Other    Breast cancer Other    Coronary artery disease Other       Review of Systems  All other systems reviewed and are negative.      Objective:   Physical Exam Vitals reviewed.  Constitutional:      General: He is not in acute distress.    Appearance: He is well-developed. He is not diaphoretic.  HENT:     Head: Normocephalic and atraumatic.     Right Ear: External ear normal.     Left Ear: External ear normal.     Nose: Nose normal.     Mouth/Throat:     Pharynx: No oropharyngeal exudate.  Eyes:     General: No scleral icterus.       Right eye: No discharge.        Left eye: No discharge.     Conjunctiva/sclera: Conjunctivae normal.     Pupils: Pupils are equal, round, and reactive to light.  Neck:     Thyroid : No thyromegaly.     Vascular: No JVD.     Trachea: No tracheal deviation.  Cardiovascular:     Rate and Rhythm: Regular rhythm. Bradycardia present.     Heart sounds: Normal heart sounds. No murmur heard.    No friction rub. No gallop.  Pulmonary:     Effort: Pulmonary effort is normal. No respiratory distress.     Breath sounds: Normal breath sounds. No stridor. No wheezing or rales.  Chest:     Chest wall: No tenderness.  Abdominal:     General: Bowel sounds are normal. There is no distension.     Palpations: Abdomen is soft. There is no mass.     Tenderness: There is no abdominal tenderness. There is no guarding or rebound.  Musculoskeletal:        General: No tenderness. Normal range of motion.     Cervical back: Normal range of motion and neck supple.     Right lower leg: Edema present.     Left lower leg: Edema present.  Lymphadenopathy:     Cervical: No cervical adenopathy.  Skin:    General: Skin is warm.     Coloration: Skin is not pale.     Findings: No erythema or rash.   Neurological:     Mental Status: He is alert and oriented to person, place, and time.     Cranial Nerves: No cranial nerve deficit.     Motor: No abnormal muscle tone.     Coordination: Coordination normal.     Deep Tendon Reflexes: Reflexes are normal and symmetric.  Psychiatric:        Behavior: Behavior normal.        Thought Content: Thought content normal.        Judgment: Judgment normal.           Assessment & Plan:  Encounter for Medicare annual wellness exam  Routine general medical examination at a health care facility  Essential hypertension  Benign prostatic hyperplasia without lower urinary tract symptoms  Coronary artery disease involving native coronary artery without angina pectoris, unspecified whether native or transplanted heart  Hyperlipidemia, unspecified hyperlipidemia type - Plan: Comprehensive metabolic panel with GFR The patient's LDL cholesterol is less  than 40.  I recommended that he discuss with his cardiologist whether they wanted to decrease his statin to 40 mg a day.  He has significant bradycardia and this may be contributing to his fatigue.  I have recommended that he reduce his metoprolol  to 12.5 mg twice daily.  He denies any reflux or LPR.  I recommended he reduce pantoprazole  to 40 mg a day.  We discussed Capvaxive.  He is also due for a tetanus shot.  Colonoscopy is due next year.  His PSA is excellent

## 2024-04-27 ENCOUNTER — Ambulatory Visit: Payer: Self-pay | Admitting: Family Medicine

## 2024-05-10 NOTE — Addendum Note (Signed)
 Addended by: DUANNE LOWERS T on: 05/10/2024 10:41 PM   Modules accepted: Level of Service

## 2024-06-04 ENCOUNTER — Ambulatory Visit: Admitting: Cardiology

## 2024-06-10 ENCOUNTER — Ambulatory Visit: Admitting: Podiatry

## 2024-06-24 ENCOUNTER — Encounter: Payer: Self-pay | Admitting: Podiatry

## 2024-06-24 ENCOUNTER — Ambulatory Visit (INDEPENDENT_AMBULATORY_CARE_PROVIDER_SITE_OTHER): Admitting: Podiatry

## 2024-06-24 DIAGNOSIS — B351 Tinea unguium: Secondary | ICD-10-CM | POA: Diagnosis not present

## 2024-06-24 DIAGNOSIS — M79674 Pain in right toe(s): Secondary | ICD-10-CM | POA: Diagnosis not present

## 2024-06-24 DIAGNOSIS — L608 Other nail disorders: Secondary | ICD-10-CM

## 2024-06-24 DIAGNOSIS — M79675 Pain in left toe(s): Secondary | ICD-10-CM | POA: Diagnosis not present

## 2024-06-24 NOTE — Progress Notes (Signed)
This patient presents to the office with chief complaint of long thick painful nails.  Patient says the nails are painful walking and wearing shoes.  This patient is unable to self treat.  This patient is unable to trim his  nails since she is unable to reach his nails.  She presents to the office for preventative foot care services.  General Appearance  Alert, conversant and in no acute stress.  Vascular  Dorsalis pedis and posterior tibial  pulses are absent   bilaterally.  Capillary return is within normal limits  bilaterally. Temperature is within normal limits  bilaterally.  Neurologic  Senn-Weinstein monofilament wire test within normal limits  bilaterally. Muscle power within normal limits bilaterally.  Nails Thick disfigured discolored nails with subungual debris hallux nails . No evidence of bacterial infection or drainage bilaterally.  Orthopedic  No limitations of motion  feet .  No crepitus or effusions noted.  No bony pathology or digital deformities noted. Mild HAV  B/L.  Hammer toes with deviated toe 3,4  B/L.  Prominent metsatarsal heads.  Cavus foot type  B/L.  Skin  normotropic skin with no porokeratosis noted bilaterally.  No signs of infections or ulcers noted.     Onychomycosis  Nails  B/L.  Pain in right toes  Pain in left toes  Debridement of nails both feet followed trimming the nails with dremel tool.    RTC 3 months.   Eve Rey DPM   

## 2024-07-08 DIAGNOSIS — G2581 Restless legs syndrome: Secondary | ICD-10-CM | POA: Diagnosis not present

## 2024-07-08 DIAGNOSIS — R29898 Other symptoms and signs involving the musculoskeletal system: Secondary | ICD-10-CM | POA: Diagnosis not present

## 2024-07-08 DIAGNOSIS — G609 Hereditary and idiopathic neuropathy, unspecified: Secondary | ICD-10-CM | POA: Diagnosis not present

## 2024-07-08 DIAGNOSIS — M25512 Pain in left shoulder: Secondary | ICD-10-CM | POA: Diagnosis not present

## 2024-07-08 DIAGNOSIS — Z96611 Presence of right artificial shoulder joint: Secondary | ICD-10-CM | POA: Diagnosis not present

## 2024-07-08 DIAGNOSIS — R2689 Other abnormalities of gait and mobility: Secondary | ICD-10-CM | POA: Diagnosis not present

## 2024-07-13 ENCOUNTER — Telehealth: Payer: Self-pay

## 2024-07-13 ENCOUNTER — Other Ambulatory Visit: Payer: Self-pay

## 2024-07-13 MED ORDER — METOPROLOL TARTRATE 25 MG PO TABS: 25.0000 mg | ORAL_TABLET | Freq: Two times a day (BID) | ORAL | 0 refills | Status: DC

## 2024-07-13 NOTE — Telephone Encounter (Signed)
 Sent in medication

## 2024-07-13 NOTE — Telephone Encounter (Signed)
 Prescription Request  07/13/2024  LOV: 04/24/24 cpe  What is the name of the medication or equipment? metoprolol  tartrate (LOPRESSOR ) 25 MG tablet [[id number]]   Have you contacted your pharmacy to request a refill? Yes   Which pharmacy would you like this sent to?  [id number] [place] [address] [place] [address] Phone: [phone] Fax: [phone]    Patient notified that their request is being sent to the clinical staff for review and that they should receive a response within 2 business days.   Please advise at [facility] [phone]

## 2024-07-29 ENCOUNTER — Other Ambulatory Visit: Payer: Self-pay | Admitting: Family Medicine

## 2024-07-29 DIAGNOSIS — N400 Enlarged prostate without lower urinary tract symptoms: Secondary | ICD-10-CM

## 2024-07-29 DIAGNOSIS — N529 Male erectile dysfunction, unspecified: Secondary | ICD-10-CM

## 2024-07-29 DIAGNOSIS — I10 Essential (primary) hypertension: Secondary | ICD-10-CM

## 2024-08-18 ENCOUNTER — Other Ambulatory Visit: Payer: Self-pay | Admitting: Family Medicine

## 2024-08-18 DIAGNOSIS — I10 Essential (primary) hypertension: Secondary | ICD-10-CM

## 2024-08-18 DIAGNOSIS — N400 Enlarged prostate without lower urinary tract symptoms: Secondary | ICD-10-CM

## 2024-08-18 DIAGNOSIS — N529 Male erectile dysfunction, unspecified: Secondary | ICD-10-CM

## 2024-08-20 NOTE — Telephone Encounter (Signed)
 Requested Prescriptions  Refused Prescriptions Disp Refills   tadalafil  (CIALIS ) 5 MG tablet [Pharmacy Med Name: TADALAFIL  5 MG TAB (GEN CIALIS )[*]] 90 tablet 1    Sig: TAKE ONE TABLET BY MOUTH ONE TIME DAILY     Urology: Erectile Dysfunction Agents Passed - 08/20/2024  5:07 PM      Passed - AST in normal range and within 360 days    AST  Date Value Ref Range Status  04/24/2024 16 10 - 35 U/L Final         Passed - ALT in normal range and within 360 days    ALT  Date Value Ref Range Status  04/24/2024 9 9 - 46 U/L Final         Passed - Last BP in normal range    BP Readings from Last 1 Encounters:  04/24/24 126/62         Passed - Valid encounter within last 12 months    Recent Outpatient Visits           3 months ago Encounter for Medicare annual wellness exam   [facility] Duanne Butler DASEN, MD   1 year ago Fatigue, unspecified type   [facility] Pickard, Butler DASEN, MD       Future Appointments             In 1 month Anner Alm ORN, MD [facility], [facility]   In 4 months Jackquline Sawyer, MD [facility]

## 2024-09-30 ENCOUNTER — Encounter: Payer: Self-pay | Admitting: Podiatry

## 2024-09-30 ENCOUNTER — Ambulatory Visit (INDEPENDENT_AMBULATORY_CARE_PROVIDER_SITE_OTHER): Admitting: Podiatry

## 2024-09-30 VITALS — Ht 75.5 in | Wt 231.0 lb

## 2024-09-30 DIAGNOSIS — M79675 Pain in left toe(s): Secondary | ICD-10-CM

## 2024-09-30 DIAGNOSIS — B351 Tinea unguium: Secondary | ICD-10-CM | POA: Diagnosis not present

## 2024-09-30 DIAGNOSIS — M79674 Pain in right toe(s): Secondary | ICD-10-CM | POA: Diagnosis not present

## 2024-09-30 DIAGNOSIS — L608 Other nail disorders: Secondary | ICD-10-CM

## 2024-09-30 NOTE — Progress Notes (Signed)
 This patient presents to the office with chief complaint of long thick painful nails.  Patient says the nails are painful walking and wearing shoes.  This patient is unable to self treat.  This patient is unable to trim his  nails since she is unable to reach his nails.  he presents to the office for preventative foot care services.  General Appearance  Alert, conversant and in no acute stress.  Vascular  Dorsalis pedis and posterior tibial  pulses are absent   bilaterally.  Capillary return is within normal limits  bilaterally. Temperature is within normal limits  bilaterally.  Neurologic  Senn-Weinstein monofilament wire test within normal limits  bilaterally. Muscle power within normal limits bilaterally.  Nails Thick disfigured discolored nails with subungual debris hallux nails . No evidence of bacterial infection or drainage bilaterally.  Orthopedic  No limitations of motion  feet .  No crepitus or effusions noted.  No bony pathology or digital deformities noted. Mild HAV  B/L.  Hammer toes with deviated toe 3,4  B/L.  Prominent metsatarsal heads.  Cavus foot type  B/L.  Skin  normotropic skin with no porokeratosis noted bilaterally.  No signs of infections or ulcers noted.     Onychomycosis  Nails  B/L.  Pain in right toes  Pain in left toes  Debridement of nails both feet followed trimming the nails with dremel tool.    RTC 3 months.   Cordella Bold DPM

## 2024-10-06 ENCOUNTER — Ambulatory Visit: Attending: Cardiology | Admitting: Cardiology

## 2024-10-06 ENCOUNTER — Encounter: Payer: Self-pay | Admitting: Cardiology

## 2024-10-06 VITALS — BP 122/64 | HR 71 | Ht 75.0 in | Wt 237.0 lb

## 2024-10-06 DIAGNOSIS — G4733 Obstructive sleep apnea (adult) (pediatric): Secondary | ICD-10-CM | POA: Diagnosis present

## 2024-10-06 DIAGNOSIS — E785 Hyperlipidemia, unspecified: Secondary | ICD-10-CM | POA: Insufficient documentation

## 2024-10-06 DIAGNOSIS — J9383 Other pneumothorax: Secondary | ICD-10-CM | POA: Insufficient documentation

## 2024-10-06 DIAGNOSIS — I2510 Atherosclerotic heart disease of native coronary artery without angina pectoris: Secondary | ICD-10-CM | POA: Diagnosis present

## 2024-10-06 DIAGNOSIS — I10 Essential (primary) hypertension: Secondary | ICD-10-CM | POA: Insufficient documentation

## 2024-10-06 DIAGNOSIS — E782 Mixed hyperlipidemia: Secondary | ICD-10-CM | POA: Insufficient documentation

## 2024-10-06 DIAGNOSIS — I890 Lymphedema, not elsewhere classified: Secondary | ICD-10-CM | POA: Insufficient documentation

## 2024-10-06 MED ORDER — METOPROLOL TARTRATE 25 MG PO TABS: 12.5000 mg | ORAL_TABLET | Freq: Two times a day (BID) | ORAL | Status: AC

## 2024-10-06 NOTE — Patient Instructions (Addendum)
 Medication Instructions:     Decrease Lopressor  ( Metoprolol   tartrate ) to 12. 5 mg  twice a day -- take for month  let us  know how your fatigue is  better or worse?   *If you need a refill on your cardiac medications before your next appointment, please call your pharmacy*   Lab Work: Not needed    Testing/Procedures:  Not needed  Follow-Up: At [facility], you and your health needs are our priority.  As part of our continuing mission to provide you with exceptional heart care, we have created designated Provider Care Teams.  These Care Teams include your primary Cardiologist (physician) and Advanced Practice Providers (APPs -  Physician Assistants and Nurse Practitioners) who all work together to provide you with the care you need, when you need it.     Your next appointment:   12 month(s)  The format for your next appointment:   In Person  Provider:   Alm Clay, MD

## 2024-10-06 NOTE — Progress Notes (Signed)
 " Cardiology Office Note:  .   Date:  10/11/2024  ID:  Gregory Suarez, DOB 1946-04-25, MRN [id number] PCP: Duanne Butler DASEN, MD  [facility] Providers Cardiologist:  Alm Clay, MD Sleep Medicine:  Debby Sor, MD (Inactive)     Chief Complaint  Patient presents with   Follow-up    Establish with New Cardiologist   Patient Profile: .     Gregory Suarez is a borderline obese 79 y.o. male with a PMH notable for HTN, HLD OSA who presents here for ~8 month f/u to establish new Cardiologist after Dr. Joesphine retirement at the request of Duanne Butler DASEN, MD.  PMH: OSA on BiPAP HTN HLD History of spontaneous pneumothorax Coronary CT Angiogram -[facility] 2264: => Cath May 2020 diffuse 50-60% proximal LAD, 50 % D1 with mild distal LAD disease, smooth 60% mid-distal OM with mild irregularities in the RCA.  EF 55%. Chronic BLE Edema with Lymphedema: Uses intermittent SCD's     Gregory Suarez was last seen by Dr. Sor on Feb 04, 2024 prior to his retirement.  No active cardiac symptoms.  BP stable.  Lipids well-controlled with LDL 38 as of May 2025.  He was most recently seen by Gregory Bihari, MD. he now presents for primary cardiology follow-up in the absence of any active cardiac issues.  Subjective  Discussed the use of AI scribe software for clinical note transcription with the patient, who gave verbal consent to proceed.  History of Present Illness Gregory Suarez is a 79 year old male with chronic lymphedema who presents with low energy levels.  He has been experiencing low energy levels without associated chest pain or shortness of breath. His chronic lymphedema is managed with sequential compression devices (SCDs).  He is currently taking 25 mg of an unspecified antihypertensive medication twice daily. Additionally, he is on 2.5 mg of amlodipine , which will remain unchanged. He also has a history of PRN and HCV.  Cardiovascular ROS: no chest pain or dyspnea on exertion positive  for - chronic with stable edema; poor energy levels. negative for - irregular heartbeat, orthopnea, palpitations, paroxysmal nocturnal dyspnea, rapid heart rate, shortness of breath, or syncope or near syncope MI TIA or amaurosis fugax, claudication  ROS:  Review of Systems - Negative except symptoms noted above, significant arthritis pains in his shoulders hips and knees as well as back.  GERD well-controlled.    Objective   Pertinent CV Medications: Amlodipine  2.5 mg daily; Lopressor  25 mg twice daily; as needed HCTZ 12.5 mg for edema Aspirin  81 mg daily Lipitor  10 mg daily  Studies Reviewed: SABRA   EKG Interpretation Date/Time:  Tuesday October 06 2024 11:15:05 EST Ventricular Rate:  47 PR Interval:  202 QRS Duration:  124 QT Interval:  476 QTC Calculation: 421 R Axis:   -48  Text Interpretation: Sinus bradycardia with Premature supraventricular complexes Left anterior fascicular block Left ventricular hypertrophy with QRS widening ( R in aVL , Cornell product ) When compared with ECG of 04-Feb-2024 13:52, Premature supraventricular complexes are now Present Confirmed by Clay Alm ([id number]) on 10/06/2024 11:49:31 AM     Lab Results  Component Value Date   CHOL 86 (L) 01/31/2024   HDL 33 (L) 01/31/2024   LDLCALC 38 01/31/2024   TRIG 65 01/31/2024   CHOLHDL 2.6 01/31/2024   No results found for: HGBA1C Lab Results  Component Value Date   NA 140 04/24/2024   K 4.3 04/24/2024   CREATININE 0.88 04/24/2024   EGFR 89  04/24/2024   GLUCOSE 78 04/24/2024   Recent Cardiac Studies Echocardiogram 09/10/2016: LVEF 55 to 60%.  No RWMA.  Normal atrial sizes.  Normal RV size and function.  Normal RV.  RAP.  Normal valves.  CATH 02/13/2019: Dom RCA. Prox RCA & prox-mid RCA 20%, mid RCA 20%. Om1 60%, Prox LAD 55% & D1 50%.  LVEF 50-60%.   NM Stress PET 04/09/2023: LOW RISK.  Normal perfusion.  No ischemia or infarction.  Rest EF 57%, stress EF 62%.   Risk Assessment/Calculations:           Physical Exam:   VS:  BP 122/64   Pulse 71   Ht 6' 3 (1.905 m)   Wt 237 lb (107.5 kg)   SpO2 96%   BMI 29.62 kg/m    Wt Readings from Last 3 Encounters:  10/06/24 237 lb (107.5 kg)  09/30/24 231 lb (104.8 kg)  04/24/24 231 lb (104.8 kg)      GEN: Well nourished, well groomed; in no acute distress; borderline obese but otherwise healthy appearing NECK: No JVD; No carotid bruits CARDIAC: Normal S1, S2; RRR, no murmurs, rubs, gallops RESPIRATORY:  Clear to auscultation without rales, wheezing or rhonchi ; nonlabored, good air movement. ABDOMEN: Soft, non-tender, non-distended EXTREMITIES:  No edema; No deformity     ASSESSMENT AND PLAN: .    Coronary artery disease involving native coronary artery of native heart without angina pectoris Assessment & Plan: Significant coronary calcification, but nonocclusive CAD by cath in 2020 followed up by nonischemic stress PET in 2024 for preop assessment.   Continue aggressive CRF modification.  Orders: -     EKG 12-Lead  Coronary artery disease, non-occlusive Assessment & Plan: Significant coronary calcification, but nonocclusive CAD by cath in 2020 followed up by nonischemic stress PET in 2024 for preop assessment.   Continue aggressive CRF modification.   Primary hypertension Assessment & Plan: Low energy noted, no acute issues. - Reduce low pressure medication to 12.5 mg bid for one month. - If energy improves, change to Toprol  12.5 mg daily. - Continue amlodipine  2.5 mg daily.   Hyperlipidemia LDL goal <70 Assessment & Plan: Most recent LDL 38 on current dose of statin. - Continue atorvastatin  80 mg   Spontaneous pneumothorax Assessment & Plan: Status post talc  placement.  Has not had issues since.   OSA treated with BiPAP Assessment & Plan: Doing well on BiPAP.  Followed by Dr. Shlomo.   Chronic acquired lymphedema Assessment & Plan: Managed with sequential compression devices. - Continue use of  SCDs. Rare use of PRN HCTZ 12.5 mg   Other orders -     Metoprolol  Tartrate; Take 0.5 tablets (12.5 mg total) by mouth 2 (two) times daily.       Follow-Up: Return in about 1 year (around 10/06/2025) for [employer].     Signed, Alm MICAEL Clay, MD, MS Alm Clay, M.D., M.S. Interventional Cardiologist  [facility] Pager # [phone]      "

## 2024-10-11 ENCOUNTER — Encounter: Payer: Self-pay | Admitting: Cardiology

## 2024-10-11 DIAGNOSIS — I890 Lymphedema, not elsewhere classified: Secondary | ICD-10-CM | POA: Insufficient documentation

## 2024-10-11 NOTE — Assessment & Plan Note (Signed)
 Significant coronary calcification, but nonocclusive CAD by cath in 2020 followed up by nonischemic stress PET in 2024 for preop assessment.   Continue aggressive CRF modification.

## 2024-10-11 NOTE — Assessment & Plan Note (Signed)
 Low energy noted, no acute issues. - Reduce low pressure medication to 12.5 mg bid for one month. - If energy improves, change to Toprol  12.5 mg daily. - Continue amlodipine  2.5 mg daily.

## 2024-10-11 NOTE — Assessment & Plan Note (Signed)
 No active angina.  Cath results reviewed as well as recent Stress PET. Continue with CRF modification: - Continue amlodipine  2.5 mg daily along with reduced dose of metoprolol  to 12.5 mg twice daily (plan to potentially convert to Toprol  12.5 mg daily) - Continue high-dose atorvastatin  with well-controlled lipids - Continue Aspir 81 milligram daily for prophylaxis  Okay to hold aspirin  5 to 7 days preop for surgeries or procedures.

## 2024-10-11 NOTE — Assessment & Plan Note (Signed)
 Status post talc  placement.  Has not had issues since.

## 2024-10-11 NOTE — Assessment & Plan Note (Signed)
 Managed with sequential compression devices. - Continue use of SCDs. Rare use of PRN HCTZ 12.5 mg

## 2024-10-11 NOTE — Assessment & Plan Note (Signed)
 Most recent LDL 38 on current dose of statin. - Continue atorvastatin  80 mg

## 2024-10-11 NOTE — Assessment & Plan Note (Signed)
 Doing well on BiPAP.  Followed by Dr. Shlomo.

## 2024-11-03 NOTE — Telephone Encounter (Signed)
 fyi

## 2024-12-21 ENCOUNTER — Ambulatory Visit: Admitting: Dermatology

## 2024-12-23 ENCOUNTER — Ambulatory Visit: Admitting: Dermatology

## 2024-12-30 ENCOUNTER — Ambulatory Visit: Admitting: Podiatry

## 2025-03-18 ENCOUNTER — Encounter

## 2025-04-23 ENCOUNTER — Other Ambulatory Visit

## 2025-04-30 ENCOUNTER — Encounter: Admitting: Family Medicine
